# Patient Record
Sex: Male | Born: 1935 | Race: White | Hispanic: No | Marital: Married | Smoking: Never smoker
Health system: Southern US, Community
[De-identification: ages and names within clinical notes are randomized; demographics above are authoritative.]

## PROBLEM LIST (undated history)

## (undated) DIAGNOSIS — R42 Dizziness and giddiness: Secondary | ICD-10-CM

## (undated) DIAGNOSIS — I7090 Unspecified atherosclerosis: Secondary | ICD-10-CM

## (undated) DIAGNOSIS — M4800 Spinal stenosis, site unspecified: Secondary | ICD-10-CM

## (undated) DIAGNOSIS — R739 Hyperglycemia, unspecified: Secondary | ICD-10-CM

## (undated) DIAGNOSIS — I701 Atherosclerosis of renal artery: Secondary | ICD-10-CM

## (undated) DIAGNOSIS — I779 Disorder of arteries and arterioles, unspecified: Secondary | ICD-10-CM

## (undated) DIAGNOSIS — N281 Cyst of kidney, acquired: Secondary | ICD-10-CM

## (undated) DIAGNOSIS — I2510 Atherosclerotic heart disease of native coronary artery without angina pectoris: Secondary | ICD-10-CM

## (undated) DIAGNOSIS — K432 Incisional hernia without obstruction or gangrene: Secondary | ICD-10-CM

## (undated) DIAGNOSIS — C61 Malignant neoplasm of prostate: Secondary | ICD-10-CM

## (undated) DIAGNOSIS — K219 Gastro-esophageal reflux disease without esophagitis: Secondary | ICD-10-CM

## (undated) DIAGNOSIS — I10 Essential (primary) hypertension: Secondary | ICD-10-CM

## (undated) DIAGNOSIS — R911 Solitary pulmonary nodule: Secondary | ICD-10-CM

## (undated) DIAGNOSIS — Z860100 Personal history of colon polyps, unspecified: Secondary | ICD-10-CM

## (undated) DIAGNOSIS — R059 Cough, unspecified: Secondary | ICD-10-CM

## (undated) DIAGNOSIS — K222 Esophageal obstruction: Secondary | ICD-10-CM

## (undated) DIAGNOSIS — R05 Cough: Secondary | ICD-10-CM

## (undated) DIAGNOSIS — L03119 Cellulitis of unspecified part of limb: Secondary | ICD-10-CM

## (undated) DIAGNOSIS — I739 Peripheral vascular disease, unspecified: Secondary | ICD-10-CM

## (undated) DIAGNOSIS — E785 Hyperlipidemia, unspecified: Secondary | ICD-10-CM

## (undated) DIAGNOSIS — R21 Rash and other nonspecific skin eruption: Secondary | ICD-10-CM

## (undated) DIAGNOSIS — K5790 Diverticulosis of intestine, part unspecified, without perforation or abscess without bleeding: Secondary | ICD-10-CM

## (undated) DIAGNOSIS — K769 Liver disease, unspecified: Secondary | ICD-10-CM

## (undated) DIAGNOSIS — IMO0001 Reserved for inherently not codable concepts without codable children: Secondary | ICD-10-CM

## (undated) DIAGNOSIS — I350 Nonrheumatic aortic (valve) stenosis: Secondary | ICD-10-CM

## (undated) DIAGNOSIS — E0590 Thyrotoxicosis, unspecified without thyrotoxic crisis or storm: Secondary | ICD-10-CM

## (undated) DIAGNOSIS — S35239A Unspecified injury of inferior mesenteric artery, initial encounter: Secondary | ICD-10-CM

## (undated) HISTORY — DX: Cough: R05

## (undated) HISTORY — DX: Gastro-esophageal reflux disease without esophagitis: K21.9

## (undated) HISTORY — DX: Hyperglycemia, unspecified: R73.9

## (undated) HISTORY — DX: Essential (primary) hypertension: I10

## (undated) HISTORY — DX: Disorder of arteries and arterioles, unspecified: I77.9

## (undated) HISTORY — DX: Hyperlipidemia, unspecified: E78.5

## (undated) HISTORY — PX: ESOPHAGOGASTRODUODENOSCOPY: SHX1529

## (undated) HISTORY — DX: Solitary pulmonary nodule: R91.1

## (undated) HISTORY — DX: Rash and other nonspecific skin eruption: R21

## (undated) HISTORY — DX: Spinal stenosis, site unspecified: M48.00

## (undated) HISTORY — DX: Unspecified atherosclerosis: I70.90

## (undated) HISTORY — DX: Cellulitis of unspecified part of limb: L03.119

## (undated) HISTORY — DX: Reserved for inherently not codable concepts without codable children: IMO0001

## (undated) HISTORY — PX: INCISIONAL HERNIA REPAIR: SHX193

## (undated) HISTORY — PX: SIGMOID RESECTION / RECTOPEXY: SUR1294

## (undated) HISTORY — DX: Malignant neoplasm of prostate: C61

## (undated) HISTORY — DX: Personal history of colon polyps, unspecified: Z86.0100

## (undated) HISTORY — PX: CARDIAC CATHETERIZATION: SHX172

## (undated) HISTORY — DX: Thyrotoxicosis, unspecified without thyrotoxic crisis or storm: E05.90

## (undated) HISTORY — DX: Cough, unspecified: R05.9

## (undated) HISTORY — DX: Incisional hernia without obstruction or gangrene: K43.2

## (undated) HISTORY — DX: Nonrheumatic aortic (valve) stenosis: I35.0

## (undated) HISTORY — DX: Dizziness and giddiness: R42

## (undated) HISTORY — DX: Personal history of colonic polyps: Z86.010

## (undated) HISTORY — DX: Atherosclerosis of renal artery: I70.1

## (undated) HISTORY — DX: Peripheral vascular disease, unspecified: I73.9

## (undated) HISTORY — DX: Cyst of kidney, acquired: N28.1

## (undated) HISTORY — DX: Liver disease, unspecified: K76.9

## (undated) HISTORY — DX: Unspecified injury of inferior mesenteric artery, initial encounter: S35.239A

## (undated) HISTORY — DX: Esophageal obstruction: K22.2

## (undated) HISTORY — DX: Diverticulosis of intestine, part unspecified, without perforation or abscess without bleeding: K57.90

## (undated) HISTORY — DX: Atherosclerotic heart disease of native coronary artery without angina pectoris: I25.10

---

## 1999-05-06 ENCOUNTER — Ambulatory Visit (HOSPITAL_COMMUNITY): Admission: RE | Admit: 1999-05-06 | Discharge: 1999-05-07 | Payer: Self-pay | Admitting: Cardiology

## 1999-10-19 ENCOUNTER — Encounter: Payer: Self-pay | Admitting: Endocrinology

## 1999-10-19 ENCOUNTER — Ambulatory Visit (HOSPITAL_COMMUNITY): Admission: RE | Admit: 1999-10-19 | Discharge: 1999-10-19 | Payer: Self-pay | Admitting: Endocrinology

## 1999-11-23 ENCOUNTER — Encounter: Payer: Self-pay | Admitting: Endocrinology

## 1999-11-23 ENCOUNTER — Ambulatory Visit (HOSPITAL_COMMUNITY): Admission: RE | Admit: 1999-11-23 | Discharge: 1999-11-23 | Payer: Self-pay | Admitting: Endocrinology

## 2000-09-19 ENCOUNTER — Ambulatory Visit (HOSPITAL_COMMUNITY): Admission: RE | Admit: 2000-09-19 | Discharge: 2000-09-19 | Payer: Self-pay | Admitting: Ophthalmology

## 2001-07-29 ENCOUNTER — Inpatient Hospital Stay (HOSPITAL_COMMUNITY): Admission: RE | Admit: 2001-07-29 | Discharge: 2001-07-30 | Payer: Self-pay | Admitting: General Surgery

## 2002-04-11 ENCOUNTER — Encounter: Payer: Self-pay | Admitting: *Deleted

## 2002-04-11 ENCOUNTER — Inpatient Hospital Stay (HOSPITAL_COMMUNITY): Admission: EM | Admit: 2002-04-11 | Discharge: 2002-04-12 | Payer: Self-pay | Admitting: *Deleted

## 2003-02-15 ENCOUNTER — Encounter: Payer: Self-pay | Admitting: Dentistry

## 2003-02-15 ENCOUNTER — Ambulatory Visit (HOSPITAL_COMMUNITY): Admission: RE | Admit: 2003-02-15 | Discharge: 2003-02-15 | Payer: Self-pay | Admitting: Dentistry

## 2003-04-02 ENCOUNTER — Inpatient Hospital Stay (HOSPITAL_COMMUNITY): Admission: AD | Admit: 2003-04-02 | Discharge: 2003-04-03 | Payer: Self-pay | Admitting: Internal Medicine

## 2003-05-29 ENCOUNTER — Encounter: Admission: RE | Admit: 2003-05-29 | Discharge: 2003-05-29 | Payer: Self-pay | Admitting: Dentistry

## 2003-06-03 ENCOUNTER — Encounter: Admission: RE | Admit: 2003-06-03 | Discharge: 2003-06-03 | Payer: Self-pay | Admitting: Dentistry

## 2003-06-12 ENCOUNTER — Encounter: Admission: RE | Admit: 2003-06-12 | Discharge: 2003-06-12 | Payer: Self-pay | Admitting: Dentistry

## 2003-06-25 ENCOUNTER — Ambulatory Visit (HOSPITAL_COMMUNITY): Admission: RE | Admit: 2003-06-25 | Discharge: 2003-06-25 | Payer: Self-pay | Admitting: Gastroenterology

## 2003-06-27 ENCOUNTER — Ambulatory Visit (HOSPITAL_COMMUNITY): Admission: RE | Admit: 2003-06-27 | Discharge: 2003-06-27 | Payer: Self-pay | Admitting: Gastroenterology

## 2004-05-26 ENCOUNTER — Ambulatory Visit: Payer: Self-pay | Admitting: Endocrinology

## 2004-06-04 ENCOUNTER — Ambulatory Visit: Payer: Self-pay | Admitting: Gastroenterology

## 2004-06-09 ENCOUNTER — Ambulatory Visit: Payer: Self-pay | Admitting: Endocrinology

## 2004-06-30 ENCOUNTER — Ambulatory Visit: Payer: Self-pay | Admitting: Gastroenterology

## 2004-07-28 ENCOUNTER — Ambulatory Visit: Payer: Self-pay | Admitting: Internal Medicine

## 2004-09-23 ENCOUNTER — Ambulatory Visit: Payer: Self-pay | Admitting: Gastroenterology

## 2004-09-23 ENCOUNTER — Ambulatory Visit (HOSPITAL_COMMUNITY): Admission: RE | Admit: 2004-09-23 | Discharge: 2004-09-23 | Payer: Self-pay | Admitting: Gastroenterology

## 2004-12-29 ENCOUNTER — Ambulatory Visit: Payer: Self-pay | Admitting: Gastroenterology

## 2005-01-28 ENCOUNTER — Ambulatory Visit: Payer: Self-pay | Admitting: Endocrinology

## 2005-03-04 ENCOUNTER — Ambulatory Visit: Payer: Self-pay | Admitting: Endocrinology

## 2005-03-16 ENCOUNTER — Ambulatory Visit: Payer: Self-pay | Admitting: Endocrinology

## 2005-04-06 ENCOUNTER — Ambulatory Visit: Payer: Self-pay | Admitting: Endocrinology

## 2005-04-30 ENCOUNTER — Ambulatory Visit: Payer: Self-pay | Admitting: Endocrinology

## 2005-05-20 ENCOUNTER — Ambulatory Visit: Payer: Self-pay | Admitting: Cardiology

## 2005-06-25 ENCOUNTER — Ambulatory Visit: Payer: Self-pay | Admitting: Gastroenterology

## 2005-07-13 ENCOUNTER — Ambulatory Visit: Payer: Self-pay

## 2005-07-13 ENCOUNTER — Ambulatory Visit: Payer: Self-pay | Admitting: Cardiovascular Disease

## 2005-08-13 ENCOUNTER — Ambulatory Visit: Payer: Self-pay | Admitting: Internal Medicine

## 2006-05-02 ENCOUNTER — Ambulatory Visit: Payer: Self-pay | Admitting: Cardiology

## 2006-05-09 ENCOUNTER — Ambulatory Visit: Payer: Self-pay | Admitting: Endocrinology

## 2006-05-23 ENCOUNTER — Ambulatory Visit: Payer: Self-pay | Admitting: Endocrinology

## 2006-05-23 LAB — CONVERTED CEMR LAB
ALT: 36 units/L (ref 0–40)
AST: 33 units/L (ref 0–37)
Albumin: 4.1 g/dL (ref 3.5–5.2)
Alkaline Phosphatase: 63 units/L (ref 39–117)
BUN: 25 mg/dL — ABNORMAL HIGH (ref 6–23)
Basophils Absolute: 0 10*3/uL (ref 0.0–0.1)
Basophils Relative: 0.6 % (ref 0.0–1.0)
Bilirubin Urine: NEGATIVE
CO2: 29 meq/L (ref 19–32)
Calcium: 10 mg/dL (ref 8.4–10.5)
Chloride: 106 meq/L (ref 96–112)
Chol/HDL Ratio, serum: 3.6
Cholesterol: 124 mg/dL (ref 0–200)
Creatinine, Ser: 1.3 mg/dL (ref 0.4–1.5)
Eosinophil percent: 2.2 % (ref 0.0–5.0)
GFR calc non Af Amer: 56 mL/min
Glomerular Filtration Rate, Af Am: 68 mL/min/{1.73_m2}
Glucose, Bld: 101 mg/dL — ABNORMAL HIGH (ref 70–99)
HCT: 49.5 % (ref 39.0–52.0)
HDL: 34.4 mg/dL — ABNORMAL LOW (ref >39.0)
Hemoglobin, Urine: NEGATIVE
Hemoglobin: 16.7 g/dL (ref 13.0–17.0)
Hgb A1c MFr Bld: 5.2 % (ref 4.6–6.0)
LDL Cholesterol: 62 mg/dL (ref 0–99)
Leukocytes, UA: NEGATIVE
Lymphocytes Relative: 29.6 % (ref 12.0–46.0)
MCHC: 33.8 g/dL (ref 30.0–36.0)
MCV: 97.8 fL (ref 78.0–100.0)
Monocytes Absolute: 0.5 10*3/uL (ref 0.2–0.7)
Monocytes Relative: 6.5 % (ref 3.0–11.0)
Neutro Abs: 4.4 10*3/uL (ref 1.4–7.7)
Neutrophils Relative %: 61.1 % (ref 43.0–77.0)
Nitrite: NEGATIVE
PSA: 28.2 ng/mL — ABNORMAL HIGH (ref 0.10–4.00)
Platelets: 173 10*3/uL (ref 150–400)
Potassium: 4.3 meq/L (ref 3.5–5.1)
RBC: 5.06 M/uL (ref 4.22–5.81)
RDW: 11.8 % (ref 11.5–14.6)
Sodium: 141 meq/L (ref 135–145)
Specific Gravity, Urine: 1.025 (ref 1.000–1.03)
TSH: 2.03 microintl units/mL (ref 0.35–5.50)
Total Bilirubin: 1.3 mg/dL — ABNORMAL HIGH (ref 0.3–1.2)
Total Protein, Urine: NEGATIVE mg/dL
Total Protein: 7.2 g/dL (ref 6.0–8.3)
Triglyceride fasting, serum: 137 mg/dL (ref 0–149)
Urine Glucose: NEGATIVE mg/dL
Urobilinogen, UA: 0.2 (ref 0.0–1.0)
VLDL: 27 mg/dL (ref 0–40)
WBC: 7.2 10*3/uL (ref 4.5–10.5)
pH: 6 (ref 5.0–8.0)

## 2006-08-09 ENCOUNTER — Ambulatory Visit: Payer: Self-pay | Admitting: Gastroenterology

## 2006-08-09 LAB — CONVERTED CEMR LAB
BUN: 17 mg/dL (ref 6–23)
Creatinine, Ser: 1.2 mg/dL (ref 0.4–1.5)

## 2006-08-10 ENCOUNTER — Ambulatory Visit (HOSPITAL_COMMUNITY): Admission: RE | Admit: 2006-08-10 | Discharge: 2006-08-10 | Payer: Self-pay | Admitting: Gastroenterology

## 2006-08-15 ENCOUNTER — Ambulatory Visit: Payer: Self-pay | Admitting: Cardiology

## 2006-11-21 ENCOUNTER — Ambulatory Visit: Payer: Self-pay | Admitting: Endocrinology

## 2006-11-21 LAB — CONVERTED CEMR LAB: PSA: 0.21 ng/mL (ref 0.10–4.00)

## 2006-12-05 ENCOUNTER — Ambulatory Visit: Payer: Self-pay | Admitting: Cardiology

## 2006-12-05 ENCOUNTER — Inpatient Hospital Stay (HOSPITAL_COMMUNITY): Admission: RE | Admit: 2006-12-05 | Discharge: 2006-12-06 | Payer: Self-pay | Admitting: Cardiology

## 2006-12-15 ENCOUNTER — Ambulatory Visit: Payer: Self-pay | Admitting: Cardiology

## 2007-01-10 ENCOUNTER — Ambulatory Visit: Payer: Self-pay | Admitting: Gastroenterology

## 2007-01-10 ENCOUNTER — Ambulatory Visit: Payer: Self-pay | Admitting: Oncology

## 2007-01-17 ENCOUNTER — Ambulatory Visit: Payer: Self-pay | Admitting: Gastroenterology

## 2007-01-24 LAB — CBC WITH DIFFERENTIAL/PLATELET
BASO%: 0.1 % (ref 0.0–2.0)
HCT: 36 % — ABNORMAL LOW (ref 38.7–49.9)
LYMPH%: 24.4 % (ref 14.0–48.0)
MCHC: 36.2 g/dL — ABNORMAL HIGH (ref 32.0–35.9)
MCV: 96.7 fL (ref 81.6–98.0)
MONO#: 0.5 10*3/uL (ref 0.1–0.9)
MONO%: 7 % (ref 0.0–13.0)
NEUT%: 62.1 % (ref 40.0–75.0)
Platelets: 187 10*3/uL (ref 145–400)
WBC: 7.1 10*3/uL (ref 4.0–10.0)

## 2007-01-24 LAB — COMPREHENSIVE METABOLIC PANEL
ALT: 26 U/L (ref 0–53)
AST: 27 U/L (ref 0–37)
Alkaline Phosphatase: 68 U/L (ref 39–117)
Potassium: 4.6 mEq/L (ref 3.5–5.3)
Sodium: 142 mEq/L (ref 135–145)
Total Bilirubin: 0.9 mg/dL (ref 0.3–1.2)
Total Protein: 7.3 g/dL (ref 6.0–8.3)

## 2007-01-31 ENCOUNTER — Ambulatory Visit: Payer: Self-pay | Admitting: Endocrinology

## 2007-01-31 LAB — CONVERTED CEMR LAB
Calcium, Total (PTH): 10 mg/dL (ref 8.4–10.5)
Hgb A1c MFr Bld: 5.1 % (ref 4.6–6.0)
PTH: 43.9 pg/mL (ref 14.0–72.0)
TSH: 1.21 microintl units/mL (ref 0.35–5.50)

## 2007-02-06 ENCOUNTER — Ambulatory Visit: Admission: RE | Admit: 2007-02-06 | Discharge: 2007-05-07 | Payer: Self-pay | Admitting: Radiation Oncology

## 2007-03-14 ENCOUNTER — Ambulatory Visit: Payer: Self-pay | Admitting: Cardiology

## 2007-03-30 ENCOUNTER — Ambulatory Visit: Payer: Self-pay | Admitting: Oncology

## 2007-04-03 LAB — PSA: PSA: 0.56 ng/mL (ref 0.10–4.00)

## 2007-04-03 LAB — CBC WITH DIFFERENTIAL/PLATELET
BASO%: 0.5 % (ref 0.0–2.0)
EOS%: 6.1 % (ref 0.0–7.0)
HCT: 35.9 % — ABNORMAL LOW (ref 38.7–49.9)
LYMPH%: 25 % (ref 14.0–48.0)
MCH: 32.9 pg (ref 28.0–33.4)
MCHC: 36.4 g/dL — ABNORMAL HIGH (ref 32.0–35.9)
MCV: 90.3 fL (ref 81.6–98.0)
MONO#: 0.4 10*3/uL (ref 0.1–0.9)
MONO%: 7.1 % (ref 0.0–13.0)
NEUT%: 61.4 % (ref 40.0–75.0)
Platelets: 161 10*3/uL (ref 145–400)

## 2007-04-03 LAB — COMPREHENSIVE METABOLIC PANEL
ALT: 35 U/L (ref 0–53)
CO2: 25 mEq/L (ref 19–32)
Creatinine, Ser: 0.97 mg/dL (ref 0.40–1.50)
Total Bilirubin: 0.9 mg/dL (ref 0.3–1.2)

## 2007-04-06 ENCOUNTER — Encounter: Payer: Self-pay | Admitting: Endocrinology

## 2007-05-08 ENCOUNTER — Ambulatory Visit: Admission: RE | Admit: 2007-05-08 | Discharge: 2007-06-12 | Payer: Self-pay | Admitting: Radiation Oncology

## 2007-05-09 ENCOUNTER — Ambulatory Visit: Payer: Self-pay | Admitting: Internal Medicine

## 2007-05-15 ENCOUNTER — Encounter: Payer: Self-pay | Admitting: Endocrinology

## 2007-05-19 ENCOUNTER — Emergency Department (HOSPITAL_COMMUNITY): Admission: EM | Admit: 2007-05-19 | Discharge: 2007-05-19 | Payer: Self-pay | Admitting: *Deleted

## 2007-05-30 ENCOUNTER — Telehealth (INDEPENDENT_AMBULATORY_CARE_PROVIDER_SITE_OTHER): Payer: Self-pay | Admitting: *Deleted

## 2007-06-12 ENCOUNTER — Ambulatory Visit: Payer: Self-pay | Admitting: Endocrinology

## 2007-06-12 LAB — CONVERTED CEMR LAB
Bacteria, UA: NEGATIVE
Bilirubin Urine: NEGATIVE
Cholesterol: 129 mg/dL (ref 0–200)
Crystals: NEGATIVE
HDL: 39.6 mg/dL (ref >39.0)
Hemoglobin, Urine: NEGATIVE
Ketones, ur: NEGATIVE mg/dL
LDL Cholesterol: 57 mg/dL (ref 0–99)
Leukocytes, UA: NEGATIVE
Mucus, UA: NEGATIVE
Nitrite: NEGATIVE
Specific Gravity, Urine: 1.02 (ref 1.000–1.03)
TSH: 1.74 microintl units/mL (ref 0.35–5.50)
Total CHOL/HDL Ratio: 3.3
Total Protein, Urine: NEGATIVE mg/dL
Triglycerides: 162 mg/dL — ABNORMAL HIGH (ref 0–149)
Urine Glucose: NEGATIVE mg/dL
Urobilinogen, UA: 0.2 (ref 0.0–1.0)
VLDL: 32 mg/dL (ref 0–40)
pH: 6.5 (ref 5.0–8.0)

## 2007-06-13 DIAGNOSIS — E0590 Thyrotoxicosis, unspecified without thyrotoxic crisis or storm: Secondary | ICD-10-CM

## 2007-06-13 DIAGNOSIS — Z860100 Personal history of colon polyps, unspecified: Secondary | ICD-10-CM | POA: Insufficient documentation

## 2007-06-13 DIAGNOSIS — E785 Hyperlipidemia, unspecified: Secondary | ICD-10-CM

## 2007-06-13 DIAGNOSIS — R7309 Other abnormal glucose: Secondary | ICD-10-CM | POA: Insufficient documentation

## 2007-06-13 DIAGNOSIS — I10 Essential (primary) hypertension: Secondary | ICD-10-CM

## 2007-06-13 DIAGNOSIS — M4800 Spinal stenosis, site unspecified: Secondary | ICD-10-CM | POA: Insufficient documentation

## 2007-06-13 DIAGNOSIS — K222 Esophageal obstruction: Secondary | ICD-10-CM

## 2007-06-13 DIAGNOSIS — K219 Gastro-esophageal reflux disease without esophagitis: Secondary | ICD-10-CM | POA: Insufficient documentation

## 2007-06-15 ENCOUNTER — Ambulatory Visit: Payer: Self-pay | Admitting: Endocrinology

## 2007-06-15 DIAGNOSIS — C61 Malignant neoplasm of prostate: Secondary | ICD-10-CM

## 2007-06-27 ENCOUNTER — Ambulatory Visit: Payer: Self-pay | Admitting: Oncology

## 2007-06-29 LAB — CBC WITH DIFFERENTIAL/PLATELET
Basophils Absolute: 0 10*3/uL (ref 0.0–0.1)
HCT: 34.2 % — ABNORMAL LOW (ref 38.7–49.9)
HGB: 12.4 g/dL — ABNORMAL LOW (ref 13.0–17.1)
MCH: 34.5 pg — ABNORMAL HIGH (ref 28.0–33.4)
MONO#: 0.4 10*3/uL (ref 0.1–0.9)
NEUT%: 67.3 % (ref 40.0–75.0)
lymph#: 1.1 10*3/uL (ref 0.9–3.3)

## 2007-06-30 LAB — COMPREHENSIVE METABOLIC PANEL
ALT: 40 U/L (ref 0–53)
AST: 42 U/L — ABNORMAL HIGH (ref 0–37)
Albumin: 4.4 g/dL (ref 3.5–5.2)
Alkaline Phosphatase: 63 U/L (ref 39–117)
BUN: 25 mg/dL — ABNORMAL HIGH (ref 6–23)
Calcium: 9.7 mg/dL (ref 8.4–10.5)
Chloride: 107 mEq/L (ref 96–112)
Potassium: 4.4 mEq/L (ref 3.5–5.3)
Sodium: 140 mEq/L (ref 135–145)

## 2007-07-03 ENCOUNTER — Encounter: Payer: Self-pay | Admitting: Endocrinology

## 2007-07-03 LAB — VITAMIN D PNL(25-HYDRXY+1,25-DIHY)-BLD: Vit D, 25-Hydroxy: 31 ng/mL (ref 30–89)

## 2007-07-05 ENCOUNTER — Encounter: Payer: Self-pay | Admitting: Endocrinology

## 2007-07-12 ENCOUNTER — Telehealth (INDEPENDENT_AMBULATORY_CARE_PROVIDER_SITE_OTHER): Payer: Self-pay | Admitting: *Deleted

## 2007-08-18 ENCOUNTER — Ambulatory Visit: Payer: Self-pay | Admitting: Internal Medicine

## 2007-08-18 ENCOUNTER — Telehealth: Payer: Self-pay | Admitting: Internal Medicine

## 2007-08-18 DIAGNOSIS — R1032 Left lower quadrant pain: Secondary | ICD-10-CM

## 2007-08-18 LAB — CONVERTED CEMR LAB
Basophils Absolute: 0 10*3/uL (ref 0.0–0.1)
Basophils Relative: 0.1 % (ref 0.0–1.0)
Eosinophils Absolute: 0.2 10*3/uL (ref 0.0–0.6)
Eosinophils Relative: 3.3 % (ref 0.0–5.0)
HCT: 36.2 % — ABNORMAL LOW (ref 39.0–52.0)
Hemoglobin: 13.1 g/dL (ref 13.0–17.0)
Lymphocytes Relative: 13.7 % (ref 12.0–46.0)
MCHC: 36.1 g/dL — ABNORMAL HIGH (ref 30.0–36.0)
MCV: 96.6 fL (ref 78.0–100.0)
Monocytes Absolute: 0.5 10*3/uL (ref 0.2–0.7)
Monocytes Relative: 7.4 % (ref 3.0–11.0)
Neutro Abs: 5.2 10*3/uL (ref 1.4–7.7)
Neutrophils Relative %: 75.5 % (ref 43.0–77.0)
Platelets: 143 10*3/uL — ABNORMAL LOW (ref 150–400)
RBC: 3.75 M/uL — ABNORMAL LOW (ref 4.22–5.81)
RDW: 12.1 % (ref 11.5–14.6)
WBC: 6.8 10*3/uL (ref 4.5–10.5)

## 2007-08-22 ENCOUNTER — Encounter: Payer: Self-pay | Admitting: Internal Medicine

## 2007-09-01 ENCOUNTER — Encounter: Payer: Self-pay | Admitting: Endocrinology

## 2007-09-01 ENCOUNTER — Ambulatory Visit: Payer: Self-pay | Admitting: Cardiology

## 2007-09-07 ENCOUNTER — Encounter: Admission: RE | Admit: 2007-09-07 | Discharge: 2007-09-07 | Payer: Self-pay | Admitting: Rheumatology

## 2007-09-12 ENCOUNTER — Encounter: Payer: Self-pay | Admitting: Endocrinology

## 2007-09-14 ENCOUNTER — Ambulatory Visit: Payer: Self-pay | Admitting: Cardiology

## 2007-10-03 ENCOUNTER — Ambulatory Visit: Payer: Self-pay | Admitting: Oncology

## 2007-10-03 LAB — CBC WITH DIFFERENTIAL/PLATELET
BASO%: 0.2 % (ref 0.0–2.0)
EOS%: 4.6 % (ref 0.0–7.0)
MCH: 34.2 pg — ABNORMAL HIGH (ref 28.0–33.4)
MCHC: 36.4 g/dL — ABNORMAL HIGH (ref 32.0–35.9)
MONO#: 0.5 10*3/uL (ref 0.1–0.9)
RBC: 3.67 10*6/uL — ABNORMAL LOW (ref 4.20–5.71)
RDW: 13 % (ref 11.2–14.6)
WBC: 4.2 10*3/uL (ref 4.0–10.0)
lymph#: 0.8 10*3/uL — ABNORMAL LOW (ref 0.9–3.3)

## 2007-10-04 LAB — COMPREHENSIVE METABOLIC PANEL
ALT: 39 U/L (ref 0–53)
AST: 49 U/L — ABNORMAL HIGH (ref 0–37)
Albumin: 4.4 g/dL (ref 3.5–5.2)
CO2: 24 mEq/L (ref 19–32)
Calcium: 9.8 mg/dL (ref 8.4–10.5)
Chloride: 106 mEq/L (ref 96–112)
Creatinine, Ser: 1.05 mg/dL (ref 0.40–1.50)
Potassium: 4.5 mEq/L (ref 3.5–5.3)
Sodium: 141 mEq/L (ref 135–145)
Total Protein: 7 g/dL (ref 6.0–8.3)

## 2007-10-10 ENCOUNTER — Encounter: Payer: Self-pay | Admitting: Endocrinology

## 2007-10-12 ENCOUNTER — Ambulatory Visit (HOSPITAL_COMMUNITY): Admission: RE | Admit: 2007-10-12 | Discharge: 2007-10-12 | Payer: Self-pay | Admitting: Oncology

## 2007-11-14 ENCOUNTER — Encounter: Payer: Self-pay | Admitting: Endocrinology

## 2008-01-15 ENCOUNTER — Encounter: Payer: Self-pay | Admitting: Endocrinology

## 2008-01-15 ENCOUNTER — Encounter: Payer: Self-pay | Admitting: Gastroenterology

## 2008-01-30 ENCOUNTER — Ambulatory Visit: Payer: Self-pay | Admitting: Endocrinology

## 2008-01-31 LAB — CONVERTED CEMR LAB
BUN: 29 mg/dL — ABNORMAL HIGH (ref 6–23)
Basophils Absolute: 0 10*3/uL (ref 0.0–0.1)
Basophils Relative: 0.2 % (ref 0.0–1.0)
CO2: 27 meq/L (ref 19–32)
Calcium: 10.3 mg/dL (ref 8.4–10.5)
Chloride: 102 meq/L (ref 96–112)
Creatinine, Ser: 1.3 mg/dL (ref 0.4–1.5)
Eosinophils Absolute: 0.3 10*3/uL (ref 0.0–0.7)
Eosinophils Relative: 3.4 % (ref 0.0–5.0)
GFR calc Af Amer: 67 mL/min
GFR calc non Af Amer: 56 mL/min
Glucose, Bld: 161 mg/dL — ABNORMAL HIGH (ref 70–99)
HCT: 38.4 % — ABNORMAL LOW (ref 39.0–52.0)
Hemoglobin: 13.7 g/dL (ref 13.0–17.0)
Hgb A1c MFr Bld: 5.7 % (ref 4.6–6.0)
Lymphocytes Relative: 16.8 % (ref 12.0–46.0)
MCHC: 35.6 g/dL (ref 30.0–36.0)
MCV: 97.3 fL (ref 78.0–100.0)
Monocytes Absolute: 0.5 10*3/uL (ref 0.1–1.0)
Monocytes Relative: 6 % (ref 3.0–12.0)
Neutro Abs: 5.5 10*3/uL (ref 1.4–7.7)
Neutrophils Relative %: 73.6 % (ref 43.0–77.0)
Platelets: 228 10*3/uL (ref 150–400)
Potassium: 4.7 meq/L (ref 3.5–5.1)
Pro B Natriuretic peptide (BNP): 30 pg/mL (ref 0.0–100.0)
RBC: 3.95 M/uL — ABNORMAL LOW (ref 4.22–5.81)
RDW: 11.8 % (ref 11.5–14.6)
Sodium: 136 meq/L (ref 135–145)
TSH: 1.64 microintl units/mL (ref 0.35–5.50)
WBC: 7.6 10*3/uL (ref 4.5–10.5)

## 2008-02-05 ENCOUNTER — Encounter: Payer: Self-pay | Admitting: Endocrinology

## 2008-02-13 ENCOUNTER — Encounter: Admission: RE | Admit: 2008-02-13 | Discharge: 2008-02-13 | Payer: Self-pay | Admitting: Orthopedic Surgery

## 2008-03-06 ENCOUNTER — Ambulatory Visit: Payer: Self-pay | Admitting: Cardiology

## 2008-03-22 ENCOUNTER — Ambulatory Visit: Payer: Self-pay | Admitting: Oncology

## 2008-03-26 LAB — COMPREHENSIVE METABOLIC PANEL
Albumin: 4.4 g/dL (ref 3.5–5.2)
Alkaline Phosphatase: 62 U/L (ref 39–117)
BUN: 22 mg/dL (ref 6–23)
CO2: 23 mEq/L (ref 19–32)
Calcium: 10 mg/dL (ref 8.4–10.5)
Chloride: 105 mEq/L (ref 96–112)
Glucose, Bld: 100 mg/dL — ABNORMAL HIGH (ref 70–99)
Potassium: 4.3 mEq/L (ref 3.5–5.3)
Total Protein: 7.2 g/dL (ref 6.0–8.3)

## 2008-03-26 LAB — CBC WITH DIFFERENTIAL/PLATELET
Basophils Absolute: 0 10*3/uL (ref 0.0–0.1)
Eosinophils Absolute: 0.3 10*3/uL (ref 0.0–0.5)
HGB: 12.7 g/dL — ABNORMAL LOW (ref 13.0–17.1)
MCV: 96.6 fL (ref 81.6–98.0)
MONO#: 0.4 10*3/uL (ref 0.1–0.9)
MONO%: 7.5 % (ref 0.0–13.0)
NEUT#: 3.6 10*3/uL (ref 1.5–6.5)
RDW: 13.4 % (ref 11.2–14.6)
WBC: 5.6 10*3/uL (ref 4.0–10.0)

## 2008-03-26 LAB — PSA: PSA: 0.1 ng/mL (ref 0.10–4.00)

## 2008-03-26 LAB — TESTOSTERONE: Testosterone: 180.13 ng/dL — ABNORMAL LOW (ref 350–890)

## 2008-04-02 ENCOUNTER — Encounter: Payer: Self-pay | Admitting: Gastroenterology

## 2008-04-02 ENCOUNTER — Encounter: Payer: Self-pay | Admitting: Endocrinology

## 2008-04-04 ENCOUNTER — Ambulatory Visit: Payer: Self-pay | Admitting: Endocrinology

## 2008-04-04 DIAGNOSIS — K769 Liver disease, unspecified: Secondary | ICD-10-CM | POA: Insufficient documentation

## 2008-04-04 LAB — CONVERTED CEMR LAB
ALT: 38 units/L (ref 0–53)
AST: 48 units/L — ABNORMAL HIGH (ref 0–37)
Albumin: 4.3 g/dL (ref 3.5–5.2)
Alkaline Phosphatase: 67 units/L (ref 39–117)
Bilirubin, Direct: 0.1 mg/dL (ref 0.0–0.3)
HCV Ab: NEGATIVE
Hep B S Ab: NEGATIVE
Hepatitis B Surface Ag: NEGATIVE
Total Bilirubin: 1.1 mg/dL (ref 0.3–1.2)
Total Protein: 7.3 g/dL (ref 6.0–8.3)

## 2008-04-05 ENCOUNTER — Telehealth (INDEPENDENT_AMBULATORY_CARE_PROVIDER_SITE_OTHER): Payer: Self-pay | Admitting: *Deleted

## 2008-04-08 ENCOUNTER — Telehealth: Payer: Self-pay | Admitting: Endocrinology

## 2008-04-12 ENCOUNTER — Ambulatory Visit: Payer: Self-pay | Admitting: Endocrinology

## 2008-04-23 ENCOUNTER — Telehealth: Payer: Self-pay | Admitting: Endocrinology

## 2008-06-07 ENCOUNTER — Encounter: Payer: Self-pay | Admitting: Gastroenterology

## 2008-07-04 ENCOUNTER — Ambulatory Visit: Payer: Self-pay | Admitting: Endocrinology

## 2008-09-09 ENCOUNTER — Ambulatory Visit: Payer: Self-pay | Admitting: Endocrinology

## 2008-09-11 LAB — CONVERTED CEMR LAB
ALT: 40 units/L (ref 0–53)
AST: 46 units/L — ABNORMAL HIGH (ref 0–37)
Albumin: 4.2 g/dL (ref 3.5–5.2)
Alkaline Phosphatase: 74 units/L (ref 39–117)
Bilirubin, Direct: 0.3 mg/dL (ref 0.0–0.3)
Cholesterol: 179 mg/dL (ref 0–200)
Direct LDL: 102.9 mg/dL
HDL: 34 mg/dL — ABNORMAL LOW (ref >39.0)
Hgb A1c MFr Bld: 5.1 % (ref 4.6–6.0)
PSA: 0.17 ng/mL (ref 0.10–4.00)
TSH: 2.16 microintl units/mL (ref 0.35–5.50)
Total Bilirubin: 1.4 mg/dL — ABNORMAL HIGH (ref 0.3–1.2)
Total CHOL/HDL Ratio: 5.3
Total Protein: 7.4 g/dL (ref 6.0–8.3)
Triglycerides: 265 mg/dL (ref 0–149)
VLDL: 53 mg/dL — ABNORMAL HIGH (ref 0–40)

## 2008-09-19 ENCOUNTER — Ambulatory Visit: Payer: Self-pay | Admitting: Oncology

## 2008-09-19 ENCOUNTER — Telehealth (INDEPENDENT_AMBULATORY_CARE_PROVIDER_SITE_OTHER): Payer: Self-pay | Admitting: *Deleted

## 2008-09-23 LAB — CBC WITH DIFFERENTIAL/PLATELET
BASO%: 0.2 % (ref 0.0–2.0)
Basophils Absolute: 0 10*3/uL (ref 0.0–0.1)
HCT: 38.6 % (ref 38.4–49.9)
HGB: 13.6 g/dL (ref 13.0–17.1)
LYMPH%: 23.5 % (ref 14.0–49.0)
MCHC: 35.2 g/dL (ref 32.0–36.0)
MONO#: 0.4 10*3/uL (ref 0.1–0.9)
NEUT%: 66.1 % (ref 39.0–75.0)
Platelets: 170 10*3/uL (ref 140–400)
WBC: 5.2 10*3/uL (ref 4.0–10.3)
lymph#: 1.2 10*3/uL (ref 0.9–3.3)

## 2008-09-24 LAB — COMPREHENSIVE METABOLIC PANEL
BUN: 20 mg/dL (ref 6–23)
CO2: 23 mEq/L (ref 19–32)
Calcium: 9.6 mg/dL (ref 8.4–10.5)
Chloride: 106 mEq/L (ref 96–112)
Creatinine, Ser: 1.09 mg/dL (ref 0.40–1.50)
Glucose, Bld: 84 mg/dL (ref 70–99)
Total Bilirubin: 1 mg/dL (ref 0.3–1.2)

## 2008-09-26 ENCOUNTER — Encounter: Payer: Self-pay | Admitting: Endocrinology

## 2008-09-29 ENCOUNTER — Encounter: Payer: Self-pay | Admitting: Endocrinology

## 2008-10-03 ENCOUNTER — Encounter: Payer: Self-pay | Admitting: Endocrinology

## 2008-12-30 ENCOUNTER — Ambulatory Visit: Payer: Self-pay | Admitting: Endocrinology

## 2008-12-30 ENCOUNTER — Telehealth: Payer: Self-pay | Admitting: Endocrinology

## 2008-12-30 DIAGNOSIS — L02419 Cutaneous abscess of limb, unspecified: Secondary | ICD-10-CM

## 2008-12-30 DIAGNOSIS — L03119 Cellulitis of unspecified part of limb: Secondary | ICD-10-CM

## 2009-01-01 ENCOUNTER — Telehealth (INDEPENDENT_AMBULATORY_CARE_PROVIDER_SITE_OTHER): Payer: Self-pay | Admitting: *Deleted

## 2009-01-16 ENCOUNTER — Telehealth: Payer: Self-pay | Admitting: Gastroenterology

## 2009-03-10 ENCOUNTER — Encounter: Payer: Self-pay | Admitting: Endocrinology

## 2009-03-11 ENCOUNTER — Ambulatory Visit: Payer: Self-pay | Admitting: Endocrinology

## 2009-03-25 LAB — CONVERTED CEMR LAB
ALT: 43 units/L (ref 0–53)
AST: 51 units/L — ABNORMAL HIGH (ref 0–37)
Albumin: 4.1 g/dL (ref 3.5–5.2)
Alkaline Phosphatase: 87 units/L (ref 39–117)
Bilirubin, Direct: 0.2 mg/dL (ref 0.0–0.3)
Cholesterol: 173 mg/dL (ref 0–200)
Direct LDL: 103.2 mg/dL
HDL: 33.2 mg/dL — ABNORMAL LOW (ref >39.00)
TSH: 2.64 microintl units/mL (ref 0.35–5.50)
Total Bilirubin: 1.4 mg/dL — ABNORMAL HIGH (ref 0.3–1.2)
Total CHOL/HDL Ratio: 5
Total Protein: 7.3 g/dL (ref 6.0–8.3)
Triglycerides: 310 mg/dL — ABNORMAL HIGH (ref 0.0–149.0)
VLDL: 62 mg/dL — ABNORMAL HIGH (ref 0.0–40.0)

## 2009-04-22 ENCOUNTER — Ambulatory Visit: Payer: Self-pay | Admitting: Oncology

## 2009-04-24 LAB — CBC WITH DIFFERENTIAL/PLATELET
BASO%: 0.3 % (ref 0.0–2.0)
LYMPH%: 22.6 % (ref 14.0–49.0)
MCHC: 36 g/dL (ref 32.0–36.0)
MCV: 93.1 fL (ref 79.3–98.0)
MONO%: 7 % (ref 0.0–14.0)
NEUT%: 66 % (ref 39.0–75.0)
Platelets: 187 10*3/uL (ref 140–400)
RBC: 4.24 10*6/uL (ref 4.20–5.82)

## 2009-04-24 LAB — COMPREHENSIVE METABOLIC PANEL
ALT: 31 U/L (ref 0–53)
Alkaline Phosphatase: 89 U/L (ref 39–117)
Creatinine, Ser: 1.34 mg/dL (ref 0.40–1.50)
Glucose, Bld: 106 mg/dL — ABNORMAL HIGH (ref 70–99)
Sodium: 138 mEq/L (ref 135–145)
Total Bilirubin: 1.2 mg/dL (ref 0.3–1.2)
Total Protein: 7 g/dL (ref 6.0–8.3)

## 2009-05-01 ENCOUNTER — Encounter: Payer: Self-pay | Admitting: Endocrinology

## 2009-05-06 ENCOUNTER — Ambulatory Visit: Payer: Self-pay | Admitting: Endocrinology

## 2009-05-09 ENCOUNTER — Encounter: Payer: Self-pay | Admitting: Endocrinology

## 2009-07-07 ENCOUNTER — Encounter: Payer: Self-pay | Admitting: Endocrinology

## 2009-07-08 ENCOUNTER — Telehealth: Payer: Self-pay | Admitting: Gastroenterology

## 2009-07-23 ENCOUNTER — Ambulatory Visit: Payer: Self-pay | Admitting: Oncology

## 2009-07-28 LAB — CBC WITH DIFFERENTIAL/PLATELET
BASO%: 0.2 % (ref 0.0–2.0)
Basophils Absolute: 0 10*3/uL (ref 0.0–0.1)
EOS%: 3.3 % (ref 0.0–7.0)
Eosinophils Absolute: 0.2 10*3/uL (ref 0.0–0.5)
HCT: 42.9 % (ref 38.4–49.9)
HGB: 15.1 g/dL (ref 13.0–17.1)
LYMPH%: 22.2 % (ref 14.0–49.0)
MCH: 33.7 pg — ABNORMAL HIGH (ref 27.2–33.4)
MCHC: 35.3 g/dL (ref 32.0–36.0)
MCV: 95.6 fL (ref 79.3–98.0)
MONO#: 0.5 10*3/uL (ref 0.1–0.9)
MONO%: 7.2 % (ref 0.0–14.0)
NEUT#: 4.3 10*3/uL (ref 1.5–6.5)
NEUT%: 67.1 % (ref 39.0–75.0)
Platelets: 208 10*3/uL (ref 140–400)
RBC: 4.49 10*6/uL (ref 4.20–5.82)
RDW: 12.5 % (ref 11.0–14.6)
WBC: 6.4 10*3/uL (ref 4.0–10.3)
lymph#: 1.4 10*3/uL (ref 0.9–3.3)

## 2009-07-29 ENCOUNTER — Encounter: Payer: Self-pay | Admitting: Endocrinology

## 2009-07-29 LAB — COMPREHENSIVE METABOLIC PANEL
ALT: 35 U/L (ref 0–53)
AST: 41 U/L — ABNORMAL HIGH (ref 0–37)
Albumin: 4.5 g/dL (ref 3.5–5.2)
Alkaline Phosphatase: 88 U/L (ref 39–117)
BUN: 22 mg/dL (ref 6–23)
CO2: 26 mEq/L (ref 19–32)
Calcium: 9.8 mg/dL (ref 8.4–10.5)
Chloride: 103 mEq/L (ref 96–112)
Creatinine, Ser: 1.27 mg/dL (ref 0.40–1.50)
Glucose, Bld: 79 mg/dL (ref 70–99)
Potassium: 4.4 mEq/L (ref 3.5–5.3)
Sodium: 139 mEq/L (ref 135–145)
Total Bilirubin: 0.8 mg/dL (ref 0.3–1.2)
Total Protein: 6.9 g/dL (ref 6.0–8.3)

## 2009-07-29 LAB — PSA: PSA: 2.34 ng/mL (ref 0.10–4.00)

## 2009-08-04 ENCOUNTER — Encounter: Payer: Self-pay | Admitting: Endocrinology

## 2009-09-01 ENCOUNTER — Encounter: Payer: Self-pay | Admitting: Endocrinology

## 2009-10-02 ENCOUNTER — Ambulatory Visit: Payer: Self-pay | Admitting: Oncology

## 2009-10-06 LAB — CBC WITH DIFFERENTIAL/PLATELET
Basophils Absolute: 0 10*3/uL (ref 0.0–0.1)
EOS%: 3.1 % (ref 0.0–7.0)
Eosinophils Absolute: 0.2 10*3/uL (ref 0.0–0.5)
LYMPH%: 23 % (ref 14.0–49.0)
MCH: 34.1 pg — ABNORMAL HIGH (ref 27.2–33.4)
MCV: 95.5 fL (ref 79.3–98.0)
MONO%: 5.6 % (ref 0.0–14.0)
NEUT#: 4.6 10*3/uL (ref 1.5–6.5)
Platelets: 194 10*3/uL (ref 140–400)
RBC: 4.42 10*6/uL (ref 4.20–5.82)
RDW: 12.6 % (ref 11.0–14.6)

## 2009-10-06 LAB — COMPREHENSIVE METABOLIC PANEL
AST: 39 U/L — ABNORMAL HIGH (ref 0–37)
Alkaline Phosphatase: 92 U/L (ref 39–117)
BUN: 21 mg/dL (ref 6–23)
Glucose, Bld: 81 mg/dL (ref 70–99)
Total Bilirubin: 0.9 mg/dL (ref 0.3–1.2)

## 2009-11-25 ENCOUNTER — Telehealth: Payer: Self-pay | Admitting: Endocrinology

## 2009-11-27 ENCOUNTER — Encounter: Payer: Self-pay | Admitting: Cardiology

## 2009-11-27 DIAGNOSIS — R42 Dizziness and giddiness: Secondary | ICD-10-CM

## 2009-11-28 ENCOUNTER — Ambulatory Visit: Payer: Self-pay | Admitting: Cardiology

## 2009-12-23 ENCOUNTER — Ambulatory Visit: Payer: Self-pay | Admitting: Oncology

## 2009-12-24 LAB — COMPREHENSIVE METABOLIC PANEL
ALT: 30 U/L (ref 0–53)
AST: 35 U/L (ref 0–37)
Albumin: 4.3 g/dL (ref 3.5–5.2)
Alkaline Phosphatase: 84 U/L (ref 39–117)
BUN: 20 mg/dL (ref 6–23)
Calcium: 9.5 mg/dL (ref 8.4–10.5)
Chloride: 104 mEq/L (ref 96–112)
Potassium: 4.4 mEq/L (ref 3.5–5.3)
Sodium: 138 mEq/L (ref 135–145)

## 2009-12-24 LAB — CBC WITH DIFFERENTIAL/PLATELET
BASO%: 0.3 % (ref 0.0–2.0)
EOS%: 4 % (ref 0.0–7.0)
HGB: 15 g/dL (ref 13.0–17.1)
MCH: 33.3 pg (ref 27.2–33.4)
MCHC: 35.4 g/dL (ref 32.0–36.0)
MCV: 94.2 fL (ref 79.3–98.0)
MONO%: 7.3 % (ref 0.0–14.0)
RBC: 4.5 10*6/uL (ref 4.20–5.82)
RDW: 12.9 % (ref 11.0–14.6)
lymph#: 1.5 10*3/uL (ref 0.9–3.3)

## 2010-04-03 ENCOUNTER — Ambulatory Visit: Payer: Self-pay | Admitting: Oncology

## 2010-04-07 LAB — CBC WITH DIFFERENTIAL/PLATELET
BASO%: 0.3 % (ref 0.0–2.0)
HCT: 44.5 % (ref 38.4–49.9)
HGB: 15.6 g/dL (ref 13.0–17.1)
MCHC: 35.1 g/dL (ref 32.0–36.0)
MONO#: 0.5 10*3/uL (ref 0.1–0.9)
NEUT%: 65.9 % (ref 39.0–75.0)
RDW: 12.9 % (ref 11.0–14.6)
WBC: 6.9 10*3/uL (ref 4.0–10.3)
lymph#: 1.6 10*3/uL (ref 0.9–3.3)

## 2010-04-07 LAB — PSA: PSA: 8.12 ng/mL — ABNORMAL HIGH (ref 0.10–4.00)

## 2010-04-07 LAB — COMPREHENSIVE METABOLIC PANEL
ALT: 32 U/L (ref 0–53)
AST: 36 U/L (ref 0–37)
Albumin: 4.3 g/dL (ref 3.5–5.2)
CO2: 23 mEq/L (ref 19–32)
Calcium: 9.9 mg/dL (ref 8.4–10.5)
Chloride: 106 mEq/L (ref 96–112)
Creatinine, Ser: 1.23 mg/dL (ref 0.40–1.50)
Potassium: 4.3 mEq/L (ref 3.5–5.3)
Total Protein: 6.9 g/dL (ref 6.0–8.3)

## 2010-04-14 ENCOUNTER — Ambulatory Visit (HOSPITAL_COMMUNITY): Admission: RE | Admit: 2010-04-14 | Discharge: 2010-04-14 | Payer: Self-pay | Admitting: Oncology

## 2010-04-14 ENCOUNTER — Encounter: Payer: Self-pay | Admitting: Endocrinology

## 2010-04-16 ENCOUNTER — Telehealth: Payer: Self-pay | Admitting: Endocrinology

## 2010-04-16 ENCOUNTER — Ambulatory Visit: Payer: Self-pay | Admitting: Endocrinology

## 2010-04-20 ENCOUNTER — Ambulatory Visit: Payer: Self-pay | Admitting: Endocrinology

## 2010-04-20 LAB — CONVERTED CEMR LAB
ALT: 34 units/L (ref 0–53)
AST: 38 units/L — ABNORMAL HIGH (ref 0–37)
Albumin: 4.1 g/dL (ref 3.5–5.2)
Alkaline Phosphatase: 87 units/L (ref 39–117)
BUN: 20 mg/dL (ref 6–23)
Basophils Absolute: 0 10*3/uL (ref 0.0–0.1)
Basophils Relative: 0.2 % (ref 0.0–3.0)
Bilirubin Urine: NEGATIVE
Bilirubin, Direct: 0.2 mg/dL (ref 0.0–0.3)
CO2: 31 meq/L (ref 19–32)
Calcium: 9.8 mg/dL (ref 8.4–10.5)
Chloride: 103 meq/L (ref 96–112)
Cholesterol: 171 mg/dL (ref 0–200)
Creatinine, Ser: 1.3 mg/dL (ref 0.4–1.5)
Direct LDL: 96.2 mg/dL
Eosinophils Absolute: 0.3 10*3/uL (ref 0.0–0.7)
Eosinophils Relative: 3.9 % (ref 0.0–5.0)
GFR calc non Af Amer: 53.52 mL/min (ref >60)
Glucose, Bld: 94 mg/dL (ref 70–99)
HCT: 43.8 % (ref 39.0–52.0)
HDL: 35.8 mg/dL — ABNORMAL LOW (ref >39.00)
Hemoglobin: 15.5 g/dL (ref 13.0–17.0)
Hgb A1c MFr Bld: 5.4 % (ref 4.6–6.5)
Leukocytes, UA: NEGATIVE
Lymphocytes Relative: 20.8 % (ref 12.0–46.0)
Lymphs Abs: 1.5 10*3/uL (ref 0.7–4.0)
MCHC: 35.4 g/dL (ref 30.0–36.0)
MCV: 95.5 fL (ref 78.0–100.0)
Monocytes Absolute: 0.5 10*3/uL (ref 0.1–1.0)
Monocytes Relative: 6.8 % (ref 3.0–12.0)
Neutro Abs: 4.8 10*3/uL (ref 1.4–7.7)
Neutrophils Relative %: 68.3 % (ref 43.0–77.0)
Nitrite: NEGATIVE
Platelets: 196 10*3/uL (ref 150.0–400.0)
Potassium: 4.8 meq/L (ref 3.5–5.1)
RBC: 4.59 M/uL (ref 4.22–5.81)
RDW: 12.4 % (ref 11.5–14.6)
Sodium: 139 meq/L (ref 135–145)
Specific Gravity, Urine: >=1.03 (ref 1.000–1.030)
TSH: 2.85 microintl units/mL (ref 0.35–5.50)
Total Bilirubin: 0.9 mg/dL (ref 0.3–1.2)
Total CHOL/HDL Ratio: 5
Total Protein, Urine: NEGATIVE mg/dL
Total Protein: 6.8 g/dL (ref 6.0–8.3)
Triglycerides: 341 mg/dL — ABNORMAL HIGH (ref 0.0–149.0)
Urine Glucose: NEGATIVE mg/dL
Urobilinogen, UA: 0.2 (ref 0.0–1.0)
VLDL: 68.2 mg/dL — ABNORMAL HIGH (ref 0.0–40.0)
WBC: 7.1 10*3/uL (ref 4.5–10.5)
pH: 5.5 (ref 5.0–8.0)

## 2010-04-27 ENCOUNTER — Ambulatory Visit (HOSPITAL_COMMUNITY): Admission: RE | Admit: 2010-04-27 | Discharge: 2010-04-27 | Payer: Self-pay | Admitting: Oncology

## 2010-04-28 ENCOUNTER — Encounter: Payer: Self-pay | Admitting: Endocrinology

## 2010-06-29 ENCOUNTER — Ambulatory Visit: Payer: Self-pay | Admitting: Endocrinology

## 2010-06-29 DIAGNOSIS — J209 Acute bronchitis, unspecified: Secondary | ICD-10-CM

## 2010-07-03 ENCOUNTER — Ambulatory Visit (HOSPITAL_BASED_OUTPATIENT_CLINIC_OR_DEPARTMENT_OTHER): Payer: Medicare Other | Admitting: Oncology

## 2010-07-07 ENCOUNTER — Encounter: Payer: Self-pay | Admitting: Endocrinology

## 2010-07-07 LAB — CBC WITH DIFFERENTIAL/PLATELET
BASO%: 0.3 % (ref 0.0–2.0)
Basophils Absolute: 0 10*3/uL (ref 0.0–0.1)
EOS%: 5.4 % (ref 0.0–7.0)
HCT: 35.9 % — ABNORMAL LOW (ref 38.4–49.9)
MCH: 33.6 pg — ABNORMAL HIGH (ref 27.2–33.4)
MCHC: 35.4 g/dL (ref 32.0–36.0)
MCV: 94.8 fL (ref 79.3–98.0)
MONO%: 5.2 % (ref 0.0–14.0)
NEUT%: 66.5 % (ref 39.0–75.0)
RDW: 12.9 % (ref 11.0–14.6)
lymph#: 1.3 10*3/uL (ref 0.9–3.3)

## 2010-07-07 LAB — COMPREHENSIVE METABOLIC PANEL
ALT: 38 U/L (ref 0–53)
AST: 40 U/L — ABNORMAL HIGH (ref 0–37)
Alkaline Phosphatase: 82 U/L (ref 39–117)
BUN: 25 mg/dL — ABNORMAL HIGH (ref 6–23)
Calcium: 9.9 mg/dL (ref 8.4–10.5)
Chloride: 104 mEq/L (ref 96–112)
Creatinine, Ser: 1.28 mg/dL (ref 0.40–1.50)

## 2010-08-27 NOTE — Assessment & Plan Note (Signed)
Summary: cough/cd   Vital Signs:  Patient profile:   77 year old male Height:      70 inches (177.80 cm) Weight:      166 pounds (75.45 kg) BMI:     23.90 O2 Sat:      95 % on Room air Temp:     97.9 degrees F (36.61 degrees C) oral Pulse rate:   71 / minute BP sitting:   110 / 54  (left arm) Cuff size:   regular  Vitals Entered By: Brenton Grills CMA Duncan Dull) (June 29, 2010 1:15 PM)  O2 Flow:  Room air CC: Cough x 4 days/aj Is Patient Diabetic? No   Primary Provider:  ellison  CC:  Cough x 4 days/aj.  History of Present Illness: pt states 4 days of prod-quality cough.  he has started azithromycin, and advair, for this.  he had assoc sore throat, but this is resolved.    Current Medications (verified): 1)  Metoprolol Tartrate 50 Mg  Tabs (Metoprolol Tartrate) .... Take 1 Tablet By Mouth Two Times A Day 2)  Nitroglycerin 0.4 Mg  Subl (Nitroglycerin) .... As Needed 3)  Colestipol Hcl 1 Gm Tabs (Colestipol Hcl) .... 5 Tabs Qd 4)  Tapazole 5 Mg Tabs (Methimazole) .Marland Kitchen.. 1 Tab Three Times Weekly (M,w,f) 5)  Meclizine Hcl 25 Mg Tabs (Meclizine Hcl) .... One Tab Every 8 Hours As Necessary 6)  Pantoprazole Sodium 40 Mg Tbec (Pantoprazole Sodium) .Marland Kitchen.. 1 By Mouth Once Daily 7)  Benicar 5 Mg Tabs (Olmesartan Medoxomil) .... 2 Tabs Daily 8)  Aspirin 81 Mg Tbec (Aspirin) .... Take One Tablet By Mouth Daily 9)  Calcium Carbonate-Vitamin D 600-400 Mg-Unit  Tabs (Calcium Carbonate-Vitamin D) .... Two Times A Day  Allergies (verified): 1)  Drs Choice Diabetic Bandages (Wound Dressings) 2)  Lisinopril (Lisinopril) 3)  Ticlid  Past History:  Past Medical History: Last updated: 11/27/2009 CAD   PCI in the past  /  last catheter in May, 2008 medical therapy HYPERTENSION  HYPERLIPIDEMIA  CELLULITIS, LEG, LEFT  UNSPECIFIED DISORDER OF LIVER  PULMONARY NODULE, RIGHT UPPER LOBE ...2008  /  unchanged... CT scan.... July, 2009 and ABDOMINAL PAIN, LEFT LOWER QUADRANT  ADENOCARCINOMA,  PROSTATE ---  XRT '08 ESOPHAGEAL STRICTURE  SPINAL STENOSIS  HYPERGLYCEMIA HYPERTHYROIDISM   long-term PTU... Dr.Ellison GERD  COLONIC POLYPS, HX OF  renal artery stenosis.... mild.. Ticlid rash.... tolerates Plavix Hip discomfort..... not vascular Ruptured gallbladder.... cholecystectomy Cough.... ACE inhibitor.... he tolerates ARB Carotid artery disease.... Doppler.... 2006... minimal Atheroma.Marland Kitchen abdominal aorta... no penetrating ulcer... Inferior mesenteric artery.... high-grade stenosis.... CT scan... 2006 Hyperdense cyst.... lower pole left kidney Vertigo  S/P Ruptured diverticulum-1998 Incisional hernia EGD-01/17/2007 Cardiac Cath-12/05/2006 gb 2003  Review of Systems  The patient denies fever and dyspnea on exertion.    Physical Exam  General:  normal appearance.   Head:  head: no deformity eyes: no periorbital swelling, no proptosis external nose and ears are normal mouth: no lesion seen Ears:  TM's intact and clear with normal canals with grossly normal hearing.   Lungs:  Clear to auscultation bilaterally. Normal respiratory effort.    Impression & Recommendations:  Problem # 1:  ACUTE BRONCHITIS (ICD-466.0) Assessment New  Medications Added to Medication List This Visit: 1)  Promethazine-codeine 6.25-10 Mg/65ml Syrp (Promethazine-codeine) .Marland Kitchen.. 1 teaspoon every 4 hrs as needed for cough 2)  Azithromycin 500 Mg Tabs (Azithromycin) .Marland Kitchen.. 1 tab once daily 3)  Advair Diskus 100-50 Mcg/dose Aepb (Fluticasone-salmeterol) .Marland Kitchen.. 1 puff two times  a day  Other Orders: Est. Patient Level III ([address])  Patient Instructions: 1)  continue azithromycin 500 mg once daily, x 6 days total. 2)  promethazine-codeine syrup, as needed for cough Prescriptions: ADVAIR DISKUS 100-50 MCG/DOSE AEPB (FLUTICASONE-SALMETEROL) 1 puff two times a day  #1 device x 2   Entered and Authorized by:   Minus Breeding MD   Signed by:   Minus Breeding MD on 06/29/2010   Method used:    Electronically to        [facility]       [address]       [place], [place]  [address]       Ph: [phone] or [phone]       Fax: [phone]   RxID:   [id number] AZITHROMYCIN 500 MG TABS (AZITHROMYCIN) 1 tab once daily  #6 x 0   Entered and Authorized by:   Minus Breeding MD   Signed by:   Minus Breeding MD on 06/29/2010   Method used:   Electronically to        [facility] ([facility]       [address]       [place], [place]  [address]       Ph: [phone] or [phone]       Fax: [phone]   RxID:   [id number] PROMETHAZINE-CODEINE 6.25-10 MG/5ML SYRP (PROMETHAZINE-CODEINE) 1 teaspoon every 4 hrs as needed for cough  #8 oz x 1   Entered and Authorized by:   Minus Breeding MD   Signed by:   Minus Breeding MD on 06/29/2010   Method used:   Print then Give to Patient   RxID:   [id number]    Orders Added: 1)  Est. Patient Level III [517[address]]

## 2010-08-27 NOTE — Letter (Signed)
[facility]   Imported By: Roderic Ovens 08/22/2009 16:24:42  _____________________________________________________________________  External Attachment:    Type:   Image     Comment:   External Document

## 2010-08-27 NOTE — Assessment & Plan Note (Signed)
Summary: follow up for rx refill-lb   Vital Signs:  Patient profile:   77 year old male Height:      70 inches (177.80 cm) Weight:      166.75 pounds (75.80 kg) BMI:     24.01 O2 Sat:      97 % on Room air Temp:     96.9 degrees F (36.06 degrees C) oral Pulse rate:   66 / minute BP sitting:   128 / 72  (left arm) Cuff size:   regular  Vitals Entered By: Brenton Grills MA (April 20, 2010 3:03 PM)  O2 Flow:  Room air CC: Follow up to refill medications/aj   Primary Provider:  ellison  CC:  Follow up to refill medications/aj.  History of Present Illness: the status of at least 3 ongoing medical problems is addressed today: dyslipidemia:  he takes colestipol as rx'ed.  no weight gain hyperthyroidism:  he takes tapazole as rx'ed.  no fever. liver disorder:  no abd pain.  as w/u has been neg, nash is presumed cause   Current Medications (verified): 1)  Metoprolol Tartrate 50 Mg  Tabs (Metoprolol Tartrate) .... Take 1 Tablet By Mouth Two Times A Day 2)  Nitroglycerin 0.4 Mg  Subl (Nitroglycerin) .... As Needed 3)  Colestipol Hcl 1 Gm Tabs (Colestipol Hcl) .... 5 Tabs Qd 4)  Tapazole 5 Mg Tabs (Methimazole) .Marland Kitchen.. 1 Tab Three Times Weekly (M,w,f) 5)  Meclizine Hcl 25 Mg Tabs (Meclizine Hcl) .... One Tab Every 8 Hours As Necessary 6)  Pantoprazole Sodium 40 Mg Tbec (Pantoprazole Sodium) .Marland Kitchen.. 1 By Mouth Once Daily 7)  Benicar 5 Mg Tabs (Olmesartan Medoxomil) .... 2 Tabs Daily 8)  Aspirin 81 Mg Tbec (Aspirin) .... Take One Tablet By Mouth Daily 9)  Calcium Carbonate-Vitamin D 600-400 Mg-Unit  Tabs (Calcium Carbonate-Vitamin D) .... Two Times A Day  Allergies (verified): 1)  Drs Choice Diabetic Bandages (Wound Dressings) 2)  Lisinopril (Lisinopril) 3)  Ticlid  Past History:  Past Medical History: Last updated: 11/27/2009 CAD   PCI in the past  /  last catheter in May, 2008 medical therapy HYPERTENSION  HYPERLIPIDEMIA  CELLULITIS, LEG, LEFT  UNSPECIFIED DISORDER OF LIVER    PULMONARY NODULE, RIGHT UPPER LOBE ...2008  /  unchanged... CT scan.... July, 2009 and ABDOMINAL PAIN, LEFT LOWER QUADRANT  ADENOCARCINOMA, PROSTATE ---  XRT '08 ESOPHAGEAL STRICTURE  SPINAL STENOSIS  HYPERGLYCEMIA HYPERTHYROIDISM   long-term PTU... Dr.Ellison GERD  COLONIC POLYPS, HX OF  renal artery stenosis.... mild.. Ticlid rash.... tolerates Plavix Hip discomfort..... not vascular Ruptured gallbladder.... cholecystectomy Cough.... ACE inhibitor.... he tolerates ARB Carotid artery disease.... Doppler.... 2006... minimal Atheroma.Marland Kitchen abdominal aorta... no penetrating ulcer... Inferior mesenteric artery.... high-grade stenosis.... CT scan... 2006 Hyperdense cyst.... lower pole left kidney Vertigo  S/P Ruptured diverticulum-1998 Incisional hernia EGD-01/17/2007 Cardiac Cath-12/05/2006 gb 2003  Review of Systems  The patient denies weight loss, chest pain, syncope, dyspnea on exertion, prolonged cough, hematochezia, and hematuria.    Physical Exam  General:  normal appearance.   Head:  head: no deformity eyes: no periorbital swelling, no proptosis external nose and ears are normal mouth: no lesion seen Neck:  Supple without thyroid enlargement or tenderness.  Lungs:  Clear to auscultation bilaterally. Normal respiratory effort.  Heart:  Regular rate and rhythm without murmurs or gallops noted. Normal S1,S2.   Pulses:  dorsalis pedis intact bilat.   Extremities:  no deformity.  no ulcer on the feet.  feet are of normal color and  temp.  no edema there is dry skin on the feet mycotic toenails.   Neurologic:  sensation is intact to touch on the feet  Additional Exam:  Cholesterol LDL   96.2 mg/dL FastTSH                   2.85 uIU/mL              Impression & Recommendations:  Problem # 1:  HYPERLIPIDEMIA (ICD-272.4) well-controlled  Problem # 2:  UNSPECIFIED DISORDER OF LIVER (ICD-573.9) Assessment: Improved prob nash  Problem # 3:  HYPERTHYROIDISM  (ICD-242.90) well-controlled  Other Orders: TLB-Lipid Panel (80061-LIPID) TLB-BMP (Basic Metabolic Panel-BMET) (80048-METABOL) TLB-CBC Platelet - w/Differential (85025-CBCD) TLB-Hepatic/Liver Function Pnl (80076-HEPATIC) TLB-TSH (Thyroid Stimulating Hormone) (84443-TSH) TLB-Udip w/ Micro (81001-URINE) TLB-A1C / Hgb A1C (Glycohemoglobin) (83036-A1C) Est. Patient Level IV ([address])  Patient Instructions: 1)  tests are being ordered for you today.  a few days after the test(s), please call [phone] to hear your test results.  this is very important to do because the results out treatment plan. 2)  return 6 mos. 3)  please consider these measures for your health:  minimize alcohol.  do not use tobacco products.  have a colonoscopy at least every 10 years from age 32.  keep firearms safely stored.  always use seat belts.  have working smoke alarms in your home.  see an eye doctor and dentist regularly.  never drive under the influence of alcohol or drugs (including prescription drugs).  those with fair skin should take precautions against the sun. 4)  please let me know what your wishes would be, if artificial life support measures should become necessary.  it is critically important to prevent falling down (keep floor areas well-lit, dry, and free of loose objects). Prescriptions: COLESTIPOL HCL 1 GM TABS (COLESTIPOL HCL) 5 tabs qd  #450 Tablet x 11   Entered and Authorized by:   Minus Breeding MD   Signed by:   Minus Breeding MD on 04/20/2010   Method used:   Electronically to        [facility]       [address]       [place], [place]  [address]       Ph: [phone] or [phone]       Fax: [phone]   RxID:   [id number]

## 2010-08-27 NOTE — Letter (Signed)
Summary: [facility]  [facility]   Imported By: Sherian Rein 05/07/2010 15:16:24  _____________________________________________________________________  External Attachment:    Type:   Image     Comment:   External Document

## 2010-08-27 NOTE — Assessment & Plan Note (Signed)
Summary: f2y/jss  Medications Added ASPIRIN 81 MG TBEC (ASPIRIN) Take one tablet by mouth daily CALCIUM CARBONATE-VITAMIN D 600-400 MG-UNIT  TABS (CALCIUM CARBONATE-VITAMIN D) two times a day      Allergies Added:   Visit Type:  Follow-up Primary Provider:  ellison  CC:  CAD.  History of Present Illness: The patient is seen for followup of coronary artery disease.  He is doing extremely well.  He is not having any chest pains.  He is not having shortness of breath syncope or presyncope.  Current Medications (verified): 1)  Metoprolol Tartrate 50 Mg  Tabs (Metoprolol Tartrate) .... Take 1 Tablet By Mouth Two Times A Day 2)  Nitroglycerin 0.4 Mg  Subl (Nitroglycerin) .... As Needed 3)  Colestipol Hcl 1 Gm Tabs (Colestipol Hcl) .... 5 Tabs Qd 4)  Tapazole 5 Mg Tabs (Methimazole) .Marland Kitchen.. 1 Tab Three Times Weekly (M,w,f) 5)  Meclizine Hcl 25 Mg Tabs (Meclizine Hcl) .... One Tab Every 8 Hours As Necessary 6)  Pantoprazole Sodium 40 Mg Tbec (Pantoprazole Sodium) .Marland Kitchen.. 1 By Mouth Once Daily 7)  Benicar 5 Mg Tabs (Olmesartan Medoxomil) .... 2 Tabs Daily 8)  Aspirin 81 Mg Tbec (Aspirin) .... Take One Tablet By Mouth Daily 9)  Calcium Carbonate-Vitamin D 600-400 Mg-Unit  Tabs (Calcium Carbonate-Vitamin D) .... Two Times A Day  Allergies (verified): 1)  Drs Choice Diabetic Bandages (Wound Dressings) 2)  Lisinopril (Lisinopril) 3)  Ticlid  Past History:  Past Medical History: Last updated: 11/27/2009 CAD   PCI in the past  /  last catheter in May, 2008 medical therapy HYPERTENSION  HYPERLIPIDEMIA  CELLULITIS, LEG, LEFT  UNSPECIFIED DISORDER OF LIVER  PULMONARY NODULE, RIGHT UPPER LOBE ...2008  /  unchanged... CT scan.... July, 2009 and ABDOMINAL PAIN, LEFT LOWER QUADRANT  ADENOCARCINOMA, PROSTATE ---  XRT '08 ESOPHAGEAL STRICTURE  SPINAL STENOSIS  HYPERGLYCEMIA HYPERTHYROIDISM   long-term PTU... Dr.Ellison GERD  COLONIC POLYPS, HX OF  renal artery stenosis.... mild.. Ticlid  rash.... tolerates Plavix Hip discomfort..... not vascular Ruptured gallbladder.... cholecystectomy Cough.... ACE inhibitor.... he tolerates ARB Carotid artery disease.... Doppler.... 2006... minimal Atheroma.Marland Kitchen abdominal aorta... no penetrating ulcer... Inferior mesenteric artery.... high-grade stenosis.... CT scan... 2006 Hyperdense cyst.... lower pole left kidney Vertigo  S/P Ruptured diverticulum-1998 Incisional hernia EGD-01/17/2007 Cardiac Cath-12/05/2006 gb 2003  Review of Systems       Patient denies fever, chills, headache, sweats, rash, change in vision, change in hearing, chest pain, cough, nausea vomiting, urinary symptoms.  All other systems are reviewed and are negative.  Vital Signs:  Patient profile:   77 year old male Height:      70 inches Weight:      170 pounds BMI:     24.48 Pulse rate:   65 / minute BP sitting:   112 / 60  (left arm) Cuff size:   regular  Vitals Entered By: Hardin Negus, RMA (Nov 28, 2009 1:51 PM)  Physical Exam  General:  The patient is stable in general. Head:  head is atraumatic. Eyes:  no xanthelasma. Neck:  no jugular venous distention. Chest Wall:  no chest wall tenderness. Lungs:  lungs are clear.  Respiratory effort is nonlabored. Heart:  cardiac exam reveals an S1-S2.  No clicks or significant murmurs. Abdomen:  abdomen is soft. Extremities:  no peripheral edema. Psych:  patient is oriented to person time and place.  Affect is normal.   Impression & Recommendations:  Problem # 1:  RENAL ARTERY STENOSIS (ICD-440.1) There is a history  of some renal artery stenosis.  Blood pressure stable.  No further workup is needed.  Problem # 2:  CAROTID ARTERY DISEASE (ICD-433.10) The patient had very minimal carotid disease in the past.  No further workup is needed.  Problem # 3:  CORONARY ARTERY DISEASE (ICD-414.00)  His updated medication list for this problem includes:    Metoprolol Tartrate 50 Mg Tabs (Metoprolol tartrate)  .Marland Kitchen... Take 1 tablet by mouth two times a day    Nitroglycerin 0.4 Mg Subl (Nitroglycerin) .Marland Kitchen... As needed    Aspirin 81 Mg Tbec (Aspirin) .Marland Kitchen... Take one tablet by mouth daily Coronary disease is stable.  He is having no symptoms.  EKG is done today and reviewed by me.  There is no significant abnormality.  No further workup.  Problem # 4:  HYPERTENSION (ICD-401.9)  His updated medication list for this problem includes:    Metoprolol Tartrate 50 Mg Tabs (Metoprolol tartrate) .Marland Kitchen... Take 1 tablet by mouth two times a day    Benicar 5 Mg Tabs (Olmesartan medoxomil) .Marland Kitchen... 2 tabs daily    Aspirin 81 Mg Tbec (Aspirin) .Marland Kitchen... Take one tablet by mouth daily Blood pressure is under excellent control.  No change in therapy.  Other Orders: EKG w/ Interpretation (93000)  Appended Document: f2y/jss Follow-up in one year. continue current medications as prescribed.

## 2010-08-27 NOTE — Progress Notes (Signed)
Summary: Pt?  Phone Note Call from Patient Call back at [facility] Phone [phone]   Caller: Patient Summary of Call: pt called stating that allergy sys are bad and pt is requesting to increase Veramyst to two times a day instead of once daily OK to  increase? Initial call taken by: Margaret Pyle, CMA,  Nov 25, 2009 1:17 PM  Follow-up for Phone Call        package insert says maximum of 2 sprays each side per day.  i am happy to request appt with specialist if you wish. Follow-up by: Minus Breeding MD,  Nov 25, 2009 1:34 PM  Additional Follow-up for Phone Call Additional follow up Details #1::        pt informed via VM, told to call back with any questions or concerns Additional Follow-up by: Margaret Pyle, CMA,  Nov 25, 2009 1:43 PM

## 2010-08-27 NOTE — Miscellaneous (Signed)
  Clinical Lists Changes  Problems: Added new problem of VERTIGO (ICD-780.4) Added new problem of * ATHEROMA ABDOMINAL AORTA Added new problem of * INFERIOR MESENTERIC ARTERY STENOSIS Added new problem of CAROTID ARTERY DISEASE (ICD-433.10) Added new problem of RENAL ARTERY STENOSIS (ICD-440.1) Observations: Added new observation of PAST MED HX: CAD   PCI in the past  /  last catheter in May, 2008 medical therapy HYPERTENSION  HYPERLIPIDEMIA  CELLULITIS, LEG, LEFT  UNSPECIFIED DISORDER OF LIVER  PULMONARY NODULE, RIGHT UPPER LOBE ...2008  /  unchanged... CT scan.... July, 2009 and ABDOMINAL PAIN, LEFT LOWER QUADRANT  ADENOCARCINOMA, PROSTATE ---  XRT '08 ESOPHAGEAL STRICTURE  SPINAL STENOSIS  HYPERGLYCEMIA HYPERTHYROIDISM   long-term PTU... Dr.Ellison GERD  COLONIC POLYPS, HX OF  renal artery stenosis.... mild.. Ticlid rash.... tolerates Plavix Hip discomfort..... not vascular Ruptured gallbladder.... cholecystectomy Cough.... ACE inhibitor.... he tolerates ARB Carotid artery disease.... Doppler.... 2006... minimal Atheroma.Marland Kitchen abdominal aorta... no penetrating ulcer... Inferior mesenteric artery.... high-grade stenosis.... CT scan... 2006 Hyperdense cyst.... lower pole left kidney Vertigo  S/P Ruptured diverticulum-1998 Incisional hernia EGD-01/17/2007 Cardiac Cath-12/05/2006 gb 2003 (11/27/2009 11:39) Added new observation of PRIMARY MD: ellison (11/27/2009 11:39)       Past History:  Past Medical History: CAD   PCI in the past  /  last catheter in May, 2008 medical therapy HYPERTENSION  HYPERLIPIDEMIA  CELLULITIS, LEG, LEFT  UNSPECIFIED DISORDER OF LIVER  PULMONARY NODULE, RIGHT UPPER LOBE ...2008  /  unchanged... CT scan.... July, 2009 and ABDOMINAL PAIN, LEFT LOWER QUADRANT  ADENOCARCINOMA, PROSTATE ---  XRT '08 ESOPHAGEAL STRICTURE  SPINAL STENOSIS  HYPERGLYCEMIA HYPERTHYROIDISM   long-term PTU... Dr.Ellison GERD  COLONIC POLYPS, HX OF  renal artery  stenosis.... mild.. Ticlid rash.... tolerates Plavix Hip discomfort..... not vascular Ruptured gallbladder.... cholecystectomy Cough.... ACE inhibitor.... he tolerates ARB Carotid artery disease.... Doppler.... 2006... minimal Atheroma.Marland Kitchen abdominal aorta... no penetrating ulcer... Inferior mesenteric artery.... high-grade stenosis.... CT scan... 2006 Hyperdense cyst.... lower pole left kidney Vertigo  S/P Ruptured diverticulum-1998 Incisional hernia EGD-01/17/2007 Cardiac Cath-12/05/2006 gb 2003

## 2010-08-27 NOTE — Progress Notes (Signed)
Summary: REFILL  Phone Note Call from Patient Call back at Home Phone [phone]   Caller: Patient Summary of Call: REQUEST REFILL ON COLESTIPOL HCL, [facility] [address] Initial call taken by: Migdalia Dk,  April 16, 2010 10:54 AM  Follow-up for Phone Call        Pt advised via VM that his request cannot be filled at this time, 13 months since last OV and MD needs to eval pt at least once yearly to continue filling medicaitons. Advised pt to call back and schedule appt. Follow-up by: Margaret Pyle, CMA,  April 16, 2010 11:04 AM

## 2010-08-27 NOTE — Assessment & Plan Note (Signed)
Summary: FLU SHOT/NWS   Nurse Visit   Allergies: 1)  Drs Choice Diabetic Bandages (Wound Dressings) 2)  Lisinopril (Lisinopril) 3)  Ticlid  Orders Added: 1)  Flu Vaccine 61yrs + MEDICARE PATIENTS [Q2039] 2)  Administration Flu vaccine - MCR [G0008] Flu Vaccine Consent Questions     Do you have a history of severe allergic reactions to this vaccine? no    Any prior history of allergic reactions to egg and/or gelatin? no    Do you have a sensitivity to the preservative Thimersol? no    Do you have a past history of Guillan-Barre Syndrome? no    Do you currently have an acute febrile illness? no    Have you ever had a severe reaction to latex? no    Vaccine information given and explained to patient? yes    Are you currently pregnant? no    Lot Number:[id number]   Exp Date:01/23/2011   Site Given  Left Deltoid [email or web address]

## 2010-08-27 NOTE — Consult Note (Signed)
Summary: [facility]  [facility]   Imported By: Lester [place] 08/01/2009 07:52:34  _____________________________________________________________________  External Attachment:    Type:   Image     Comment:   External Document

## 2010-08-27 NOTE — Letter (Signed)
Summary: [facility]  [facility]   Imported By: Sherian Rein 05/15/2010 11:04:55  _____________________________________________________________________  External Attachment:    Type:   Image     Comment:   External Document

## 2010-08-27 NOTE — Consult Note (Signed)
Summary: [facility]  [facility]   Imported By: Lester [place] 09/04/2009 08:09:49  _____________________________________________________________________  External Attachment:    Type:   Image     Comment:   External Document

## 2010-08-27 NOTE — Letter (Signed)
[facility]  [facility]   Imported By: Lennie Odor 07/24/2010 12:10:38  _____________________________________________________________________  External Attachment:    Type:   Image     Comment:   External Document

## 2010-09-02 ENCOUNTER — Other Ambulatory Visit: Payer: Self-pay | Admitting: Dermatology

## 2010-09-22 ENCOUNTER — Telehealth: Payer: Self-pay | Admitting: Endocrinology

## 2010-10-01 NOTE — Progress Notes (Signed)
Summary: Rx req  Phone Note Call from Patient Call back at Home Phone [phone]   Caller: Patient Summary of Call: Pt called stating he and spouse are going on a 2 week cruise and are requesting some prescriptions to take along incase of emergency. Pt is requesting Rx for Zpak (URI), Cipro (traveler's diarrhea) and Celebrex, please advise Initial call taken by: Margaret Pyle, CMA,  September 22, 2010 11:28 AM  Follow-up for Phone Call        sent Follow-up by: Minus Breeding MD,  September 22, 2010 12:30 PM  Additional Follow-up for Phone Call Additional follow up Details #1::        pt advised via VM Additional Follow-up by: Margaret Pyle, CMA,  September 22, 2010 1:50 PM    New/Updated Medications: CELEBREX 100 MG CAPS (CELECOXIB) 1 tab once daily as needed for pain CIPROFLOXACIN HCL 500 MG TABS (CIPROFLOXACIN HCL) 1 tab two times a day Prescriptions: CIPROFLOXACIN HCL 500 MG TABS (CIPROFLOXACIN HCL) 1 tab two times a day  #14 x 0   Entered and Authorized by:   Minus Breeding MD   Signed by:   Minus Breeding MD on 09/22/2010   Method used:   [facility]ron[facility] to        [facility]       [address]       [place], [place]  [address]       Ph: [phone] or [phone]       Fax: [phone]   RxID:   [id number] CELEBREX 100 MG CAPS (CELECOXIB) 1 tab once daily as needed for pain  #14 x 0   Entered and Authorized by:   Minus Breeding MD   Signed by:   Minus Breeding MD on 09/22/2010   Method used:   Electronically to        [facility]       [address]       [place], [place]  [address]       Ph: [phone] or [phone]       Fax: [phone]   RxID:   [id number] AZITHROMYCIN 500 MG TABS (AZITHROMYCIN) 1 tab once daily  #6 x 0   Entered and Authorized by:   Minus Breeding MD   Signed by:   Minus Breeding MD on 09/22/2010   Method used:   Electronically to        [facility]       [address]       [place], [place]  [address]       Ph: [phone] or [phone]       Fax: [phone]   RxID:   [id number]

## 2010-10-19 ENCOUNTER — Encounter: Payer: Medicare Other | Admitting: Oncology

## 2010-10-19 DIAGNOSIS — C61 Malignant neoplasm of prostate: Secondary | ICD-10-CM

## 2010-10-19 LAB — CBC WITH DIFFERENTIAL/PLATELET
Basophils Absolute: 0 10*3/uL (ref 0.0–0.1)
EOS%: 3.6 % (ref 0.0–7.0)
Eosinophils Absolute: 0.2 10*3/uL (ref 0.0–0.5)
HCT: 37.3 % — ABNORMAL LOW (ref 38.4–49.9)
HGB: 13.2 g/dL (ref 13.0–17.1)
LYMPH%: 24 % (ref 14.0–49.0)
MCH: 33.8 pg — ABNORMAL HIGH (ref 27.2–33.4)
MCV: 95 fL (ref 79.3–98.0)
MONO%: 6.1 % (ref 0.0–14.0)
NEUT#: 3.8 10*3/uL (ref 1.5–6.5)
NEUT%: 65.9 % (ref 39.0–75.0)
Platelets: 214 10*3/uL (ref 140–400)

## 2010-10-19 LAB — COMPREHENSIVE METABOLIC PANEL
AST: 75 U/L — ABNORMAL HIGH (ref 0–37)
Albumin: 4 g/dL (ref 3.5–5.2)
Alkaline Phosphatase: 81 U/L (ref 39–117)
BUN: 22 mg/dL (ref 6–23)
Creatinine, Ser: 1.24 mg/dL (ref 0.40–1.50)
Glucose, Bld: 94 mg/dL (ref 70–99)
Potassium: 4.2 mEq/L (ref 3.5–5.3)

## 2010-10-19 LAB — TESTOSTERONE: Testosterone: 20.85 ng/dL — ABNORMAL LOW (ref 250–890)

## 2010-10-26 ENCOUNTER — Encounter (HOSPITAL_BASED_OUTPATIENT_CLINIC_OR_DEPARTMENT_OTHER): Payer: Medicare Other | Admitting: Oncology

## 2010-10-26 DIAGNOSIS — C61 Malignant neoplasm of prostate: Secondary | ICD-10-CM

## 2010-11-25 ENCOUNTER — Encounter: Payer: Self-pay | Admitting: Cardiology

## 2010-11-30 ENCOUNTER — Encounter: Payer: Medicare Other | Admitting: Oncology

## 2010-11-30 ENCOUNTER — Other Ambulatory Visit: Payer: Self-pay | Admitting: Oncology

## 2010-11-30 LAB — PSA: PSA: 0.02 ng/mL (ref <=4.00)

## 2010-11-30 LAB — COMPREHENSIVE METABOLIC PANEL
ALT: 53 U/L (ref 0–53)
CO2: 26 mEq/L (ref 19–32)
Calcium: 10.3 mg/dL (ref 8.4–10.5)
Chloride: 103 mEq/L (ref 96–112)
Creatinine, Ser: 1.07 mg/dL (ref 0.40–1.50)
Glucose, Bld: 101 mg/dL — ABNORMAL HIGH (ref 70–99)
Total Bilirubin: 0.8 mg/dL (ref 0.3–1.2)
Total Protein: 6.9 g/dL (ref 6.0–8.3)

## 2010-11-30 LAB — CBC WITH DIFFERENTIAL/PLATELET
BASO%: 0.3 % (ref 0.0–2.0)
EOS%: 4.1 % (ref 0.0–7.0)
LYMPH%: 23.1 % (ref 14.0–49.0)
MCH: 33.9 pg — ABNORMAL HIGH (ref 27.2–33.4)
MCHC: 35.7 g/dL (ref 32.0–36.0)
MONO#: 0.4 10*3/uL (ref 0.1–0.9)
RBC: 3.75 10*6/uL — ABNORMAL LOW (ref 4.20–5.82)
WBC: 5.9 10*3/uL (ref 4.0–10.3)
lymph#: 1.4 10*3/uL (ref 0.9–3.3)

## 2010-11-30 LAB — TESTOSTERONE: Testosterone: 11.71 ng/dL — ABNORMAL LOW (ref 250–890)

## 2010-12-01 ENCOUNTER — Encounter: Payer: Self-pay | Admitting: Cardiology

## 2010-12-01 DIAGNOSIS — I2510 Atherosclerotic heart disease of native coronary artery without angina pectoris: Secondary | ICD-10-CM | POA: Insufficient documentation

## 2010-12-01 DIAGNOSIS — N281 Cyst of kidney, acquired: Secondary | ICD-10-CM | POA: Insufficient documentation

## 2010-12-01 DIAGNOSIS — R21 Rash and other nonspecific skin eruption: Secondary | ICD-10-CM | POA: Insufficient documentation

## 2010-12-01 DIAGNOSIS — I701 Atherosclerosis of renal artery: Secondary | ICD-10-CM | POA: Insufficient documentation

## 2010-12-01 DIAGNOSIS — R911 Solitary pulmonary nodule: Secondary | ICD-10-CM | POA: Insufficient documentation

## 2010-12-01 DIAGNOSIS — S35239A Unspecified injury of inferior mesenteric artery, initial encounter: Secondary | ICD-10-CM | POA: Insufficient documentation

## 2010-12-01 DIAGNOSIS — R05 Cough: Secondary | ICD-10-CM | POA: Insufficient documentation

## 2010-12-02 ENCOUNTER — Encounter: Payer: Self-pay | Admitting: Cardiology

## 2010-12-02 ENCOUNTER — Ambulatory Visit (INDEPENDENT_AMBULATORY_CARE_PROVIDER_SITE_OTHER): Payer: Medicare Other | Admitting: Cardiology

## 2010-12-02 VITALS — BP 122/70 | Ht 70.0 in | Wt 167.0 lb

## 2010-12-02 DIAGNOSIS — I779 Disorder of arteries and arterioles, unspecified: Secondary | ICD-10-CM

## 2010-12-02 DIAGNOSIS — R42 Dizziness and giddiness: Secondary | ICD-10-CM

## 2010-12-02 DIAGNOSIS — I2510 Atherosclerotic heart disease of native coronary artery without angina pectoris: Secondary | ICD-10-CM

## 2010-12-02 MED ORDER — NITROGLYCERIN 0.4 MG SL SUBL: 0.4000 mg | SUBLINGUAL_TABLET | SUBLINGUAL | Status: DC | PRN

## 2010-12-02 NOTE — Patient Instructions (Signed)
Your physician has requested that you have a carotid duplex. This test is an ultrasound of the carotid arteries in your neck. It looks at blood flow through these arteries that supply the brain with blood. Allow one hour for this exam. There are no restrictions or special instructions.  Your physician wants you to follow-up in: 1 year.  You will receive a reminder letter in the mail two months in advance. If you don't receive a letter, please call our office to schedule the follow-up appointment.  

## 2010-12-02 NOTE — Assessment & Plan Note (Signed)
There is a soft carotid bruit.  Doppler done in 2006 revealed only minimal disease.  There has not been a followup Doppler since then.  His will be scheduled.

## 2010-12-02 NOTE — Assessment & Plan Note (Signed)
Coronary disease is stable. No further workup needed. 

## 2010-12-02 NOTE — Progress Notes (Signed)
HPI The patient is seen today for followup coronary artery disease.  Fortunately he is doing very well.  I saw him last May, 2011. His last coronary study was a catheterization in May, 2008. Medical therapy was recommended at that time.  He is not having any chest pain or shortness of breath.  He does about full activities. Allergies  Allergen Reactions  . Lisinopril     REACTION: COUGH  . Ticlopidine Hcl     REACTION: rash    Current Outpatient Prescriptions  Medication Sig Dispense Refill  . aspirin 81 MG tablet Take 81 mg by mouth daily.        . Calcium Carbonate-Vitamin D (TH CALCIUM CARBONATE-VITAMIN D) 600-400 MG-UNIT per tablet Take 1 tablet by mouth 2 (two) times daily.        . celecoxib (CELEBREX) 100 MG capsule Take 100 mg by mouth daily as needed.        . colestipol (COLESTID) 1 G tablet Take 1 g by mouth 5 (five) times daily.        . Fluticasone-Salmeterol (ADVAIR DISKUS) 100-50 MCG/DOSE AEPB Inhale 1 puff into the lungs every 12 (twelve) hours. As needed       . meclizine (ANTIVERT) 25 MG tablet Take 25 mg by mouth every 8 (eight) hours as needed.        . methimazole (TAPAZOLE) 5 MG tablet Take 5 mg by mouth every Monday, Wednesday, and Friday.        . metoprolol (LOPRESSOR) 50 MG tablet Take 50 mg by mouth 2 (two) times daily.        . nitroGLYCERIN (NITROSTAT) 0.4 MG SL tablet Place 0.4 mg under the tongue every 5 (five) minutes as needed.        Marland Kitchen olmesartan (BENICAR) 5 MG tablet Take 5 mg by mouth daily.        . pantoprazole (PROTONIX) 40 MG tablet Take 40 mg by mouth daily.        . promethazine-codeine (PHENERGAN WITH CODEINE) 6.25-10 MG/5ML syrup Take 5 mLs by mouth every 4 (four) hours as needed.        Marland Kitchen DISCONTD: azithromycin (ZITHROMAX) 500 MG tablet Take 500 mg by mouth daily.        Marland Kitchen DISCONTD: ciprofloxacin (CIPRO) 500 MG tablet Take 500 mg by mouth 2 (two) times daily.        Marland Kitchen DISCONTD: Fluticasone-Salmeterol (ADVAIR DISKUS) 100-50 MCG/DOSE AEPB Inhale 1  puff into the lungs 2 (two) times daily.        Marland Kitchen DISCONTD: olmesartan (BENICAR) 5 MG tablet Take 5 mg by mouth 2 (two) times daily.          History   Social History  . Marital Status: Married    Spouse Name: N/A    Number of Children: 4  . Years of Education: N/A   Occupational History  . 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 Smoking status: Not on file  . Smokeless tobacco: Not on file  . Alcohol Use: No  . Drug Use: No  . Sexually Active: Not on file   Other Topics Concern  . Not on file   Social History Narrative  . No narrative on file    Family History  Problem Relation Age of Onset  . Breast cancer Sister     2 sisters  . Pemphigus vulgaris Mother     died at 85    Past Medical History  Diagnosis Date  .  CAD (coronary artery disease)     PCI in the past / last catheterization May, 2008, medical therapy  . Hypertension   . Hyperlipemia   . Unspecified disorder of liver   . Cellulitis, leg     left  . Pulmonary nodule, right     2008 / unchanged CT scan, July, 2009  . Abdominal pain, left lower quadrant   . Adenocarcinoma of prostate   . Esophageal stricture   . Spinal stenosis   . Hyperglycemia   . Hyperthyroidism   . GERD (gastroesophageal reflux disease)   . Hx of colonic polyps   . Renal artery stenosis     Mild  . Atheroma     no penetrating ulcer, Abdominal aorta  . Inferior mesenteric artery injury     High-grade stenosis, CT scan, 2006  . Vertigo   . Diverticulum   . Incisional hernia   . Rash     Ticlid  . Cough     ACE Inhibitor, tolerates ARB  . Carotid artery disease     Doppler, 2006, minimal disease  . Renal cyst     Hyperdense cyst lower pole left kidney    Past Surgical History  Procedure Date  . Esophagogastroduodenoscopy   . Cardiac catheterization   . Incisional hernia repair     ROS  Patient denies fever, chills, headache, sweats, rash, change, change in hearing, chest pain, cough, nausea  vomiting, urinary symptoms.  All other systems are reviewed and are negative.  PHYSICAL EXAM Patient is oriented to person time and place.  Affect is normal.  He is here with his wife today.  Head is atraumatic.  There is no xanthelasma.  There is no jugular venous distention.  There is question of a soft right carotid bruit.  Lungs are clear.  Respiratory effort is nonlabored.  Cardiac exam reveals S1 and S2.  There is a soft systolic murmur.  The abdomen is soft.  There is no peripheral edema.  There no skin rashes.  There are no musculoskeletal deformities. Filed Vitals:   12/02/10 1152  BP: 122/70  Height: 5\' 10"  (1.778 m)  Weight: 167 lb (75.751 kg)    EKG EKG is done today and reviewed by me.  There is mild sinus bradycardia with mild first degree AV block.  There is no significant change.  ASSESSMENT & PLAN

## 2010-12-02 NOTE — Assessment & Plan Note (Signed)
He continues to have mild intermittent vertigo.  This is very infrequent.  No further workup needed.

## 2010-12-08 NOTE — H&P (Signed)
NAME:  Kurt Elliott, Kurt Elliott NO.:  [id number]   MEDICAL RECORD NO.:  [id number]          PATIENT TYPE:  OIB   LOCATION:  2855                         FACILITY:  [facility]   PHYSICIAN:  Luis Abed, MD, FACCDATE OF BIRTH:  1921/04/04   DATE OF ADMISSION:  12/05/2006  DATE OF DISCHARGE:                              HISTORY & PHYSICAL   CHIEF COMPLAINT:  Chest pain.   HISTORY OF PRESENT ILLNESS:  The patient is an 77 year old male with a  history of coronary artery disease.  He gets chest pain almost daily.  He states it occurs at various times and with various activities.  Sometimes it wakes him, sometimes he gets it after meals and sometimes  he gets it with exertion but not consistently.  He has tried Tums in the  past but did not feel this was helpful.  He is supposed to be taking  Prevacid daily but states that he frequently forgets to take it but when  he does take it he feels like this may decrease his symptoms somewhat,  but does not relieve them.   The patient had onset of 10/10 substernal chest pain described as a  pressure brick on his chest.  It started at approximately 7:30 p.m. on  Dec 04, 2006.  It started after dinner.  He did not have nitroglycerin  with him and went home and got it.  He took a sublingual nitroglycerin  at about 8:00 p.m. and the pain decreased to 2/10.  A second  nitroglycerin tablet relieved his pain completely by about 8:30.  He  denied associated shortness of breath, nausea, vomiting or diaphoresis.  He states this is similar to previous episodes of chest pain.  He is not  sure if this is like the pain he had before any of his stents.  He is  currently symptom free.   PAST MEDICAL HISTORY:  1. Status post cardiac catheterization in September 2004 with a      calcified LAD, 20% in-stent restenosis and 30% mid lesion, second      diagonal 50% stenosis, OM1 40% stenosis, circumflex without      significant disease, RCA less than 20%  proximal in-stent restenosis      with a 40% lesion in the mid vessel, posterolateral with less than      20% in-stent restenosis, EF 70% and left renal artery 40% stenosed.  2. Hyperlipidemia.  3. Hypertension.  4. Family history of coronary artery disease.  5. Graves disease/hyperthyroidism.  6. High-grade left renal artery stenosis and high-grade inferior      mesenteric stenosis noted on CT angiogram in 2006.  7. History of aortic atheroma.  8. History of carotid bruits without significant stenosis.  9. History of elevated PSA treated successfully with hormone therapy.  10.History of diverticulosis.  11.Status post PTCA and stent to the LAD and RCA in 1997.  12.Status post PTCA and stent to the posterolateral vessel in 2000.  13.History of spinal stenosis.   PAST SURGICAL HISTORY:  Status post multiple cardiac catheterizations,  as well as open cholecystectomy in 1998, stage  II sigmoid colon  resection for ruptured diverticula and ventral hernia repair.   ALLERGIES:  He is intolerant to ACE with a cough, he gets a rash and  TICLID.  He is also allergic to TAPE.   MEDICATIONS:  1. Niaspan 500 mg 3 tablets daily.  2. Aspirin 81 mg daily.  3. Metoprolol 50 mg b.i.d.  4. Benicar 20 mg a day.  5. Simvastatin 5 mg a day.  6. PTU 50 mg Monday, Wednesday and Friday.  7. Prevacid 30 mg daily.  8. Multivitamin daily.   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  He  exercises by swimming regularly and does not abuse alcohol, tobacco or  drugs.   FAMILY HISTORY:  His mother died at age 2 of pemphigus.  His father  died at age 52 of a cerebral hemorrhage, but he has coronary artery  disease in his brothers.   REVIEW OF SYSTEMS:  He has acute on chronic dyspnea on exertion but  denies edema or palpitations or presyncope.  He has noticed weakness and  fatigue, which is possibly secondary to the  hormone injections.  He has  chronic arthralgias and hip pain.  He has reflux symptoms occasionally  and about a week ago he had problems with abdominal cramping which has  since resolved.  He denies hematemesis, hemoptysis or melena.  Review of  systems is otherwise negative.   PHYSICAL EXAM:  VITAL SIGNS: Temperature is 97.0, blood pressure 156/73,  pulse 75, his respiratory rate 18, O2 saturation 99% on room air.  GENERAL:  He is a well-developed, well-nourished white male in no acute  distress.  HEENT:  Normal.  NECK:  Supple and without lymphadenopathy, thyromegaly or JVD.  There is  a soft bruit which is possibly radiation of a murmur.  CV:  His heart is regular rate and rhythm with an S1 and S2 and a soft  murmur is noted.  His distal pulses are slightly decreased DP and PT but  intact and capillary refill is within normal limits.  Radial pulses are  2+.  No femoral bruits are appreciated.  LUNGS:  Essentially clear to auscultation bilaterally.  SKIN:  No rashes or lesions are noted.  ABDOMEN:  Soft and with active bowel sounds.  He has some diffuse  tenderness and all surgical scars are well-healed.  EXTREMITIES:  There  is no cyanosis, clubbing or edema noted.  MUSCULOSKELETAL:  There is no joint deformity or effusion and no spine  or CVA tenderness.  NEURO:  He is alert and oriented.  Cranial nerves II-XII grossly intact.   Chest x-ray, EKG and labs are all pending.   IMPRESSION:  Chest pain:  He had a significant episode of chest pain  last night.  He is here today for cardiac catheterization to further  define his anatomy.  Hopefully his renal arteries can also be assessed  today.  His fatigue and shortness of breath may be secondary to the  hormone therapy which has been successful in reducing his PSA  from greater than 20 to 0.2.  He has no signs of heart failure at this  time and is at low risk for pulmonary embolism because of his activity level.  He will be  continued on his home medication with further  evaluation and treatment depending on the results of the cardiac  catheterization.      Bjorn Loser  Barrett, PA-C      Luis Abed, MD, [facility]  Electronically Signed    RB/MEDQ  D:  12/05/2006  T:  12/05/2006  Job:  [id number]   cc:   Luis Abed, MD, [facility]  [name], MD  Dr. [name]

## 2010-12-08 NOTE — Cardiovascular Report (Signed)
NAMEHERMES, WAFER NO.:  [id number]   MEDICAL RECORD NO.:  [id number]          PATIENT TYPE:  INP   LOCATION:  2041                         FACILITY:  [facility]   PHYSICIAN:  Everardo Beals. Juanda Chance, MD, FACCDATE OF BIRTH:  [birth year]   DATE OF PROCEDURE:  12/05/2006  DATE OF DISCHARGE:                            CARDIAC CATHETERIZATION   CLINICAL HISTORY:  Mr. Ragone is 77 years old and has known coronary  heart disease.  He has had previous stents placed in the LAD and  proximal right coronary artery.  He had been having symptoms of chest  pain over the past 2 weeks and it has not been clear whether his  symptoms are GI or cardiac.  Last night, he had an episode of more  prolonged pain and for which took nitroglycerin and got relief.  He  called Dr. Myrtis Ser today, who arranged for him to come in today for a  catheterization.  His initial troponin was positive at 0.12.   His ECG showed no acute changes.   PROCEDURE:  The procedure was performed via the right femoral artery  using arterial sheath and 6-French preformed coronary catheters.  A  front wall arterial puncture was performed and Omnipaque contrast was  used.  A distal aortogram was performed to evaluate previous renal  artery stenosis.  The right femoral was closed with an Angio-Seal at the  end of the procedure.  The patient tolerated the procedure well and left  the laboratory in satisfactory condition.   RESULTS:  Left main coronary artery:  The left main coronary artery was  free of major obstruction, although there is a small plaque in the  distal portion.   Left anterior descending artery:  The left anterior descending artery  gave rise to 2 diagonal branch and 2 septal perforators.  There was 30%  ostial narrowing.  There was less than 10% narrowing at the stent site  in the proximal LAD.  There was segmental 40% narrowing in the  midvessel.   Circumflex artery:  The circumflex artery gave rise to a  small ramus  branch, a large marginal branch and a small AV branch.  There is 40%  narrowing in the proximal portion of the marginal branch.   Right coronary artery:  The right coronary artery was a moderately large  vessel that gave rise to a conus branch, a right ventricular branch, a  posterior descending branch and 2 posterolateral branches.  There was  less than 10% stenosis at stent site in the proximal right coronary  artery.  There was 50% narrowing in the mid right coronary artery and  irregularities distally.   LEFT VENTRICULOGRAM:  The left ventriculogram performed in the RAO  projection showed vigorous wall motion with no areas of hypokinesis.  The estimated ejection fraction was 65%.   LEFT VENTRICULOGRAM:  The left ventriculogram performed in the LAO  projection showed good wall motion with no areas of hypokinesis.   DISTAL AORTOGRAM:  A distal aortogram was performed which showed 40%  narrowing in the left renal artery.  There were irregularities in the  aorta, but no major aortoiliac obstruction.   HEMODYNAMIC DATA:  The left ventricle pressure was 138/8 and aortic  pressure was 138/76 with a mean of 109.   CONCLUSION:  Nonobstructive coronary artery disease, status post prior  percutaneous coronary interventions as described above with less than  10% narrowing at the stent site in the proximal left anterior  descending, 40% narrowing in the mid left anterior descending, 40%  narrowing in the marginal branch of the circumflex artery, less than 10%  narrowing within the stent in the proximal right coronary artery, 50%  narrowing in the mid right coronary and normal left ventricular  function.   RECOMMENDATIONS:  The patient has nonobstructive disease and there is no  clear culprit to explain his symptoms and abnormal troponin.  The  etiology of his symptoms and abnormal troponin is not clear.  On review  of these findings, it seems probable that is not cardiac in  etiology.  We will plan to get serial troponins to see if this might be a false-  positive.  We will also get a D-dimer to evaluate for the small  possibility of pulmonary embolism.  He does have a history of prostate  cancer.  We will decide after we have results of the test if further  evaluation, specifically a CT angiogram to rule out pulmonary embolism,  is indicated.      Bruce Elvera Lennox Juanda Chance, MD, [facility]  Electronically Signed     BRB/MEDQ  D:  12/05/2006  T:  12/06/2006  Job:  [id number]   cc:   Luis Abed, MD, [facility]  [name]. [name], MD,FACG  [facility]

## 2010-12-08 NOTE — Assessment & Plan Note (Signed)
[facility]                            CARDIOLOGY OFFICE NOTE   [name], [name]                     MRN:          [id number]  DATE:12/15/2006                            DOB:          18-Aug-1920    [name] is seen today for cardiology followup. I had seen him last  in the office in October of 2007 and then received a phone call from his  son-in-law that he had had chest discomfort after eating. We were quite  concerned about this episode, and there appeared to be a response to  nitroglycerin. We arranged for cardiac catheterization the following  day. The patient actually had a troponin that was elevated. The first  was 0.12, and the second was 0.19. CPKs were normal. Cardiac  catheterization showed that there were several 40 to 50% LAD lesions.  There were also lesions in the circumflex and right. However, there were  no highly obstructive lesions. Wall motion was good, and ejection  fraction was 65%. Images were also obtained of the renal arteries, and  he has left renal artery stenosis of 40%. This was done because there  was a prior history of some renal artery stenosis. It was felt that his  pain was not cardiac. Of course, we cannot completely explain the  troponin elevation. This will be kept in mind over time. The patient  does have history of GI symptoms. It is possible that some of his  symptoms with the response to nitroglycerin could be GI in origin. I  have not left him on any nitrates or Norvasc at this time. He will use  p.r.n. nitroglycerin. Consideration could be given to using either  nitrates or Norvasc or Cardizem if patient were to have recurrent  symptoms.   Also while in the hospital, decision was made to proceed with a CT scan  of the chest. This study showed that there were no pulmonary emboli.  There was a small 4-mm right upper lobe nodule. This was reviewed both  by radiology and by Dr. Eden Emms. It was felt that  it could possibly  represent a small scar. A very small neoplasm could not be absolutely  ruled out. Followup CT scan in 6 months is recommended. I have discussed  this finding with the patient and his wife today. After he was  discharged from the hospital, he went to [facility] the following day  and received his next set of injections for his prostate from Dr.  Colon Branch. After that, he traveled to one of the many graduations of his  grandchildren. He was tired but functioned adequately, and he is now  here. He is feeling well, but he is fatigued. He does not have recurring  chest pain.   PAST MEDICAL HISTORY:  ALLERGIES:  ACE INHIBITOR CAUSES A COUGH. HE HAS  HAD A RASH FROM TICLID, AND HE HAS A TAPE ALLERGY.   MEDICATIONS:  1. Multivitamin.  2. Metoprolol 50 b.i.d.  3. Zocor 5.  4. Benicar 20.  5. PTU 50 on Monday, Wednesday, Friday.  6. Aspirin 81.  7. Niaspan 1500  mg.   OTHER MEDICAL PROBLEMS:  See the list below.   REVIEW OF SYSTEMS:  Other than some fatigue, he is feeling well. His  review of systems otherwise is negative.   PHYSICAL EXAMINATION:  Weight is 169 pounds. Blood pressure is 118/76  with a pulse of 68.  The patient is oriented to person, time and place. Affect is normal. He  is here with his wife today.  HEENT:  Reveals no xanthelasma. He has normal extraocular motion.  Conjunctivae are normal. There are no carotid bruits. There is no  jugular venous distention.  LUNGS:  Are clear. Respiratory effort is not labored.  CARDIAC EXAM:  Reveals a S1 with a S2. There are no clicks or  significant murmurs.  His abdomen is soft. He has normal bowel sounds.  He has no significant peripheral edema.   EKG shows no changes, and he is in sinus rhythm.   PROBLEMS:  1. History of coronary disease with interventions in the past. He      underwent followup catheterization in 2004 and Cardiolite in 2005.      See the history of present illness for the catheterization  data      concerning his very recent catheterization in May of 2008. There      has been no major change, and he has no major obstructive lesions      at this time, and he will be treated medically. I cannot completely      explain the slight troponin elevation recently, and this will be      kept in mind. We may want to consider a baseline troponin at some      point over time.  2. History of tachycardia related to hyperthyroidism. This is treated      with PTU and followed carefully by Dr. Everardo All.  3. Hyperlipidemia. Treated by Dr. Everardo All.  4. Mild renal artery stenosis. See the data above showing that his      renal arteries show a 40% lesion at this time.  5. History of a rash from Ticlid in 1997, but he tolerates Plavix.  6. Some hip discomfort thought not to be vascular.  7. History of gallbladder surgery with ruptured gallbladder in 1998.  8. History of a ruptured diverticulum also in 1998.  9. History of an incisional hernia that was repaired.  10.Hypertension, treated.  11.Hyperthyroidism, treated by Dr. Everardo All.  12.Elevated PSA with the ongoing evaluation and treatment by Dr.      Hoy Morn in [place]. It is of note that the patient also      has twin sons who are urologists in the [place] area.  13.History of a cough from ACE inhibitor. He does tolerate ARBs.  14.Soft parotid bruit that was heard in the past. Dopplers in 2006      showed only minimal disease. I will consider followup over time.  15.Question of an irregular plaque and possibly a penetrating ulcer on      ultrasound in the past. CT revealed some aortic atheroma but no      ulcer.  16.History of high-grade stenosis of the origin of the inferior      mesenteric artery on CT scan 2006.  17.Probable hyperdense cyst arising from the lower pole of the left      kidney by history.  18.*A 4-mm nodule in the right upper lobe of the lung. This was seen     by CT during this recent hospitalization. Hopefully,  it is scar. We      cannot absolutely rule out the possibility of neoplasm. Followup in      6 months is recommended.   I will see the patient back myself in 3 months. I also want him to see  Dr. Everardo All over the next month or two so that Dr. Everardo All can be  completely update about patient's overall care and see how he is doing.     Luis Abed, MD, [facility]  Electronically Signed    JDK/MedQ  DD: 12/15/2006  DT: 12/15/2006  Job #: [id number]   cc:   [name]. [name], MD,FACG  [name], MD  Hoy Morn, M.D.

## 2010-12-08 NOTE — Discharge Summary (Signed)
Kurt Elliott NO.:  [id number]   MEDICAL RECORD NO.:  [id number]          PATIENT TYPE:  INP   LOCATION:  2041                         FACILITY:  [facility]   PHYSICIAN:  Noralyn Pick. Eden Emms, MD, FACCDATE OF BIRTH:  1921-06-09   DATE OF ADMISSION:  12/05/2006  DATE OF DISCHARGE:  12/06/2006                               DISCHARGE SUMMARY   PROCEDURES:  1. Cardiac catheterization.  2. Coronary arteriogram.  3. Left ventriculogram.  4. CT of the chest with contrast.   PRIMARY DIAGNOSIS:  1. Chest pain, troponin I mildly elevated but no critical lesions at      cath.   SECONDARY DIAGNOSIS:  1. Status post PTCA and stent to the LAD and proximal RCA in 1997.  2. Status post PTCA and stent to the PDA in 2000.  3. Hypertension.  4. Hyperlipidemia.  5. Family history of coronary artery disease.  6. History of hyperthyroidism/Graves' disease.  7. History of renal artery stenosis, left renal artery 40% by cath      this admission.  8. 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  9. History of aortic atheroma  10.History of carotid bruits without critical carotid disease.  11.History of prostate cancer with a recent PSA 0.21 on the hormone      therapy.  12.History of diverticulosis.  13.History of spinal stenosis.  14.Allergy or intolerance to ACE inhibitors, Ticlid and tape.  15.Status post open cholecystectomy.  16.Status post stage II resection of the sigmoid colon because of      ruptured diverticula.  17.Status post ventral hernia repair.   TIME AT DISCHARGE:  Forty one minutes.   HOSPITAL COURSE:  [name] is an 77 year old male with a history of  coronary artery disease.  He reports increasing frequency of episodic  chest pain which is not necessarily associated with exertion.  TUMS have  not been really helpful.  The day before admission, he had 10/10 chest  pain after eating at approximately  7:30 p.m.  He did not take  nitroglycerin until approximately 8:00 p.m., but at that time, his pain  went from 10/10 to 2/10.  A second nitroglycerin relieved his chest pain  completely by about an hour after it started.  He came to the hospital  and was admitted for further evaluation.   His troponins ranged between 0.12 and 0.19, but there was no clear  crescendo-decrescendo pattern.  His CK MBs were normal.  A cardiac  catheterization was done to further define his anatomy.  The cardiac  catheterization showed multiple 40-50% lesions in the LAD, circumflex  and RCA.  There was less than 10% in-stent restenosis in the LAD, RCA  and PDA.  His EF was 65% with no wall motion abnormalities.  His left  renal artery was 40% stenosed.  Dr. Juanda Chance evaluated the films and felt  that there was no critical lesion to explain his increased troponin.  A  D-dimer was checked and a CT of the chest was ordered.   On Dec 06, 2006, [name] was evaluated by Dr. Eden Emms and Dr.  Juanda Chance.  He had no critical arrhythmias.  He was slightly anemic with a  post cath hemoglobin of 11.5 and hematocrit of 32.4, WBC 6.5, platelets  188.  Other postprocedure labs were within normal limits with a BUN of  16 and creatinine of 1.15, glucose 125 post cath.  TSH was within normal  limits at 1.165, and a 2D chest x-ray showed prominent heart size but no  acute abnormalities.  His vital signs were stable.  The results of the  CT scan are pending completion, but if there is no significant lesion on  the CT scan, he will be discharged home later today with outpatient  follow-up arranged.  He was taking Prevacid 30 mg as needed and this  will be increased to q. day.   DISCHARGE INSTRUCTIONS:  1. His activity level is to be increased gradually.  He is not to do      any lifting for a week and no driving for 2 days.  2. He is to call our office for any problems with the cath site.  3. He is to follow up with Dr. Myrtis Ser on  May 22 at 9:15 with Dr. Everardo All      and Dr. [name] as needed.   DISCHARGE MEDICATIONS:  1. Niaspan 5 mg 3 tablets daily.  2. Coated aspirin 91 mg daily.  3. Metoprolol 50 mg b.i.d.  4 . Benicar 20 mg a day.  1. Simvastatin 5 mg daily.  2. __________ 50 mg Monday, Wednesday and Friday.  3. Prevacid 30 mg daily.  4. Vitamins as prior to admission.      [name], PA-C      Noralyn Pick. Eden Emms, MD, [facility]  Electronically Signed    RB/MEDQ  D:  12/06/2006  T:  12/06/2006  Job:  [id number]   cc:   [name]. [name], MD,FACG  [name], MD

## 2010-12-08 NOTE — Assessment & Plan Note (Signed)
[facility]                            CARDIOLOGY OFFICE NOTE   NARADA, UZZLE                     MRN:          [id number]  DATE:03/06/2008                            DOB:          [birth year]    Mr. Mckibben is doing very well.  He has some intermittent vertigo.  Also, he recently tripped on a rug at home.  He did injure his right  arm.  Ultimately, this was treated and he has been stable.  This was not  true syncope.  His blood pressure had been on the lower side and Dr.  Everardo All took him off Benicar.  Today, his pressure is back up a little  bit and we will put him on a small dose of the Benicar.  He has not been  having any significant chest pain.   PAST MEDICAL HISTORY:   ALLERGIES:  He had rash from TICLID and he had cough from an ACE  INHIBITOR.   MEDICATIONS:  1. Multivitamin.  2. Metoprolol 50 b.i.d.  3. Zocor 5.  4. PTU 3 days a week.  5. Aspirin 81.  6. Niaspan.  7. Calcium.   OTHER MEDICAL PROBLEMS:  See the complete list in the note of September 01, 2007.   REVIEW OF SYSTEMS:  He is doing well.  His review of systems is  negative.   PHYSICAL EXAMINATION:  VITAL SIGNS:  Blood pressure is 142/68 with a  pulse of 65.  GENERAL:  The patient is oriented to person, time and place.  Affect is  normal.  HEENT:  No xanthelasma.  He has normal extraocular motion.  NECK:  There are no carotid bruits.  There is no jugular venous  distention.  LUNGS:  Clear.  Respiratory effort is not labored.  CARDIAC:  S1 and S2.  There are no clicks or significant murmurs.  ABDOMEN:  Soft.  EXTREMITIES:  He has no peripheral edema.  He is here with his wife  today.   Problems are listed on the note of September 01, 2007.  #1.  Coronary disease.  Most recent cath was in May 2008 and he was  stable.  He is doing very well.  #10.  Hypertension.  His pressure had been on the low-side on 20 of  Benicar.  We will now restart 10 of Benicar.  #17.   A 4-mm nodule in right upper lobe of the lung.  He has had  followup CT scan showing that this is benign.   He is stable.  We will put him back on Benicar 10 mg daily.     Luis Abed, MD, [facility]  Electronically Signed    JDK/MedQ  DD: 03/06/2008  DT: 03/07/2008  Job #: [id number]   cc:   [name], MD

## 2010-12-08 NOTE — Discharge Summary (Signed)
NAMEGERARD, CANTARA NO.:  [id number]   MEDICAL RECORD NO.:  [id number]          PATIENT TYPE:  INP   LOCATION:  2041                         FACILITY:  [facility]   PHYSICIAN:  Noralyn Pick. Eden Emms, MD, FACCDATE OF BIRTH:  26-Dec-1920   DATE OF ADMISSION:  12/05/2006  DATE OF DISCHARGE:  12/06/2006                               DISCHARGE SUMMARY   ADDENDUM:  Kurt Elliott had a CT angiogram of the chest to rule out PE.  The images  were reviewed by Dr. Eden Emms.  There  were no pulmonary emboli seen.  There was a 4 mm right upper lobe nodule with mildly irregular borders  and a small amount of eccentric cavitation.  The radiologist reviewed it  and felt that this could represent a small scar or small neoplasm.  These results were reviewed with Dr. Eden Emms who felt that a follow-up CT  in 6 months would be indicated.  He did not feel that any further  inpatient evaluation was indicated at this time.  Kurt Elliott nurse  was called and informed of these results and he was discharged on  12/06/2006.  All discharge instructions are otherwise unchanged.      [name], PA-C      Noralyn Pick. Eden Emms, MD, [facility]  Electronically Signed    RB/MEDQ  D:  12/06/2006  T:  12/06/2006  Job:  [id number]   cc:   [name], MD  [name]. [name], MD,FACG

## 2010-12-08 NOTE — Assessment & Plan Note (Signed)
[facility]                            CARDIOLOGY OFFICE NOTE   HEYDEN, JABER                     MRN:          [id number]  DATE:09/01/2007                            DOB:          [birth year]    Mr. Sessums is doing well.  He had an episode of vertigo earlier this  week.  This improved with meclizine and he is quite stable.  He has not  had any chest pain.  He has no shortness of breath. He has no syncope or  presyncope.  He does have documented coronary disease.  I had seen him  last in August 2008 and he has been stable since then.   PAST MEDICAL HISTORY:   ALLERGIES:  He has had a cough with an ACE INHIBITOR and he had a rash  from TICLID and he has TAPE allergy.   MEDICATIONS:  1. Multivitamin.  2. Metoprolol 50 b.i.d.  3. Zocor 5.  4. Benicar 20.  5. PTU 50 on Monday, Wednesday, Friday.  6. Aspirin 81.  7. Niaspan 1500 mg per day.   OTHER MEDICAL PROBLEMS:  See the list below.   REVIEW OF SYSTEMS:  As mentioned, he had recent vertigo but otherwise  his review of systems is negative.   PHYSICAL EXAM:  Weight is 171 pounds and stable.  Blood pressure is  125/66.  Pulse is 67.  The patient is oriented to person, time and place.  Affect is normal.  He is here today with his wife as always.  Also his son from [place] (Dr.  Jiles Crocker) is here visiting and came in to say hello also.  HEENT:  Reveals no xanthelasma.  He has normal extraocular motion.  There are no carotid bruits.  There is no jugular venous distention.  LUNGS:  Clear.  RESPIRATORY:  Not labored.  CARDIAC:  Revealed an S1 with an S2. There are no clicks or significant  murmurs.  ABDOMEN:  Soft.  He has no peripheral edema.   EKG shows sinus rhythm.   PROBLEM LIST:  1. History of coronary disease.  Most recent cath was in May 2008.      That was stable.  He is treated medically.  2. History of tachycardia related to his hyperthyroidism and he      remains on  long-term PTU followed by Dr. Rennis Harding.  3. Hyperlipidemia treated.  4. Mild renal artery stenosis.  This is discussed at the beginning of      my note of Dec 15, 2006 and we believe it is in the 40% range.  5. History of a rash from Ticlid in 1997.  He does tolerate Plavix.  6. Some hip discomfort felt not to be vascular.  7. History of gallbladder surgery and ruptured gallbladder in 1998.  8. History of a ruptured diverticulum also in 1998.  9. Incisional hernia that was later repaired.  10.Hypertension treated.  11.Elevated PSA with a diagnosis of prostate cancer that has been      treated in [facility] and through advice from his sons and  ultimately here in [place].  He did have radiation and had some      radiation proctitis but this has resolved.  12.History of cough from ACE inhibitor.  He does tolerate ARBs.  13.History of a carotid bruit heard in the past.  Dopplers in 2006      showed only minimal disease.  In the future, we will consider      repeat.  14.Question of an irregular plaque and possibly a penetrating ulcer on      ultrasound in the abdominal aorta. CT revealed aortic atheroma but      no ulcer and this is followed.  15.History of high-grade stenosis at the origin of the inferior      mesenteric artery by CT in 2006.  16.Probable hyperdense cyst arising from the lower pole of the left      kidney by history.  17.4-mm nodule in the right upper lobe of the lung that was seen      during hospitalization in the spring of 2008.  Hopefully this is a      scar.  Follow up with this is per Dr. Everardo All.   Mr. Stefanski is stable.  No change in his therapy.  I will see him back  in 6 months.     Luis Abed, MD, [facility]  Electronically Signed    JDK/MedQ  DD: 09/01/2007  DT: 09/02/2007  Job #: [id number]   cc:   [name], MD

## 2010-12-08 NOTE — Assessment & Plan Note (Signed)
[facility]                            CARDIOLOGY OFFICE NOTE   Kurt, Elliott                     MRN:          [id number]  DATE:03/14/2007                            DOB:          14-Aug-1920    Kurt Elliott is doing very well.  See my complete note of Dec 15, 2006.  He is following carefully with Dr. Everardo Elliott.  His cardiac status has been  stable.  There is a 4-mm right upper lobe nodule that was noted, and he  needs a 9-month followup that will be due later in the year, and he will  mention this to Dr. Everardo Elliott again when he sees him in followup soon.  His cardiac status is stable.  He fell recently and bruised his chest  and his knee.  He did not have syncope.  He is not having chest pain,  presyncope, syncope, or shortness of breath.  His prostate cancer is  being aggressively managed here in [place] and he will be starting  radiation soon.   PAST MEDICAL HISTORY:   ALLERGIES:  ACE INHIBITOR cough, rash from TICLID, TAPE allergy.   MEDICATIONS:  1. Multivitamin.  2. Metoprolol.  3. Zocor.  4. Benicar.  5. PTU.  6. Aspirin.  7. Niaspan.   OTHER MEDICAL PROBLEMS:  See the extensive list on my note of Dec 15, 2006.   REVIEW OF SYSTEMS:  He actually is feeling well in general.  There are  no complaints.   PHYSICAL EXAMINATION:  Blood pressure is 117/85 with a pulse of 78.  The patient is oriented to person, time, and place and his affect is  normal.  HEENT:  No xanthelasma.  He has normal extraocular motion.  There are no  carotid bruits.  There is no jugular venous distension.  There is no obvious wound to his chest.  CARDIAC EXAM:  Reveals an S1 with an S2.  There is a soft systolic  murmur.  The abdomen is soft.  He has no peripheral edema.   No labs are done today.   Problems are listed #1 through #18 on my note of Dec 15, 2006.  His  cardiac status is stable.  He will follow with Dr. Everardo Elliott.  I will see  him back  in 6 months for cardiology followup.     Kurt Abed, MD, [facility]  Electronically Signed    JDK/MedQ  DD: 03/14/2007  DT: 03/14/2007  Job #: [id number]   cc:   Kurt Signs A. Everardo All, MD

## 2010-12-11 NOTE — Discharge Summary (Signed)
Kurt Elliott, Kurt Elliott NO.:  [id number]   MEDICAL RECORD NO.:  [id number]                   PATIENT TYPE:  INP   LOCATION:  3737                                 FACILITY:  [facility]   PHYSICIAN:  Luis Abed, M.D. [facility]           DATE OF BIRTH:  1920/12/19   DATE OF ADMISSION:  04/11/2002  DATE OF DISCHARGE:  04/12/2002                           DISCHARGE SUMMARY - REFERRING   PROCEDURES:  Coronary angiogram 04/11/2002   REASON FOR ADMISSION:  The patient is a 77 year old male with a history of  coronary artery disease status post pecutaneous coronary intervention  followed by Dr. Lovena Neighbours, who presented to [facility] ER for evaluation of  sharp substernal chest discomfort.  Please refer to dictated admission note  for full details.   LABORATORY DATA:  Cardiac enzymes: CPK-MB normal x 2; troponin I 0.40, 0.32.  Normal metabolic profile.  Normal CBC.  TSH 2.1.   Admission chest x-ray:  No active disease.   HOSPITAL COURSE:  The patient ruled out for MI with negative serial cardiac  enzymes.  No new medication adjustments were made.  The patient's  presentation was worrisome for CAD progression, and recommendation by Dr. Lovena Neighbours was to proceed with relook coronary angiography.  Of note, the troponin  levels were mildly elevated (peak 0.40), but the CPK-MBs were above normal.   Coronary angiogram performed for [name] (see report for full  details) revealed nonobstructive three-vessel coronary artery disease with  patent stents and normal left ventricular function.  Specifically, there was  normal __________ ; 25% ostial LAD with patent proximal stent and 25%  diffuse disease in the mid LAD; 50% ostial diagonal #1 (small); 30% ostial  MOM with diffuse circumflex disease; 25% in-stent proximal, 30% mid RCA;  luminal irregularities of proximal PDA stent site with 40% ostial disease.  Left ventriculogram was normal.   Dr. Antoine Poche recommended  continued medical management.  As noted earlier, no  new medication adjustments made this admission, and recommendation was to  discharge home on previous home medication regimen.   DISCHARGE MEDICATIONS:  1. Metoprolol 50 mg b.i.d.  2. Zocor 5 mg q.d.  3. Benicar 40 mg q.d.  4. Aspirin 325 mg q.d.  5. Protonix 40 mg q.d.  6. PTU 50 mg 3 times weekly.  7. Nitrostat 0.4 mg p.r.n.   DISCHARGE INSTRUCTIONS:  No heavy lifting, strenuous activity, or driving x  2 days; low-fat, low-cholesterol diet; call our office if there is any  swelling/bleeding of the groin.   The patient is scheduled to follow up with Dr. Lovena Neighbours on Thursday,  05/10/2002, at 3 p.m.   DISCHARGE DIAGNOSES:  1. Nonischemic chest pain.     a. Nonobstructive three-vessel coronary artery disease with patent        stents; normal left ventricular function.  Coronary angiogram        04/12/2002.  b. Status post pecutaneous coronary intervention left anterior descending        artery/right coronary artery in 1997; percutaneous intervention right        coronary artery (new site) 2000.  2. Peripheral vascular disease with history of 40% left renal artery     stenosis in 1997.  3. Dyslipidemia.  4. Treated hypothyroidism.  5. History of TICLID intolerance.  6. Hypertension.  7. History of gastroesophageal reflux disease.     Gene Serpe, P.A. [facility]                      Luis Abed, M.D. [facility]    GS/MEDQ  D:  04/12/2002  T:  04/14/2002  Job:  [id number]

## 2010-12-11 NOTE — Assessment & Plan Note (Signed)
[facility]                              CARDIOLOGY OFFICE NOTE   Kurt Elliott, Kurt Elliott                     MRN:          [id number]  DATE:05/02/2006                            DOB:          13-Jun-1921    Kurt Elliott is doing very well.  I saw him last in October of 2006.  He  swims 20 laps per day.  He is not having chest pain.  He is a very reliable  historian, and has always contacted [place] when he is having problems.  He is  not having any significant shortness of breath.  He is going about full  activities.   The patient has known coronary disease.  His last catheterization was in  2004.  Cardiolite in 2005 revealed no ischemia.   ALLERGIES:  Question of a rash from TICLID.  He does tolerate Plavix.   MEDICATIONS:  1. Multivitamin.  2. Metoprolol 50 b.i.d.  3. Zocor 5.  4. Benicar 20.  5. PTU 50 mg Monday, Wednesday and Friday.  6. Aspirin 81.  7. Niaspan 500 t.i.d.   MEDICAL PROBLEMS:  See the list below.   REVIEW OF SYSTEMS:  His review of systems at this time is negative.  He has  no significant complaints.   PHYSICAL EXAMINATION:  VITAL SIGNS:  Blood pressure is 120/70 with a pulse  of 58, and the weight is 166.  GENERAL:  The patient is well-nourished and quite stable.  He is oriented to  person, time and place, and his affect is normal.  HEENT:  No xanthelasma.  He has normal extraocular motion.  Carotids reveal  no bruits.  There is no jugular venous distention.  CARDIAC:  An S1 with an S2.  He does have a soft systolic murmur.  The  abdomen is soft.  There are no masses or bruits.  There is no significant  peripheral edema.  There are no musculoskeletal deformities.   ELECTROCARDIOGRAM:  EKG shows no significant change.   I had a complete discussion with Kurt Elliott and his wife about the  findings on his CT scan from last year.  There had been question of a  penetrating ulcer on the ultrasound, and he had a CT scan.  I  will review  all of those results in the problem list below.   PROBLEMS:  1. History of coronary disease with interventions in the past.  The last      cath in 2004 and a Cardiolite in 2005 with no ischemia.  There is no      reason for testing at this time.  2. History of tachycardia related to his hyperthyroidism.  3. Hyperlipidemia that is treated by Dr. Everardo All.  4. Mild renal artery stenosis in 1979.  His CT of the abdomen does show      some renal artery stenosis.  5. History of a rash from Ticlid in 1997, but he tolerates Plavix.  6. History of hip discomfort thought not to be vascular.  7. History of gallbladder surgery with a ruptured gallbladder in 1998.  8. History of  a ruptured diverticulum in 1998.  9. History of an incisional hernia that was repaired.  10.Hypertension, treated.  11.Hyperthyroidism, actively treated.  12.Elevated PSA.  This is known to be elevated.  The patient has twin sons      who are urologists, and the patient is seeing Dr. Hoy Morn in      [place].  13.History of a cough from ACE inhibitor.  He does tolerate ARBs.  14.Soft carotid bruit.  He had carotid ultrasound in December of 2006      which showed only minimal disease bilaterally, and this does not need      to be repeated.  15.Question of irregular plaque and the possibility of a penetrating ulcer      on ultrasound.  CT scan revealed some aortic atheroma but no ulcer, and      this can be followed.  I explained this result to the patient and his      wife at length.  16.High-grade stenosis of the origin of the inferior mesenteric artery      seen on CT scan of the abdomen in 2006.  He has no symptoms, and this      will be followed clinically.  17.Probable hyperdense cyst arising from the lower pole of the left      kidney.   Kurt Elliott is stable.  I have not changed his medicines.  He needs no  further work-up at this time.  I will see him back for followup in 1 year.             ______________________________  Luis Abed, MD, [facility]     JDK/MedQ  DD:  05/02/2006  DT:  05/03/2006  Job #:  [id number]   cc:   [name], MD

## 2010-12-11 NOTE — H&P (Signed)
NAME:  Kurt Elliott, MATO NO.:  [id number]   MEDICAL RECORD NO.:  [id number]                   PATIENT TYPE:  INP   LOCATION:  3737                                 FACILITY:  [facility]   PHYSICIAN:  Luis Abed, M.D. [facility]           DATE OF BIRTH:  1921/07/01   DATE OF ADMISSION:  04/11/2002  DATE OF DISCHARGE:                                HISTORY & PHYSICAL   HISTORY OF PRESENT ILLNESS:  The patient is well known to me and our team.  He is followed by Dr. Everardo All of our group. He is the father-in-law of Dr.  Kathrine Haddock. Of our group. He is 77 years old and has known coronary artery  disease but has been stable and quite active. The past two nights he has had  chest pain while at rest in bed. He took a nitroglycerin this morning. It  was an old one, but it appeared to help, and he came to the hospital. He is  now stable. His first troponin is 0.4.   ALLERGIES:  None known.   MEDICATIONS:  1. Aspirin 325.  2. Zocor 5 mg.  3. Protonix 40.  4. Benicar 40.  5. Lopressor 50 b.i.d.  6. PTU three times per week.   OTHER MEDICAL PROBLEMS:  See the problem list below.   SOCIAL HISTORY:  The patient is married and lives in [place]. He is  retired as a Barrister's clerk in the [employer], and he also has other degrees.   FAMILY HISTORY:  There is a history of brothers with coronary disease.   REVIEW OF SYSTEMS:  He has had no fevers or chills. There is no HEENT  problems. He has had a skin cancer removed. He had some shortness of breath  recently. He had chest discomfort this morning as mentioned. He has no GU or  GI problems. He has some mild arthralgias. There is some GERD, but it is  controlled with medications. He sees Dr. Everardo All regularly for his followup  of his blood pressure and his hypothyroidism.   PHYSICAL EXAMINATION:  VITAL SIGNS:  Temperature 97.2, pulse 85,  respirations 18, blood pressure 160/72 which is higher than his usual  pressure. He  is in no distress at this time.  HEENT:  Reveals no marked abnormalities.  NECK:  Reveals no bruits.  CARDIAC EXAM:  Reveals a S1 with a S2.  ABDOMEN:  Benign. Soft.  LUNGS:  Clear.  SKIN:  He has a healing surgical wound on his upper back from a skin  procedure.  GENITOURINARY/RECTAL:  Deferred.  EXTREMITIES:  He has no edema.  NEUROLOGIC:  Grossly intact.   LABORATORY DATA:  Chest x-ray is pending at this time. EKG reveals no acute  changes. His labs at this point reveal a good hemoglobin. Renal function is  good. His first troponin is 0.4.   PROBLEM LIST:  1. Hyperthyroidism for which he  is on PTU, and it is controlled.  2. Coronary artery disease. The patient has had interventions in the past.     It is now time to proceed with repeat catheterization. We will obtain my     office records.                                               Luis Abed, M.D. [facility]    JDK/MEDQ  D:  04/11/2002  T:  04/12/2002  Job:  [id number]

## 2010-12-11 NOTE — Discharge Summary (Signed)
NAMEARLING, CERONE NO.:  [id number]   MEDICAL RECORD NO.:  [id number]                   PATIENT TYPE:  INP   LOCATION:  3737                                 FACILITY:  [facility]   PHYSICIAN:  [name], M.D.                  DATE OF BIRTH:  April 30, 1921   DATE OF ADMISSION:  04/02/2003  DATE OF DISCHARGE:  04/03/2003                           DISCHARGE SUMMARY - REFERRING   DISCHARGE DIAGNOSIS:  Admitted with chest pain relieved with nitroglycerin.  Status post left heart catheterization. No ischemic source identified.  Stents placed in the proximal right coronary artery, the posterolateral  branch, and the mid LAD are all patent with either 20% or less than 20% in  stent stenosis.  Currently the patient is chest pain-free. TSH 1.285, within  normal limits.   SECONDARY DIAGNOSIS:  1. Admitted chest pain relieved with nitroglycerin.  2. History of coronary artery disease with stenting in the mid LAD, proximal     RCA, and posterolateral branch. Last catheterization September 2003.  3. Status post left heart catheterization September 7 with patent stents. No     source of ischemia. Ejection fraction 70%, 40% left subclavian stenosis     noted.  4. Hypertension.  5. Gastroesophageal reflux disease.  6. Grave's disease/hypothyroidism. TSH within normal limits.   PROCEDURE:  April 02, 2003 left heart catheterization, possible PCI by  way of the right femoral artery, Dr. [name]. The study showed that the  left anterior descending was calcified with less than 20% in stent stenosis.  Had a stent in the mid LAD. There was a 30% stenosis after the stent in the  LAD. The second diagonal has a 50% stenosis proximally. The left circumflex  has a 40% stenosis in the first obtuse marginal. The right coronary artery  has a less than 20% in stent stenosis of the proximal stent, a 40% mid point  stenosis. There is a less than 20% in stent stenosis in the  posterolateral  branch. Ejection fraction 70%. Left subclavian artery 40% stenosis. Left  ventricular function normal, no source of ischemia. The patient to follow up  with Dr. Myrtis Ser. D. dimer study is 0.28, which is within normal limits. Once  again, thyroid stimulating hormone also within normal limits.   BRIEF HISTORY:  Mr. Mia is an 77 year old male who is quite active. He  swims actively. He has a one week history of anterior chest aching, which  also involves the posterior neck, it lasted for about 30 minutes to one  hours. It does not increase with exertional or positional change. When  swimming it affects him every day. He rates it as a 5/10. At first he  attributed it to thyroid. On the morning of September 7 after arising he  developed chest pain with shortness of breath. The pain was an 8/10. He saw  his primary care physician, Dr.  Everardo All and received one sublingual  nitroglycerin with relief. Mr. Heidelberg claims the pain is different from  that prior to his initial stenting. He had decreased exercise tolerance  prior to that.   PLAN:  Admission and left heart catheterization.   HOSPITAL COURSE:  The patient was admitted September 7 with chest pain  relieved with nitroglycerin. He said it had been bothering him for a week  and was somewhat associated with exertion. He underwent left heart  catheterization September 7. The study showed that there was no source of  ischemia as dictated above. The stents were all patent with less than 20%  stenosis. The patient is chest pain-free at the time of discharge. His post  catheterization CBC is hemoglobin 14.9, hematocrit 42.3, platelets 172,  white cells 7.2. D. dimer 0.28, TSH 1.285. He had liver function studies  taken on admission. Alkaline phosphatase 54, SGOT 36, SGPT 34. He also had  cardiac enzymes, they are as follows:  September 7 at 1300 hours CK 63, CK-  MB 2.1; September 7 at 2000 CK 60, CK-MB 2.1; September 8 at 0500  CK 55, CK-  MB 1.7. These are nondiagnostic. EKG shows sinus rhythm with a rate of 75  beats per minute. There are no ST elevations. No T-wave abnormalities. Some  Q-waves in lead 3, but these are not repeated in leads 2 or AVF.      Maple Mirza, P.A.                    [name], M.D.    GM/MEDQ  D:  04/03/2003  T:  04/03/2003  Job:  [id number]   cc:   [name], M.D.   [name], M.D. [facility]

## 2010-12-11 NOTE — H&P (Signed)
NAMEPROMISE, BUSHONG NO.:  [id number]   MEDICAL RECORD NO.:  [id number]                   PATIENT TYPE:  INP   LOCATION:  3737                                 FACILITY:  [facility]   PHYSICIAN:  [name], M.D. [facility]           DATE OF BIRTH:  1920-10-23   DATE OF ADMISSION:  04/02/2003  DATE OF DISCHARGE:                                HISTORY & PHYSICAL   ATTENDING PHYSICIAN:  [name], M.D. [facility]   REASON FOR ADMISSION:  Shortness of breath.   HISTORY OF PRESENT ILLNESS:  The patient is an 77 year old man with four  days of moderate palpitations in the chest, worse today, constant.  There is  associated shortness of breath.  Unable to cite specific precipitating  context.   PAST MEDICAL HISTORY:  1. Coronary artery disease.  2. Dyslipidemia.  3. Hypertension.  4. GERD.  5. Elevated PSA.  6. Hyperthyroidism.  7. Spinal stenosis.  8. History of cough due to ACE inhibitor.   MEDICATIONS:  1. Coated aspirin 325 mg daily.  2. Multivitamin one daily.  3. Metoprolol 50 mg b.i.d.  4. Zocor 5 mg daily.  5. Protonix 40 mg daily.  6. Benicar 20 mg daily.  7. Niaspan 1000 mg daily.  8. PTU 50 mg three times a week (Monday, Wednesday, and Friday).   SOCIAL HISTORY:  The patient is married, retired, never been a smoker.   FAMILY HISTORY:  Negative for the above symptoms.   REVIEW OF SYSTEMS:  Denies the following:  Fever, change in weight,  myalgias, syncope, chest pain, excessive diaphoresis, nausea, vomiting,  cough, rectal bleeding, hematuria, and tremor.   PHYSICAL EXAMINATION:  VITAL SIGNS:  Blood pressure 180/100, respiratory  rate 20, heart rate 72, temperature 97.7.  GENERAL:  No distress.  SKIN:  Not diaphoretic.  HEENT:  Head is atraumatic.  Sclerae nonicteric.  Pharynx clear.  NECK:  Supple, no goiter.  CHEST:  Clear to auscultation, no respiratory distress.  CARDIOVASCULAR:  No JVD, no edema.  Regular rate and rhythm, no  murmur.  I  do not appreciate any significant variability in his heart rate during my  auscultation today.  Pedal pulses are intact.  Carotid arteries show no  bruit.  ABDOMEN:  Soft, nontender.  No hepatosplenomegaly, no mass.  EXTREMITIES:  Osteoarthritic changes, but no other obvious abnormality.  NEUROLOGICAL:  Alert, well oriented, does not appear anxious nor depressed,  and there is no tremor.   LABORATORY DATA:  Electrocardiogram in the office shows possible right  ventricular hypertrophy, normal sinus rhythm with a heart rate of 74.   IMPRESSION:  1. Palpitations which could be due to his hyperthyroidism versus     dysrhythmia.  2. Shortness of breath could also be due to hyperthyroidism but anginal     equivalent should be considered.  3. Other chronic medical problems as noted above.   PLAN:  1. Admit to [facility].  2. Check TSH and CPKs.  3. Routine admission laboratory studies.  4. Check chest x-ray.  5. Consult Cardiology.  6. Empiric oxygen.  7. I discussed code status with the patient.  He states he wants to be full     code, but would not want to be started nor maintained on artificial life     support systems if there was not a reasonable chance of a functional     recovery.                                                [name], M.D. [facility]    SAE/MEDQ  D:  04/02/2003  T:  04/02/2003  Job:  [id number]

## 2010-12-11 NOTE — Cardiovascular Report (Signed)
NAME:  Kurt Elliott, Kurt Elliott NO.:  [id number]   MEDICAL RECORD NO.:  [id number]                   PATIENT TYPE:  INP   LOCATION:  3737                                 FACILITY:  [facility]   PHYSICIAN:  [name], M.D.                  DATE OF BIRTH:  [birth year]   DATE OF PROCEDURE:  04/02/2003  DATE OF DISCHARGE:                              CARDIAC CATHETERIZATION   CLINICAL HISTORY:  Kurt Elliott is a very nice 77 year old gentleman who is  the father-in-law of Kurt Elliott. Kurt Elliott, M.D. [facility].  He has had multiple  previous interventions including stenting of the LAD, proximal right  coronary artery, and posterolateral branch of the right coronary artery.  His last intervention was in 2000.  His last catheterization was performed  in September 2003 by Kurt Elliott, M.D. at which time he had no evidence  of recurrence.  Over the past week he has had symptoms of feeling of  pounding in his chest and questionable associated chest discomfort.  He was  seen today by Kurt Elliott, M.D. [facility] because he thought the pounding  might be related to his thyroid problem and Kurt Elliott, M.D. [facility] gave  him nitroglycerin with relief and arranged for him to come to the hospital  for further evaluation with angiography.   PROCEDURE:  The procedure was performed via the right femoral artery using  arterial sheath and 6-French preformed coronary catheters.  A front wall  arterial puncture was performed and Omnipaque contrast was used.  A distal  aortogram was performed to evaluate his known renal artery stenosis.  The  patient tolerated the procedure well and left the laboratory in satisfactory  condition.   RESULTS:  The aortic pressure was 162/81 with a mean of 115.   Left ventricular pressure was 162/14.   Left main coronary artery was free of significant disease.   Left anterior descending artery gave rise to three diagonal branches and  three septal perforators.   There was moderately heavy calcification in the  proximal LAD.  There was 50% narrowing in the second diagonal branch.  There  was less than 20% narrowing at the stent site in the proximal to mid LAD.  There was 30% narrowing in the mid LAD.   Circumflex artery gave rise to a small intermediate branch, a large marginal  branch, and a small AV branch.  There was 40% narrowing in the proximal  portion of the marginal branch.   Right coronary artery was a dominant vessel.  Gave rise to two right  ventricular branches, a posterior descending branch, and a posterolateral  branch.  There was less than 20% narrowing at the stent site in the proximal  right coronary artery.  There was 40% narrowing in the mid right coronary  artery.  There was less than 20% narrowing at the stent site in the  posterolateral  branch of the right coronary artery.  There were  irregularities in the distal right coronary artery.   LEFT VENTRICULOGRAM:  The left ventriculogram performed in the RAO  projection showed good wall motion with no areas of hypokinesis.  The  estimated ejection fraction was 70.   DISTAL AORTOGRAM:  Distal aortogram was performed which showed patent renal  arteries with 40% narrowing in the left renal artery.  There were  irregularities in the distal aorta, but no significant aortoiliac  obstruction.   CONCLUSIONS:  Coronary artery disease status post multiple prior  percutaneous interventions with less than 20% narrowing at the stent site in  the proximal mid left anterior descending, 30% narrowing in the mid left  anterior descending, 50% narrowing in a small diagonal branch, 40% narrowing  in the circumflex marginal vessel, less than 20% narrowing in the stent in  the proximal right coronary artery, and less than 20% narrowing in the stent  in the posterolateral branch of the right coronary artery with 40% narrowing  in the mid right coronary artery and normal left ventricular  function.   RECOMMENDATIONS:  There is no restenosis at any of those three stent sites  and there does not appear to be any significant obstructive coronary artery  disease, although there is quite a bit of nonobstructive disease.  There is  no obvious source of ischemia.  I suspect in view of these findings that the  patient's recent symptoms are not ischemic.  I discussed the findings with  Kurt Elliott, M.D. and Kurt Elliott. Kurt Elliott, M.D. [facility].  Will check a D-dimer to  screen for pulmonary embolism, although I think this is unlikely and his O2  saturations in the laboratory were 96 and 97%.  Some of his symptoms may  relate to his hyperthyroidism which is currently under treatment by Kurt Signs A.  Everardo Elliott, M.D. [facility] and we will ask him to address this.                                               [name], M.D.    BB/MEDQ  D:  04/02/2003  T:  04/03/2003  Job:  [id number]   cc:   Kurt Elliott, M.D. [facility]   Kurt Elliott, M.D.   Kurt Elliott. Kurt Elliott, M.D. [facility]   CP Lab

## 2010-12-11 NOTE — Discharge Summary (Signed)
[facility]  Patient:    Kurt Elliott, Kurt Elliott Visit Number: [id number] MRN: [id number]          Service Type: SUR Location: 4W 0450 01 Attending Physician:  Brandy Hale Dictated by:   Angelia Mould. Derrell Lolling, M.D. Admit Date:  07/28/2001 Discharge Date: 07/30/2001   CC:         [name]. [name], M.D. [facility]  Luis Abed, M.D. [facility] Darrelyn Hillock, M.D. [facility]   Discharge Summary  DISCHARGE DIAGNOSES: 1. Symptomatic ventral hernia. 2. Coronary artery disease, stable, status post percutaneous transluminal    coronary angioplasty with stent x 3. 3. Status post two stage resection of sigmoid colon for acute diverticulitis. 4. Status post open cholecystectomy for acute cholecystitis. 5. Gastroesophageal reflux disease, mild.  PROCEDURE:  Repair of ventral incisional hernia with mesh on July 28, 2001.  HISTORY OF PRESENT ILLNESS:  This is a 77 year old, white male who has undergone numerous abdominal operations as described above.  He has developed a progressive bulge and some discomfort in the left side of his abdomen at the site of his old colostomy.  On examination, he has a reducible hernia there. This has been followed for some time and he has been contemplating have this repaired.  He finally decided that it has been enlarging and he wanted to have something done before he developed a complication.  PHYSICAL EXAMINATION:  GENERAL:  This is a pleasant, older, white man in minimal distress.  LUNGS:  Clear to auscultation.  HEART:  Regular rate and rhythm with no murmur.  ABDOMEN:  Soft and nontender.  Well-healed midline scar from the xiphoid down to the pubis.  No signs of hernia there.  There is a reducible hernia in the left mid abdomen at the old colostomy site.  The hernia sac itself is at least 5 cm in diameter, but the defect of the fascia feels a little bit less.  This is completely reducible while supine.  HOSPITAL COURSE:   On the day of admission, the patient came to the operating room and under general anesthesia underwent repair of his ventral hernia. This was done as an anterior approach with closure of the defect and onlay graft appropriately with mesh to the rectus fascia.  This surgery was uneventful.  Postoperatively, the patient did well.  He progressed in his diet and activities slowly, but steadily and was able to be discharged on July 30, 2001.  At that time, he was having minimal pain, tolerating his diet up and around and had no incisional problems.  FOLLOWUP:  He was asked to return to see me in the office in three days.  DISCHARGE MEDICATIONS: 1. He was to resume all of his usual medications. 2. Tylox for pain. Dictated by:   Angelia Mould. Derrell Lolling, M.D. Attending Physician:  Brandy Hale DD:  08/06/01 TD:  08/07/01 Job: [id number]

## 2010-12-11 NOTE — Discharge Summary (Signed)
NAMEFREDDRICK, GLADSON NO.:  [id number]   MEDICAL RECORD NO.:  [id number]                   PATIENT TYPE:  INP   LOCATION:  3737                                 FACILITY:  [facility]   PHYSICIAN:  Rene Paci, M.D. [facility]          DATE OF BIRTH:  Dec 01, 1920   DATE OF ADMISSION:  04/02/2003  DATE OF DISCHARGE:  04/03/2003                                 DISCHARGE SUMMARY   DISCHARGE DIAGNOSES:  1. Chest pain.  2. Dyspnea.   BRIEF ADMISSION HISTORY:  Mr. Vincelette is an 77 year old white male who was  seen in the office by Dr. Everardo All today.  He had described a one-week  history of anterior chest-aching that radiated to his posterior neck,  lasting 30 minutes to an hour at a time.  The pain occurred at rest and with  exertion.  He rated this pain at a 5/10.  On the day of admission, the  patient also described shortness of breath.  The patient did receive one  sublingual nitroglycerin with some relief.  The patient was admitted for  further evaluation.   PAST MEDICAL HISTORY:  1. Hypertension.  2. Gastroesophageal reflux disease.  3. Hyperthyroidism.  4. Graves' disease.  5. Coronary artery disease.  His last cath was on September 2003.  At that     time, patient had patent stents.  6. History of ruptured diverticula requiring colostomy that was reversed in     1998.  7. History of a ventral hernia repair.  8. Spinal stenosis.  9. Status post cholecystectomy.   HOSPITAL COURSE:  #1.  CARDIOVASCULAR:  The patient was admitted with chest  pain to rule out myocardial infarction.  The patient was seen in  consultation by cardiology, who recommended the cardiac catheterization.  Catheterization was performed by Dr. Huston Foley on April 02, 2003.  This  revealed calcification, less than 20% , of the left anterior descending;  circumflex was 40% to OM, right coronary artery less than 20% to the  proximal stent, 40% mid, and less than 20% stent to the  posterolateral.  There was no source of ischemia identified.   #2:  ENDOCRINE:  The patient has Graves' disease and is on PTU.  The  patient's thyroid-stimulating hormone was 1.285 and normal.   #3.  PULMONARY:  Dyspnea without any hypoxemia.  No cardiac source was  identified.  There was no evidence of a pulmonary source either.  The  patient's D-dimer was normal, and no evidence of pulmonary embolism.   #4.  HYPERTENSION:  This is stable.   #5.  GASTROESOPHAGEAL REFLUX DISEASE:  Also stable.   Symptomatically, the patient is improved, and there is no further workup  indicated at this time.   LABORATORIES AT DISCHARGE:  CBC was normal.  CMET was normal except for a  total bilirubin of 1.3.  Coags are normal.  TSH was 1.285.  D-dimer was less  than 0.28.   MEDICATIONS AT DISCHARGE:  1. Enteric-coated aspirin 81 mg q.d.  2. Lopressor 50 mg b.i.d.  3. Benicar 40 mg q.d.  4. Protonix 40 mg q.d.  5. Zocor 5 mg q.h.s.  6. Niaspan 1000 mg q.d.  7. PTU 50 mg on Monday, Wednesday and Friday.   FOLLOW UP:  1. The patient has been instructed to follow up with Dr. Myrtis Ser at noon on     Wednesday, April 17, 2003.  2. Dr. Everardo All in 1-2 months, to followup his TSH.      Cornell Barman, P.A. [facility]                  Rene Paci, M.D. [facility]    LC/MEDQ  D:  04/03/2003  T:  04/03/2003  Job:  [id number]   cc:   Myrtis Ser, M.D.   [name], M.D. [facility]

## 2010-12-11 NOTE — Cardiovascular Report (Signed)
   NAMECLIFFTON, SPRADLEY NO.:  [id number]   MEDICAL RECORD NO.:  [id number]                   PATIENT TYPE:  INP   LOCATION:  3737                                 FACILITY:  [facility]   PHYSICIAN:  Rollene Rotunda, MD [facility]              DATE OF BIRTH:  1921-01-05   DATE OF PROCEDURE:  04/12/2002  DATE OF DISCHARGE:                              CARDIAC CATHETERIZATION   DATE OF BIRTH:  1920-08-07   PRIMARY CARE PHYSICIAN:  [name], M.D.   PROCEDURE:  Left heart catheterization/coronary arteriography.   INDICATIONS:  Evaluate patient with coronary disease, status post stenting  of his LAD, proximal RCA and PDA.   DESCRIPTION OF PROCEDURE:  Left heart catheterization was performed via the  right femoral artery. The artery was cannulated using an  anterior wall  puncture. A #6 French arterial sheath was inserted via the modified  Seldinger technique. Preformed Judkins and a pigtail catheter were utilized.  The patient tolerated the procedure well and left the lab in stable  condition.   HEMODYNAMICS:  LV 147/17, AO 141/60.   CORONARY ARTERIES:  The left main normal.   The LAD had ostial 25% stenosis.  There was a proximal stent that was patent  with minimal luminal irregularities. There was mid diffuse 25% stenosis.  There was a mid diagonal that was small with ostial 50% stenosis.   The circumflex had diffuse luminal irregularities. The mid obtuse marginal  was large with ostial 30% stenosis.   The right coronary artery included a proximal stent with 25% in-stent re-  stenosis.  There was a mid 30% stenosis. The PDA had an ostial 40% lesion.  The proximal stent in the PDA was patent with luminal irregularities.   LEFT VENTRICULOGRAM:  The left ventriculogram was obtained in the RAO  projection. The EF was 60% with well preserved wall motion.   CONCLUSION:  Nonobstructive three-vessel coronary artery disease with patent  stents.  Well  preserved ejection fraction.   PLAN:  The patient will continue to have medical management.                                                          Rollene Rotunda, MD [facility]    JH/MEDQ  D:  04/12/2002  T:  04/13/2002  Job:  [id number]   cc:   [name], M.D. [facility]

## 2010-12-11 NOTE — Op Note (Signed)
[facility]  Patient:    Kurt Elliott, Kurt Elliott Visit Number: [id number] MRN: [id number]          Service Type: SUR Location: 4W 0450 01 Attending Physician:  Brandy Hale Dictated by:   Angelia Mould. Derrell Lolling, M.D. Proc. Date: 07/28/01 Admit Date:  07/28/2001   CC:         [name]. [name], M.D. [facility]  Luis Abed, M.D. [facility] Darrelyn Hillock, M.D. [facility]   Operative Report  PREOPERATIVE DIAGNOSIS:  Ventral incisional hernia.  POSTOPERATIVE DIAGNOSIS:  Ventral incisional hernia.  OPERATION:  Open repair of ventral incisional hernia with onlay polypropylene mesh.  SURGEON:  Angelia Mould. Derrell Lolling, M.D.  OPERATIVE INDICATIONS:  This is a 77 year old white man who underwent emergent Hartmann resection for ruptured diverticulitis in December of 1998. Three months later, he underwent elective resection and closure of his colostomy. He had moderately severe adhesions at that time. He developed acute cholecystitis several weeks later and underwent open cholecystectomy through an upper midline incision. He has done well since that time. He has been troubled by a hernia in the left abdomen at the colostomy closure site for over one year. This has been getting larger and although minimally painful, it has been getting larger and he is concerned about incarceration or strangulation. Because of his multiple procedures and the very localized nature of this hernia, it was elected to repair this an open repair anteriorly. He is brought to the operating room electively.  OPERATIVE TECHNIQUE:  Following the induction of general endotracheal anesthesia, the patients abdomen was prepped and draped in sterile fashion. A transverse incision was made in the left mid abdomen through the old transverse colostomy closure site. The incision was extended medially and laterally a bit so that the incision itself was probably 10 to 12 cm in length. Dissection was carried  down through the subcutaneous tissue. We identified the hernia sac and entered this cautiously. Some omental adhesions were present and these were carefully taken down under direct vision and the omentum returned to the abdominal cavity. We found that the defect in the fascia was about 4 cm in diameter and medial to this, there was another 1.5-cm defect as well. We undermined the subcutaneous away from the anterior rectus sheath for a distance of about 3 to 4 cm in all directions. The defect in the fascia was closed with interrupted sutures of #1 Novafil and this repaired the fascia quite nicely.  We repaired and reinforced the repair with an onlay graft of polypropylene mesh. We brought a 6-inch x 6-inch piece of polypropylene mesh to the operative field and cut this down to about a 5-inch x 4-1/2-inch elliptical shaped mesh. This was sutured in place with about 10 or 12 interrupted mattress sutures of 0 Prolene. These were all placed individually being careful to take an adequate bite of the anterior rectus sheath in each case. Mesh spread out quite nicely, covered the defect quite extensively. There was no bleeding. The wound was irrigated with saline. There was no bleeding whatsoever. A 19-French Blake drain was placed in the wound and brought out through a small stab incision in the midline below the umbilicus. The drain was sutured to the skin and connected to a suction bulb. Subcutaneous tissue was closed with interrupted sutures of 2-0 Vicryl and the skin closed with skin staples. Clean bandages were placed and the patient was taken to the recovery room in stable condition. Estimated blood loss was about 30-40  cc. Complications none. Sponge, needle, and instrument counts were correct. Dictated by:   Angelia Mould. Derrell Lolling, M.D. Attending Physician:  Brandy Hale DD:  07/28/01 TD:  07/28/01 Job: [id number]

## 2010-12-11 NOTE — Consult Note (Signed)
NAME:  Kurt Elliott, Kurt Elliott NO.:  [id number]   MEDICAL RECORD NO.:  [id number]                   PATIENT TYPE:  INP   LOCATION:  3737                                 FACILITY:  [facility]   PHYSICIAN:  [name], M.D.                  DATE OF BIRTH:  March 26, 1921   DATE OF CONSULTATION:  04/02/2003  DATE OF DISCHARGE:                                   CONSULTATION   This 77 year old gentleman is extremely well-known to me.  He is admitted  today by Dr. Everardo All with some chest pain.  The patient has known coronary  disease.  Recently he had some discomfort while swimming.  Today he had an  onset that persisted, and Dr. Everardo All was quite concerned.  He was put in an  ambulance and on the way over here, he received nitroglycerin with relief.  He does have known coronary disease with previous stents.   PAST MEDICAL HISTORY:  Other medical problems:  See the complete list below.   ALLERGIES:  ADHESIVE TAPE.   MEDICATIONS:  1. PTU Monday, Wednesday, Friday for hyperthyroidism.  2. Lopressor 50 mg b.i.d.  3. Aspirin 81 mg.  4. Multivitamin.  5. Benicar 40 mg daily.  6. Nitroglycerin p.r.n.  7. Protonix 40 mg daily.  8. Zocor 5 mg.  9. Niaspan 1000 mg.   SOCIAL HISTORY:  The patient lives in [place] with his wife of 56 years.  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  He has multiple children,  including his daughter, Venetia Maxon, wife of [name]. [name], M.D., of our  GI team.   FAMILY HISTORY:  Noncontributory.   REVIEW OF SYSTEMS:  He has had no fevers or chills.  He has been having some  dental work recently.  He has no skin rashes.  He had chest pain as  mentioned today.  He is not having any GU symptoms.  He is not having any  major GI symptoms at this time.  He has some hip pain.  The remainder of his  review of systems is negative.   PHYSICAL EXAMINATION:  VITAL SIGNS:  Temperature 97.1, pulse is 70,  respirations 18, and blood  pressure 130/68.  GENERAL:  He appears stable at this time with no pain.  HEENT:  No marked abnormalities.  NECK:  He has no neck bruits.  CARDIAC:  An S1 with an S2 but no clicks or significant murmurs.  ABDOMEN:  Benign.  SKIN:  There are no skin rashes.  GENITOURINARY, RECTAL:  Deferred.  EXTREMITIES:  He has no peripheral edema.  NEUROLOGIC:  Grossly intact.  MUSCULOSKELETAL:  There are no significant musculoskeletal deformities.   Chest x-ray, a portable, is pending.  EKG shows no diagnostic changes.  Hemoglobin is 15.  BUN of 12 and creatinine of 1.2.  His first CPK is 63.  PT and PTT are pending.  PROBLEMS:  1. History of hypertension.  2. Gastroesophageal reflux disease.  3. Hyperthyroidism, treated long-term with PTU and followed by Dr. Everardo All     for this.  4. Colon surgery and ventral hernia repair.  5. Status post cholecystectomy.  6. * Coronary disease.  The patient has had interventions in the past, and     his last catheterization was in September 2003.  He has normal left     ventricular function.  Today he has had some chest discomfort, and we     will proceed with catheterization today.                                               [name], M.D.    Cleotis Lema  D:  04/02/2003  T:  04/02/2003  Job:  [id number]   cc:   [name], M.D. [facility]

## 2010-12-16 ENCOUNTER — Encounter (INDEPENDENT_AMBULATORY_CARE_PROVIDER_SITE_OTHER): Payer: Medicare Other | Admitting: Cardiology

## 2010-12-16 DIAGNOSIS — I6529 Occlusion and stenosis of unspecified carotid artery: Secondary | ICD-10-CM

## 2010-12-16 DIAGNOSIS — R0989 Other specified symptoms and signs involving the circulatory and respiratory systems: Secondary | ICD-10-CM

## 2010-12-18 ENCOUNTER — Encounter: Payer: Self-pay | Admitting: Cardiology

## 2010-12-29 ENCOUNTER — Telehealth: Payer: Self-pay | Admitting: Cardiology

## 2010-12-29 NOTE — Telephone Encounter (Signed)
Pt would like carotid results

## 2010-12-29 NOTE — Telephone Encounter (Signed)
Pt given results of recent carotid doppler.  

## 2011-01-08 ENCOUNTER — Other Ambulatory Visit: Payer: Self-pay | Admitting: Oncology

## 2011-01-08 ENCOUNTER — Encounter (HOSPITAL_BASED_OUTPATIENT_CLINIC_OR_DEPARTMENT_OTHER): Payer: Medicare Other | Admitting: Oncology

## 2011-01-08 DIAGNOSIS — Z5111 Encounter for antineoplastic chemotherapy: Secondary | ICD-10-CM

## 2011-01-08 DIAGNOSIS — C61 Malignant neoplasm of prostate: Secondary | ICD-10-CM

## 2011-01-08 LAB — COMPREHENSIVE METABOLIC PANEL
AST: 55 U/L — ABNORMAL HIGH (ref 0–37)
Albumin: 4.3 g/dL (ref 3.5–5.2)
Alkaline Phosphatase: 87 U/L (ref 39–117)
BUN: 20 mg/dL (ref 6–23)
Creatinine, Ser: 1.21 mg/dL (ref 0.50–1.35)
Potassium: 4.2 mEq/L (ref 3.5–5.3)

## 2011-01-08 LAB — CBC WITH DIFFERENTIAL/PLATELET
BASO%: 0.3 % (ref 0.0–2.0)
Basophils Absolute: 0 10*3/uL (ref 0.0–0.1)
EOS%: 3.3 % (ref 0.0–7.0)
HGB: 12.5 g/dL — ABNORMAL LOW (ref 13.0–17.1)
MCH: 33.9 pg — ABNORMAL HIGH (ref 27.2–33.4)
MCHC: 35.5 g/dL (ref 32.0–36.0)
MCV: 95.5 fL (ref 79.3–98.0)
MONO%: 5.1 % (ref 0.0–14.0)
RDW: 12.7 % (ref 11.0–14.6)

## 2011-01-08 LAB — TESTOSTERONE: Testosterone: 168.82 ng/dL — ABNORMAL LOW (ref 250–890)

## 2011-01-08 LAB — PSA: PSA: 0.23 ng/mL (ref <=4.00)

## 2011-01-09 ENCOUNTER — Other Ambulatory Visit: Payer: Self-pay | Admitting: Cardiology

## 2011-01-18 ENCOUNTER — Other Ambulatory Visit: Payer: Self-pay | Admitting: Endocrinology

## 2011-02-22 ENCOUNTER — Encounter (HOSPITAL_BASED_OUTPATIENT_CLINIC_OR_DEPARTMENT_OTHER): Payer: Medicare Other | Admitting: Oncology

## 2011-02-22 ENCOUNTER — Other Ambulatory Visit: Payer: Self-pay | Admitting: Oncology

## 2011-02-22 DIAGNOSIS — C61 Malignant neoplasm of prostate: Secondary | ICD-10-CM

## 2011-02-22 LAB — CBC WITH DIFFERENTIAL/PLATELET
BASO%: 0.4 % (ref 0.0–2.0)
Eosinophils Absolute: 0.2 10*3/uL (ref 0.0–0.5)
LYMPH%: 23.2 % (ref 14.0–49.0)
MCHC: 35.1 g/dL (ref 32.0–36.0)
MONO#: 0.4 10*3/uL (ref 0.1–0.9)
NEUT#: 3.6 10*3/uL (ref 1.5–6.5)
RBC: 3.98 10*6/uL — ABNORMAL LOW (ref 4.20–5.82)
RDW: 12.8 % (ref 11.0–14.6)
WBC: 5.6 10*3/uL (ref 4.0–10.3)
lymph#: 1.3 10*3/uL (ref 0.9–3.3)

## 2011-02-22 LAB — COMPREHENSIVE METABOLIC PANEL
ALT: 44 U/L (ref 0–53)
Albumin: 4.1 g/dL (ref 3.5–5.2)
CO2: 28 mEq/L (ref 19–32)
Chloride: 103 mEq/L (ref 96–112)
Potassium: 4.3 mEq/L (ref 3.5–5.3)
Sodium: 138 mEq/L (ref 135–145)
Total Bilirubin: 0.8 mg/dL (ref 0.3–1.2)
Total Protein: 7.3 g/dL (ref 6.0–8.3)

## 2011-03-02 ENCOUNTER — Encounter (HOSPITAL_BASED_OUTPATIENT_CLINIC_OR_DEPARTMENT_OTHER): Payer: Medicare Other | Admitting: Oncology

## 2011-03-02 DIAGNOSIS — Z5111 Encounter for antineoplastic chemotherapy: Secondary | ICD-10-CM

## 2011-03-02 DIAGNOSIS — C61 Malignant neoplasm of prostate: Secondary | ICD-10-CM

## 2011-04-06 ENCOUNTER — Other Ambulatory Visit: Payer: Self-pay | Admitting: Cardiology

## 2011-04-14 ENCOUNTER — Other Ambulatory Visit: Payer: Self-pay | Admitting: Cardiology

## 2011-04-14 ENCOUNTER — Other Ambulatory Visit: Payer: Self-pay | Admitting: Endocrinology

## 2011-04-19 ENCOUNTER — Other Ambulatory Visit: Payer: Self-pay | Admitting: Gastroenterology

## 2011-04-19 ENCOUNTER — Telehealth: Payer: Self-pay | Admitting: Cardiology

## 2011-04-19 NOTE — Telephone Encounter (Signed)
Pt wants benicar 5 mg he takes 2 per day and he just picked up rx at [facility] [address]  1 per day and only 90 pills instead of 180 please call and verify dosage

## 2011-04-20 MED ORDER — OLMESARTAN MEDOXOMIL 5 MG PO TABS: 10.0000 mg | ORAL_TABLET | Freq: Every day | ORAL | Status: DC

## 2011-04-28 ENCOUNTER — Ambulatory Visit (INDEPENDENT_AMBULATORY_CARE_PROVIDER_SITE_OTHER): Payer: Medicare Other | Admitting: *Deleted

## 2011-04-28 DIAGNOSIS — Z23 Encounter for immunization: Secondary | ICD-10-CM

## 2011-05-03 ENCOUNTER — Other Ambulatory Visit: Payer: Self-pay | Admitting: Gastroenterology

## 2011-05-03 ENCOUNTER — Other Ambulatory Visit: Payer: Self-pay | Admitting: Endocrinology

## 2011-05-07 ENCOUNTER — Other Ambulatory Visit: Payer: Self-pay | Admitting: Endocrinology

## 2011-05-17 ENCOUNTER — Other Ambulatory Visit: Payer: Self-pay | Admitting: Gastroenterology

## 2011-05-17 MED ORDER — PANTOPRAZOLE SODIUM 40 MG PO TBEC: 40.0000 mg | DELAYED_RELEASE_TABLET | Freq: Every day | ORAL | Status: DC

## 2011-05-25 ENCOUNTER — Encounter (HOSPITAL_BASED_OUTPATIENT_CLINIC_OR_DEPARTMENT_OTHER): Payer: Medicare Other | Admitting: Oncology

## 2011-05-25 ENCOUNTER — Other Ambulatory Visit: Payer: Self-pay | Admitting: Oncology

## 2011-05-25 DIAGNOSIS — C61 Malignant neoplasm of prostate: Secondary | ICD-10-CM

## 2011-05-25 LAB — COMPREHENSIVE METABOLIC PANEL
AST: 63 U/L — ABNORMAL HIGH (ref 0–37)
Alkaline Phosphatase: 93 U/L (ref 39–117)
BUN: 20 mg/dL (ref 6–23)
Creatinine, Ser: 1.22 mg/dL (ref 0.50–1.35)
Total Bilirubin: 0.8 mg/dL (ref 0.3–1.2)

## 2011-05-25 LAB — CBC WITH DIFFERENTIAL/PLATELET
BASO%: 0.2 % (ref 0.0–2.0)
Basophils Absolute: 0 10*3/uL (ref 0.0–0.1)
EOS%: 4.6 % (ref 0.0–7.0)
HCT: 36.8 % — ABNORMAL LOW (ref 38.4–49.9)
LYMPH%: 23.6 % (ref 14.0–49.0)
MCH: 32.6 pg (ref 27.2–33.4)
MCHC: 35.6 g/dL (ref 32.0–36.0)
MCV: 91.5 fL (ref 79.3–98.0)
MONO%: 5.3 % (ref 0.0–14.0)
NEUT%: 66.3 % (ref 39.0–75.0)
Platelets: 174 10*3/uL (ref 140–400)

## 2011-06-01 ENCOUNTER — Telehealth: Payer: Self-pay | Admitting: Endocrinology

## 2011-06-01 ENCOUNTER — Other Ambulatory Visit (INDEPENDENT_AMBULATORY_CARE_PROVIDER_SITE_OTHER): Payer: Medicare Other

## 2011-06-01 ENCOUNTER — Other Ambulatory Visit: Payer: Self-pay | Admitting: Endocrinology

## 2011-06-01 DIAGNOSIS — E785 Hyperlipidemia, unspecified: Secondary | ICD-10-CM

## 2011-06-01 DIAGNOSIS — R7309 Other abnormal glucose: Secondary | ICD-10-CM

## 2011-06-01 LAB — LIPID PANEL
Cholesterol: 178 mg/dL (ref 0–200)
Total CHOL/HDL Ratio: 4
Triglycerides: 377 mg/dL — ABNORMAL HIGH (ref 0.0–149.0)
VLDL: 75.4 mg/dL — ABNORMAL HIGH (ref 0.0–40.0)

## 2011-06-01 LAB — HEMOGLOBIN A1C: Hgb A1c MFr Bld: 5.4 % (ref 4.6–6.5)

## 2011-06-02 ENCOUNTER — Other Ambulatory Visit: Payer: Self-pay | Admitting: Oncology

## 2011-06-02 ENCOUNTER — Telehealth: Payer: Self-pay | Admitting: Oncology

## 2011-06-02 DIAGNOSIS — C61 Malignant neoplasm of prostate: Secondary | ICD-10-CM

## 2011-06-02 NOTE — Telephone Encounter (Signed)
called pt and informed him of lab appt on 12/11

## 2011-06-02 NOTE — Progress Notes (Signed)
Addended by: Jeanette Caprice C on: 06/02/2011 09:40 AM   Modules accepted: Orders, SmartSet

## 2011-07-06 ENCOUNTER — Other Ambulatory Visit (HOSPITAL_BASED_OUTPATIENT_CLINIC_OR_DEPARTMENT_OTHER): Payer: Medicare Other

## 2011-07-06 DIAGNOSIS — C61 Malignant neoplasm of prostate: Secondary | ICD-10-CM

## 2011-07-06 LAB — COMPREHENSIVE METABOLIC PANEL
BUN: 20 mg/dL (ref 6–23)
CO2: 25 mEq/L (ref 19–32)
Glucose, Bld: 96 mg/dL (ref 70–99)
Sodium: 143 mEq/L (ref 135–145)
Total Bilirubin: 0.7 mg/dL (ref 0.3–1.2)
Total Protein: 6.7 g/dL (ref 6.0–8.3)

## 2011-07-06 LAB — CBC WITH DIFFERENTIAL/PLATELET
Basophils Absolute: 0 10*3/uL (ref 0.0–0.1)
EOS%: 4.1 % (ref 0.0–7.0)
Eosinophils Absolute: 0.2 10*3/uL (ref 0.0–0.5)
HGB: 12.9 g/dL — ABNORMAL LOW (ref 13.0–17.1)
LYMPH%: 24.4 % (ref 14.0–49.0)
MCH: 34.2 pg — ABNORMAL HIGH (ref 27.2–33.4)
MCV: 96 fL (ref 79.3–98.0)
MONO%: 6.4 % (ref 0.0–14.0)
NEUT#: 3.8 10*3/uL (ref 1.5–6.5)
Platelets: 179 10*3/uL (ref 140–400)
RBC: 3.78 10*6/uL — ABNORMAL LOW (ref 4.20–5.82)
RDW: 12.6 % (ref 11.0–14.6)

## 2011-07-07 ENCOUNTER — Telehealth: Payer: Self-pay | Admitting: *Deleted

## 2011-07-07 NOTE — Telephone Encounter (Signed)
left voice message informing the patient of the new time of the appointment but the same day

## 2011-07-08 ENCOUNTER — Other Ambulatory Visit: Payer: Self-pay

## 2011-07-12 ENCOUNTER — Other Ambulatory Visit: Payer: Self-pay | Admitting: Lab

## 2011-07-12 DIAGNOSIS — C61 Malignant neoplasm of prostate: Secondary | ICD-10-CM

## 2011-07-13 LAB — TESTOSTERONE: Testosterone: <10 ng/dL — ABNORMAL LOW (ref 250–890)

## 2011-07-13 LAB — PSA: PSA: 0.04 ng/mL (ref <=4.00)

## 2011-08-09 NOTE — Telephone Encounter (Signed)
x

## 2011-08-10 ENCOUNTER — Other Ambulatory Visit: Payer: Self-pay | Admitting: Oncology

## 2011-08-10 ENCOUNTER — Other Ambulatory Visit (HOSPITAL_BASED_OUTPATIENT_CLINIC_OR_DEPARTMENT_OTHER): Payer: Medicare Other | Admitting: Lab

## 2011-08-10 DIAGNOSIS — C61 Malignant neoplasm of prostate: Secondary | ICD-10-CM | POA: Diagnosis not present

## 2011-08-10 LAB — CBC WITH DIFFERENTIAL/PLATELET
BASO%: 0.3 % (ref 0.0–2.0)
Basophils Absolute: 0 10*3/uL (ref 0.0–0.1)
EOS%: 4.2 % (ref 0.0–7.0)
HGB: 13.3 g/dL (ref 13.0–17.1)
MCH: 34.3 pg — ABNORMAL HIGH (ref 27.2–33.4)
MCHC: 35.8 g/dL (ref 32.0–36.0)
MONO#: 0.3 10*3/uL (ref 0.1–0.9)
RDW: 12.4 % (ref 11.0–14.6)
WBC: 5.9 10*3/uL (ref 4.0–10.3)
lymph#: 1.3 10*3/uL (ref 0.9–3.3)

## 2011-08-10 LAB — COMPREHENSIVE METABOLIC PANEL
ALT: 42 U/L (ref 0–53)
AST: 59 U/L — ABNORMAL HIGH (ref 0–37)
Albumin: 4.4 g/dL (ref 3.5–5.2)
CO2: 23 mEq/L (ref 19–32)
Calcium: 9.8 mg/dL (ref 8.4–10.5)
Chloride: 103 mEq/L (ref 96–112)
Potassium: 4.5 mEq/L (ref 3.5–5.3)

## 2011-08-17 ENCOUNTER — Encounter: Payer: Self-pay | Admitting: Oncology

## 2011-08-17 ENCOUNTER — Ambulatory Visit (HOSPITAL_BASED_OUTPATIENT_CLINIC_OR_DEPARTMENT_OTHER): Payer: Medicare Other | Admitting: Oncology

## 2011-08-17 VITALS — BP 129/67 | HR 64 | Temp 97.1°F | Ht 69.0 in | Wt 169.6 lb

## 2011-08-17 DIAGNOSIS — I2510 Atherosclerotic heart disease of native coronary artery without angina pectoris: Secondary | ICD-10-CM | POA: Diagnosis not present

## 2011-08-17 DIAGNOSIS — C61 Malignant neoplasm of prostate: Secondary | ICD-10-CM

## 2011-08-17 MED ORDER — NITROGLYCERIN 0.4 MG SL SUBL: 0.4000 mg | SUBLINGUAL_TABLET | SUBLINGUAL | Status: DC | PRN

## 2011-08-17 MED ORDER — MECLIZINE HCL 25 MG PO TABS: 25.0000 mg | ORAL_TABLET | Freq: Three times a day (TID) | ORAL | Status: DC | PRN

## 2011-08-17 NOTE — Progress Notes (Signed)
Hematology and Oncology Follow Up Visit  Kurt Elliott [id number] Nov 28, 1920 78 y.o. 08/17/2011    HPI: Kurt Elliott PSA and prostate had been followed for a very long time.  His PSA was approximately 15 in 2007, then about 6 months later it went to 28, which meant that it had just about doubled in 6 months.  Of course, that raised red flags and the patient had his prostate biopsied at [facility] under Dr. Colon Branch 08/02/2006.  This showed a Gleason 9 (4+5) adenocarcinoma in 5 of the 6 cores, the other core showing a Gleason 8 (3+5).  In short, an aggressive tumor.  The patient had a bone scan 08/10/2006, which was negative, and CTs of the abdomen and pelvis 08/15/2006, which showed an enlarged prostate at 5.7 x 5.1 cm, but no evidence of adenopathy or extracapsular spread; no obvious invasion of the rectum or the bladder; and negative bone windows.  There was incidental nodularity around the left perirenal fat of unclear significance.   The patient was started on Casodex for about a month before receiving his first Lupron shot 09/05/2006.  He had a second Lupron shot 09/09/2006.  His PSA  decreased wonderfully.  Kurt Elliott received radiation therapy given to 78 Gy with curative intent, completed October of 2008.  There was eventual development of right hip pain and a rapid PSA doubling time. He resumed Lupron and Casodex in September 2011, the Casodex then stopped after one month. He received Lupron in September of 2011 and December of 2011 with a wonderful response.  In August 2012, there was further increase in the PSA, and he received a dose of Lupron at that time. Now the normalized PSA of 0.04, followed with observation alone.  Interim History:   Kurt Elliott returns today accompanied by his wife, Kurt Elliott, for followup of his prostate cancer. He is feeling very well and has very few complaints. He does mention that he needs refills on his meclizine and nitroglycerin. He rarely utilizes the meclizine,  and in fact tells me he has not used nitroglycerin at all and it has "expired".  Kurt Elliott is occasionally fatigued. He does wear glasses and dentures. He has a little bit of a runny nose associated with the weather. He has known arthritis with associated difficulties walking, but none of this has changed or worsened since his last appointment here. He has no problems with urinating, no change in urine flow, no dysuria or hematuria. No increased pain.  A detailed review of systems is otherwise noncontributory as noted below.  Review of Systems: Constitutional:  Fatigue, no weight loss, fever, night sweats and feels well Eyes: uses glasses WUJ:[id number] discharge Cardiovascular: no chest pain or dyspnea on exertion Respiratory: no cough, shortness of breath, or wheezing Neurological: no TIA or stroke symptoms Dermatological: negative Gastrointestinal: no abdominal pain, change in bowel habits, or black or bloody stools Genito-Urinary: no dysuria, trouble voiding, or hematuria Hematological and Lymphatic: negative Musculoskeletal: positive for - joint pain Remaining ROS negative.  PAST MEDICAL HISTORY:  Significant for coronary artery disease, the patient having had a cardiac catheterization in mid 2008.  He has had stenting of the LAD and RCA remotely and there has been some restenosis of both, in addition to other stenoses in the 20 to 50 percent range involving not only those 2 arteries but the obtuse marginals, circumflex, and second diagonal.  His ejection fraction is well preserved at 70%.  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  He has a significant stenosis of the left main renal artery (40%) and also stenosis of the internal mammary artery.  Neither of these are symptomatic at present.  The patient has hypercholesterolemia, which is well controlled.  He has a 4-mm right upper lobe nodule of uncertain significance noted by CT  angiogram 12/05/2006 (that showed no PE).  He has a history of spinal stenosis.  He is status post herniorrhaphy and does have an incisional hernia; history of hypertension; history of a swallowed dental implant remotely; history of hyperthyroidism;  history of cholecystectomy following gallbladder rupture;  history of ruptured diverticulum requiring partial colon resection; history of bilateral renal cysts.  FAMILY HISTORY:  The patient's father died at the age of 18 from an embolus.  The patient's mother died at the age of 37 in the setting of dementia.  The patient had 1 sister die with breast cancer (neglected), 1 sister died with diabetes, and 1 brother with Parkinson's disease.  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  He has been married nearly 60 years to his wife, Kurt Elliott.  He used to be a Scientific laboratory technician.  They have 4 children; 2 of them are Urologists in [place] (married to twin sisters).  In particular, they would like me to contact Kurt Elliott [phone] is the fax #) with any plans.  One of the sons, the oldest one, is a Rabbi in [place] and then the daughter, Kurt Elliott, is Dr. Marzetta Board wife.    The patient has a total of 12 grandchildren and is waiting on great-grandchildren.   Medications:   I have reviewed the patient's current medications.  Current Outpatient Prescriptions  Medication Sig Dispense Refill  . aspirin 81 MG tablet Take 81 mg by mouth daily.        . Fluticasone-Salmeterol (ADVAIR DISKUS) 100-50 MCG/DOSE AEPB Inhale 1 puff into the lungs every 12 (twelve) hours. As needed       . meclizine (ANTIVERT) 25 MG tablet Take 1 tablet (25 mg total) by mouth every 8 (eight) hours as needed for dizziness.  30 tablet  0  . methimazole (TAPAZOLE) 5 MG tablet TAKE 1 TABLET 3 TIMES A WEEK (ON MONDAY, WEDNESDAY, AND FRIDAY)  40 tablet  1  . metoprolol (LOPRESSOR) 50 MG tablet TAKE 1 TABLET BY MOUTH TWO TIMES A DAY  180 tablet  3  . nitroGLYCERIN (NITROSTAT) 0.4 MG  SL tablet Place 1 tablet (0.4 mg total) under the tongue every 5 (five) minutes as needed.  50 tablet  0  . olmesartan (BENICAR) 5 MG tablet Take 2 tablets (10 mg total) by mouth daily.  180 tablet  4  . pantoprazole (PROTONIX) 40 MG tablet TAKE 1 TABLET BY MOUTH EVERY DAY  90 tablet  3  . promethazine-codeine (PHENERGAN WITH CODEINE) 6.25-10 MG/5ML syrup Take 5 mLs by mouth every 4 (four) hours as needed.        Marland Kitchen ipratropium (ATROVENT) 0.06 % nasal spray       . MICRONIZED COLESTIPOL HCL 1 G tablet         Allergies:  Allergies  Allergen Reactions  . Lisinopril     REACTION: COUGH  . Ticlopidine Hcl     REACTION: rash    Physical Exam: Filed Vitals:   08/17/11 1103  BP: 129/67  Pulse: 64  Temp: 97.1 F (36.2 C)   HEENT:  Sclerae anicteric.  Oropharynx clear.  No mucositis or  candidiasis.   Nodes:  No lymphadenopathy palpated.  Lungs:  Clear to auscultation bilaterally.  No crackles, rhonchi, or wheezes.   Heart:  Regular rate and rhythm.   Abdomen:  Soft, nontender.  Positive bowel sounds.  No organomegaly or masses palpated.   Musculoskeletal:  No focal spinal tenderness to palpation.  Extremities:  Benign.  No peripheral edema or cyanosis.   Skin:  Benign.   Neuro:  Nonfocal.   Lab Results: Lab Results  Component Value Date   WBC 5.9 08/10/2011   HGB 13.3 08/10/2011   HCT 37.0* 08/10/2011   MCV 95.8 08/10/2011   PLT 188 08/10/2011   NEUTROABS 4.0 08/10/2011     Chemistry      Component Value Date/Time   NA 139 08/10/2011 1110   NA 139 08/10/2011 1110   NA 139 08/10/2011 1110   K 4.5 08/10/2011 1110   K 4.5 08/10/2011 1110   K 4.5 08/10/2011 1110   CL 103 08/10/2011 1110   CL 103 08/10/2011 1110   CL 103 08/10/2011 1110   CO2 23 08/10/2011 1110   CO2 23 08/10/2011 1110   CO2 23 08/10/2011 1110   BUN 26* 08/10/2011 1110   BUN 26* 08/10/2011 1110   BUN 26* 08/10/2011 1110   CREATININE 1.14 08/10/2011 1110   CREATININE 1.14 08/10/2011 1110   CREATININE 1.14 08/10/2011 1110        Component Value Date/Time   CALCIUM 9.8 08/10/2011 1110   CALCIUM 9.8 08/10/2011 1110   CALCIUM 9.8 08/10/2011 1110   CALCIUM 10.0 01/31/2007 2246   ALKPHOS 85 08/10/2011 1110   ALKPHOS 85 08/10/2011 1110   ALKPHOS 85 08/10/2011 1110   AST 59* 08/10/2011 1110   AST 59* 08/10/2011 1110   AST 59* 08/10/2011 1110   ALT 42 08/10/2011 1110   ALT 42 08/10/2011 1110   ALT 42 08/10/2011 1110   BILITOT 0.9 08/10/2011 1110   BILITOT 0.9 08/10/2011 1110   BILITOT 0.9 08/10/2011 1110     PSA is down to 0.04. This is compared to 0.05 in October 2012, and 0.23 in June of 2012.  Testosterone was low at 19.32, compared to <10 on 07/06/2011, and 10.96 on 05/25/2011.   Radiological Studies:  No results found.   Assessment:  An 78 year old [place] man  (1)  a history of prostate cancer initially biopsied January 2008, Gleason 4+5, with a rapid doubling time,   (2)  treated initially with Casodex and Lupron with an excellent PSA response, status post radiation to 78 Gy given with curative intent completed October 2008.    (3)  Off Lupron as of December 2008 with the eventual development of right hip pain and a rapid PSA doubling time.  He resumed Lupron and Casodex September 2011 with the Casodex stopped after a month.  He received 2 Lupron doses (the statement above is incorrect that he only received 1 in September), namely September 20 and July 25, 2010.     (4)  slight increase in PSA again in August 2012, and received 1 dose of Lupron at that time.  Plan:  This case was reviewed Dr. Darnelle Catalan was also spoken with the patient today. He is very pleased with the patient's low PSA, and Mr. Dismore will not need a Lupron injection today.  Per Dr. Darnelle Catalan plan, he'll return in 6 weeks for labs only, then in 3 months for labs and followup visit. We will also refilled his nitroglycerin and meclizine today as requested.  This plan was reviewed with the patient, who voices understanding and  agreement.  She knows to call with any changes or problems.    Zollie Scale, PA-C 08/17/2011

## 2011-08-30 ENCOUNTER — Telehealth: Payer: Self-pay

## 2011-08-30 MED ORDER — CIPROFLOXACIN HCL 500 MG PO TABS: 500.0000 mg | ORAL_TABLET | Freq: Two times a day (BID) | ORAL | Status: AC

## 2011-08-30 MED ORDER — ZOLPIDEM TARTRATE 5 MG PO TABS: 5.0000 mg | ORAL_TABLET | Freq: Every evening | ORAL | Status: DC | PRN

## 2011-08-30 MED ORDER — ONDANSETRON HCL 4 MG PO TABS: 4.0000 mg | ORAL_TABLET | Freq: Three times a day (TID) | ORAL | Status: AC | PRN

## 2011-08-30 NOTE — Telephone Encounter (Signed)
i printed 

## 2011-08-30 NOTE — Telephone Encounter (Signed)
Pt's spouse called requesting Rx for a 2 week trip her and her spouse are preparing to take; Rx for traveling diarrhea, cold and xanax for sleep.

## 2011-09-23 ENCOUNTER — Other Ambulatory Visit (HOSPITAL_BASED_OUTPATIENT_CLINIC_OR_DEPARTMENT_OTHER): Payer: Medicare Other | Admitting: Lab

## 2011-09-23 DIAGNOSIS — C61 Malignant neoplasm of prostate: Secondary | ICD-10-CM

## 2011-09-23 DIAGNOSIS — I2510 Atherosclerotic heart disease of native coronary artery without angina pectoris: Secondary | ICD-10-CM

## 2011-09-23 LAB — COMPREHENSIVE METABOLIC PANEL
ALT: 32 U/L (ref 0–53)
AST: 42 U/L — ABNORMAL HIGH (ref 0–37)
Alkaline Phosphatase: 91 U/L (ref 39–117)
Calcium: 9.4 mg/dL (ref 8.4–10.5)
Chloride: 106 mEq/L (ref 96–112)
Creatinine, Ser: 1.14 mg/dL (ref 0.50–1.35)
Potassium: 4.4 mEq/L (ref 3.5–5.3)

## 2011-09-23 LAB — CBC WITH DIFFERENTIAL/PLATELET
BASO%: 0.1 % (ref 0.0–2.0)
EOS%: 4.3 % (ref 0.0–7.0)
MCH: 33.7 pg — ABNORMAL HIGH (ref 27.2–33.4)
MCHC: 35.3 g/dL (ref 32.0–36.0)
MONO%: 6.9 % (ref 0.0–14.0)
NEUT%: 71.2 % (ref 39.0–75.0)
RDW: 12.5 % (ref 11.0–14.6)
lymph#: 1.1 10*3/uL (ref 0.9–3.3)

## 2011-09-27 ENCOUNTER — Other Ambulatory Visit: Payer: Self-pay | Admitting: Physician Assistant

## 2011-09-28 ENCOUNTER — Telehealth: Payer: Self-pay | Admitting: Oncology

## 2011-09-28 ENCOUNTER — Telehealth: Payer: Self-pay | Admitting: *Deleted

## 2011-09-28 NOTE — Telephone Encounter (Signed)
PT. REQUESTED RESULTS TO BE LEFT ON HIS VOICE MAIL. VERBAL ORDER AND READ BACK TO AMY BERRY,PA- MAY LEAVE RESULTS OF PT.'S PSA AND TESTOSTERONE ON HIS VOICE MAIL. ALSO INFORM PT. THAT DR.MAGRINAT WILL RETURN TO THE OFFICE ON Monday AND SHOULD CALL IF HE HAS ANY QUESTIONS. LEFT THE ABOVE INFORMATION ON PT.'S VOICE MAIL AS REQUESTED.

## 2011-09-28 NOTE — Telephone Encounter (Signed)
S/w the pt and he is aware of his April 30th appt time change to 4:30pm with dr Darnelle Catalan

## 2011-10-10 ENCOUNTER — Other Ambulatory Visit: Payer: Self-pay | Admitting: Endocrinology

## 2011-10-13 ENCOUNTER — Other Ambulatory Visit: Payer: Self-pay | Admitting: Oncology

## 2011-11-01 DIAGNOSIS — H1044 Vernal conjunctivitis: Secondary | ICD-10-CM | POA: Diagnosis not present

## 2011-11-15 DIAGNOSIS — L570 Actinic keratosis: Secondary | ICD-10-CM | POA: Diagnosis not present

## 2011-11-16 ENCOUNTER — Other Ambulatory Visit (HOSPITAL_BASED_OUTPATIENT_CLINIC_OR_DEPARTMENT_OTHER): Payer: Medicare Other | Admitting: Lab

## 2011-11-16 DIAGNOSIS — I2510 Atherosclerotic heart disease of native coronary artery without angina pectoris: Secondary | ICD-10-CM

## 2011-11-16 DIAGNOSIS — C61 Malignant neoplasm of prostate: Secondary | ICD-10-CM

## 2011-11-16 LAB — CBC WITH DIFFERENTIAL/PLATELET
Basophils Absolute: 0 10*3/uL (ref 0.0–0.1)
Eosinophils Absolute: 0.2 10*3/uL (ref 0.0–0.5)
HCT: 37.1 % — ABNORMAL LOW (ref 38.4–49.9)
LYMPH%: 23.6 % (ref 14.0–49.0)
MCV: 95.5 fL (ref 79.3–98.0)
MONO%: 6.2 % (ref 0.0–14.0)
NEUT#: 4 10*3/uL (ref 1.5–6.5)
NEUT%: 66.2 % (ref 39.0–75.0)
Platelets: 160 10*3/uL (ref 140–400)
RBC: 3.89 10*6/uL — ABNORMAL LOW (ref 4.20–5.82)
nRBC: 0 % (ref 0–0)

## 2011-11-16 LAB — COMPREHENSIVE METABOLIC PANEL
BUN: 18 mg/dL (ref 6–23)
CO2: 25 mEq/L (ref 19–32)
Calcium: 9.6 mg/dL (ref 8.4–10.5)
Chloride: 105 mEq/L (ref 96–112)
Creatinine, Ser: 0.99 mg/dL (ref 0.50–1.35)
Glucose, Bld: 101 mg/dL — ABNORMAL HIGH (ref 70–99)
Total Bilirubin: 0.9 mg/dL (ref 0.3–1.2)

## 2011-11-16 LAB — PSA: PSA: 0.97 ng/mL (ref <=4.00)

## 2011-11-23 ENCOUNTER — Ambulatory Visit (HOSPITAL_BASED_OUTPATIENT_CLINIC_OR_DEPARTMENT_OTHER): Payer: Medicare Other | Admitting: Oncology

## 2011-11-23 ENCOUNTER — Other Ambulatory Visit: Payer: Self-pay | Admitting: *Deleted

## 2011-11-23 VITALS — BP 192/76 | HR 71 | Temp 97.7°F | Ht 69.0 in | Wt 170.3 lb

## 2011-11-23 DIAGNOSIS — C61 Malignant neoplasm of prostate: Secondary | ICD-10-CM

## 2011-11-23 DIAGNOSIS — M25559 Pain in unspecified hip: Secondary | ICD-10-CM | POA: Diagnosis not present

## 2011-11-23 MED ORDER — TAMSULOSIN HCL 0.4 MG PO CAPS: 0.4000 mg | ORAL_CAPSULE | Freq: Every day | ORAL | Status: DC

## 2011-11-23 MED ORDER — LEUPROLIDE ACETATE (3 MONTH) 22.5 MG IM KIT
22.5000 mg | PACK | Freq: Once | INTRAMUSCULAR | Status: AC
  Administered 2011-11-23: 22.5 mg via INTRAMUSCULAR
  Filled 2011-11-23: qty 22.5

## 2011-11-23 NOTE — Progress Notes (Signed)
ID: Hilbert Bible   DOB: Dec 17, 1920  MR#: [id number]  VHQ#:[id number]  HISTORY OF PRESENT ILLNESS: Mr. Todt tells me his PSA and prostate had been followed for a very long time.  His PSA was approximately 15 about a year ago [2007], then about 6 months ago it went to 67, which means that it had just about doubled in 6 months.  Of course, that raised red flags and the patient had his prostate biopsied at [facility] under Dr. Colon Branch 08/02/2006.  This showed a Gleason 9 (4+5) adenocarcinoma in 5 of the 6 cores, the other core showing a Gleason 8 (3+5).  In short, an aggressive tumor.  The patient had a bone scan 08/10/2006, which was negative, and CTs of the abdomen and pelvis 08/15/2006, which showed an enlarged prostate at 5.7 x 5.1 cm, but no evidence of adenopathy or extracapsular spread; no obvious invasion of the rectum or the bladder; and negative bone windows.  There was incidental nodularity around the left perirenal fat of unclear significance.   The patient was started on Casodex for about a month before receiving his first Lupron shot 09/05/2006.  He had a second Lupron shot 09/09/2006.  His PSA responded well, and his subsequent history is as detailed below.  INTERVAL HISTORY: Area returns today with his wife Windell Moulding for followup of his prostate cancer. The interval history is generally unremarkable. 2 of his grandchildren are going to be marrying in the next few months and they're looking forward to that.  REVIEW OF SYSTEMS: He swims 20 laps twice a week. He does the treadmill 10-12 minutes at a time, but this tires out his hips, he says. He had a squamous cell cancer removed by Dr. 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 He describes himself as fatigued, but not more than 6 months ago. He has no unusual headaches, visual changes, cough, shortness of breath, phlegm production, or change in bowel or bladder habits. Swallowing is normal and food does not "go  the wrong way". He is minimally constipated, which he handles with stool softeners. He has nocturia x2 most nights, sometimes improved with Tylenol PM. He thinks his balance may not be as good as it was, but there have been no falls. A detailed review of systems was otherwise stable  PAST MEDICAL HISTORY: Past Medical History  Diagnosis Date  . CAD (coronary artery disease)     PCI in the past / last catheterization May, 2008, medical therapy  . Hypertension   . Hyperlipemia   . Unspecified disorder of liver   . Cellulitis, leg     left  . Pulmonary nodule, right     2008 / unchanged CT scan, July, 2009  . Abdominal pain, left lower quadrant   . Adenocarcinoma of prostate   . Esophageal stricture   . Spinal stenosis   . Hyperglycemia   . Hyperthyroidism   . GERD (gastroesophageal reflux disease)   . Hx of colonic polyps   . Renal artery stenosis     Mild  . Atheroma     no penetrating ulcer, Abdominal aorta  . Inferior mesenteric artery injury     High-grade stenosis, CT scan, 2006  . Vertigo   . Diverticulum   . Incisional hernia   . Rash     Ticlid  . Cough     ACE Inhibitor, tolerates ARB  . Carotid artery disease     Doppler, 2006,  minimal disease  . Renal cyst     Hyperdense cyst lower pole left kidney  Significant for coronary artery disease, the patient having had a cardiac catheterization most recently within the last 2 months.  He has had stenting of the LAD and RCA remotely and there has been some restenosis of both, in addition to other stenoses in the 20 to 50 percent range involving not only those 2 arteries but the obtuse marginals, circumflex, and second diagonal.  His ejection fraction is well preserved at 70%.  The patient also has a history of mural thrombus/atheroma in the abdominal aorta noted December of 2006.  He has a significant stenosis of the left main renal artery (40%) and also stenosis of the internal mammary artery.  Neither of these are  symptomatic at present.  The patient has hypercholesterolemia, which is well controlled.  He has a 4-mm right upper lobe nodule of uncertain significance noted by CT angiogram 12/05/2006 (that showed no PE).  He has a history of spinal stenosis.  He is status post herniorrhaphy and does have an incisional hernia; history of hypertension; history of a swallowed dental implant remotely; history of hyperthyroidism;  history of cholecystectomy following gallbladder rupture;  history of ruptured diverticulum requiring partial colon resection; history of bilateral renal cysts.  PAST SURGICAL HISTORY: Past Surgical History  Procedure Date  . Esophagogastroduodenoscopy   . Cardiac catheterization   . Incisional hernia repair     FAMILY HISTORY Family History  Problem Relation Age of Onset  . Breast cancer Sister     2 sisters  . Pemphigus vulgaris Mother     died at 19  The patient's father died at the age of 34 from an embolus.  The patient's mother died at the age of 43 in the setting of dementia.  The patient had 1 sister die with breast cancer (neglected), 1 sister died with diabetes, and 1 brother with Parkinson's disease.  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  He has been married 60+ years to his wife, Windell Moulding.  She used to be a Scientific laboratory technician.  They have 4 children; 2 of them are Urologists in [place] (married to twin sisters).  In particular, they would like me to contact Arlys John [phone] is the fax #) with any plans.  One of the sons, the oldest one, is a Rabbi in [place] and then the daughter, Huntley Dec, is our Dr. Macon Large wife. The patient has a total of 12 grandchildren and is waiting on great-grandchildren.   ADVANCED DIRECTIVES: in place  HEALTH MAINTENANCE: History  Substance Use Topics  . Smoking status: Never Smoker   . Smokeless tobacco: Not on file  . Alcohol Use: No     Colonoscopy: 2005  Bone density:  Lipid panel:  Allergies  Allergen  Reactions  . Lisinopril     REACTION: COUGH  . Ticlopidine Hcl     REACTION: rash    Current Outpatient Prescriptions  Medication Sig Dispense Refill  . aspirin 81 MG tablet Take 81 mg by mouth daily.        . Fluticasone-Salmeterol (ADVAIR DISKUS) 100-50 MCG/DOSE AEPB Inhale 1 puff into the lungs every 12 (twelve) hours. As needed       . ipratropium (ATROVENT) 0.06 % nasal spray       . meclizine (ANTIVERT) 25 MG tablet Take 1 tablet (25 mg total) by mouth every 8 (eight) hours as needed for dizziness.  30 tablet  0  .  methimazole (TAPAZOLE) 5 MG tablet TAKE 1 TABLET 3 TIMES A WEEK (ON MONDAY, WEDNESDAY, AND FRIDAY)  40 tablet  0  . metoprolol (LOPRESSOR) 50 MG tablet TAKE 1 TABLET BY MOUTH TWO TIMES A DAY  180 tablet  3  . MICRONIZED COLESTIPOL HCL 1 G tablet       . nitroGLYCERIN (NITROSTAT) 0.4 MG SL tablet Place 1 tablet (0.4 mg total) under the tongue every 5 (five) minutes as needed.  50 tablet  0  . olmesartan (BENICAR) 5 MG tablet Take 2 tablets (10 mg total) by mouth daily.  180 tablet  4  . pantoprazole (PROTONIX) 40 MG tablet TAKE 1 TABLET BY MOUTH EVERY DAY  90 tablet  3  . promethazine-codeine (PHENERGAN WITH CODEINE) 6.25-10 MG/5ML syrup Take 5 mLs by mouth every 4 (four) hours as needed.        . zolpidem (AMBIEN) 5 MG tablet Take 1 tablet (5 mg total) by mouth at bedtime as needed for sleep.  10 tablet  0  . Tamsulosin HCl (FLOMAX) 0.4 MG CAPS Take 1 capsule (0.4 mg total) by mouth daily after supper.  30 capsule  12   No current facility-administered medications for this visit.   Facility-Administered Medications Ordered in Other Visits  Medication Dose Route Frequency Provider Last Rate Last Dose  . leuprolide (LUPRON) injection 22.5 mg  22.5 mg Intramuscular Once Lowella Dell, MD   22.5 mg at 11/23/11 1705    OBJECTIVE: Elderly white male who appears comfortable Filed Vitals:   11/23/11 1629  BP: 192/76  Pulse: 71  Temp: 97.7 F (36.5 C)     Body mass  index is 25.15 kg/(m^2).    ECOG FS: 2  Sclerae unicteric Oropharynx clear No peripheral adenopathy Lungs no rales or rhonchi Heart regular rate and rhythm Abd benign MSK no focal spinal tenderness, no peripheral edema Neuro: nonfocal  LAB RESULTS: Results for TYSEAN, VANDERVLIET (MRN [id number]) as of 11/23/2011 16:45  Ref. Range 01/08/2011 09:54 01/08/2011 09:54 05/25/2011 10:01 07/06/2011 17:03 08/10/2011 11:10  09/23/2011 13:20 11/16/2011 13:04  Testosterone Latest Range: 300-890 ng/dL 409.81 (L) 191.47 (L) 82.95 (L) <10.00 (L) 19.32 (L)  138.81 (L) 242.40 (L)   Results for WELBORN, KEENA (MRN [id number]) as of 11/23/2011 16:45  Ref. Range 01/08/2011 09:54 05/25/2011 10:01 05/25/2011 10:01 07/06/2011 17:03 08/10/2011 11:10 09/23/2011 13:20 11/16/2011 13:04  PSA Latest Range: <=4.00 ng/mL 0.23 0.05 0.05 0.04 0.04 0.26 0.97    Lab Results  Component Value Date   WBC 6.1 11/16/2011   NEUTROABS 4.0 11/16/2011   HGB 13.2 11/16/2011   HCT 37.1* 11/16/2011   MCV 95.5 11/16/2011   PLT 160 11/16/2011      Chemistry      Component Value Date/Time   NA 139 11/16/2011 1304   K 4.2 11/16/2011 1304   CL 105 11/16/2011 1304   CO2 25 11/16/2011 1304   BUN 18 11/16/2011 1304   CREATININE 0.99 11/16/2011 1304      Component Value Date/Time   CALCIUM 9.6 11/16/2011 1304   CALCIUM 10.0 01/31/2007 2246   ALKPHOS 89 11/16/2011 1304   AST 44* 11/16/2011 1304   ALT 30 11/16/2011 1304   BILITOT 0.9 11/16/2011 1304       No results found for this basename: LABCA2    No components found with this basename: QIONG295    No results found for this basename: INR:1;PROTIME:1 in the last 168 hours  Urinalysis    Component Value Date/Time  COLORURINE YELLOW 04/20/2010 1540   APPEARANCEUR CLEAR 04/20/2010 1540   LABSPEC >=1.030 04/20/2010 1540   PHURINE 5.5 04/20/2010 1540   BILIRUBINUR NEGATIVE 04/20/2010 1540   KETONESUR TRACE 04/20/2010 1540   UROBILINOGEN 0.2 04/20/2010 1540   NITRITE NEGATIVE 04/20/2010 1540    LEUKOCYTESUR NEGATIVE 04/20/2010 1540    STUDIES: No new results found.  ASSESSMENT: 78 year old [place] man with a history of prostate cancer initially biopsied January 2008, Gleason 4+5, with a rapid doubling time, treated initially with Casodex and Lupron with an excellent PSA response, status post radiation to 78 Gy given with curative intent completed October 2008.  Off Lupron as of December 2008. With the eventual development of right hip pain and a rapid PSA doubling time, he resumed Lupron and Casodex September 2011 with the Casodex stopped after a month.  He received  Lupron September 20 and July 25, 2010 with a very good response, then March 02, 2011, again with a significant drop in his PSA.    PLAN: Given the rapid rise in the PSA and testosterone level noted above we went ahead an repeated his treatment today. We will check his labs in 6 and 11 weeks with return visit in 3 months. We are going to try flomax at bedtime to see if this improves his nocturia--his blood pressure is if anything on the high side currently. I encouraged him to start vit D daily and to get on the treadmill daily. Overall, though, he is doing terrific for his age and I am pleased with his cancer's continuing hormonal response. He knows to call for any problems that may develop before the next visit  Kari Montero C    11/23/2011

## 2011-11-24 ENCOUNTER — Telehealth: Payer: Self-pay | Admitting: Oncology

## 2011-11-24 NOTE — Telephone Encounter (Signed)
S/w the pt and he is aware of the lab appt in June. Pt requested [place] to mail him the 2013 appts to him.mailed the June and July 2013 appts to the pt

## 2011-12-02 ENCOUNTER — Ambulatory Visit: Payer: Medicare Other | Admitting: Cardiology

## 2011-12-14 ENCOUNTER — Encounter: Payer: Self-pay | Admitting: Cardiology

## 2011-12-14 DIAGNOSIS — I739 Peripheral vascular disease, unspecified: Secondary | ICD-10-CM

## 2011-12-15 ENCOUNTER — Encounter: Payer: Self-pay | Admitting: Cardiology

## 2011-12-15 ENCOUNTER — Ambulatory Visit (INDEPENDENT_AMBULATORY_CARE_PROVIDER_SITE_OTHER): Payer: Medicare Other | Admitting: Cardiology

## 2011-12-15 VITALS — BP 112/58 | HR 72 | Ht 70.0 in | Wt 167.0 lb

## 2011-12-15 DIAGNOSIS — I779 Disorder of arteries and arterioles, unspecified: Secondary | ICD-10-CM

## 2011-12-15 DIAGNOSIS — I10 Essential (primary) hypertension: Secondary | ICD-10-CM | POA: Diagnosis not present

## 2011-12-15 DIAGNOSIS — I2510 Atherosclerotic heart disease of native coronary artery without angina pectoris: Secondary | ICD-10-CM

## 2011-12-15 DIAGNOSIS — E785 Hyperlipidemia, unspecified: Secondary | ICD-10-CM

## 2011-12-15 NOTE — Progress Notes (Signed)
HPI Patient is seen today for cardiology followup. He has known coronary disease. He's actually done quite well. He's had several interventions over the years. More recently he's had no significant symptoms. He's not having any chest pain or shortness of breath.  Allergies  Allergen Reactions  . Lisinopril     REACTION: COUGH  . Ticlopidine Hcl     REACTION: rash    Current Outpatient Prescriptions  Medication Sig Dispense Refill  . aspirin 81 MG tablet Take 81 mg by mouth daily.        . Fluticasone-Salmeterol (ADVAIR DISKUS) 100-50 MCG/DOSE AEPB Inhale 1 puff into the lungs every 12 (twelve) hours. As needed       . ipratropium (ATROVENT) 0.06 % nasal spray       . leuprolide (LUPRON) 3.75 MG injection Given by Dr Leodis Liverpool -- not sure of dosage      . meclizine (ANTIVERT) 25 MG tablet Take 1 tablet (25 mg total) by mouth every 8 (eight) hours as needed for dizziness.  30 tablet  0  . methimazole (TAPAZOLE) 5 MG tablet TAKE 1 TABLET 3 TIMES A WEEK (ON MONDAY, WEDNESDAY, AND FRIDAY)  40 tablet  0  . metoprolol (LOPRESSOR) 50 MG tablet TAKE 1 TABLET BY MOUTH TWO TIMES A DAY  180 tablet  3  . MICRONIZED COLESTIPOL HCL 1 G tablet Take 5 g by mouth daily.       . nitroGLYCERIN (NITROSTAT) 0.4 MG SL tablet Place 1 tablet (0.4 mg total) under the tongue every 5 (five) minutes as needed.  50 tablet  0  . olmesartan (BENICAR) 5 MG tablet Take 2 tablets (10 mg total) by mouth daily.  180 tablet  4  . pantoprazole (PROTONIX) 40 MG tablet TAKE 1 TABLET BY MOUTH EVERY DAY  90 tablet  3    History   Social History  . Marital Status: Married    Spouse Name: N/A    Number of Children: 4  . Years of Education: N/A   Occupational History  . 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 Smoking status: Never Smoker   . Smokeless tobacco: Not on file  . Alcohol Use: No  . Drug Use: No  . Sexually Active: Not on file   Other Topics Concern  . Not on file   Social History  Narrative  . No narrative on file    Family History  Problem Relation Age of Onset  . Breast cancer Sister     2 sisters  . Pemphigus vulgaris Mother     died at 69    Past Medical History  Diagnosis Date  . CAD (coronary artery disease)     PCI in the past / last catheterization May, 2008, medical therapy  . Hypertension   . Hyperlipemia   . Unspecified disorder of liver   . Cellulitis, leg     left  . Pulmonary nodule, right     2008 / unchanged CT scan, July, 2009  . Abdominal pain, chronic, left lower quadrant   . Adenocarcinoma of prostate   . Esophageal stricture   . Spinal stenosis   . Hyperglycemia   . Hyperthyroidism   . GERD (gastroesophageal reflux disease)   . Hx of colonic polyps   . Renal artery stenosis     Mild  . Atheroma     no penetrating ulcer, Abdominal aorta  . Inferior mesenteric artery injury     High-grade stenosis, CT scan,  2006  . Vertigo   . Diverticulum   . Incisional hernia   . Rash     Ticlid  . Cough     ACE Inhibitor, tolerates ARB  . Carotid artery disease     Doppler, 2006, minimal disease  //  Doppler, May, 2012, mild increase velocities, 40-59% bilateral stenoses  . Renal cyst     Hyperdense cyst lower pole left kidney    Past Surgical History  Procedure Date  . Esophagogastroduodenoscopy   . Cardiac catheterization   . Incisional hernia repair     ROS Patient denies fever, chills, headache, sweats, rash, change in vision, change in hearing, chest pain, cough, nausea vomiting, urinary symptoms. All other systems are reviewed and are negative.  PHYSICAL EXAM Patient is here with his wife. He is oriented to person time and place. Affect is normal. Lungs are clear. Respiratory effort is nonlabored. Cardiac exam was S1 and S2. There no clicks or significant murmurs. The abdomen is soft. There is no peripheral edema.  Filed Vitals:   12/15/11 1522  BP: 112/58  Pulse: 72  Height: 5\' 10"  (1.778 m)  Weight: 167 lb (75.751  kg)   EKG is done today and reviewed by me. EKG reveals no significant abnormalities. There is no significant change.  ASSESSMENT & PLAN

## 2011-12-15 NOTE — Assessment & Plan Note (Signed)
Coronary disease is stable. No change in therapy. I feel we do not need to proceed with any type of stress testing.

## 2011-12-15 NOTE — Patient Instructions (Signed)
Your physician recommends that you schedule a follow-up appointment in: 12 months, the office will mail you a reminder letter 2 months prior the appointment date. Your physician recommends that you continue on your current medications as directed. Please refer to the Current Medication list given to you today.

## 2011-12-15 NOTE — Assessment & Plan Note (Signed)
Blood pressure is controlled. No change in therapy. 

## 2011-12-15 NOTE — Assessment & Plan Note (Signed)
The patient's lipids are managed very carefully by Dr. Everardo All.

## 2011-12-15 NOTE — Assessment & Plan Note (Signed)
Patient's carotids are being watched. He is due for followup yearly Doppler soon. See him back in one year.

## 2011-12-16 ENCOUNTER — Other Ambulatory Visit: Payer: Self-pay | Admitting: Dermatology

## 2011-12-16 DIAGNOSIS — D485 Neoplasm of uncertain behavior of skin: Secondary | ICD-10-CM | POA: Diagnosis not present

## 2011-12-16 DIAGNOSIS — L570 Actinic keratosis: Secondary | ICD-10-CM | POA: Diagnosis not present

## 2011-12-16 DIAGNOSIS — L259 Unspecified contact dermatitis, unspecified cause: Secondary | ICD-10-CM | POA: Diagnosis not present

## 2011-12-29 ENCOUNTER — Other Ambulatory Visit: Payer: Self-pay | Admitting: *Deleted

## 2011-12-29 DIAGNOSIS — I6529 Occlusion and stenosis of unspecified carotid artery: Secondary | ICD-10-CM

## 2011-12-30 ENCOUNTER — Encounter (INDEPENDENT_AMBULATORY_CARE_PROVIDER_SITE_OTHER): Payer: Medicare Other

## 2011-12-30 DIAGNOSIS — I6529 Occlusion and stenosis of unspecified carotid artery: Secondary | ICD-10-CM | POA: Diagnosis not present

## 2011-12-31 ENCOUNTER — Other Ambulatory Visit: Payer: Self-pay | Admitting: Cardiology

## 2012-01-04 ENCOUNTER — Other Ambulatory Visit (HOSPITAL_BASED_OUTPATIENT_CLINIC_OR_DEPARTMENT_OTHER): Payer: Medicare Other | Admitting: Lab

## 2012-01-04 DIAGNOSIS — C61 Malignant neoplasm of prostate: Secondary | ICD-10-CM

## 2012-01-04 LAB — PSA: PSA: 0.14 ng/mL (ref <=4.00)

## 2012-01-04 LAB — COMPREHENSIVE METABOLIC PANEL
ALT: 40 U/L (ref 0–53)
AST: 46 U/L — ABNORMAL HIGH (ref 0–37)
Albumin: 4.2 g/dL (ref 3.5–5.2)
Alkaline Phosphatase: 97 U/L (ref 39–117)
BUN: 27 mg/dL — ABNORMAL HIGH (ref 6–23)
CO2: 23 mEq/L (ref 19–32)
Calcium: 9.6 mg/dL (ref 8.4–10.5)
Chloride: 104 mEq/L (ref 96–112)
Creatinine, Ser: 1.27 mg/dL (ref 0.50–1.35)
Glucose, Bld: 137 mg/dL — ABNORMAL HIGH (ref 70–99)
Potassium: 4.7 mEq/L (ref 3.5–5.3)
Sodium: 137 mEq/L (ref 135–145)
Total Bilirubin: 0.6 mg/dL (ref 0.3–1.2)
Total Protein: 6.9 g/dL (ref 6.0–8.3)

## 2012-01-04 LAB — CBC WITH DIFFERENTIAL/PLATELET
Eosinophils Absolute: 0.2 10*3/uL (ref 0.0–0.5)
HCT: 39 % (ref 38.4–49.9)
LYMPH%: 21.3 % (ref 14.0–49.0)
MCV: 94.7 fL (ref 79.3–98.0)
MONO#: 0.2 10*3/uL (ref 0.1–0.9)
MONO%: 4.4 % (ref 0.0–14.0)
NEUT#: 3.9 10*3/uL (ref 1.5–6.5)
NEUT%: 70 % (ref 39.0–75.0)
Platelets: 191 10*3/uL (ref 140–400)
RBC: 4.11 10*6/uL — ABNORMAL LOW (ref 4.20–5.82)

## 2012-01-04 LAB — TESTOSTERONE: Testosterone: 28.67 ng/dL — ABNORMAL LOW (ref 300–890)

## 2012-01-05 ENCOUNTER — Encounter: Payer: Self-pay | Admitting: *Deleted

## 2012-01-05 NOTE — Progress Notes (Signed)
PER MD, HAVE NOTIFIED PT OF RECENT LAB RESULTS

## 2012-01-07 ENCOUNTER — Encounter: Payer: Self-pay | Admitting: Cardiology

## 2012-01-28 ENCOUNTER — Other Ambulatory Visit: Payer: Self-pay | Admitting: Endocrinology

## 2012-01-31 ENCOUNTER — Encounter: Payer: Self-pay | Admitting: Endocrinology

## 2012-01-31 ENCOUNTER — Ambulatory Visit (INDEPENDENT_AMBULATORY_CARE_PROVIDER_SITE_OTHER): Payer: Medicare Other | Admitting: Endocrinology

## 2012-01-31 VITALS — BP 128/68 | HR 74 | Temp 97.0°F | Wt 165.0 lb

## 2012-01-31 DIAGNOSIS — R05 Cough: Secondary | ICD-10-CM | POA: Diagnosis not present

## 2012-01-31 MED ORDER — FLUTICASONE-SALMETEROL 100-50 MCG/DOSE IN AEPB: 1.0000 | INHALATION_SPRAY | Freq: Two times a day (BID) | RESPIRATORY_TRACT | Status: DC

## 2012-01-31 NOTE — Patient Instructions (Addendum)
I hope you feel better soon.  If you don't feel better by next week, please call back.  Please call sooner if you get worse. Please come in soon for a "medicare wellness" visit.

## 2012-01-31 NOTE — Progress Notes (Signed)
Subjective:    Patient ID: Kurt Elliott, male    DOB: [birth year], 78 y.o.   MRN: [id number]  HPI Pt states 10 days of moderate prod-quality cough in the chest, but no assoc sob.  He feels as though he is getting better Past Medical History  Diagnosis Date  . CAD (coronary artery disease)     PCI in the past / last catheterization May, 2008, medical therapy  . Hypertension   . Hyperlipemia   . Unspecified disorder of liver   . Cellulitis, leg     left  . Pulmonary nodule, right     2008 / unchanged CT scan, July, 2009  . Abdominal pain, left lower quadrant   . Adenocarcinoma of prostate   . Esophageal stricture   . Spinal stenosis   . Hyperglycemia   . Hyperthyroidism   . GERD (gastroesophageal reflux disease)   . Hx of colonic polyps   . Renal artery stenosis     Mild  . Atheroma     no penetrating ulcer, Abdominal aorta  . Inferior mesenteric artery injury     High-grade stenosis, CT scan, 2006  . Vertigo   . Diverticulum   . Incisional hernia   . Rash     Ticlid  . Cough     ACE Inhibitor, tolerates ARB  . Carotid artery disease     Doppler, 2006, minimal disease  //  Doppler, May, 2012, mild increase velocities, 40-59% bilateral stenoses  . Renal cyst     Hyperdense cyst lower pole left kidney    Past Surgical History  Procedure Date  . Esophagogastroduodenoscopy   . Cardiac catheterization   . Incisional hernia repair     History   Social History  . Marital Status: Married    Spouse Name: N/A    Number of Children: 4  . Years of Education: N/A   Occupational History  . 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 Smoking status: Never Smoker   . Smokeless tobacco: Not on file  . Alcohol Use: No  . Drug Use: No  . Sexually Active: Not on file   Other Topics Concern  . Not on file   Social History Narrative  . No narrative on file    Current Outpatient Prescriptions on File Prior to Visit  Medication Sig Dispense Refill    . aspirin 81 MG tablet Take 81 mg by mouth daily.        . Fluticasone-Salmeterol (ADVAIR DISKUS) 100-50 MCG/DOSE AEPB Inhale 1 puff into the lungs every 12 (twelve) hours. As needed  60 each  11  . ipratropium (ATROVENT) 0.06 % nasal spray       . leuprolide (LUPRON) 3.75 MG injection Given by Dr Leodis Liverpool -- not sure of dosage      . methimazole (TAPAZOLE) 5 MG tablet TAKE 1 TABLET 3 TIMES A WEEK (ON MONDAY, WEDNESDAY, AND FRIDAY)  40 tablet  0  . metoprolol (LOPRESSOR) 50 MG tablet TAKE 1 TABLET BY MOUTH TWO TIMES A DAY  180 tablet  3  . MICRONIZED COLESTIPOL HCL 1 G tablet Take 5 g by mouth daily.       . nitroGLYCERIN (NITROSTAT) 0.4 MG SL tablet Place 1 tablet (0.4 mg total) under the tongue every 5 (five) minutes as needed.  50 tablet  0  . olmesartan (BENICAR) 5 MG tablet Take 2 tablets (10 mg total) by mouth daily.  180 tablet  4  . pantoprazole (  PROTONIX) 40 MG tablet TAKE 1 TABLET BY MOUTH EVERY DAY  90 tablet  3    Allergies  Allergen Reactions  . Lisinopril     REACTION: COUGH  . Ticlopidine Hcl     REACTION: rash    Family History  Problem Relation Age of Onset  . Breast cancer Sister     2 sisters  . Pemphigus vulgaris Mother     died at 80   BP 128/68  Pulse 74  Temp 97 F (36.1 C) (Oral)  Wt 165 lb (74.844 kg)  SpO2 94%  Review of Systems Wheezing is resolved.  Denies fever    Objective:   Physical Exam VITAL SIGNS:  See vs page GENERAL: no distress LUNGS:  Clear to auscultation     Assessment & Plan:  URI.  improved

## 2012-02-01 ENCOUNTER — Other Ambulatory Visit: Payer: Self-pay | Admitting: Gastroenterology

## 2012-02-01 DIAGNOSIS — R05 Cough: Secondary | ICD-10-CM

## 2012-02-02 ENCOUNTER — Other Ambulatory Visit: Payer: Self-pay | Admitting: Physician Assistant

## 2012-02-02 DIAGNOSIS — C61 Malignant neoplasm of prostate: Secondary | ICD-10-CM

## 2012-02-07 ENCOUNTER — Other Ambulatory Visit (HOSPITAL_BASED_OUTPATIENT_CLINIC_OR_DEPARTMENT_OTHER): Payer: Medicare Other | Admitting: Lab

## 2012-02-07 DIAGNOSIS — C61 Malignant neoplasm of prostate: Secondary | ICD-10-CM

## 2012-02-07 LAB — CBC WITH DIFFERENTIAL/PLATELET
BASO%: 0.3 % (ref 0.0–2.0)
LYMPH%: 23.5 % (ref 14.0–49.0)
MCHC: 35.5 g/dL (ref 32.0–36.0)
MCV: 94.4 fL (ref 79.3–98.0)
MONO%: 6.5 % (ref 0.0–14.0)
NEUT#: 4.3 10*3/uL (ref 1.5–6.5)
Platelets: 202 10*3/uL (ref 140–400)
RBC: 3.91 10*6/uL — ABNORMAL LOW (ref 4.20–5.82)
RDW: 12.8 % (ref 11.0–14.6)
WBC: 6.5 10*3/uL (ref 4.0–10.3)

## 2012-02-08 LAB — COMPREHENSIVE METABOLIC PANEL
ALT: 44 U/L (ref 0–53)
AST: 50 U/L — ABNORMAL HIGH (ref 0–37)
Albumin: 4.3 g/dL (ref 3.5–5.2)
Alkaline Phosphatase: 89 U/L (ref 39–117)
Potassium: 4.4 mEq/L (ref 3.5–5.3)
Sodium: 139 mEq/L (ref 135–145)
Total Bilirubin: 0.9 mg/dL (ref 0.3–1.2)
Total Protein: 6.9 g/dL (ref 6.0–8.3)

## 2012-02-15 ENCOUNTER — Other Ambulatory Visit: Payer: Medicare Other | Admitting: Lab

## 2012-02-22 ENCOUNTER — Ambulatory Visit (HOSPITAL_BASED_OUTPATIENT_CLINIC_OR_DEPARTMENT_OTHER): Payer: Medicare Other | Admitting: Oncology

## 2012-02-22 VITALS — BP 126/62 | HR 64 | Temp 97.8°F | Ht 70.0 in | Wt 166.2 lb

## 2012-02-22 DIAGNOSIS — M25559 Pain in unspecified hip: Secondary | ICD-10-CM | POA: Diagnosis not present

## 2012-02-22 DIAGNOSIS — C61 Malignant neoplasm of prostate: Secondary | ICD-10-CM

## 2012-02-22 NOTE — Progress Notes (Signed)
ID: Kurt Elliott   DOB: 1920/10/31  MR#: [id number]  WUJ#:[id number]  HISTORY OF PRESENT ILLNESS: Kurt Elliott tells me his PSA and prostate had been followed for a very long time.  His PSA was approximately 15 about a year ago [2007], then about 6 months ago it went to 38, which means that it had just about doubled in 6 months.  Of course, that raised red flags and the patient had his prostate biopsied at [facility] under Dr. Colon Branch 08/02/2006.  This showed a Gleason 9 (4+5) adenocarcinoma in 5 of the 6 cores, the other core showing a Gleason 8 (3+5).  In short, an aggressive tumor.  The patient had a bone scan 08/10/2006, which was negative, and CTs of the abdomen and pelvis 08/15/2006, which showed an enlarged prostate at 5.7 x 5.1 cm, but no evidence of adenopathy or extracapsular spread; no obvious invasion of the rectum or the bladder; and negative bone windows.  There was incidental nodularity around the left perirenal fat of unclear significance.   The patient was started on Casodex for about a month before receiving his first Lupron shot 09/05/2006.  He had a second Lupron shot 09/09/2006.  His PSA responded well, and his subsequent history is as detailed below.  INTERVAL HISTORY: Kurt Elliott returns today with his wife Kurt Elliott for followup of his prostate cancer. The interval history is generally unremarkable. They just returned from a barge trip from [place] to [place], and then a trip to the [place].   REVIEW OF SYSTEMS: He is doing "fine". They did quite a bit of walking during their [place] trip. He still swimming 14-18 elapsed 3 times a week or so. He describes himself as tired, but no more than prior. He is looking forward to his 89th birthday next year. A detailed review of systems was stable and in particular he denies pain, hot flashes, or other symptoms relating to his prostate cancer and its treatment. He still has nocturia x2 and does not feel the Flomax was helpful  PAST MEDICAL HISTORY: Past  Medical History  Diagnosis Date  . CAD (coronary artery disease)     PCI in the past / last catheterization May, 2008, medical therapy  . Hypertension   . Hyperlipemia   . Unspecified disorder of liver   . Cellulitis, leg     left  . Pulmonary nodule, right     2008 / unchanged CT scan, July, 2009  . Abdominal pain, left lower quadrant   . Adenocarcinoma of prostate   . Esophageal stricture   . Spinal stenosis   . Hyperglycemia   . Hyperthyroidism   . GERD (gastroesophageal reflux disease)   . Hx of colonic polyps   . Renal artery stenosis     Mild  . Atheroma     no penetrating ulcer, Abdominal aorta  . Inferior mesenteric artery injury     High-grade stenosis, CT scan, 2006  . Vertigo   . Diverticulum   . Incisional hernia   . Rash     Ticlid  . Cough     ACE Inhibitor, tolerates ARB  . Carotid artery disease     Doppler, 2006, minimal disease  //  Doppler, May, 2012, mild increase velocities, 40-59% bilateral stenoses  . Renal cyst     Hyperdense cyst lower pole left kidney   PAST SURGICAL HISTORY: Past Surgical History  Procedure Date  . Esophagogastroduodenoscopy   . Cardiac catheterization   . Incisional hernia repair  FAMILY HISTORY Family History  Problem Relation Age of Onset  . Breast cancer Sister     2 sisters  . Pemphigus vulgaris Mother     died at 59  The patient's father died at the age of 76 from an embolus.  The patient's mother died at the age of 29 in the setting of dementia.  The patient had 1 sister die with breast cancer (neglected), 1 sister died with diabetes, and 1 brother with Parkinson's disease.  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  He has been married 60+ years to Kurt Elliott, who used to be a Scientific laboratory technician.  They have 4 children; 2 of them are Urologists in [place] (married to twin sisters).  In particular, they would like me to contact Kurt Elliott [phone] is the fax #) with any plans.  One of the sons, the  oldest one, is a Rabbi in [place] and then the daughter, Kurt Elliott, is our Kurt Elliott wife. The patient has a total of 12 grandchildren and is waiting on great-grandchildren.   ADVANCED DIRECTIVES: in place  HEALTH MAINTENANCE: History  Substance Use Topics  . Smoking status: Never Smoker   . Smokeless tobacco: Not on file  . Alcohol Use: No     Colonoscopy: 2005  Bone density:  Lipid panel:  Allergies  Allergen Reactions  . Lisinopril     REACTION: COUGH  . Ticlopidine Hcl     REACTION: rash    Current Outpatient Prescriptions  Medication Sig Dispense Refill  . aspirin 81 MG tablet Take 81 mg by mouth daily.        . Fluticasone-Salmeterol (ADVAIR DISKUS) 100-50 MCG/DOSE AEPB Inhale 1 puff into the lungs every 12 (twelve) hours. As needed  60 each  11  . ipratropium (ATROVENT) 0.06 % nasal spray       . leuprolide (LUPRON) 3.75 MG injection Given by Dr Leodis Liverpool -- not sure of dosage      . meclizine (ANTIVERT) 25 MG tablet TAKE 1 TABLET (25 MG TOTAL) BY MOUTH EVERY 8 (EIGHT) HOURS AS NEEDED FOR DIZZINESS.  30 tablet  1  . methimazole (TAPAZOLE) 5 MG tablet TAKE 1 TABLET 3 TIMES A WEEK (ON MONDAY, WEDNESDAY, AND FRIDAY)  40 tablet  0  . metoprolol (LOPRESSOR) 50 MG tablet TAKE 1 TABLET BY MOUTH TWO TIMES A DAY  180 tablet  3  . MICRONIZED COLESTIPOL HCL 1 G tablet Take 5 g by mouth daily.       Marland Kitchen olmesartan (BENICAR) 5 MG tablet Take 2 tablets (10 mg total) by mouth daily.  180 tablet  4  . pantoprazole (PROTONIX) 40 MG tablet TAKE 1 TABLET BY MOUTH EVERY DAY  90 tablet  3  . nitroGLYCERIN (NITROSTAT) 0.4 MG SL tablet Place 1 tablet (0.4 mg total) under the tongue every 5 (five) minutes as needed.  50 tablet  0    OBJECTIVE: Elderly white male in no acute distress Filed Vitals:   02/22/12 1012  BP: 126/62  Pulse: 64  Temp: 97.8 F (36.6 C)     Body mass index is 23.85 kg/(m^2).    ECOG FS: 2  Sclerae unicteric Oropharynx clear No peripheral adenopathy Lungs no  rales or rhonchi Heart regular rate and rhythm, no murmur appreciated Abd soft, nontender, positive bowel sounds, no organomegaly MSK no focal spinal tenderness, no peripheral edema Neuro: nonfocal  LAB RESULTS: Results for TELLER, WAKEFIELD (MRN [id number]) as of 02/22/2012 10:10  Ref. Range 08/10/2011  11:10 09/23/2011 13:20 11/16/2011 13:04 01/04/2012 11:51 02/07/2012 13:09  PSA Latest Range: <=4.00 ng/mL 0.04 0.26 0.97 0.14 0.06   Results for HESTON, WIDENER (MRN [id number]) as of 02/22/2012 10:10  Ref. Range 08/10/2011 11:10 09/23/2011 13:20 11/16/2011 13:04 01/04/2012 11:51 02/07/2012 13:09  Testosterone Latest Range: 300-890 ng/dL 40.98 (L) 119.14 (L) 782.95 (L) 28.67 (L) 12.49 (L)    Lab Results  Component Value Date   WBC 6.5 02/07/2012   NEUTROABS 4.3 02/07/2012   HGB 13.1 02/07/2012   HCT 36.9* 02/07/2012   MCV 94.4 02/07/2012   PLT 202 02/07/2012      Chemistry      Component Value Date/Time   NA 139 02/07/2012 1309   K 4.4 02/07/2012 1309   CL 105 02/07/2012 1309   CO2 26 02/07/2012 1309   BUN 23 02/07/2012 1309   CREATININE 1.08 02/07/2012 1309      Component Value Date/Time   CALCIUM 10.1 02/07/2012 1309   CALCIUM 10.0 01/31/2007 2246   ALKPHOS 89 02/07/2012 1309   AST 50* 02/07/2012 1309   ALT 44 02/07/2012 1309   BILITOT 0.9 02/07/2012 1309       No results found for this basename: LABCA2    No components found with this basename: LABCA125    No results found for this basename: INR:1;PROTIME:1 in the last 168 hours  Urinalysis    Component Value Date/Time   COLORURINE YELLOW 04/20/2010 1540   APPEARANCEUR CLEAR 04/20/2010 1540   LABSPEC >=1.030 04/20/2010 1540   PHURINE 5.5 04/20/2010 1540   BILIRUBINUR NEGATIVE 04/20/2010 1540   KETONESUR TRACE 04/20/2010 1540   UROBILINOGEN 0.2 04/20/2010 1540   NITRITE NEGATIVE 04/20/2010 1540   LEUKOCYTESUR NEGATIVE 04/20/2010 1540    STUDIES: No new results found.  ASSESSMENT: 78 year old [place] man with a history of prostate  cancer initially biopsied January 2008, Gleason 4+5, with a rapid doubling time, treated initially with Casodex and Lupron with an excellent PSA response, status post radiation to 78 Gy given with curative intent completed October 2008.  Off Lupron as of December 2008. With the eventual development of right hip pain and a rapid PSA doubling time, he resumed Lupron and Casodex September 2011 with the Casodex stopped after a month.  He received  Lupron September 20 and July 25, 2010 with a very good response, then March 02, 2011, and April 2013, again with a significant drop in his PSA.    PLAN: Cuauhtemoc is doing very well, and there is no need for intervention right now. We are going to follow his labs and a every 6 week basis, but change visit to yearly, which will be in March. As soon as we detect arise his testosterone/PSA, we will resume his Lupron, but he seems to maintain a response for several months after each dose, and he is tolerating treatment so far with no significant side effects. He knows to call for any problems that may develop before the next visit.  MAGRINAT,GUSTAV C    02/22/2012

## 2012-02-23 ENCOUNTER — Telehealth: Payer: Self-pay | Admitting: *Deleted

## 2012-02-23 ENCOUNTER — Other Ambulatory Visit: Payer: Self-pay | Admitting: *Deleted

## 2012-02-23 MED ORDER — ZOLPIDEM TARTRATE 5 MG PO TABS: 5.0000 mg | ORAL_TABLET | Freq: Every evening | ORAL | Status: DC | PRN

## 2012-02-23 NOTE — Telephone Encounter (Signed)
i printed rx 

## 2012-02-23 NOTE — Telephone Encounter (Signed)
Rx faxed to [facility].  

## 2012-02-23 NOTE — Telephone Encounter (Signed)
Mailed out calendar to inform the patient of the new date and time of the lab and md appointments

## 2012-02-23 NOTE — Telephone Encounter (Signed)
R'cd fax from [facility] for refill of Zolpidem refill-last written 08/30/2011 #10 with 0 refills-please advise.

## 2012-03-08 ENCOUNTER — Emergency Department (HOSPITAL_COMMUNITY)
Admission: EM | Admit: 2012-03-08 | Discharge: 2012-03-08 | Disposition: A | Discharge status: Home / Self Care | Payer: Medicare Other | Attending: Emergency Medicine | Admitting: Emergency Medicine

## 2012-03-08 ENCOUNTER — Encounter (HOSPITAL_COMMUNITY): Payer: Self-pay | Admitting: *Deleted

## 2012-03-08 ENCOUNTER — Emergency Department (HOSPITAL_COMMUNITY): Payer: Medicare Other

## 2012-03-08 DIAGNOSIS — S0003XA Contusion of scalp, initial encounter: Secondary | ICD-10-CM | POA: Insufficient documentation

## 2012-03-08 DIAGNOSIS — S0083XA Contusion of other part of head, initial encounter: Secondary | ICD-10-CM | POA: Diagnosis not present

## 2012-03-08 DIAGNOSIS — R221 Localized swelling, mass and lump, neck: Secondary | ICD-10-CM | POA: Insufficient documentation

## 2012-03-08 DIAGNOSIS — T07XXXA Unspecified multiple injuries, initial encounter: Secondary | ICD-10-CM

## 2012-03-08 DIAGNOSIS — I2510 Atherosclerotic heart disease of native coronary artery without angina pectoris: Secondary | ICD-10-CM | POA: Diagnosis not present

## 2012-03-08 DIAGNOSIS — IMO0002 Reserved for concepts with insufficient information to code with codable children: Secondary | ICD-10-CM | POA: Insufficient documentation

## 2012-03-08 DIAGNOSIS — I10 Essential (primary) hypertension: Secondary | ICD-10-CM | POA: Diagnosis not present

## 2012-03-08 DIAGNOSIS — S1093XA Contusion of unspecified part of neck, initial encounter: Secondary | ICD-10-CM | POA: Diagnosis not present

## 2012-03-08 DIAGNOSIS — J3489 Other specified disorders of nose and nasal sinuses: Secondary | ICD-10-CM | POA: Insufficient documentation

## 2012-03-08 DIAGNOSIS — R6889 Other general symptoms and signs: Secondary | ICD-10-CM | POA: Diagnosis not present

## 2012-03-08 DIAGNOSIS — M5030 Other cervical disc degeneration, unspecified cervical region: Secondary | ICD-10-CM | POA: Insufficient documentation

## 2012-03-08 DIAGNOSIS — Z043 Encounter for examination and observation following other accident: Secondary | ICD-10-CM | POA: Diagnosis not present

## 2012-03-08 DIAGNOSIS — W19XXXA Unspecified fall, initial encounter: Secondary | ICD-10-CM

## 2012-03-08 DIAGNOSIS — T148XXA Other injury of unspecified body region, initial encounter: Secondary | ICD-10-CM | POA: Insufficient documentation

## 2012-03-08 DIAGNOSIS — Z7982 Long term (current) use of aspirin: Secondary | ICD-10-CM | POA: Diagnosis not present

## 2012-03-08 DIAGNOSIS — R220 Localized swelling, mass and lump, head: Secondary | ICD-10-CM | POA: Insufficient documentation

## 2012-03-08 DIAGNOSIS — W101XXA Fall (on)(from) sidewalk curb, initial encounter: Secondary | ICD-10-CM | POA: Insufficient documentation

## 2012-03-08 DIAGNOSIS — S0510XA Contusion of eyeball and orbital tissues, unspecified eye, initial encounter: Secondary | ICD-10-CM | POA: Diagnosis not present

## 2012-03-08 DIAGNOSIS — S41009A Unspecified open wound of unspecified shoulder, initial encounter: Secondary | ICD-10-CM | POA: Diagnosis not present

## 2012-03-08 DIAGNOSIS — R040 Epistaxis: Secondary | ICD-10-CM | POA: Insufficient documentation

## 2012-03-08 MED ORDER — BACITRACIN ZINC 500 UNIT/GM EX OINT
TOPICAL_OINTMENT | CUTANEOUS | Status: AC
  Administered 2012-03-08: 2
  Filled 2012-03-08: qty 1.8

## 2012-03-08 NOTE — ED Notes (Signed)
Bed:WHALA<BR> Expected date:<BR> Expected time:<BR> Means of arrival:<BR> Comments:<BR> Ems/elderly/fall

## 2012-03-08 NOTE — ED Notes (Signed)
Pt removed from LSB by Dr. Gearlean Alf, RN & Consuella Lose, RN, C-collar remains in place.

## 2012-03-08 NOTE — ED Notes (Signed)
Informed Dr. Lorenso Courier pt's rad results are back. Dr. Lorenso Courier gave consent to remove pt's C-collar

## 2012-03-08 NOTE — ED Provider Notes (Signed)
History     CSN: [id number]  Arrival date & time 03/08/12  1901   First MD Initiated Contact with Patient 03/08/12 1920      Chief Complaint  Patient presents with  . Fall  . Abrasion  . Head Injury    (Consider location/radiation/quality/duration/timing/severity/associated sxs/prior treatment) HPI Comments: Kurt Elliott states he tripped and fell over a curb.  He tried to break his fall but struck his head against the ground.  He denies any LOC but states that he has scrapes on his hands and legs and swelling around his left eye.  He denies any blurred vision and reports most of his discomfort is localized to the left brow and forehead.  Patient is a 78 y.o. male presenting with fall and head injury. The history is provided by the patient. No language interpreter was used.  Fall The accident occurred less than 1 hour ago. The fall occurred while walking. Distance fallen: from standing. He landed on concrete. The volume of blood lost was minimal. The point of impact was the head, left knee and right knee. The pain is present in the head. The pain is at a severity of 3/10. The pain is mild. He was not ambulatory at the scene. There was no entrapment after the fall. There was no drug use involved in the accident. There was no alcohol use involved in the accident. Pertinent negatives include no visual change, no fever, no numbness, no abdominal pain, no bowel incontinence, no nausea, no vomiting, no hematuria, no headaches, no hearing loss and no loss of consciousness. Treatment on scene includes a c-collar and a backboard. He has tried ice for the symptoms. The treatment provided mild relief.  Head Injury  Pertinent negatives include no numbness and no vomiting.    Past Medical History  Diagnosis Date  . CAD (coronary artery disease)     PCI in the past / last catheterization May, 2008, medical therapy  . Hypertension   . Hyperlipemia   . Unspecified disorder of liver   . Cellulitis,  leg     left  . Pulmonary nodule, right     2008 / unchanged CT scan, July, 2009  . Abdominal pain, left lower quadrant   . Adenocarcinoma of prostate   . Esophageal stricture   . Spinal stenosis   . Hyperglycemia   . Hyperthyroidism   . GERD (gastroesophageal reflux disease)   . Hx of colonic polyps   . Renal artery stenosis     Mild  . Atheroma     no penetrating ulcer, Abdominal aorta  . Inferior mesenteric artery injury     High-grade stenosis, CT scan, 2006  . Vertigo   . Diverticulum   . Incisional hernia   . Rash     Ticlid  . Cough     ACE Inhibitor, tolerates ARB  . Carotid artery disease     Doppler, 2006, minimal disease  //  Doppler, May, 2012, mild increase velocities, 40-59% bilateral stenoses  . Renal cyst     Hyperdense cyst lower pole left kidney    Past Surgical History  Procedure Date  . Esophagogastroduodenoscopy   . Cardiac catheterization   . Incisional hernia repair     Family History  Problem Relation Age of Onset  . Breast cancer Sister     2 sisters  . Pemphigus vulgaris Mother     died at 51    History  Substance Use Topics  . Smoking status: Never  Smoker   . Smokeless tobacco: Not on file  . Alcohol Use: No      Review of Systems  Constitutional: Negative for fever, chills, diaphoresis, activity change and fatigue.  HENT: Positive for nosebleeds and facial swelling. Negative for hearing loss, congestion, rhinorrhea, trouble swallowing, neck pain, neck stiffness, voice change and sinus pressure.   Eyes: Negative for photophobia, pain, discharge, redness, itching and visual disturbance.  Respiratory: Negative for apnea, cough, chest tightness and shortness of breath.   Cardiovascular: Negative for chest pain, palpitations and leg swelling.  Gastrointestinal: Negative for nausea, vomiting, abdominal pain and bowel incontinence.  Genitourinary: Negative for dysuria, hematuria, penile pain and testicular pain.  Musculoskeletal:  Positive for arthralgias. Negative for myalgias, back pain, joint swelling and gait problem.  Skin: Positive for wound. Negative for color change, pallor and rash.  Neurological: Negative for tremors, loss of consciousness, syncope, numbness and headaches.       States he should use a cane or walker for support but is not currently using one.  Hematological: Negative.        States he takes a daily aspirin.  Denies any other anticoagulants  Psychiatric/Behavioral: Negative.     Allergies  Lisinopril and Ticlopidine hcl  Home Medications   Current Outpatient Rx  Name Route Sig Dispense Refill  . ASPIRIN 81 MG PO TABS Oral Take 81 mg by mouth daily.      Marland Kitchen FLUTICASONE-SALMETEROL 100-50 MCG/DOSE IN AEPB Inhalation Inhale 1 puff into the lungs every 12 (twelve) hours. As needed 60 each 11  . IPRATROPIUM BROMIDE 0.06 % NA SOLN      . LEUPROLIDE ACETATE 3.75 MG IM KIT  Given by Dr Leodis Liverpool -- not sure of dosage    . MECLIZINE HCL 25 MG PO TABS  TAKE 1 TABLET (25 MG TOTAL) BY MOUTH EVERY 8 (EIGHT) HOURS AS NEEDED FOR DIZZINESS. 30 tablet 1  . METHIMAZOLE 5 MG PO TABS  TAKE 1 TABLET 3 TIMES A WEEK (ON MONDAY, WEDNESDAY, AND FRIDAY) 40 tablet 0    Pt is due for F/U OV with MD for additional refill ...  . METOPROLOL TARTRATE 50 MG PO TABS  TAKE 1 TABLET BY MOUTH TWO TIMES A DAY 180 tablet 3  . MICRONIZED COLESTIPOL HCL 1 G PO TABS Oral Take 5 g by mouth daily.     Marland Kitchen NITROGLYCERIN 0.4 MG SL SUBL Sublingual Place 1 tablet (0.4 mg total) under the tongue every 5 (five) minutes as needed. 50 tablet 0  . OLMESARTAN MEDOXOMIL 5 MG PO TABS Oral Take 2 tablets (10 mg total) by mouth daily. 180 tablet 4  . PANTOPRAZOLE SODIUM 40 MG PO TBEC  TAKE 1 TABLET BY MOUTH EVERY DAY 90 tablet 3  . ZOLPIDEM TARTRATE 5 MG PO TABS Oral Take 1 tablet (5 mg total) by mouth at bedtime as needed for sleep. 30 tablet 0    There were no vitals taken for this visit.  Physical Exam  Nursing note and vitals  reviewed. Constitutional: He is oriented to person, place, and time. He appears well-developed and well-nourished. He is cooperative.  Non-toxic appearance. He has a sickly appearance. He does not appear ill. No distress. Cervical collar and backboard in place.  HENT:  Head: Normocephalic. Head is with abrasion and with contusion. Head is without raccoon's eyes and without laceration. No trismus in the jaw.    Right Ear: External ear normal. No drainage. No middle ear effusion. No hemotympanum.  Left Ear:  External ear normal. No drainage.  No middle ear effusion. No hemotympanum.  Nose: Sinus tenderness present. No mucosal edema or rhinorrhea. Right sinus exhibits no maxillary sinus tenderness and no frontal sinus tenderness. Left sinus exhibits no maxillary sinus tenderness and no frontal sinus tenderness.  Mouth/Throat: Uvula is midline and oropharynx is clear and moist. He does not have dentures. No oral lesions. Normal dentition. No dental abscesses, uvula swelling, lacerations or dental caries. No oropharyngeal exudate.       Dried blood in left naris  Eyes: Conjunctivae and EOM are normal. Pupils are equal, round, and reactive to light. Right eye exhibits no discharge. No scleral icterus.       signif left upper and lower eyelid edema, lids retracted revealing clear sclera.  EOMi bilat.  No pain through ROM.  Neck: Trachea normal and phonation normal. Normal carotid pulses and no hepatojugular reflux present. No tracheal tenderness, no spinous process tenderness and no muscular tenderness present. Carotid bruit is not present. No rigidity. No tracheal deviation, no edema, no erythema and normal range of motion present. No mass and no thyromegaly present.  Cardiovascular: Normal rate, regular rhythm, normal heart sounds and intact distal pulses.  Exam reveals no gallop and no friction rub.   No murmur heard. Pulmonary/Chest: Effort normal and breath sounds normal. No stridor. No respiratory  distress. He has no wheezes. He has no rales. He exhibits no tenderness.  Abdominal: Soft. Bowel sounds are normal. He exhibits no distension and no mass. There is no tenderness. There is no rebound and no guarding.  Musculoskeletal: Normal range of motion. He exhibits no edema and no tenderness.       Full and intact active and passive ROM x 4 extremities, pelvis stable.  Neurological: He is alert and oriented to person, place, and time. No cranial nerve deficit. Coordination normal.  Skin: Skin is warm and dry. No rash noted. He is not diaphoretic. No erythema. No pallor.       Superficial abrasions on bilat hands and overlying bilat knees.  Psychiatric: He has a normal mood and affect. His behavior is normal.    ED Course  Procedures (including critical care time)  Labs Reviewed - No data to display No results found.   No diagnosis found.    MDM  Pt presents s/p a described mechanical fall.  He is alert and oriented with a large left forehead/brow hematoma with associated nasal swelling.  He takes aspirin but no other anticoagulants.  Plan CT head, c-spine, and face + serial exams.  If no significant intercranial or bony injuries encountered, will mobilize slowly and discharge home.  There are no bony injuries noted on the imaging.  He does have sinus disease but he states he has chronic sinus drainage and has previous surgery also.  He has a local ENT specialist.  There are no wounds that require suturing.  He is awake, alert, and oriented.  The swelling has now progressed and I am unable to retract the lids but he is having no eye pain and had clear sclera and normal EOMs on arrival.  Plan localized wound care for abrasions and close outpt f/u.  Encouraged use of a cane or walker also.        Tobin Chad, MD 03/08/12 2215

## 2012-03-08 NOTE — ED Notes (Addendum)
Pt states "I tripped over the curb and fell, couldn't pick myself up"; pt presents from [facility] with head blocks & on LSB, has large hematoma above left eye with abrasion, abrasion to posterior right hand, abrasions to knees bilat, ice pack provided for contusion/hematoma to left eye; pt denies LOC.

## 2012-03-10 DIAGNOSIS — S0010XA Contusion of unspecified eyelid and periocular area, initial encounter: Secondary | ICD-10-CM | POA: Diagnosis not present

## 2012-03-13 ENCOUNTER — Ambulatory Visit (INDEPENDENT_AMBULATORY_CARE_PROVIDER_SITE_OTHER): Payer: Medicare Other | Admitting: Endocrinology

## 2012-03-13 ENCOUNTER — Encounter: Payer: Self-pay | Admitting: Endocrinology

## 2012-03-13 VITALS — BP 124/62 | HR 57 | Temp 97.6°F | Ht 70.0 in | Wt 165.0 lb

## 2012-03-13 DIAGNOSIS — S1093XA Contusion of unspecified part of neck, initial encounter: Secondary | ICD-10-CM

## 2012-03-13 DIAGNOSIS — S0083XA Contusion of other part of head, initial encounter: Secondary | ICD-10-CM

## 2012-03-13 DIAGNOSIS — S0003XA Contusion of scalp, initial encounter: Secondary | ICD-10-CM | POA: Diagnosis not present

## 2012-03-13 MED ORDER — QUAD CANE MISC: 1.0000 | Freq: Once | Status: DC

## 2012-03-13 NOTE — Patient Instructions (Addendum)
Here is a prescription for a "quad-cane," to prevent future falls. Keep the knee wounds covered with antibiotic ointment and a bandaid, clean, and otherwise dry. it is critically important to prevent falling down (keep floor areas well-lit, dry, and free of loose objects.  If you have a cane, walker, or wheelchair, you should use it, even for short trips around the house.  Also, try not to rush.   Resume aspirin.

## 2012-03-13 NOTE — Progress Notes (Signed)
Subjective:    Patient ID: Kurt Elliott, male    DOB: 03/16/1921, 78 y.o.   MRN: [id number]  HPI Pt states 1 week of moderate pain at the face, and assoc pain at the knees.  This started in the context of a fall, at a restaurant.   Past Medical History  Diagnosis Date  . CAD (coronary artery disease)     PCI in the past / last catheterization May, 2008, medical therapy  . Hypertension   . Hyperlipemia   . Unspecified disorder of liver   . Cellulitis, leg     left  . Pulmonary nodule, right     2008 / unchanged CT scan, July, 2009  . Abdominal pain, left lower quadrant   . Adenocarcinoma of prostate   . Esophageal stricture   . Spinal stenosis   . Hyperglycemia   . Hyperthyroidism   . GERD (gastroesophageal reflux disease)   . Hx of colonic polyps   . Renal artery stenosis     Mild  . Atheroma     no penetrating ulcer, Abdominal aorta  . Inferior mesenteric artery injury     High-grade stenosis, CT scan, 2006  . Vertigo   . Diverticulum   . Incisional hernia   . Rash     Ticlid  . Cough     ACE Inhibitor, tolerates ARB  . Carotid artery disease     Doppler, 2006, minimal disease  //  Doppler, May, 2012, mild increase velocities, 40-59% bilateral stenoses  . Renal cyst     Hyperdense cyst lower pole left kidney    Past Surgical History  Procedure Date  . Esophagogastroduodenoscopy   . Cardiac catheterization   . Incisional hernia repair     History   Social History  . Marital Status: Married    Spouse Name: N/A    Number of Children: 4  . Years of Education: N/A   Occupational History  . 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 Smoking status: Never Smoker   . Smokeless tobacco: Not on file  . Alcohol Use: No  . Drug Use: No  . Sexually Active: Not on file   Other Topics Concern  . Not on file   Social History Narrative  . No narrative on file    Current Outpatient Prescriptions on File Prior to Visit  Medication Sig  Dispense Refill  . Fluticasone-Salmeterol (ADVAIR DISKUS) 100-50 MCG/DOSE AEPB Inhale 1 puff into the lungs every 12 (twelve) hours. As needed  60 each  11  . ipratropium (ATROVENT) 0.06 % nasal spray Place 2 sprays into the nose as needed.       Marland Kitchen leuprolide (LUPRON) 3.75 MG injection Given by Dr Leodis Liverpool -- not sure of dosage      . meclizine (ANTIVERT) 25 MG tablet TAKE 1 TABLET (25 MG TOTAL) BY MOUTH EVERY 8 (EIGHT) HOURS AS NEEDED FOR DIZZINESS.  30 tablet  1  . methimazole (TAPAZOLE) 5 MG tablet TAKE 1 TABLET 3 TIMES A WEEK (ON MONDAY, WEDNESDAY, AND FRIDAY)  40 tablet  0  . metoprolol (LOPRESSOR) 50 MG tablet TAKE 1 TABLET BY MOUTH TWO TIMES A DAY  180 tablet  3  . nitroGLYCERIN (NITROSTAT) 0.4 MG SL tablet Place 1 tablet (0.4 mg total) under the tongue every 5 (five) minutes as needed.  50 tablet  0  . olmesartan (BENICAR) 5 MG tablet Take 2 tablets (10 mg total) by mouth daily.  180 tablet  4  . pantoprazole (PROTONIX) 40 MG tablet TAKE 1 TABLET BY MOUTH EVERY DAY  90 tablet  3  . aspirin 81 MG tablet Take 81 mg by mouth daily.        Marland Kitchen MICRONIZED COLESTIPOL HCL 1 G tablet Take 5 g by mouth daily.       Marland Kitchen zolpidem (AMBIEN) 5 MG tablet Take 1 tablet (5 mg total) by mouth at bedtime as needed for sleep.  30 tablet  0    Allergies  Allergen Reactions  . Lisinopril     REACTION: COUGH  . Ticlopidine Hcl     REACTION: rash    Family History  Problem Relation Age of Onset  . Breast cancer Sister     2 sisters  . Pemphigus vulgaris Mother     died at 80    BP 124/62  Pulse 57  Temp 97.6 F (36.4 C) (Oral)  Ht 5\' 10"  (1.778 m)  Wt 165 lb (74.844 kg)  BMI 23.68 kg/m2  SpO2 98%    Review of Systems Denies LOC    Objective:   Physical Exam Face: ecchymoses are resolving Knees:  Abrasions bilaterally Gait: slow but steady   (i reviewed x-ray reports)    Assessment & Plan:  Contusions and abrasions, due to a fall, improving

## 2012-03-17 DIAGNOSIS — S0010XA Contusion of unspecified eyelid and periocular area, initial encounter: Secondary | ICD-10-CM | POA: Diagnosis not present

## 2012-03-20 ENCOUNTER — Telehealth: Payer: Self-pay | Admitting: Oncology

## 2012-03-20 NOTE — Telephone Encounter (Signed)
S/w the pt's wife and she is aware of the sept lab appt and to pick up the rest od the schedules at that time

## 2012-03-29 ENCOUNTER — Other Ambulatory Visit: Payer: Self-pay | Admitting: Gastroenterology

## 2012-04-04 ENCOUNTER — Other Ambulatory Visit: Payer: Medicare Other | Admitting: Lab

## 2012-04-06 ENCOUNTER — Other Ambulatory Visit: Payer: Self-pay | Admitting: Gastroenterology

## 2012-04-10 ENCOUNTER — Other Ambulatory Visit (HOSPITAL_BASED_OUTPATIENT_CLINIC_OR_DEPARTMENT_OTHER): Payer: Medicare Other | Admitting: Lab

## 2012-04-10 DIAGNOSIS — C61 Malignant neoplasm of prostate: Secondary | ICD-10-CM

## 2012-04-10 LAB — COMPREHENSIVE METABOLIC PANEL (CC13)
ALT: 50 U/L (ref 0–55)
AST: 55 U/L — ABNORMAL HIGH (ref 5–34)
Albumin: 3.8 g/dL (ref 3.5–5.0)
Alkaline Phosphatase: 98 U/L (ref 40–150)
Calcium: 9.8 mg/dL (ref 8.4–10.4)
Chloride: 104 mEq/L (ref 98–107)
Potassium: 4.2 mEq/L (ref 3.5–5.1)

## 2012-04-10 LAB — CBC WITH DIFFERENTIAL/PLATELET
Eosinophils Absolute: 0.2 10*3/uL (ref 0.0–0.5)
MONO#: 0.3 10*3/uL (ref 0.1–0.9)
NEUT#: 4.5 10*3/uL (ref 1.5–6.5)
RBC: 3.81 10*6/uL — ABNORMAL LOW (ref 4.20–5.82)
RDW: 12.3 % (ref 11.0–14.6)
WBC: 6.4 10*3/uL (ref 4.0–10.3)
lymph#: 1.4 10*3/uL (ref 0.9–3.3)

## 2012-04-10 LAB — TESTOSTERONE: Testosterone: 10.86 ng/dL — ABNORMAL LOW (ref 300–890)

## 2012-04-10 LAB — PSA: PSA: 0.06 ng/mL (ref <=4.00)

## 2012-04-12 DIAGNOSIS — H9110 Presbycusis, unspecified ear: Secondary | ICD-10-CM | POA: Diagnosis not present

## 2012-04-12 DIAGNOSIS — H81319 Aural vertigo, unspecified ear: Secondary | ICD-10-CM | POA: Diagnosis not present

## 2012-04-12 DIAGNOSIS — B369 Superficial mycosis, unspecified: Secondary | ICD-10-CM | POA: Diagnosis not present

## 2012-04-12 DIAGNOSIS — H6120 Impacted cerumen, unspecified ear: Secondary | ICD-10-CM | POA: Diagnosis not present

## 2012-04-13 ENCOUNTER — Other Ambulatory Visit: Payer: Self-pay | Admitting: Oncology

## 2012-04-24 DIAGNOSIS — H6240 Otitis externa in other diseases classified elsewhere, unspecified ear: Secondary | ICD-10-CM | POA: Diagnosis not present

## 2012-04-24 DIAGNOSIS — H6980 Other specified disorders of Eustachian tube, unspecified ear: Secondary | ICD-10-CM | POA: Diagnosis not present

## 2012-04-24 DIAGNOSIS — B369 Superficial mycosis, unspecified: Secondary | ICD-10-CM | POA: Diagnosis not present

## 2012-04-25 ENCOUNTER — Other Ambulatory Visit: Payer: Self-pay | Admitting: Endocrinology

## 2012-04-25 ENCOUNTER — Ambulatory Visit (INDEPENDENT_AMBULATORY_CARE_PROVIDER_SITE_OTHER): Payer: Medicare Other | Admitting: Endocrinology

## 2012-04-25 DIAGNOSIS — Z23 Encounter for immunization: Secondary | ICD-10-CM | POA: Diagnosis not present

## 2012-04-25 MED ORDER — ZOLPIDEM TARTRATE 5 MG PO TABS: 5.0000 mg | ORAL_TABLET | Freq: Every evening | ORAL | Status: DC | PRN

## 2012-04-25 NOTE — Addendum Note (Signed)
Addended by: Jackson Latino on: 04/25/2012 02:09 PM   Modules accepted: Orders

## 2012-04-25 NOTE — Telephone Encounter (Signed)
Pt requesting AMBIEN refill last filled on 02/23/12 and Pt last seen on 03/13/12. Please Advise.

## 2012-04-26 ENCOUNTER — Other Ambulatory Visit: Payer: Self-pay | Admitting: Endocrinology

## 2012-05-03 DIAGNOSIS — B369 Superficial mycosis, unspecified: Secondary | ICD-10-CM | POA: Diagnosis not present

## 2012-05-03 DIAGNOSIS — H6120 Impacted cerumen, unspecified ear: Secondary | ICD-10-CM | POA: Diagnosis not present

## 2012-05-03 DIAGNOSIS — H9210 Otorrhea, unspecified ear: Secondary | ICD-10-CM | POA: Diagnosis not present

## 2012-05-03 DIAGNOSIS — H6240 Otitis externa in other diseases classified elsewhere, unspecified ear: Secondary | ICD-10-CM | POA: Diagnosis not present

## 2012-05-16 ENCOUNTER — Other Ambulatory Visit: Payer: Medicare Other | Admitting: Lab

## 2012-05-18 DIAGNOSIS — H6240 Otitis externa in other diseases classified elsewhere, unspecified ear: Secondary | ICD-10-CM | POA: Diagnosis not present

## 2012-05-18 DIAGNOSIS — B369 Superficial mycosis, unspecified: Secondary | ICD-10-CM | POA: Diagnosis not present

## 2012-05-19 ENCOUNTER — Other Ambulatory Visit: Payer: Self-pay | Admitting: Endocrinology

## 2012-05-22 ENCOUNTER — Other Ambulatory Visit (HOSPITAL_BASED_OUTPATIENT_CLINIC_OR_DEPARTMENT_OTHER): Payer: Medicare Other

## 2012-05-22 DIAGNOSIS — C61 Malignant neoplasm of prostate: Secondary | ICD-10-CM | POA: Diagnosis not present

## 2012-05-22 LAB — CBC WITH DIFFERENTIAL/PLATELET
Basophils Absolute: 0 10*3/uL (ref 0.0–0.1)
Eosinophils Absolute: 0.3 10*3/uL (ref 0.0–0.5)
HCT: 37.4 % — ABNORMAL LOW (ref 38.4–49.9)
LYMPH%: 24 % (ref 14.0–49.0)
MCV: 96.4 fL (ref 79.3–98.0)
MONO#: 0.3 10*3/uL (ref 0.1–0.9)
MONO%: 4.9 % (ref 0.0–14.0)
NEUT#: 4 10*3/uL (ref 1.5–6.5)
NEUT%: 66.4 % (ref 39.0–75.0)
Platelets: 191 10*3/uL (ref 140–400)
RBC: 3.88 10*6/uL — ABNORMAL LOW (ref 4.20–5.82)

## 2012-05-22 LAB — COMPREHENSIVE METABOLIC PANEL (CC13)
Albumin: 4.1 g/dL (ref 3.5–5.0)
BUN: 27 mg/dL — ABNORMAL HIGH (ref 7.0–26.0)
CO2: 22 mEq/L (ref 22–29)
Calcium: 10.7 mg/dL — ABNORMAL HIGH (ref 8.4–10.4)
Chloride: 106 mEq/L (ref 98–107)
Creatinine: 1.3 mg/dL (ref 0.7–1.3)
Glucose: 143 mg/dl — ABNORMAL HIGH (ref 70–99)
Potassium: 4.1 mEq/L (ref 3.5–5.1)

## 2012-05-22 LAB — PSA: PSA: 0.11 ng/mL (ref <=4.00)

## 2012-05-23 ENCOUNTER — Other Ambulatory Visit: Payer: Self-pay | Admitting: Oncology

## 2012-05-31 ENCOUNTER — Ambulatory Visit (HOSPITAL_BASED_OUTPATIENT_CLINIC_OR_DEPARTMENT_OTHER): Payer: Medicare Other

## 2012-05-31 VITALS — BP 148/66 | HR 67 | Temp 97.6°F

## 2012-05-31 DIAGNOSIS — C61 Malignant neoplasm of prostate: Secondary | ICD-10-CM | POA: Diagnosis not present

## 2012-05-31 DIAGNOSIS — Z5111 Encounter for antineoplastic chemotherapy: Secondary | ICD-10-CM | POA: Diagnosis not present

## 2012-05-31 MED ORDER — LEUPROLIDE ACETATE (3 MONTH) 22.5 MG IM KIT
22.5000 mg | PACK | Freq: Once | INTRAMUSCULAR | Status: AC
  Administered 2012-05-31: 22.5 mg via INTRAMUSCULAR
  Filled 2012-05-31: qty 22.5

## 2012-06-07 DIAGNOSIS — B369 Superficial mycosis, unspecified: Secondary | ICD-10-CM | POA: Diagnosis not present

## 2012-06-07 DIAGNOSIS — R040 Epistaxis: Secondary | ICD-10-CM | POA: Diagnosis not present

## 2012-06-17 ENCOUNTER — Other Ambulatory Visit: Payer: Self-pay | Admitting: Endocrinology

## 2012-06-27 ENCOUNTER — Other Ambulatory Visit: Payer: Medicare Other | Admitting: Lab

## 2012-07-03 ENCOUNTER — Other Ambulatory Visit: Payer: Medicare Other | Admitting: Lab

## 2012-07-04 ENCOUNTER — Telehealth: Payer: Self-pay | Admitting: Oncology

## 2012-07-04 ENCOUNTER — Other Ambulatory Visit (HOSPITAL_BASED_OUTPATIENT_CLINIC_OR_DEPARTMENT_OTHER): Payer: Medicare Other | Admitting: Lab

## 2012-07-04 DIAGNOSIS — C61 Malignant neoplasm of prostate: Secondary | ICD-10-CM | POA: Diagnosis not present

## 2012-07-04 LAB — CBC WITH DIFFERENTIAL/PLATELET
Basophils Absolute: 0 10*3/uL (ref 0.0–0.1)
EOS%: 3.7 % (ref 0.0–7.0)
HCT: 36.9 % — ABNORMAL LOW (ref 38.4–49.9)
HGB: 13 g/dL (ref 13.0–17.1)
MCH: 34.2 pg — ABNORMAL HIGH (ref 27.2–33.4)
MONO#: 0.3 10*3/uL (ref 0.1–0.9)
NEUT#: 3.6 10*3/uL (ref 1.5–6.5)
NEUT%: 67 % (ref 39.0–75.0)
RDW: 12.6 % (ref 11.0–14.6)
WBC: 5.3 10*3/uL (ref 4.0–10.3)
lymph#: 1.3 10*3/uL (ref 0.9–3.3)

## 2012-07-04 LAB — COMPREHENSIVE METABOLIC PANEL (CC13)
ALT: 50 U/L (ref 0–55)
AST: 52 U/L — ABNORMAL HIGH (ref 5–34)
CO2: 25 mEq/L (ref 22–29)
Calcium: 9.4 mg/dL (ref 8.4–10.4)
Chloride: 106 mEq/L (ref 98–107)
Sodium: 140 mEq/L (ref 136–145)
Total Bilirubin: 1.08 mg/dL (ref 0.20–1.20)
Total Protein: 6.9 g/dL (ref 6.4–8.3)

## 2012-07-04 NOTE — Telephone Encounter (Signed)
gve the pt his jan-June 2014 appt calendar

## 2012-07-05 ENCOUNTER — Other Ambulatory Visit: Payer: Self-pay | Admitting: Dermatology

## 2012-07-05 DIAGNOSIS — L570 Actinic keratosis: Secondary | ICD-10-CM | POA: Diagnosis not present

## 2012-07-05 DIAGNOSIS — L538 Other specified erythematous conditions: Secondary | ICD-10-CM | POA: Diagnosis not present

## 2012-07-05 DIAGNOSIS — D485 Neoplasm of uncertain behavior of skin: Secondary | ICD-10-CM | POA: Diagnosis not present

## 2012-07-05 DIAGNOSIS — C44319 Basal cell carcinoma of skin of other parts of face: Secondary | ICD-10-CM | POA: Diagnosis not present

## 2012-07-06 LAB — TESTOSTERONE: Testosterone: 36.07 ng/dL — ABNORMAL LOW (ref 300–890)

## 2012-07-06 LAB — PSA: PSA: 0.07 ng/mL (ref <=4.00)

## 2012-07-13 ENCOUNTER — Other Ambulatory Visit: Payer: Self-pay

## 2012-07-13 MED ORDER — OLMESARTAN MEDOXOMIL 5 MG PO TABS: 10.0000 mg | ORAL_TABLET | Freq: Every day | ORAL | Status: DC

## 2012-08-08 ENCOUNTER — Other Ambulatory Visit: Payer: Medicare Other | Admitting: Lab

## 2012-08-15 ENCOUNTER — Other Ambulatory Visit (HOSPITAL_BASED_OUTPATIENT_CLINIC_OR_DEPARTMENT_OTHER): Payer: Medicare Other | Admitting: Lab

## 2012-08-15 DIAGNOSIS — C61 Malignant neoplasm of prostate: Secondary | ICD-10-CM | POA: Diagnosis not present

## 2012-08-15 LAB — COMPREHENSIVE METABOLIC PANEL (CC13)
AST: 51 U/L — ABNORMAL HIGH (ref 5–34)
Alkaline Phosphatase: 101 U/L (ref 40–150)
BUN: 22 mg/dL (ref 7.0–26.0)
Creatinine: 1.3 mg/dL (ref 0.7–1.3)
Glucose: 141 mg/dl — ABNORMAL HIGH (ref 70–99)
Potassium: 4.4 mEq/L (ref 3.5–5.1)
Total Bilirubin: 0.66 mg/dL (ref 0.20–1.20)

## 2012-08-15 LAB — CBC WITH DIFFERENTIAL/PLATELET
BASO%: 0.3 % (ref 0.0–2.0)
EOS%: 4.7 % (ref 0.0–7.0)
HCT: 36.1 % — ABNORMAL LOW (ref 38.4–49.9)
LYMPH%: 24.4 % (ref 14.0–49.0)
MCH: 34.2 pg — ABNORMAL HIGH (ref 27.2–33.4)
MCHC: 36.2 g/dL — ABNORMAL HIGH (ref 32.0–36.0)
MONO#: 0.3 10*3/uL (ref 0.1–0.9)
NEUT%: 65.4 % (ref 39.0–75.0)
Platelets: 184 10*3/uL (ref 140–400)
RBC: 3.82 10*6/uL — ABNORMAL LOW (ref 4.20–5.82)
WBC: 6.8 10*3/uL (ref 4.0–10.3)

## 2012-08-15 LAB — PSA: PSA: 0.04 ng/mL (ref <=4.00)

## 2012-09-13 DIAGNOSIS — J310 Chronic rhinitis: Secondary | ICD-10-CM | POA: Diagnosis not present

## 2012-09-13 DIAGNOSIS — H6240 Otitis externa in other diseases classified elsewhere, unspecified ear: Secondary | ICD-10-CM | POA: Diagnosis not present

## 2012-09-13 DIAGNOSIS — R040 Epistaxis: Secondary | ICD-10-CM | POA: Diagnosis not present

## 2012-09-13 DIAGNOSIS — B369 Superficial mycosis, unspecified: Secondary | ICD-10-CM | POA: Diagnosis not present

## 2012-09-13 DIAGNOSIS — H6120 Impacted cerumen, unspecified ear: Secondary | ICD-10-CM | POA: Diagnosis not present

## 2012-09-19 ENCOUNTER — Other Ambulatory Visit: Payer: Medicare Other | Admitting: Lab

## 2012-09-26 ENCOUNTER — Telehealth: Payer: Self-pay | Admitting: Oncology

## 2012-09-26 ENCOUNTER — Other Ambulatory Visit (HOSPITAL_BASED_OUTPATIENT_CLINIC_OR_DEPARTMENT_OTHER): Payer: Medicare Other | Admitting: Lab

## 2012-09-26 DIAGNOSIS — C61 Malignant neoplasm of prostate: Secondary | ICD-10-CM | POA: Diagnosis not present

## 2012-09-26 LAB — CBC WITH DIFFERENTIAL/PLATELET
BASO%: 0.2 % (ref 0.0–2.0)
Basophils Absolute: 0 10e3/uL (ref 0.0–0.1)
EOS%: 4.6 % (ref 0.0–7.0)
Eosinophils Absolute: 0.3 10e3/uL (ref 0.0–0.5)
HCT: 35.3 % — ABNORMAL LOW (ref 38.4–49.9)
HGB: 12.7 g/dL — ABNORMAL LOW (ref 13.0–17.1)
LYMPH%: 23.6 % (ref 14.0–49.0)
MCH: 33.2 pg (ref 27.2–33.4)
MCHC: 36 g/dL (ref 32.0–36.0)
MCV: 92.4 fL (ref 79.3–98.0)
MONO#: 0.3 10e3/uL (ref 0.1–0.9)
MONO%: 4.9 % (ref 0.0–14.0)
NEUT#: 4 10e3/uL (ref 1.5–6.5)
NEUT%: 66.7 % (ref 39.0–75.0)
Platelets: 184 10e3/uL (ref 140–400)
RBC: 3.82 10e6/uL — ABNORMAL LOW (ref 4.20–5.82)
RDW: 12.6 % (ref 11.0–14.6)
WBC: 5.9 10e3/uL (ref 4.0–10.3)
lymph#: 1.4 10e3/uL (ref 0.9–3.3)

## 2012-09-26 LAB — COMPREHENSIVE METABOLIC PANEL WITH GFR
ALT: 38 U/L (ref 0–53)
AST: 50 U/L — ABNORMAL HIGH (ref 0–37)
Albumin: 3.7 g/dL (ref 3.5–5.2)
Alkaline Phosphatase: 109 U/L (ref 39–117)
BUN: 20 mg/dL (ref 6–23)
CO2: 23 meq/L (ref 19–32)
Calcium: 9.9 mg/dL (ref 8.4–10.5)
Chloride: 105 meq/L (ref 96–112)
Creatinine, Ser: 0.98 mg/dL (ref 0.50–1.35)
Glucose, Bld: 153 mg/dL — ABNORMAL HIGH (ref 70–99)
Potassium: 4.1 meq/L (ref 3.5–5.3)
Sodium: 140 meq/L (ref 135–145)
Total Bilirubin: 0.6 mg/dL (ref 0.3–1.2)
Total Protein: 7.4 g/dL (ref 6.0–8.3)

## 2012-09-26 LAB — TESTOSTERONE: Testosterone: 11 ng/dL — ABNORMAL LOW (ref 300–890)

## 2012-10-17 ENCOUNTER — Telehealth: Payer: Self-pay

## 2012-10-17 NOTE — Telephone Encounter (Signed)
Spoke with pts wife regarding results of PSA. Informed her that PSA was ok per KM/GM. She voiced understanding and had no questions. TMB

## 2012-11-07 ENCOUNTER — Other Ambulatory Visit: Payer: Medicare Other | Admitting: Lab

## 2012-11-15 DIAGNOSIS — H1044 Vernal conjunctivitis: Secondary | ICD-10-CM | POA: Diagnosis not present

## 2012-11-21 ENCOUNTER — Other Ambulatory Visit (HOSPITAL_BASED_OUTPATIENT_CLINIC_OR_DEPARTMENT_OTHER): Payer: Medicare Other | Admitting: Lab

## 2012-11-21 DIAGNOSIS — C61 Malignant neoplasm of prostate: Secondary | ICD-10-CM | POA: Diagnosis not present

## 2012-11-21 LAB — COMPREHENSIVE METABOLIC PANEL (CC13)
ALT: 37 U/L (ref 0–55)
CO2: 25 mEq/L (ref 22–29)
Calcium: 9.6 mg/dL (ref 8.4–10.4)
Chloride: 108 mEq/L — ABNORMAL HIGH (ref 98–107)
Glucose: 117 mg/dl — ABNORMAL HIGH (ref 70–99)
Sodium: 141 mEq/L (ref 136–145)
Total Protein: 7.2 g/dL (ref 6.4–8.3)

## 2012-11-21 LAB — CBC WITH DIFFERENTIAL/PLATELET
BASO%: 0.3 % (ref 0.0–2.0)
MCHC: 35 g/dL (ref 32.0–36.0)
MONO#: 0.3 10*3/uL (ref 0.1–0.9)
RBC: 3.66 10*6/uL — ABNORMAL LOW (ref 4.20–5.82)
WBC: 5.3 10*3/uL (ref 4.0–10.3)
lymph#: 1.1 10*3/uL (ref 0.9–3.3)

## 2012-11-21 LAB — PSA: PSA: 0.07 ng/mL (ref <=4.00)

## 2012-11-28 ENCOUNTER — Other Ambulatory Visit: Payer: Medicare Other | Admitting: Lab

## 2012-12-05 ENCOUNTER — Ambulatory Visit: Payer: Medicare Other | Admitting: Oncology

## 2012-12-12 ENCOUNTER — Ambulatory Visit: Payer: Medicare Other | Admitting: Oncology

## 2012-12-13 ENCOUNTER — Telehealth: Payer: Self-pay | Admitting: Cardiology

## 2012-12-13 NOTE — Telephone Encounter (Signed)
New problem    Pt has questions regarding last cartoid

## 2012-12-13 NOTE — Telephone Encounter (Signed)
Pt received a letter stating that it is time to schedule is carotid doppler.  He is waiting for a call from scheduling.

## 2012-12-14 ENCOUNTER — Other Ambulatory Visit: Payer: Self-pay

## 2012-12-14 MED ORDER — METOPROLOL TARTRATE 50 MG PO TABS: 50.0000 mg | ORAL_TABLET | Freq: Two times a day (BID) | ORAL | Status: DC

## 2012-12-14 NOTE — Telephone Encounter (Signed)
metoprolol (LOPRESSOR) 50 MG tablet  TAKE 1 TABLET BY MOUTH TWO TIMES A DAY   180 tablet   3    Patient Instructions  Your physician recommends that you schedule a follow-up appointment in: 12 months, the office will mail you a reminder letter 2 months prior the appointment date. Your physician recommends that you continue on your current medications as directed. Please refer to the Current Medication list given to you today. Previous Visit  Provider Department Encounter #  12/02/2011  2:00 PM [name], MD [facility] [id number]

## 2012-12-19 ENCOUNTER — Other Ambulatory Visit: Payer: Medicare Other | Admitting: Lab

## 2012-12-26 ENCOUNTER — Ambulatory Visit: Payer: Medicare Other | Admitting: Oncology

## 2013-01-02 ENCOUNTER — Other Ambulatory Visit (HOSPITAL_BASED_OUTPATIENT_CLINIC_OR_DEPARTMENT_OTHER): Payer: Medicare Other

## 2013-01-02 DIAGNOSIS — C61 Malignant neoplasm of prostate: Secondary | ICD-10-CM

## 2013-01-02 LAB — CBC WITH DIFFERENTIAL/PLATELET
BASO%: 0.5 % (ref 0.0–2.0)
Eosinophils Absolute: 0.3 10*3/uL (ref 0.0–0.5)
MCHC: 36.2 g/dL — ABNORMAL HIGH (ref 32.0–36.0)
MONO#: 0.4 10*3/uL (ref 0.1–0.9)
NEUT#: 4.3 10*3/uL (ref 1.5–6.5)
RBC: 3.79 10*6/uL — ABNORMAL LOW (ref 4.20–5.82)
RDW: 12.7 % (ref 11.0–14.6)
WBC: 6.4 10*3/uL (ref 4.0–10.3)
lymph#: 1.4 10*3/uL (ref 0.9–3.3)

## 2013-01-02 LAB — COMPREHENSIVE METABOLIC PANEL (CC13)
ALT: 36 U/L (ref 0–55)
AST: 50 U/L — ABNORMAL HIGH (ref 5–34)
Albumin: 3.7 g/dL (ref 3.5–5.0)
Alkaline Phosphatase: 98 U/L (ref 40–150)
BUN: 20.4 mg/dL (ref 7.0–26.0)
Potassium: 4.1 mEq/L (ref 3.5–5.1)
Sodium: 141 mEq/L (ref 136–145)
Total Protein: 7.3 g/dL (ref 6.4–8.3)

## 2013-01-02 LAB — PSA: PSA: 0.25 ng/mL (ref <=4.00)

## 2013-01-02 LAB — TESTOSTERONE: Testosterone: 149 ng/dL — ABNORMAL LOW (ref 300–890)

## 2013-01-09 ENCOUNTER — Ambulatory Visit (HOSPITAL_BASED_OUTPATIENT_CLINIC_OR_DEPARTMENT_OTHER): Payer: Medicare Other | Admitting: Oncology

## 2013-01-09 ENCOUNTER — Telehealth: Payer: Self-pay | Admitting: Oncology

## 2013-01-09 VITALS — BP 139/57 | HR 66 | Temp 97.9°F | Resp 20 | Ht 70.0 in | Wt 164.9 lb

## 2013-01-09 DIAGNOSIS — C61 Malignant neoplasm of prostate: Secondary | ICD-10-CM

## 2013-01-09 DIAGNOSIS — Z5111 Encounter for antineoplastic chemotherapy: Secondary | ICD-10-CM

## 2013-01-09 MED ORDER — LEUPROLIDE ACETATE (3 MONTH) 22.5 MG IM KIT
22.5000 mg | PACK | Freq: Once | INTRAMUSCULAR | Status: AC
  Administered 2013-01-09: 22.5 mg via INTRAMUSCULAR
  Filled 2013-01-09: qty 22.5

## 2013-01-09 NOTE — Progress Notes (Signed)
ID: Kurt Elliott   DOB: 1921/05/24  MR#: [id number]  BJY#:[id number]  PCP: Kurt Belling, MD GYN: SU:  OTHER MD: Kurt Elliott (FAX [phone]); Kurt Elliott; Kurt Elliott; Kurt Elliott; Kurt Elliott; Kurt Elliott   HISTORY OF PRESENT ILLNESS: [name] tells me his PSA and prostate had been followed for a very long time.  His PSA was approximately 15 about a year ago [2007], then about 6 months ago it went to 50, which means that it had just about doubled in 6 months.  Of course, that raised red flags and the patient had his prostate biopsied at [facility] under Kurt Elliott 08/02/2006.  This showed a Gleason 9 (4+5) adenocarcinoma in 5 of the 6 cores, the other core showing a Gleason 8 (3+5).  In short, an aggressive tumor.  The patient had a bone scan 08/10/2006, which was negative, and CTs of the abdomen and pelvis 08/15/2006, which showed an enlarged prostate at 5.7 x 5.1 cm, but no evidence of adenopathy or extracapsular spread; no obvious invasion of the rectum or the bladder; and negative bone windows.  There was incidental nodularity around the left perirenal fat of unclear significance.   The patient was started on Casodex for about a month before receiving his first Lupron shot 09/05/2006.  He had a second Lupron shot 09/09/2006.  His PSA responded well, and his subsequent history is as detailed below.  INTERVAL HISTORY: Kurt Elliott returns today with his wife Kurt Elliott for followup of his prostate cancer. The interval history is generally unremarkable. The recently traveled to [place] 4 grandsons wedding, and they're planning a trip to [place]. He tells me his son Kurt Elliott, who is a route I., has been diagnosed with lymphoma and is receiving chemotherapy (he lives in [place]).   REVIEW OF SYSTEMS: The only thing that is really bothering him is the nocturia, 2-3 times a night. He eats late, but then he goes to bed late as well, around midnight. We talked about his not having anything to drink after 9  PM. He tells me he has a good stream. Otherwise a detailed review of systems today was entirely negative, and in particular he denies pain, worsening or progressive fatigue, fever, rash, or bleeding. A detailed review of systems was otherwise noncontributory. He continues to swim on a regular basis.  PAST MEDICAL HISTORY: Past Medical History  Diagnosis Date  . CAD (coronary artery disease)     PCI in the past / last catheterization May, 2008, medical therapy  . Hypertension   . Hyperlipemia   . Unspecified disorder of liver   . Cellulitis, leg     left  . Pulmonary nodule, right     2008 / unchanged CT scan, July, 2009  . Abdominal pain, left lower quadrant   . Adenocarcinoma of prostate   . Esophageal stricture   . Spinal stenosis   . Hyperglycemia   . Hyperthyroidism   . GERD (gastroesophageal reflux disease)   . Hx of colonic polyps   . Renal artery stenosis     Mild  . Atheroma     no penetrating ulcer, Abdominal aorta  . Inferior mesenteric artery injury     High-grade stenosis, CT scan, 2006  . Vertigo   . Diverticulum   . Incisional hernia   . Rash     Ticlid  . Cough     ACE Inhibitor, tolerates ARB  . Carotid artery disease     Doppler, 2006, minimal disease  //  Doppler,  May, 2012, mild increase velocities, 40-59% bilateral stenoses  . Renal cyst     Hyperdense cyst lower pole left kidney   PAST SURGICAL HISTORY: Past Surgical History  Procedure Laterality Date  . Esophagogastroduodenoscopy    . Cardiac catheterization    . Incisional hernia repair      FAMILY HISTORY Family History  Problem Relation Age of Onset  . Breast cancer Sister     2 sisters  . Pemphigus vulgaris Mother     died at 85  The patient's father died at the age of 76 from an embolus.  The patient's mother died at the age of 62 in the setting of dementia.  The patient had 1 sister die with breast cancer (neglected), 1 sister died with diabetes, and 1 brother with Parkinson's  disease.  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  He has been married 60+ years to Kurt Elliott, who used to be a Scientific laboratory technician.  They have 4 children; 2 of them are Urologists in [place] (married to twin sisters).  In particular, they would like me to contact Kurt Elliott [phone] is the fax #) with any plans.  One of the sons, the oldest one, is a Rabbi in [place] and then the daughter, Kurt Elliott, is our Kurt. [place] Elliott wife. The patient has a total of 12 grandchildren and is waiting on great-grandchildren.   ADVANCED DIRECTIVES: in place  HEALTH MAINTENANCE: History  Substance Use Topics  . Smoking status: Never Smoker   . Smokeless tobacco: Not on file  . Alcohol Use: No     Colonoscopy: 2005  Bone density:  Lipid panel:  Allergies  Allergen Reactions  . Lisinopril     REACTION: COUGH  . Ticlopidine Hcl     REACTION: rash    Current Outpatient Prescriptions  Medication Sig Dispense Refill  . aspirin 81 MG tablet Take 81 mg by mouth daily.        . Fluticasone-Salmeterol (ADVAIR DISKUS) 100-50 MCG/DOSE AEPB Inhale 1 puff into the lungs every 12 (twelve) hours. As needed  60 each  11  . ipratropium (ATROVENT) 0.06 % nasal spray Place 2 sprays into the nose as needed.       Marland Kitchen leuprolide (LUPRON) 3.75 MG injection Given by Kurt Elliott -- not sure of dosage      . meclizine (ANTIVERT) 25 MG tablet TAKE 1 TABLET (25 MG TOTAL) BY MOUTH EVERY 8 (EIGHT) HOURS AS NEEDED FOR DIZZINESS.  30 tablet  1  . methimazole (TAPAZOLE) 5 MG tablet TAKE 1 TABLET THREE TIMES A WEEK ON MONDAY, WEDNESDAY AND FRIDAY  40 tablet  1  . metoprolol (LOPRESSOR) 50 MG tablet Take 1 tablet (50 mg total) by mouth 2 (two) times daily.  60 tablet  0  . MICRONIZED COLESTIPOL HCL 1 G tablet Take 5 g by mouth daily.       Marland Kitchen MICRONIZED COLESTIPOL HCL 1 G tablet TAKE 5 TABLETS BY MOUTH EVERY DAY  450 tablet  3  . Misc. Devices (QUAD CANE) MISC 1 Device by Does not apply route once.  1 each  0  .  nitroGLYCERIN (NITROSTAT) 0.4 MG SL tablet Place 1 tablet (0.4 mg total) under the tongue every 5 (five) minutes as needed.  50 tablet  0  . olmesartan (BENICAR) 5 MG tablet Take 2 tablets (10 mg total) by mouth daily.  180 tablet  2  . pantoprazole (PROTONIX) 40 MG tablet TAKE 1 TABLET BY MOUTH EVERY DAY  90 tablet  3  . pantoprazole (PROTONIX) 40 MG tablet TAKE 1 TABLET (40 MG TOTAL) BY MOUTH DAILY.  90 tablet  3  . zolpidem (AMBIEN) 5 MG tablet Take 1 tablet (5 mg total) by mouth at bedtime as needed for sleep.  30 tablet  0   Current Facility-Administered Medications  Medication Dose Route Frequency Provider Last Rate Last Dose  . leuprolide (LUPRON) injection 22.5 mg  22.5 mg Intramuscular Once Lowella Dell, MD        OBJECTIVE: Elderly white male in no acute distress Filed Vitals:   01/09/13 1030  BP: 139/57  Pulse: 66  Temp: 97.9 F (36.6 C)  Resp: 20     Body mass index is 23.66 kg/(m^2).    ECOG FS: 1  Sclerae unicteric Oropharynx clear No peripheral adenopathy Lungs no rales or rhonchi Heart regular rate and rhythm, no murmur appreciated Abd soft, nontender, positive bowel sounds, no organomegaly MSK no focal spinal tenderness, no peripheral edema Neuro: nonfocal, well oriented, pleasant affect  LAB RESULTS: Results for LOGIN, MUCKLEROY (MRN [id number]) as of 01/09/2013 11:06  Ref. Range 07/04/2012 10:45 08/15/2012 10:01 09/26/2012 10:52 11/21/2012 11:02 01/02/2013 10:59  PSA Latest Range: <=4.00 ng/mL 0.07 0.04 0.04 0.07 0.25   Results for REY, DANSBY (MRN [id number]) as of 01/09/2013 11:06  Ref. Range 07/04/2012 10:45 08/15/2012 10:01 09/26/2012 10:52 11/21/2012 11:02 01/02/2013 10:59  Testosterone Latest Range: 300-890 ng/dL 13.08 (L) 65.78 (L) 11 (L) 81 (L) 149 (L)    Lab Results  Component Value Date   WBC 6.4 01/02/2013   NEUTROABS 4.3 01/02/2013   HGB 12.8* 01/02/2013   HCT 35.3* 01/02/2013   MCV 93.2 01/02/2013   PLT 168 01/02/2013      Chemistry       Component Value Date/Time   NA 141 01/02/2013 1059   NA 140 09/26/2012 1141   K 4.1 01/02/2013 1059   K 4.1 09/26/2012 1141   CL 107 01/02/2013 1059   CL 105 09/26/2012 1141   CO2 24 01/02/2013 1059   CO2 23 09/26/2012 1141   BUN 20.4 01/02/2013 1059   BUN 20 09/26/2012 1141   CREATININE 1.1 01/02/2013 1059   CREATININE 0.98 09/26/2012 1141      Component Value Date/Time   CALCIUM 9.9 01/02/2013 1059   CALCIUM 9.9 09/26/2012 1141   CALCIUM 10.0 01/31/2007 2246   ALKPHOS 98 01/02/2013 1059   ALKPHOS 109 09/26/2012 1141   AST 50* 01/02/2013 1059   AST 50* 09/26/2012 1141   ALT 36 01/02/2013 1059   ALT 38 09/26/2012 1141   BILITOT 1.09 01/02/2013 1059   BILITOT 0.6 09/26/2012 1141       No results found for this basename: LABCA2    No components found with this basename: LABCA125    No results found for this basename: INR,  in the last 168 hours  Urinalysis    Component Value Date/Time   COLORURINE YELLOW 04/20/2010 1540   APPEARANCEUR CLEAR 04/20/2010 1540   LABSPEC >=1.030 04/20/2010 1540   PHURINE 5.5 04/20/2010 1540   BILIRUBINUR NEGATIVE 04/20/2010 1540   KETONESUR TRACE 04/20/2010 1540   UROBILINOGEN 0.2 04/20/2010 1540   NITRITE NEGATIVE 04/20/2010 1540   LEUKOCYTESUR NEGATIVE 04/20/2010 1540    STUDIES: No results found.   ASSESSMENT: 79 y.o.  [place] man with a history of prostate cancer initially biopsied January 2008, Gleason 4+5, with a rapid doubling time, treated initially with Casodex and Lupron with an excellent PSA  response, status post radiation to 78 Gy given with curative intent completed October 2008.  Off Lupron as of December 2008. With the eventual development of right hip pain and a rapid PSA doubling time, he resumed Lupron and Casodex September 2011 with the Casodex stopped after a month.  He received  Lupron September 20 and July 25, 2010 with a very good response, then March 02, 2011, and April 2013, again with a significant drop in his PSA.    PLAN: Kurt Elliott is doing  well as far as his prostate cancer is concerned. He is tolerating the Lupron shots without any unusual side effects. We're going to go ahead and treat him today and we will continue to check his PSA and testosterone level every 6 weeks. He will see me in about 18 weeks and I expect around that time he may be needing another dose, depending on labs. Otherwise he knows to call for any problems that may develop before the next visit here.   Kurt Elliott C    01/09/2013

## 2013-01-10 ENCOUNTER — Encounter (INDEPENDENT_AMBULATORY_CARE_PROVIDER_SITE_OTHER): Payer: Medicare Other

## 2013-01-10 DIAGNOSIS — I6529 Occlusion and stenosis of unspecified carotid artery: Secondary | ICD-10-CM | POA: Diagnosis not present

## 2013-01-16 ENCOUNTER — Encounter: Payer: Self-pay | Admitting: Cardiology

## 2013-01-31 ENCOUNTER — Telehealth: Payer: Self-pay | Admitting: *Deleted

## 2013-01-31 NOTE — Telephone Encounter (Signed)
Pt came by to cancel his labs for 02/20/13 @ 10am and to r/s for 02/28/13@ 10:30am, because he will be out of town will return back on 02/26/13...td

## 2013-02-04 ENCOUNTER — Encounter: Payer: Self-pay | Admitting: Cardiology

## 2013-02-04 DIAGNOSIS — R9430 Abnormal result of cardiovascular function study, unspecified: Secondary | ICD-10-CM | POA: Insufficient documentation

## 2013-02-06 ENCOUNTER — Encounter: Payer: Self-pay | Admitting: Cardiology

## 2013-02-06 ENCOUNTER — Ambulatory Visit (INDEPENDENT_AMBULATORY_CARE_PROVIDER_SITE_OTHER): Payer: Medicare Other | Admitting: Cardiology

## 2013-02-06 ENCOUNTER — Other Ambulatory Visit: Payer: Self-pay

## 2013-02-06 VITALS — BP 124/62 | HR 58 | Ht 70.0 in | Wt 165.8 lb

## 2013-02-06 DIAGNOSIS — I779 Disorder of arteries and arterioles, unspecified: Secondary | ICD-10-CM | POA: Diagnosis not present

## 2013-02-06 DIAGNOSIS — I10 Essential (primary) hypertension: Secondary | ICD-10-CM

## 2013-02-06 DIAGNOSIS — I2510 Atherosclerotic heart disease of native coronary artery without angina pectoris: Secondary | ICD-10-CM

## 2013-02-06 MED ORDER — METHIMAZOLE 5 MG PO TABS: 5.0000 mg | ORAL_TABLET | ORAL | Status: DC

## 2013-02-06 MED ORDER — METOPROLOL TARTRATE 50 MG PO TABS: 50.0000 mg | ORAL_TABLET | Freq: Two times a day (BID) | ORAL | Status: DC

## 2013-02-06 MED ORDER — OLMESARTAN MEDOXOMIL 5 MG PO TABS: 10.0000 mg | ORAL_TABLET | Freq: Every day | ORAL | Status: DC

## 2013-02-06 NOTE — Assessment & Plan Note (Signed)
Blood pressure is controlled. No change in therapy. 

## 2013-02-06 NOTE — Patient Instructions (Addendum)
**Note De-identified  Obfuscation** Your physician recommends that you continue on your current medications as directed. Please refer to the Current Medication list given to you today.  Your physician wants you to follow-up in: 1 year. You will receive a reminder letter in the mail two months in advance. If you don't receive a letter, please call our office to schedule the follow-up appointment.  

## 2013-02-06 NOTE — Assessment & Plan Note (Signed)
Patient has had PCI's in the past. His last cath was May, 2008. Medical therapy was recommended then. He is not having any significant symptoms. I feel we do not need to proceed with any testing.

## 2013-02-06 NOTE — Progress Notes (Signed)
HPI  Patient is seen today to followup coronary disease. He continues to do well. He swims regularly. He's not having chest pain or shortness of breath.  Allergies  Allergen Reactions  . Lisinopril     REACTION: COUGH  . Ticlopidine Hcl     REACTION: rash    Current Outpatient Prescriptions  Medication Sig Dispense Refill  . aspirin 81 MG tablet Take 81 mg by mouth daily.        . Fluticasone-Salmeterol (ADVAIR DISKUS) 100-50 MCG/DOSE AEPB Inhale 1 puff into the lungs every 12 (twelve) hours. As needed  60 each  11  . ipratropium (ATROVENT) 0.06 % nasal spray Place 2 sprays into the nose as needed.       Marland Kitchen leuprolide (LUPRON) 3.75 MG injection Given by Dr Leodis Liverpool -- not sure of dosage      . meclizine (ANTIVERT) 25 MG tablet TAKE 1 TABLET (25 MG TOTAL) BY MOUTH EVERY 8 (EIGHT) HOURS AS NEEDED FOR DIZZINESS.  30 tablet  1  . metoprolol (LOPRESSOR) 50 MG tablet Take 1 tablet (50 mg total) by mouth 2 (two) times daily.  60 tablet  0  . MICRONIZED COLESTIPOL HCL 1 G tablet Take 5 g by mouth daily.       Marland Kitchen MICRONIZED COLESTIPOL HCL 1 G tablet TAKE 5 TABLETS BY MOUTH EVERY DAY  450 tablet  3  . Misc. Devices (QUAD CANE) MISC 1 Device by Does not apply route once.  1 each  0  . nitroGLYCERIN (NITROSTAT) 0.4 MG SL tablet Place 1 tablet (0.4 mg total) under the tongue every 5 (five) minutes as needed.  50 tablet  0  . olmesartan (BENICAR) 5 MG tablet Take 2 tablets (10 mg total) by mouth daily.  180 tablet  2  . pantoprazole (PROTONIX) 40 MG tablet TAKE 1 TABLET BY MOUTH EVERY DAY  90 tablet  3  . pantoprazole (PROTONIX) 40 MG tablet TAKE 1 TABLET (40 MG TOTAL) BY MOUTH DAILY.  90 tablet  3  . zolpidem (AMBIEN) 5 MG tablet Take 1 tablet (5 mg total) by mouth at bedtime as needed for sleep.  30 tablet  0  . methimazole (TAPAZOLE) 5 MG tablet Take 1 tablet (5 mg total) by mouth 3 (three) times a week.  40 tablet  1   No current facility-administered medications for this visit.    History     Social History  . Marital Status: Married    Spouse Name: N/A    Number of Children: 4  . Years of Education: N/A   Occupational History  . 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 Smoking status: Never Smoker   . Smokeless tobacco: Not on file  . Alcohol Use: No  . Drug Use: No  . Sexually Active: Not on file   Other Topics Concern  . Not on file   Social History Narrative  . No narrative on file    Family History  Problem Relation Age of Onset  . Breast cancer Sister     2 sisters  . Pemphigus vulgaris Mother     died at 66    Past Medical History  Diagnosis Date  . CAD (coronary artery disease)     PCI in the past / last catheterization May, 2008, medical therapy  . Hypertension   . Hyperlipemia   . Unspecified disorder of liver   . Cellulitis, leg     left  . Pulmonary nodule, right  2008 / unchanged CT scan, July, 2009  . Abdominal pain, left lower quadrant   . Adenocarcinoma of prostate   . Esophageal stricture   . Spinal stenosis   . Hyperglycemia   . Hyperthyroidism   . GERD (gastroesophageal reflux disease)   . Hx of colonic polyps   . Renal artery stenosis     Mild  . Atheroma     no penetrating ulcer, Abdominal aorta  . Inferior mesenteric artery injury     High-grade stenosis, CT scan, 2006  . Vertigo   . Diverticulum   . Incisional hernia   . Rash     Ticlid  . Cough     ACE Inhibitor, tolerates ARB  . Carotid artery disease     Doppler, 2006, minimal disease  //  Doppler, May, 2012, mild increase velocities, 40-59% bilateral stenoses  . Renal cyst     Hyperdense cyst lower pole left kidney  . Ejection fraction     EF 65%, catheterization, 2008    Past Surgical History  Procedure Laterality Date  . Esophagogastroduodenoscopy    . Cardiac catheterization    . Incisional hernia repair      Patient Active Problem List   Diagnosis Date Noted  . Ejection fraction   . Carotid artery disease   . CAD  (coronary artery disease)   . Pulmonary nodule, right   . Renal artery stenosis   . Rash   . Cough   . Renal cyst   . Inferior mesenteric artery injury   . ACUTE BRONCHITIS 06/29/2010  . VERTIGO 11/27/2009  . CELLULITIS, LEG, LEFT 12/30/2008  . UNSPECIFIED DISORDER OF LIVER 04/04/2008  . ABDOMINAL PAIN, LEFT LOWER QUADRANT 08/18/2007  . ADENOCARCINOMA, PROSTATE 06/15/2007  . HYPERTHYROIDISM 06/13/2007  . HYPERLIPIDEMIA 06/13/2007  . HYPERTENSION 06/13/2007  . ESOPHAGEAL STRICTURE 06/13/2007  . GERD 06/13/2007  . SPINAL STENOSIS 06/13/2007  . HYPERGLYCEMIA 06/13/2007  . COLONIC POLYPS, HX OF 06/13/2007    ROS   Patient denies fever, chills, headache, sweats, rash, change in vision, change in hearing, chest pain, cough, nausea vomiting, urinary symptoms. All other systems are reviewed and are negative.  PHYSICAL EXAM  Patient is oriented to person time and place. Affect is normal. There is no jugulovenous distention. Lungs are clear. Respiratory effort is nonlabored. Cardiac exam reveals S1-S2. There no clicks or significant murmurs. The abdomen is soft. There's no peripheral edema.  Filed Vitals:   02/06/13 1401  BP: 124/62  Pulse: 58  Height: 5\' 10"  (1.778 m)  Weight: 165 lb 12.8 oz (75.206 kg)   EKG is done today and reviewed by me. There is normal sinus rhythm. No significant change.  ASSESSMENT & PLAN

## 2013-02-06 NOTE — Assessment & Plan Note (Signed)
His carotids are followed very carefully by Doppler. This was stable in June, 2014.

## 2013-02-20 ENCOUNTER — Other Ambulatory Visit: Payer: Medicare Other | Admitting: Lab

## 2013-02-27 ENCOUNTER — Other Ambulatory Visit: Payer: Self-pay | Admitting: *Deleted

## 2013-02-27 DIAGNOSIS — C61 Malignant neoplasm of prostate: Secondary | ICD-10-CM

## 2013-02-28 ENCOUNTER — Other Ambulatory Visit (HOSPITAL_BASED_OUTPATIENT_CLINIC_OR_DEPARTMENT_OTHER): Payer: Medicare Other | Admitting: Lab

## 2013-02-28 DIAGNOSIS — C61 Malignant neoplasm of prostate: Secondary | ICD-10-CM

## 2013-02-28 LAB — CBC WITH DIFFERENTIAL/PLATELET
BASO%: 0.3 % (ref 0.0–2.0)
Basophils Absolute: 0 10*3/uL (ref 0.0–0.1)
HCT: 34.5 % — ABNORMAL LOW (ref 38.4–49.9)
HGB: 12.3 g/dL — ABNORMAL LOW (ref 13.0–17.1)
LYMPH%: 23.9 % (ref 14.0–49.0)
MCH: 33.6 pg — ABNORMAL HIGH (ref 27.2–33.4)
MCHC: 35.5 g/dL (ref 32.0–36.0)
MONO#: 0.3 10*3/uL (ref 0.1–0.9)
NEUT%: 66.3 % (ref 39.0–75.0)
Platelets: 167 10*3/uL (ref 140–400)
WBC: 5.4 10*3/uL (ref 4.0–10.3)
lymph#: 1.3 10*3/uL (ref 0.9–3.3)

## 2013-02-28 LAB — COMPREHENSIVE METABOLIC PANEL (CC13)
ALT: 37 U/L (ref 0–55)
BUN: 22 mg/dL (ref 7.0–26.0)
CO2: 22 mEq/L (ref 22–29)
Calcium: 10 mg/dL (ref 8.4–10.4)
Chloride: 109 mEq/L (ref 98–109)
Creatinine: 1.3 mg/dL (ref 0.7–1.3)
Glucose: 147 mg/dl — ABNORMAL HIGH (ref 70–140)

## 2013-02-28 LAB — PSA: PSA: 0.07 ng/mL (ref <=4.00)

## 2013-03-01 ENCOUNTER — Other Ambulatory Visit: Payer: Self-pay | Admitting: Oncology

## 2013-03-05 ENCOUNTER — Telehealth: Payer: Self-pay

## 2013-03-05 MED ORDER — ONDANSETRON HCL 4 MG PO TABS: 4.0000 mg | ORAL_TABLET | Freq: Three times a day (TID) | ORAL | Status: DC | PRN

## 2013-03-05 MED ORDER — CIPROFLOXACIN HCL 500 MG PO TABS: 500.0000 mg | ORAL_TABLET | Freq: Two times a day (BID) | ORAL | Status: DC

## 2013-03-05 NOTE — Telephone Encounter (Signed)
Pt's wife left voicemail stating, that pt needs rx for cipro and cough med going on a 2 week trip please advise [phone].

## 2013-03-05 NOTE — Telephone Encounter (Signed)
sent 

## 2013-03-05 NOTE — Telephone Encounter (Signed)
Left voicemail rx ready

## 2013-03-15 ENCOUNTER — Other Ambulatory Visit: Payer: Self-pay | Admitting: Gastroenterology

## 2013-04-03 ENCOUNTER — Other Ambulatory Visit (HOSPITAL_BASED_OUTPATIENT_CLINIC_OR_DEPARTMENT_OTHER): Payer: Medicare Other

## 2013-04-03 ENCOUNTER — Other Ambulatory Visit: Payer: Self-pay | Admitting: *Deleted

## 2013-04-03 DIAGNOSIS — C61 Malignant neoplasm of prostate: Secondary | ICD-10-CM

## 2013-04-03 DIAGNOSIS — K769 Liver disease, unspecified: Secondary | ICD-10-CM

## 2013-04-03 DIAGNOSIS — I2510 Atherosclerotic heart disease of native coronary artery without angina pectoris: Secondary | ICD-10-CM

## 2013-04-03 DIAGNOSIS — R7309 Other abnormal glucose: Secondary | ICD-10-CM

## 2013-04-03 LAB — CBC WITH DIFFERENTIAL/PLATELET
EOS%: 4.4 % (ref 0.0–7.0)
Eosinophils Absolute: 0.3 10*3/uL (ref 0.0–0.5)
MCV: 94.3 fL (ref 79.3–98.0)
MONO%: 5.1 % (ref 0.0–14.0)
NEUT#: 4.5 10*3/uL (ref 1.5–6.5)
RBC: 3.77 10*6/uL — ABNORMAL LOW (ref 4.20–5.82)
RDW: 12.6 % (ref 11.0–14.6)

## 2013-04-03 LAB — COMPREHENSIVE METABOLIC PANEL (CC13)
AST: 52 U/L — ABNORMAL HIGH (ref 5–34)
Albumin: 3.7 g/dL (ref 3.5–5.0)
BUN: 21.2 mg/dL (ref 7.0–26.0)
CO2: 24 mEq/L (ref 22–29)
Calcium: 10.1 mg/dL (ref 8.4–10.4)
Chloride: 106 mEq/L (ref 98–109)
Creatinine: 1.1 mg/dL (ref 0.7–1.3)
Glucose: 153 mg/dl — ABNORMAL HIGH (ref 70–140)
Potassium: 4.3 mEq/L (ref 3.5–5.1)

## 2013-04-04 LAB — TESTOSTERONE: Testosterone: 46 ng/dL — ABNORMAL LOW (ref 300–890)

## 2013-04-04 LAB — PSA: PSA: 0.06 ng/mL (ref <=4.00)

## 2013-04-05 ENCOUNTER — Other Ambulatory Visit: Payer: Self-pay | Admitting: Endocrinology

## 2013-04-05 MED ORDER — ZOLPIDEM TARTRATE 5 MG PO TABS: 5.0000 mg | ORAL_TABLET | Freq: Every evening | ORAL | Status: DC | PRN

## 2013-05-14 ENCOUNTER — Other Ambulatory Visit: Payer: Self-pay | Admitting: Physician Assistant

## 2013-05-14 DIAGNOSIS — C61 Malignant neoplasm of prostate: Secondary | ICD-10-CM

## 2013-05-15 ENCOUNTER — Other Ambulatory Visit (HOSPITAL_BASED_OUTPATIENT_CLINIC_OR_DEPARTMENT_OTHER): Payer: Medicare Other | Admitting: Lab

## 2013-05-15 DIAGNOSIS — C61 Malignant neoplasm of prostate: Secondary | ICD-10-CM | POA: Diagnosis not present

## 2013-05-15 LAB — COMPREHENSIVE METABOLIC PANEL (CC13)
Albumin: 3.6 g/dL (ref 3.5–5.0)
Alkaline Phosphatase: 97 U/L (ref 40–150)
Anion Gap: 10 mEq/L (ref 3–11)
BUN: 19.5 mg/dL (ref 7.0–26.0)
CO2: 20 mEq/L — ABNORMAL LOW (ref 22–29)
Calcium: 9.9 mg/dL (ref 8.4–10.4)
Glucose: 137 mg/dl (ref 70–140)
Potassium: 4.4 mEq/L (ref 3.5–5.1)
Sodium: 139 mEq/L (ref 136–145)
Total Protein: 7.3 g/dL (ref 6.4–8.3)

## 2013-05-15 LAB — TESTOSTERONE: Testosterone: 31 ng/dL — ABNORMAL LOW (ref 300–890)

## 2013-05-15 LAB — CBC WITH DIFFERENTIAL/PLATELET
BASO%: 0.3 % (ref 0.0–2.0)
Basophils Absolute: 0 10*3/uL (ref 0.0–0.1)
Eosinophils Absolute: 0.3 10*3/uL (ref 0.0–0.5)
HGB: 12.4 g/dL — ABNORMAL LOW (ref 13.0–17.1)
MCH: 33.5 pg — ABNORMAL HIGH (ref 27.2–33.4)
MCHC: 35.6 g/dL (ref 32.0–36.0)
MCV: 94.2 fL (ref 79.3–98.0)
MONO#: 0.4 10*3/uL (ref 0.1–0.9)
MONO%: 6.3 % (ref 0.0–14.0)
NEUT#: 4.2 10*3/uL (ref 1.5–6.5)
Platelets: 177 10*3/uL (ref 140–400)
RBC: 3.69 10*6/uL — ABNORMAL LOW (ref 4.20–5.82)
RDW: 12.4 % (ref 11.0–14.6)
WBC: 6.2 10*3/uL (ref 4.0–10.3)

## 2013-05-15 LAB — PSA: PSA: 0.06 ng/mL (ref <=4.00)

## 2013-05-28 ENCOUNTER — Telehealth: Payer: Self-pay | Admitting: Cardiology

## 2013-05-28 NOTE — Telephone Encounter (Signed)
Patient's wife called and states she has talked with her son-in-law, Dr. Melvia Heaps and that they would like to make patient an appointment for tomorrow to see Dr. Myrtis Ser.  After consultation with Lorne Skeens, [facility] I advised patient's wife of appointment for 12:30. Patient's wife thanked me for my help.

## 2013-05-28 NOTE — Telephone Encounter (Signed)
Spoke with patient's daughter who states patient is grayish in color and feels weak and lethargic over past couple of days.  Daughter states patient never complains; states patient does not have a good appetite but that he is eating and drinking some, especially water and tea.  Patient's daughter states her brother recently died and patient may be responding to that - states it is very unusual for him to complain so she knows that something is wrong because the last time he complained he had a heart attack.  Daughter would like patient to be seen by Dr. Myrtis Ser today.  I advised that Dr. Myrtis Ser is not in the office today.  I advised that patient should go to the hospital; Daughter states the patient refuses to go to the hospital and wants to be seen here.  I advised that I will review with Dr. Shirlee Latch, DOD.  Daughter states she would like [place] to talk to her husband, Dr. Elwin Sleight GI.

## 2013-05-28 NOTE — Telephone Encounter (Signed)
New problem    Pt's daughter Dione Housekeeper called to say her dad pt is feeling very weak pt looks grayish brown and Dr [name] asked that he be seen today. Call pt or daughter.

## 2013-05-28 NOTE — Telephone Encounter (Signed)
Left message for call back.

## 2013-05-28 NOTE — Telephone Encounter (Signed)
I reviewed with Dr. Shirlee Latch, DOD who advised with patient's symptoms that he would be best served at the hospital.  I called Dr. [name] at [phone] per patient's daughter's request and discussed the patient with him.  Dr. [name] states he will call the patient; states he saw him last night and feels that unless something has changed since last night that patient can be scheduled for an appointment tomorrow with Dr. Myrtis Ser.  I advised Dr. [name] to please have patient's daughter call me back to schedule appointment.  Dr. [name] verbalized agreement

## 2013-05-28 NOTE — Telephone Encounter (Signed)
Follow Up ° °Pt returned call//  °

## 2013-05-29 ENCOUNTER — Ambulatory Visit (INDEPENDENT_AMBULATORY_CARE_PROVIDER_SITE_OTHER): Payer: Medicare Other | Admitting: Cardiology

## 2013-05-29 ENCOUNTER — Encounter: Payer: Self-pay | Admitting: Cardiology

## 2013-05-29 VITALS — BP 144/66 | HR 58 | Ht 70.0 in | Wt 163.0 lb

## 2013-05-29 DIAGNOSIS — R011 Cardiac murmur, unspecified: Secondary | ICD-10-CM

## 2013-05-29 DIAGNOSIS — I498 Other specified cardiac arrhythmias: Secondary | ICD-10-CM

## 2013-05-29 DIAGNOSIS — R55 Syncope and collapse: Secondary | ICD-10-CM

## 2013-05-29 DIAGNOSIS — I10 Essential (primary) hypertension: Secondary | ICD-10-CM

## 2013-05-29 DIAGNOSIS — R269 Unspecified abnormalities of gait and mobility: Secondary | ICD-10-CM

## 2013-05-29 DIAGNOSIS — I2510 Atherosclerotic heart disease of native coronary artery without angina pectoris: Secondary | ICD-10-CM

## 2013-05-29 DIAGNOSIS — R42 Dizziness and giddiness: Secondary | ICD-10-CM

## 2013-05-29 DIAGNOSIS — R001 Bradycardia, unspecified: Secondary | ICD-10-CM

## 2013-05-29 DIAGNOSIS — R2681 Unsteadiness on feet: Secondary | ICD-10-CM | POA: Insufficient documentation

## 2013-05-29 MED ORDER — METOPROLOL TARTRATE 25 MG PO TABS: 25.0000 mg | ORAL_TABLET | Freq: Two times a day (BID) | ORAL | Status: DC

## 2013-05-29 NOTE — Assessment & Plan Note (Signed)
He feels that his gait is becoming unsteady. He has not fallen. He moves slowly to be careful. At some point neurologic workup may be appropriate.

## 2013-05-29 NOTE — Assessment & Plan Note (Signed)
Dizziness and possible presyncope is the major complaint today. He does not appear to be orthostatic. He thinks it is not his vertigo. EKG shows mild sinus bradycardia. His beta blocker dose will be decreased. There is a soft systolic murmur. Echo will be done to be sure that he does not have more significant valvular disease that I can assess by physical exam.  As part of today's evaluation I spent greater than 25 minutes for this total care. More than half of this time was spent with direct contact with the patient discussing all these issues. We will obtain an echo and lower his beta blocker dose. I will then see him back for followup.

## 2013-05-29 NOTE — Patient Instructions (Signed)
Your physician has requested that you have an echocardiogram. Echocardiography is a painless test that uses sound waves to create images of your heart. It provides your doctor with information about the size and shape of your heart and how well your heart's chambers and valves are working. This procedure takes approximately one hour. There are no restrictions for this procedure.  Your physician has recommended you make the following change in your medication: decrease Metoprolol to 25 mg twice daily  Your physician recommends that you schedule a follow-up appointment in:November 11 at 11:30

## 2013-05-29 NOTE — Telephone Encounter (Signed)
Pt called to move his arrival time on 06/14/13 form 10:30am to 12 noon...td

## 2013-05-29 NOTE — Telephone Encounter (Signed)
Follow Up:  Pt's daughter states she would like the doctor or nurse to call her and explain her father's plan of care. Please advise

## 2013-05-29 NOTE — Assessment & Plan Note (Signed)
There is mild sinus bradycardia. It is possible that more marked bradycardia could play a role with his symptoms of some dizziness and presyncope. I have cut his Lopressor dose down from 50 twice a day to 25 twice a day.

## 2013-05-29 NOTE — Progress Notes (Signed)
HPI   Patient is seen today as an add-on. He called stating that he was having some dizzy spells. There is a history of vertigo but he feels this is not the same. He has had 2 episodes of feeling that he might again to pass out. He has not fallen. Both of these episodes have occurred while standing. One occurred when he turned rapidly. He's not having chest pain or shortness of breath. He continues to do his swimming although his distance has decreased over time.  Allergies  Allergen Reactions  . Lisinopril     REACTION: COUGH  . Ticlopidine Hcl     REACTION: rash    Current Outpatient Prescriptions  Medication Sig Dispense Refill  . aspirin 81 MG tablet Take 81 mg by mouth daily.        . Fluticasone-Salmeterol (ADVAIR DISKUS) 100-50 MCG/DOSE AEPB Inhale 1 puff into the lungs every 12 (twelve) hours. As needed  60 each  11  . ipratropium (ATROVENT) 0.06 % nasal spray Place 2 sprays into the nose as needed.       Marland Kitchen leuprolide (LUPRON) 3.75 MG injection Given by Dr Leodis Liverpool -- not sure of dosage      . meclizine (ANTIVERT) 25 MG tablet TAKE 1 TABLET (25 MG TOTAL) BY MOUTH EVERY 8 (EIGHT) HOURS AS NEEDED FOR DIZZINESS.  30 tablet  1  . methimazole (TAPAZOLE) 5 MG tablet Take 1 tablet (5 mg total) by mouth 3 (three) times a week.  40 tablet  1  . metoprolol (LOPRESSOR) 25 MG tablet Take 1 tablet (25 mg total) by mouth 2 (two) times daily.  180 tablet  1  . MICRONIZED COLESTIPOL HCL 1 G tablet TAKE 5 TABLETS BY MOUTH EVERY DAY  450 tablet  3  . Misc. Devices (QUAD CANE) MISC 1 Device by Does not apply route once.  1 each  0  . nitroGLYCERIN (NITROSTAT) 0.4 MG SL tablet Place 1 tablet (0.4 mg total) under the tongue every 5 (five) minutes as needed.  50 tablet  0  . olmesartan (BENICAR) 5 MG tablet Take 2 tablets (10 mg total) by mouth daily.  180 tablet  3  . ondansetron (ZOFRAN) 4 MG tablet Take 1 tablet (4 mg total) by mouth every 8 (eight) hours as needed for nausea.  20 tablet  0  .  pantoprazole (PROTONIX) 40 MG tablet TAKE 1 TABLET (40 MG TOTAL) BY MOUTH DAILY.  90 tablet  3  . zolpidem (AMBIEN) 5 MG tablet Take 1 tablet (5 mg total) by mouth at bedtime as needed for sleep.  30 tablet  3   No current facility-administered medications for this visit.    History   Social History  . Marital Status: Married    Spouse Name: N/A    Number of Children: 4  . Years of Education: N/A   Occupational History  . 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 Smoking status: Never Smoker   . Smokeless tobacco: Not on file  . Alcohol Use: No  . Drug Use: No  . Sexual Activity: Not on file   Other Topics Concern  . Not on file   Social History Narrative  . No narrative on file    Family History  Problem Relation Age of Onset  . Breast cancer Sister     2 sisters  . Pemphigus vulgaris Mother     died at 79    Past Medical History  Diagnosis  Date  . CAD (coronary artery disease)     PCI in the past / last catheterization May, 2008, medical therapy  . Hypertension   . Hyperlipemia   . Unspecified disorder of liver   . Cellulitis, leg     left  . Pulmonary nodule, right     2008 / unchanged CT scan, July, 2009  . Abdominal pain, left lower quadrant   . Adenocarcinoma of prostate   . Esophageal stricture   . Spinal stenosis   . Hyperglycemia   . Hyperthyroidism   . GERD (gastroesophageal reflux disease)   . Hx of colonic polyps   . Renal artery stenosis     Mild  . Atheroma     no penetrating ulcer, Abdominal aorta  . Inferior mesenteric artery injury     High-grade stenosis, CT scan, 2006  . Vertigo   . Diverticulum   . Incisional hernia   . Rash     Ticlid  . Cough     ACE Inhibitor, tolerates ARB  . Carotid artery disease     Doppler, 2006, minimal disease  //  Doppler, May, 2012, mild increase velocities, 40-59% bilateral stenoses  . Renal cyst     Hyperdense cyst lower pole left kidney  . Ejection fraction     EF 65%,  catheterization, 2008  . Dizziness   . Unsteady gait     Past Surgical History  Procedure Laterality Date  . Esophagogastroduodenoscopy    . Cardiac catheterization    . Incisional hernia repair      Patient Active Problem List   Diagnosis Date Noted  . Dizziness   . Unsteady gait   . Ejection fraction   . Carotid artery disease   . CAD (coronary artery disease)   . Pulmonary nodule, right   . Renal artery stenosis   . Rash   . Cough   . Renal cyst   . Inferior mesenteric artery injury   . ACUTE BRONCHITIS 06/29/2010  . VERTIGO 11/27/2009  . CELLULITIS, LEG, LEFT 12/30/2008  . UNSPECIFIED DISORDER OF LIVER 04/04/2008  . ABDOMINAL PAIN, LEFT LOWER QUADRANT 08/18/2007  . ADENOCARCINOMA, PROSTATE 06/15/2007  . HYPERTHYROIDISM 06/13/2007  . HYPERLIPIDEMIA 06/13/2007  . HYPERTENSION 06/13/2007  . ESOPHAGEAL STRICTURE 06/13/2007  . GERD 06/13/2007  . SPINAL STENOSIS 06/13/2007  . HYPERGLYCEMIA 06/13/2007  . COLONIC POLYPS, HX OF 06/13/2007    ROS   Patient denies fever, chills, headache, sweats, rash, change in vision, change in hearing, chest pain, cough, nausea vomiting, urinary symptoms. All other systems are reviewed and are negative.  PHYSICAL EXAM  This very pleasant, intelligent, 79 year old gentleman is stable. He is oriented to person time and place. Recently one of his sons died of a hematologic disorder. He is grieving appropriately, but I do not think this is causing his symptoms. There is no jugular venous distention. Lungs are clear. Respiratory effort is nonlabored. Cardiac exam reveals S1 and S2. There is a 2/6 systolic murmur. The abdomen is soft. There is no peripheral edema. There is no skin rash. He does have kyphosis of the thoracic spine. Complete orthostatic blood pressure check was done. There was no drop in blood pressure when sitting or standing. His heart rate remained 55-59.  Filed Vitals:   05/29/13 1153 05/29/13 1155 05/29/13 1157 05/29/13 1159    BP: 136/68 142/65 143/67 144/66  Pulse: 58 61 59 58  Height:      Weight:      SpO2:  EKG is done today and reviewed by me. He has sinus bradycardia with a rate of 55. There is no change in the QRS when compared to prior tracing.  ASSESSMENT & PLAN

## 2013-05-29 NOTE — Telephone Encounter (Signed)
Cannot reach the pt or the pts daughter, Dione Housekeeper, at any of the numbers provided. LMTCB.

## 2013-05-29 NOTE — Assessment & Plan Note (Signed)
The patient has vertigo by history. He thinks that his current symptoms are not vertigo.

## 2013-05-29 NOTE — Assessment & Plan Note (Signed)
Blood pressure is adequately controlled. No change in therapy. 

## 2013-05-29 NOTE — Addendum Note (Signed)
Addended by: Demetrios Loll on: 05/29/2013 02:07 PM   Modules accepted: Orders

## 2013-05-30 ENCOUNTER — Ambulatory Visit (HOSPITAL_COMMUNITY): Payer: Medicare Other | Attending: Endocrinology | Admitting: Radiology

## 2013-05-30 ENCOUNTER — Telehealth: Payer: Self-pay | Admitting: Cardiology

## 2013-05-30 DIAGNOSIS — I2510 Atherosclerotic heart disease of native coronary artery without angina pectoris: Secondary | ICD-10-CM | POA: Insufficient documentation

## 2013-05-30 DIAGNOSIS — I10 Essential (primary) hypertension: Secondary | ICD-10-CM | POA: Insufficient documentation

## 2013-05-30 DIAGNOSIS — I079 Rheumatic tricuspid valve disease, unspecified: Secondary | ICD-10-CM | POA: Diagnosis not present

## 2013-05-30 DIAGNOSIS — E785 Hyperlipidemia, unspecified: Secondary | ICD-10-CM | POA: Insufficient documentation

## 2013-05-30 DIAGNOSIS — R011 Cardiac murmur, unspecified: Secondary | ICD-10-CM | POA: Diagnosis not present

## 2013-05-30 DIAGNOSIS — R55 Syncope and collapse: Secondary | ICD-10-CM | POA: Insufficient documentation

## 2013-05-30 NOTE — Progress Notes (Signed)
Echocardiogram performed.  

## 2013-05-30 NOTE — Telephone Encounter (Signed)
Pt states that he will be here at 11:30 for his appt with Dr. Myrtis Ser on 11/14.

## 2013-05-30 NOTE — Telephone Encounter (Signed)
Follow Up  ° °Pt returned your call  °

## 2013-05-30 NOTE — Telephone Encounter (Signed)
**Note De-Identified  Obfuscation** Pt states that he will be here on 11/14 at 11:30 for his appt with Dr Myrtis Ser.

## 2013-05-31 ENCOUNTER — Other Ambulatory Visit: Payer: Self-pay

## 2013-05-31 ENCOUNTER — Encounter: Payer: Self-pay | Admitting: Cardiology

## 2013-06-01 ENCOUNTER — Other Ambulatory Visit: Payer: Self-pay | Admitting: *Deleted

## 2013-06-01 MED ORDER — COLESTIPOL HCL 1 G PO TABS: 1.0000 g | ORAL_TABLET | Freq: Every day | ORAL | Status: DC

## 2013-06-08 ENCOUNTER — Encounter: Payer: Self-pay | Admitting: Cardiology

## 2013-06-08 ENCOUNTER — Ambulatory Visit (INDEPENDENT_AMBULATORY_CARE_PROVIDER_SITE_OTHER): Payer: Medicare Other | Admitting: Cardiology

## 2013-06-08 VITALS — BP 122/58 | HR 62 | Ht 70.0 in | Wt 163.0 lb

## 2013-06-08 DIAGNOSIS — I2510 Atherosclerotic heart disease of native coronary artery without angina pectoris: Secondary | ICD-10-CM | POA: Diagnosis not present

## 2013-06-08 DIAGNOSIS — R42 Dizziness and giddiness: Secondary | ICD-10-CM

## 2013-06-08 DIAGNOSIS — I498 Other specified cardiac arrhythmias: Secondary | ICD-10-CM

## 2013-06-08 DIAGNOSIS — R001 Bradycardia, unspecified: Secondary | ICD-10-CM

## 2013-06-08 NOTE — Patient Instructions (Signed)
Your physician recommends that you continue on your current medications as directed. Please refer to the Current Medication list given to you today.  Your physician recommends that you schedule a follow-up appointment in: 3 months  

## 2013-06-08 NOTE — Progress Notes (Signed)
HPI  Patient is seen today to followup recent dizzy spells. I saw him in the office May 29, 2013. We arrange for followup 2-D echo. His ejection fraction remains normal at 65-70%. There were no significant valvular abnormalities. He had mild bradycardia while in the office. I decided to decrease his metoprolol dose. He has felt very well since that time. He is stable and has not had any recurring episodes since I saw him.  Allergies  Allergen Reactions  . Lisinopril     REACTION: COUGH  . Ticlopidine Hcl     REACTION: rash    Current Outpatient Prescriptions  Medication Sig Dispense Refill  . aspirin 81 MG tablet Take 81 mg by mouth daily.        . colestipol (MICRONIZED COLESTIPOL HCL) 1 G tablet Take 1 tablet (1 g total) by mouth daily.  450 tablet  3  . Fluticasone-Salmeterol (ADVAIR DISKUS) 100-50 MCG/DOSE AEPB Inhale 1 puff into the lungs every 12 (twelve) hours. As needed  60 each  11  . ipratropium (ATROVENT) 0.06 % nasal spray Place 2 sprays into the nose as needed.       Marland Kitchen leuprolide (LUPRON) 3.75 MG injection Given by Dr Leodis Liverpool -- not sure of dosage      . meclizine (ANTIVERT) 25 MG tablet TAKE 1 TABLET (25 MG TOTAL) BY MOUTH EVERY 8 (EIGHT) HOURS AS NEEDED FOR DIZZINESS.  30 tablet  1  . methimazole (TAPAZOLE) 5 MG tablet Take 1 tablet (5 mg total) by mouth 3 (three) times a week.  40 tablet  1  . metoprolol (LOPRESSOR) 25 MG tablet Take 1 tablet (25 mg total) by mouth 2 (two) times daily.  180 tablet  1  . Misc. Devices (QUAD CANE) MISC 1 Device by Does not apply route once.  1 each  0  . nitroGLYCERIN (NITROSTAT) 0.4 MG SL tablet Place 1 tablet (0.4 mg total) under the tongue every 5 (five) minutes as needed.  50 tablet  0  . olmesartan (BENICAR) 5 MG tablet Take 2 tablets (10 mg total) by mouth daily.  180 tablet  3  . ondansetron (ZOFRAN) 4 MG tablet Take 1 tablet (4 mg total) by mouth every 8 (eight) hours as needed for nausea.  20 tablet  0  . pantoprazole  (PROTONIX) 40 MG tablet TAKE 1 TABLET (40 MG TOTAL) BY MOUTH DAILY.  90 tablet  3  . zolpidem (AMBIEN) 5 MG tablet Take 1 tablet (5 mg total) by mouth at bedtime as needed for sleep.  30 tablet  3   No current facility-administered medications for this visit.    History   Social History  . Marital Status: Married    Spouse Name: N/A    Number of Children: 4  . Years of Education: N/A   Occupational History  . 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 Smoking status: Never Smoker   . Smokeless tobacco: Not on file  . Alcohol Use: No  . Drug Use: No  . Sexual Activity: Not on file   Other Topics Concern  . Not on file   Social History Narrative  . No narrative on file    Family History  Problem Relation Age of Onset  . Breast cancer Sister     2 sisters  . Pemphigus vulgaris Mother     died at 52    Past Medical History  Diagnosis Date  . CAD (coronary artery disease)  PCI in the past / last catheterization May, 2008, medical therapy  . Hypertension   . Hyperlipemia   . Unspecified disorder of liver   . Cellulitis, leg     left  . Pulmonary nodule, right     2008 / unchanged CT scan, July, 2009  . Abdominal pain, left lower quadrant   . Adenocarcinoma of prostate   . Esophageal stricture   . Spinal stenosis   . Hyperglycemia   . Hyperthyroidism   . GERD (gastroesophageal reflux disease)   . Hx of colonic polyps   . Renal artery stenosis     Mild  . Atheroma     no penetrating ulcer, Abdominal aorta  . Inferior mesenteric artery injury     High-grade stenosis, CT scan, 2006  . Vertigo   . Diverticulum   . Incisional hernia   . Rash     Ticlid  . Cough     ACE Inhibitor, tolerates ARB  . Carotid artery disease     Doppler, 2006, minimal disease  //  Doppler, May, 2012, mild increase velocities, 40-59% bilateral stenoses  . Renal cyst     Hyperdense cyst lower pole left kidney  . Ejection fraction     EF 65%, catheterization,  2008  . Dizziness   . Unsteady gait   . Sinus bradycardia     Past Surgical History  Procedure Laterality Date  . Esophagogastroduodenoscopy    . Cardiac catheterization    . Incisional hernia repair      Patient Active Problem List   Diagnosis Date Noted  . Dizziness   . Unsteady gait   . Sinus bradycardia   . Ejection fraction   . Carotid artery disease   . CAD (coronary artery disease)   . Pulmonary nodule, right   . Renal artery stenosis   . Rash   . Cough   . Renal cyst   . Inferior mesenteric artery injury   . ACUTE BRONCHITIS 06/29/2010  . VERTIGO 11/27/2009  . CELLULITIS, LEG, LEFT 12/30/2008  . UNSPECIFIED DISORDER OF LIVER 04/04/2008  . ABDOMINAL PAIN, LEFT LOWER QUADRANT 08/18/2007  . ADENOCARCINOMA, PROSTATE 06/15/2007  . HYPERTHYROIDISM 06/13/2007  . HYPERLIPIDEMIA 06/13/2007  . HYPERTENSION 06/13/2007  . ESOPHAGEAL STRICTURE 06/13/2007  . GERD 06/13/2007  . SPINAL STENOSIS 06/13/2007  . HYPERGLYCEMIA 06/13/2007  . COLONIC POLYPS, HX OF 06/13/2007    ROS   Patient denies fever, chills, headache, sweats, rash, change in vision, change in hearing, chest pain, cough, nausea vomiting, urinary symptoms. All other systems are reviewed and are negative.  PHYSICAL EXAM   Patient is stable. He's here with his wife. He is oriented to person time and place. Affect is normal. Lungs are clear. Respiratory effort is nonlabored. Cardiac exam reveals S1 and S2. There is a soft systolic murmur. The abdomen is soft. There is no peripheral edema. Filed Vitals:   06/08/13 1109  BP: 122/58  Pulse: 62  Height: 5\' 10"  (1.778 m)  Weight: 163 lb (73.936 kg)  SpO2: 98%     ASSESSMENT & PLAN

## 2013-06-08 NOTE — Assessment & Plan Note (Signed)
At this point his recent dizziness and unsteady gait appeared to be improved. I have lowered his beta blocker dose. With the lower dose his heart rate remains 62. He definitely feels better. No further changes at this time.

## 2013-06-08 NOTE — Assessment & Plan Note (Signed)
His rate is stable on the newly adjusted dose of metoprolol. No change in therapy.

## 2013-06-08 NOTE — Assessment & Plan Note (Signed)
Coronary disease is stable. Followup 2-D echo revealed continued normal left ventricular function. No further workup.

## 2013-06-12 ENCOUNTER — Ambulatory Visit: Payer: Medicare Other | Admitting: Cardiology

## 2013-06-12 ENCOUNTER — Other Ambulatory Visit (HOSPITAL_BASED_OUTPATIENT_CLINIC_OR_DEPARTMENT_OTHER): Payer: Medicare Other | Admitting: Lab

## 2013-06-12 DIAGNOSIS — C61 Malignant neoplasm of prostate: Secondary | ICD-10-CM

## 2013-06-12 LAB — COMPREHENSIVE METABOLIC PANEL (CC13)
ALT: 34 U/L (ref 0–55)
AST: 47 U/L — ABNORMAL HIGH (ref 5–34)
Albumin: 3.7 g/dL (ref 3.5–5.0)
Alkaline Phosphatase: 104 U/L (ref 40–150)
BUN: 23.1 mg/dL (ref 7.0–26.0)
Calcium: 9.9 mg/dL (ref 8.4–10.4)
Chloride: 107 mEq/L (ref 98–109)
Creatinine: 1.2 mg/dL (ref 0.7–1.3)
Glucose: 146 mg/dl — ABNORMAL HIGH (ref 70–140)
Potassium: 4.5 mEq/L (ref 3.5–5.1)
Sodium: 141 mEq/L (ref 136–145)
Total Bilirubin: 0.64 mg/dL (ref 0.20–1.20)
Total Protein: 7.2 g/dL (ref 6.4–8.3)

## 2013-06-12 LAB — CBC WITH DIFFERENTIAL/PLATELET
BASO%: 0.2 % (ref 0.0–2.0)
Basophils Absolute: 0 10*3/uL (ref 0.0–0.1)
Eosinophils Absolute: 0.3 10*3/uL (ref 0.0–0.5)
HGB: 12.1 g/dL — ABNORMAL LOW (ref 13.0–17.1)
LYMPH%: 20.3 % (ref 14.0–49.0)
MONO%: 5.1 % (ref 0.0–14.0)
NEUT#: 4.1 10*3/uL (ref 1.5–6.5)
RBC: 3.61 10*6/uL — ABNORMAL LOW (ref 4.20–5.82)
RDW: 12.7 % (ref 11.0–14.6)
WBC: 5.9 10*3/uL (ref 4.0–10.3)
lymph#: 1.2 10*3/uL (ref 0.9–3.3)

## 2013-06-12 LAB — PSA: PSA: 0.08 ng/mL (ref <=4.00)

## 2013-06-12 LAB — TESTOSTERONE: Testosterone: <10 ng/dL — ABNORMAL LOW (ref 300–890)

## 2013-06-14 ENCOUNTER — Telehealth: Payer: Self-pay | Admitting: *Deleted

## 2013-06-14 ENCOUNTER — Ambulatory Visit (HOSPITAL_BASED_OUTPATIENT_CLINIC_OR_DEPARTMENT_OTHER): Payer: Medicare Other | Admitting: Oncology

## 2013-06-14 ENCOUNTER — Ambulatory Visit: Payer: Medicare Other | Admitting: Oncology

## 2013-06-14 VITALS — BP 146/68 | HR 65 | Temp 96.8°F | Resp 18 | Ht 70.0 in | Wt 166.7 lb

## 2013-06-14 DIAGNOSIS — C61 Malignant neoplasm of prostate: Secondary | ICD-10-CM

## 2013-06-14 NOTE — Telephone Encounter (Signed)
appts made and printed...td 

## 2013-06-14 NOTE — Progress Notes (Signed)
ID: Kurt Elliott   DOB: 08/17/1920  MR#: [id number]  WUJ#:[id number]  PCP: Romero Belling, MD GYN: SU:  OTHER MD: Beverly Sessions (FAX [phone]); Marcine Matar; Janalyn Harder; Jerral Bonito; Melvia Heaps; Chipper Herb   HISTORY OF PRESENT ILLNESS: Kurt Elliott tells me his PSA and prostate had been followed for a very long time.  His PSA was approximately 15 about a year ago [2007], then about 6 months ago it went to 36, which means that it had just about doubled in 6 months.  Of course, that raised red flags and the patient had his prostate biopsied at [facility] under Dr. Colon Branch 08/02/2006.  This showed a Gleason 9 (4+5) adenocarcinoma in 5 of the 6 cores, the other core showing a Gleason 8 (3+5).  In short, an aggressive tumor.  The patient had a bone scan 08/10/2006, which was negative, and CTs of the abdomen and pelvis 08/15/2006, which showed an enlarged prostate at 5.7 x 5.1 cm, but no evidence of adenopathy or extracapsular spread; no obvious invasion of the rectum or the bladder; and negative bone windows.  There was incidental nodularity around the left perirenal fat of unclear significance.   The patient was started on Casodex for about a month before receiving his first Lupron shot 09/05/2006.  He had a second Lupron shot 09/09/2006.  His PSA responded well, and his subsequent history is as detailed below.  INTERVAL HISTORY: Kurt Elliott returns today with his wife Windell Moulding for followup of his prostate cancer. The interval history is for, in that he lost his older son, a well known rabbi in [place], to non-Hodgkin's lymphoma. Kurt Elliott and Windell Moulding were a bit too frail to make the trip up there but he tells me the funeral service was enormous   REVIEW OF SYSTEMS: He describes himself is moderately fatigued. He has a little bit of a runny nose, but no cough, shortness of breath, or wheezing. His dentures are but not perfect". His back hurts sometimes when he walks. He denies hot flashes. He has significant  chronic arthritis problems. These are not more intense or persistent than before .A detailed review of systems was otherwise stable  PAST MEDICAL HISTORY: Past Medical History  Diagnosis Date  . CAD (coronary artery disease)     PCI in the past / last catheterization May, 2008, medical therapy  . Hypertension   . Hyperlipemia   . Unspecified disorder of liver   . Cellulitis, leg     left  . Pulmonary nodule, right     2008 / unchanged CT scan, July, 2009  . Abdominal pain, left lower quadrant   . Adenocarcinoma of prostate   . Esophageal stricture   . Spinal stenosis   . Hyperglycemia   . Hyperthyroidism   . GERD (gastroesophageal reflux disease)   . Hx of colonic polyps   . Renal artery stenosis     Mild  . Atheroma     no penetrating ulcer, Abdominal aorta  . Inferior mesenteric artery injury     High-grade stenosis, CT scan, 2006  . Vertigo   . Diverticulum   . Incisional hernia   . Rash     Ticlid  . Cough     ACE Inhibitor, tolerates ARB  . Carotid artery disease     Doppler, 2006, minimal disease  //  Doppler, May, 2012, mild increase velocities, 40-59% bilateral stenoses  . Renal cyst     Hyperdense cyst lower pole left kidney  . Ejection fraction  EF 65%, catheterization, 12-06-06  . Dizziness   . Unsteady gait   . Sinus bradycardia    PAST SURGICAL HISTORY: Past Surgical History  Procedure Laterality Date  . Esophagogastroduodenoscopy    . Cardiac catheterization    . Incisional hernia repair      FAMILY HISTORY Family History  Problem Relation Age of Onset  . Breast cancer Sister     2 sisters  . Pemphigus vulgaris Mother     died at 39  The patient's father died at the age of 79 from an embolus.  The patient's mother died at the age of 12 in the setting of dementia.  The patient had 1 sister die with breast cancer (neglected), 1 sister died with diabetes, and 1 brother with Parkinson's disease.  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  He has been married 60+ years to Windell Moulding, who used to be a Scientific laboratory technician.  They have 4 children; 2 of them are Urologists in [place] (married to twin sisters).  In particular, they would like me to contact Arlys John [phone] is the fax #) with any change in plans.  One of the sons, the oldest one, was a [place] in [place] (died in 12/05/12 and from non-Hodgkin's lymphoma) and then the daughter, Huntley Dec, is our Dr. Macon Large wife. The patient has a total of 12 grandchildren and is waiting on great-grandchildren.   ADVANCED DIRECTIVES: in place  HEALTH MAINTENANCE: History  Substance Use Topics  . Smoking status: Never Smoker   . Smokeless tobacco: Not on file  . Alcohol Use: No     Colonoscopy: 06-Dec-2003  Bone density:  Lipid panel:  Allergies  Allergen Reactions  . Lisinopril     REACTION: COUGH  . Ticlopidine Hcl     REACTION: rash    Current Outpatient Prescriptions  Medication Sig Dispense Refill  . aspirin 81 MG tablet Take 81 mg by mouth daily.        . colestipol (MICRONIZED COLESTIPOL HCL) 1 G tablet Take 1 tablet (1 g total) by mouth daily.  450 tablet  3  . Fluticasone-Salmeterol (ADVAIR DISKUS) 100-50 MCG/DOSE AEPB Inhale 1 puff into the lungs every 12 (twelve) hours. As needed  60 each  11  . ipratropium (ATROVENT) 0.06 % nasal spray Place 2 sprays into the nose as needed.       Marland Kitchen leuprolide (LUPRON) 3.75 MG injection Given by Dr Leodis Liverpool -- not sure of dosage      . meclizine (ANTIVERT) 25 MG tablet TAKE 1 TABLET (25 MG TOTAL) BY MOUTH EVERY 8 (EIGHT) HOURS AS NEEDED FOR DIZZINESS.  30 tablet  1  . methimazole (TAPAZOLE) 5 MG tablet Take 1 tablet (5 mg total) by mouth 3 (three) times a week.  40 tablet  1  . metoprolol (LOPRESSOR) 25 MG tablet Take 1 tablet (25 mg total) by mouth 2 (two) times daily.  180 tablet  1  . Misc. Devices (QUAD CANE) MISC 1 Device by Does not apply route once.  1 each  0  . nitroGLYCERIN (NITROSTAT) 0.4 MG SL tablet Place 1 tablet  (0.4 mg total) under the tongue every 5 (five) minutes as needed.  50 tablet  0  . olmesartan (BENICAR) 5 MG tablet Take 2 tablets (10 mg total) by mouth daily.  180 tablet  3  . ondansetron (ZOFRAN) 4 MG tablet Take 1 tablet (4 mg total) by mouth every 8 (eight) hours as needed for nausea.  20  tablet  0  . pantoprazole (PROTONIX) 40 MG tablet TAKE 1 TABLET (40 MG TOTAL) BY MOUTH DAILY.  90 tablet  3  . zolpidem (AMBIEN) 5 MG tablet Take 1 tablet (5 mg total) by mouth at bedtime as needed for sleep.  30 tablet  3   No current facility-administered medications for this visit.    OBJECTIVE: Elderly white male who appears frail Filed Vitals:   06/14/13 1145  BP: 146/68  Pulse: 65  Temp: 96.8 F (36 Elliott)  Resp: 18     Body mass index is 23.92 kg/(m^2).    ECOG FS: 1  Sclerae unicteric, pupils round and reactive Oropharynx clear and moist No peripheral adenopathy Lungs no rales or rhonchi Heart regular rate and rhythm, no murmur appreciated Abd soft, nontender, positive bowel sounds, no organomegaly MSK no focal spinal tenderness including palpation of the lower spine, Neuro: nonfocal, well oriented, appropriate affect  LAB RESULTS: Results for BENJIMEN, KELLEY (MRN [id number]) as of 06/14/2013 12:00  Ref. Range 01/02/2013 10:59 02/28/2013 10:31 04/03/2013 10:16 05/15/2013 09:54 06/12/2013 10:07  PSA Latest Range: <=4.00 ng/mL 0.25 0.07 0.06 0.06 0.08  Results for GRIFFYN, KUCINSKI (MRN [id number]) as of 06/14/2013 12:00  Ref. Range 01/02/2013 10:59 02/28/2013 10:31 04/03/2013 10:16 05/15/2013 09:54 06/12/2013 10:07  Testosterone Latest Range: 300-890 ng/dL 784 (L) 26 (L) 46 (L) 31 (L) <10 (L)    Lab Results  Component Value Date   WBC 5.9 06/12/2013   NEUTROABS 4.1 06/12/2013   HGB 12.1* 06/12/2013   HCT 34.7* 06/12/2013   MCV 95.9 06/12/2013   PLT 179 06/12/2013      Chemistry      Component Value Date/Time   NA 141 06/12/2013 1007   NA 140 09/26/2012 1141   K 4.5 06/12/2013 1007    K 4.1 09/26/2012 1141   CL 107 01/02/2013 1059   CL 105 09/26/2012 1141   CO2 24 06/12/2013 1007   CO2 23 09/26/2012 1141   BUN 23.1 06/12/2013 1007   BUN 20 09/26/2012 1141   CREATININE 1.2 06/12/2013 1007   CREATININE 0.98 09/26/2012 1141      Component Value Date/Time   CALCIUM 9.9 06/12/2013 1007   CALCIUM 9.9 09/26/2012 1141   CALCIUM 10.0 01/31/2007 2246   ALKPHOS 104 06/12/2013 1007   ALKPHOS 109 09/26/2012 1141   AST 47* 06/12/2013 1007   AST 50* 09/26/2012 1141   ALT 34 06/12/2013 1007   ALT 38 09/26/2012 1141   BILITOT 0.64 06/12/2013 1007   BILITOT 0.6 09/26/2012 1141       No results found for this basename: LABCA2    No components found with this basename: LABCA125    No results found for this basename: INR,  in the last 168 hours  Urinalysis    Component Value Date/Time   COLORURINE YELLOW 04/20/2010 1540   APPEARANCEUR CLEAR 04/20/2010 1540   LABSPEC >=1.030 04/20/2010 1540   PHURINE 5.5 04/20/2010 1540   BILIRUBINUR NEGATIVE 04/20/2010 1540   KETONESUR TRACE 04/20/2010 1540   UROBILINOGEN 0.2 04/20/2010 1540   NITRITE NEGATIVE 04/20/2010 1540   LEUKOCYTESUR NEGATIVE 04/20/2010 1540    STUDIES: No results found.   ASSESSMENT: 79 y.o.  Kurt Elliott with a history of prostate cancer initially biopsied January 2008, Gleason 4+5, with a rapid doubling time, treated initially with Casodex and Lupron with an excellent PSA response, status post radiation to 78 Gy given with curative intent completed October 2008.  Off Lupron as of December  2008.  (1) With the eventual development of right hip pain and a rapid PSA doubling time, he resumed Lupron and Casodex September 2011 with the Casodex stopped after a month.  He receives Lupron intermittently, depending on his PSA and testosterone levels, most recent dose 01/09/2013  PLAN: Kurt Elliott is fine as far as his prostate cancer is concerned and does not require any intervention at present. I am concerned about his bones and at the next visit  we will consider a bone density and the possibility of using Reclast. His son's recent death is tragic, but he seems to be bearing up well. He is looking forward to going to [place] for Thanksgiving's with the family.  We are going to continue to check lab work every 6 weeks, with visits every 6 months, and Lupron as needed. He knows to call for any problems that may develop before the next visit here.  Kurt Elliott    06/14/2013

## 2013-07-10 ENCOUNTER — Telehealth: Payer: Self-pay

## 2013-07-10 MED ORDER — COLESTIPOL HCL 1 G PO TABS: 5.0000 g | ORAL_TABLET | Freq: Every day | ORAL | Status: DC

## 2013-07-10 NOTE — Telephone Encounter (Signed)
Left voicemail instructing patient to take 5 pills/day and that medication was sent to pharmacy.

## 2013-07-10 NOTE — Telephone Encounter (Signed)
No 5 pill is correct.  i have corrected, and sent a prescription refill to your pharmacy

## 2013-07-10 NOTE — Telephone Encounter (Signed)
Patient called requesting clarification on medication. Patient states colestipol has been changed from taking 5 pills a day to taking 1 a day. Patient wanted to make sure that this was correct.   Please advise,  Thanks!

## 2013-07-11 NOTE — Telephone Encounter (Signed)
Left voicemail instructing of below advised to call back if there were any questions.

## 2013-07-31 ENCOUNTER — Encounter (INDEPENDENT_AMBULATORY_CARE_PROVIDER_SITE_OTHER): Payer: Self-pay

## 2013-07-31 ENCOUNTER — Other Ambulatory Visit (HOSPITAL_BASED_OUTPATIENT_CLINIC_OR_DEPARTMENT_OTHER): Payer: Medicare Other

## 2013-07-31 DIAGNOSIS — C61 Malignant neoplasm of prostate: Secondary | ICD-10-CM

## 2013-07-31 LAB — CBC WITH DIFFERENTIAL/PLATELET
BASO%: 0.4 % (ref 0.0–2.0)
Basophils Absolute: 0 10*3/uL (ref 0.0–0.1)
EOS ABS: 0.3 10*3/uL (ref 0.0–0.5)
EOS%: 4.7 % (ref 0.0–7.0)
HCT: 34.2 % — ABNORMAL LOW (ref 38.4–49.9)
HGB: 12.2 g/dL — ABNORMAL LOW (ref 13.0–17.1)
LYMPH%: 23.9 % (ref 14.0–49.0)
MCH: 34.1 pg — ABNORMAL HIGH (ref 27.2–33.4)
MCHC: 35.5 g/dL (ref 32.0–36.0)
MCV: 96.1 fL (ref 79.3–98.0)
MONO#: 0.3 10*3/uL (ref 0.1–0.9)
MONO%: 5 % (ref 0.0–14.0)
NEUT#: 3.9 10*3/uL (ref 1.5–6.5)
NEUT%: 66 % (ref 39.0–75.0)
PLATELETS: 168 10*3/uL (ref 140–400)
RBC: 3.56 10*6/uL — AB (ref 4.20–5.82)
RDW: 12.6 % (ref 11.0–14.6)
WBC: 5.8 10*3/uL (ref 4.0–10.3)
lymph#: 1.4 10*3/uL (ref 0.9–3.3)

## 2013-07-31 LAB — COMPREHENSIVE METABOLIC PANEL (CC13)
ALT: 35 U/L (ref 0–55)
ANION GAP: 8 meq/L (ref 3–11)
AST: 40 U/L — ABNORMAL HIGH (ref 5–34)
Albumin: 3.8 g/dL (ref 3.5–5.0)
Alkaline Phosphatase: 84 U/L (ref 40–150)
BUN: 26.5 mg/dL — ABNORMAL HIGH (ref 7.0–26.0)
CHLORIDE: 107 meq/L (ref 98–109)
CO2: 23 mEq/L (ref 22–29)
Calcium: 9.7 mg/dL (ref 8.4–10.4)
Creatinine: 1.2 mg/dL (ref 0.7–1.3)
GLUCOSE: 154 mg/dL — AB (ref 70–140)
Potassium: 4.2 mEq/L (ref 3.5–5.1)
Sodium: 139 mEq/L (ref 136–145)
TOTAL PROTEIN: 7.2 g/dL (ref 6.4–8.3)
Total Bilirubin: 0.77 mg/dL (ref 0.20–1.20)

## 2013-07-31 LAB — TESTOSTERONE: TESTOSTERONE: 102 ng/dL — AB (ref 300–890)

## 2013-07-31 LAB — PSA: PSA: 0.24 ng/mL (ref <=4.00)

## 2013-08-03 ENCOUNTER — Encounter: Payer: Self-pay | Admitting: Oncology

## 2013-08-10 ENCOUNTER — Other Ambulatory Visit: Payer: Self-pay | Admitting: Cardiology

## 2013-08-10 NOTE — Telephone Encounter (Signed)
Is this ok to refill for this patient? Please advise. Thanks, MI 

## 2013-08-10 NOTE — Telephone Encounter (Signed)
**Note De-Identified  Obfuscation** I talked with a pharmacist at [facility] who states that Dr. Loanne Drilling normally fills this med for the pt and they will fax this refill request to them.

## 2013-08-13 ENCOUNTER — Other Ambulatory Visit: Payer: Self-pay

## 2013-08-14 ENCOUNTER — Other Ambulatory Visit: Payer: Self-pay | Admitting: *Deleted

## 2013-08-14 ENCOUNTER — Telehealth: Payer: Self-pay

## 2013-08-14 ENCOUNTER — Telehealth: Payer: Self-pay | Admitting: Oncology

## 2013-08-14 MED ORDER — METHIMAZOLE 5 MG PO TABS: 5.0000 mg | ORAL_TABLET | ORAL | Status: DC

## 2013-08-14 NOTE — Telephone Encounter (Signed)
lmonvm for pt re appt for 1/21 @ 1pm.

## 2013-08-14 NOTE — Telephone Encounter (Signed)
Please refill x 1 Ov is due  

## 2013-08-15 ENCOUNTER — Ambulatory Visit (HOSPITAL_BASED_OUTPATIENT_CLINIC_OR_DEPARTMENT_OTHER): Payer: Medicare Other

## 2013-08-15 VITALS — BP 127/42 | HR 64 | Temp 97.4°F

## 2013-08-15 DIAGNOSIS — Z5111 Encounter for antineoplastic chemotherapy: Secondary | ICD-10-CM

## 2013-08-15 DIAGNOSIS — C61 Malignant neoplasm of prostate: Secondary | ICD-10-CM

## 2013-08-15 MED ORDER — LEUPROLIDE ACETATE (3 MONTH) 22.5 MG IM KIT
22.5000 mg | PACK | Freq: Once | INTRAMUSCULAR | Status: AC
  Administered 2013-08-15: 22.5 mg via INTRAMUSCULAR
  Filled 2013-08-15: qty 22.5

## 2013-08-15 NOTE — Patient Instructions (Signed)

## 2013-09-10 ENCOUNTER — Ambulatory Visit (HOSPITAL_BASED_OUTPATIENT_CLINIC_OR_DEPARTMENT_OTHER): Payer: Medicare Other

## 2013-09-10 DIAGNOSIS — C61 Malignant neoplasm of prostate: Secondary | ICD-10-CM | POA: Diagnosis not present

## 2013-09-10 LAB — COMPREHENSIVE METABOLIC PANEL (CC13)
ALT: 34 U/L (ref 0–55)
AST: 50 U/L — AB (ref 5–34)
Albumin: 4 g/dL (ref 3.5–5.0)
Alkaline Phosphatase: 98 U/L (ref 40–150)
Anion Gap: 7 mEq/L (ref 3–11)
BILIRUBIN TOTAL: 1.33 mg/dL — AB (ref 0.20–1.20)
BUN: 21.9 mg/dL (ref 7.0–26.0)
CO2: 25 mEq/L (ref 22–29)
CREATININE: 1.2 mg/dL (ref 0.7–1.3)
Calcium: 10.6 mg/dL — ABNORMAL HIGH (ref 8.4–10.4)
Chloride: 107 mEq/L (ref 98–109)
GLUCOSE: 93 mg/dL (ref 70–140)
POTASSIUM: 4.4 meq/L (ref 3.5–5.1)
Sodium: 139 mEq/L (ref 136–145)
Total Protein: 7.3 g/dL (ref 6.4–8.3)

## 2013-09-10 LAB — CBC WITH DIFFERENTIAL/PLATELET
BASO%: 0.2 % (ref 0.0–2.0)
Basophils Absolute: 0 10*3/uL (ref 0.0–0.1)
EOS%: 4.6 % (ref 0.0–7.0)
Eosinophils Absolute: 0.3 10*3/uL (ref 0.0–0.5)
HCT: 35.2 % — ABNORMAL LOW (ref 38.4–49.9)
HGB: 12.3 g/dL — ABNORMAL LOW (ref 13.0–17.1)
LYMPH#: 1.4 10*3/uL (ref 0.9–3.3)
LYMPH%: 23.1 % (ref 14.0–49.0)
MCH: 33.1 pg (ref 27.2–33.4)
MCHC: 34.9 g/dL (ref 32.0–36.0)
MCV: 94.6 fL (ref 79.3–98.0)
MONO#: 0.4 10*3/uL (ref 0.1–0.9)
MONO%: 6 % (ref 0.0–14.0)
NEUT#: 3.9 10*3/uL (ref 1.5–6.5)
NEUT%: 66.1 % (ref 39.0–75.0)
Platelets: 192 10*3/uL (ref 140–400)
RBC: 3.72 10*6/uL — ABNORMAL LOW (ref 4.20–5.82)
RDW: 12.7 % (ref 11.0–14.6)
WBC: 5.9 10*3/uL (ref 4.0–10.3)

## 2013-09-11 ENCOUNTER — Other Ambulatory Visit: Payer: Medicare Other

## 2013-09-11 ENCOUNTER — Ambulatory Visit: Payer: Medicare Other | Admitting: Cardiology

## 2013-09-11 LAB — TESTOSTERONE: Testosterone: 15 ng/dL — ABNORMAL LOW (ref 300–890)

## 2013-09-11 LAB — PSA: PSA: 0.18 ng/mL (ref <=4.00)

## 2013-09-19 DIAGNOSIS — H1044 Vernal conjunctivitis: Secondary | ICD-10-CM | POA: Diagnosis not present

## 2013-10-08 ENCOUNTER — Ambulatory Visit (INDEPENDENT_AMBULATORY_CARE_PROVIDER_SITE_OTHER): Payer: Medicare Other | Admitting: Cardiology

## 2013-10-08 ENCOUNTER — Encounter: Payer: Self-pay | Admitting: Cardiology

## 2013-10-08 VITALS — BP 122/52 | HR 60 | Ht 70.0 in | Wt 164.0 lb

## 2013-10-08 DIAGNOSIS — I2510 Atherosclerotic heart disease of native coronary artery without angina pectoris: Secondary | ICD-10-CM | POA: Diagnosis not present

## 2013-10-08 DIAGNOSIS — R42 Dizziness and giddiness: Secondary | ICD-10-CM | POA: Diagnosis not present

## 2013-10-08 NOTE — Assessment & Plan Note (Signed)
He did have one episode of exertional chest discomfort while walking briskly and traveling in a very cold and when the area. He has had no significant symptoms since then. He continues to swim without symptoms. I've chosen not to change his medications.

## 2013-10-08 NOTE — Assessment & Plan Note (Signed)
I have lowered his beta blocker dose and he was feeling better. He has had one brief episode while traveling. I'm hesitant to lower the beta blocker any the lower at this point. He did have exertional chest discomfort. I'm hesitant to adjust any further antianginal meds because he had another mild episode of dizziness. I feel it is most prudent to continue his same medicines at this time and provide careful followup.

## 2013-10-08 NOTE — Patient Instructions (Signed)
Your physician recommends that you continue on your current medications as directed. Please refer to the Current Medication list given to you today.  Your physician recommends that you schedule a follow-up appointment in: 8 weeks

## 2013-10-08 NOTE — Progress Notes (Signed)
HPI  Patient is seen today for followup coronary disease and some dizzy spells. His echo in November, 2014 showed an EF of 65-70%. He had some mild bradycardia and we decreased his metoprolol dose. He was feeling better in November, 2014. Since that time he was traveling with his son and was in a situation where he had to walk briskly in a very windy situation. He had chest discomfort. He has not had any recurrence since then. In addition he continues to swim regularly with no symptoms. Also while traveling he had one episode of mild dizziness. He has not had any since.  Allergies  Allergen Reactions  . Lisinopril     REACTION: COUGH  . Ticlopidine Hcl     REACTION: rash    Current Outpatient Prescriptions  Medication Sig Dispense Refill  . aspirin 81 MG tablet Take 81 mg by mouth daily.        . colestipol (COLESTID) 1 G tablet Take 5 tablets (5 g total) by mouth daily.  150 tablet  11  . Fluticasone-Salmeterol (ADVAIR DISKUS) 100-50 MCG/DOSE AEPB Inhale 1 puff into the lungs every 12 (twelve) hours. As needed  60 each  11  . ipratropium (ATROVENT) 0.06 % nasal spray Place 2 sprays into the nose as needed.       Marland Kitchen leuprolide (LUPRON) 3.75 MG injection Given by Dr Darrick Penna -- not sure of dosage      . meclizine (ANTIVERT) 25 MG tablet TAKE 1 TABLET (25 MG TOTAL) BY MOUTH EVERY 8 (EIGHT) HOURS AS NEEDED FOR DIZZINESS.  30 tablet  1  . methimazole (TAPAZOLE) 5 MG tablet Take 1 tablet (5 mg total) by mouth 3 (three) times a week.  40 tablet  1  . metoprolol (LOPRESSOR) 25 MG tablet Take 1 tablet (25 mg total) by mouth 2 (two) times daily.  180 tablet  1  . Misc. Devices (QUAD CANE) MISC 1 Device by Does not apply route once.  1 each  0  . nitroGLYCERIN (NITROSTAT) 0.4 MG SL tablet Place 1 tablet (0.4 mg total) under the tongue every 5 (five) minutes as needed.  50 tablet  0  . olmesartan (BENICAR) 5 MG tablet Take 2 tablets (10 mg total) by mouth daily.  180 tablet  3  . ondansetron  (ZOFRAN) 4 MG tablet Take 1 tablet (4 mg total) by mouth every 8 (eight) hours as needed for nausea.  20 tablet  0  . pantoprazole (PROTONIX) 40 MG tablet TAKE 1 TABLET (40 MG TOTAL) BY MOUTH DAILY.  90 tablet  3  . zolpidem (AMBIEN) 5 MG tablet Take 1 tablet (5 mg total) by mouth at bedtime as needed for sleep.  30 tablet  3   No current facility-administered medications for this visit.    History   Social History  . Marital Status: Married    Spouse Name: N/A    Number of Children: 4  . Years of Education: N/A   Occupational History  . 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 Smoking status: Never Smoker   . Smokeless tobacco: Not on file  . Alcohol Use: No  . Drug Use: No  . Sexual Activity: Not on file   Other Topics Concern  . Not on file   Social History Narrative  . No narrative on file    Family History  Problem Relation Age of Onset  . Breast cancer Sister     2 sisters  .  Pemphigus vulgaris Mother     died at 32    Past Medical History  Diagnosis Date  . CAD (coronary artery disease)     PCI in the past / last catheterization May, 2008, medical therapy  . Hypertension   . Hyperlipemia   . Unspecified disorder of liver   . Cellulitis, leg     left  . Pulmonary nodule, right     2008 / unchanged CT scan, July, 2009  . Abdominal pain, left lower quadrant   . Adenocarcinoma of prostate   . Esophageal stricture   . Spinal stenosis   . Hyperglycemia   . Hyperthyroidism   . GERD (gastroesophageal reflux disease)   . Hx of colonic polyps   . Renal artery stenosis     Mild  . Atheroma     no penetrating ulcer, Abdominal aorta  . Inferior mesenteric artery injury     High-grade stenosis, CT scan, 2006  . Vertigo   . Diverticulum   . Incisional hernia   . Rash     Ticlid  . Cough     ACE Inhibitor, tolerates ARB  . Carotid artery disease     Doppler, 2006, minimal disease  //  Doppler, May, 2012, mild increase velocities, 40-59%  bilateral stenoses  . Renal cyst     Hyperdense cyst lower pole left kidney  . Ejection fraction     EF 65%, catheterization, 2008  . Dizziness   . Unsteady gait   . Sinus bradycardia     Past Surgical History  Procedure Laterality Date  . Esophagogastroduodenoscopy    . Cardiac catheterization    . Incisional hernia repair      Patient Active Problem List   Diagnosis Date Noted  . Dizziness   . Unsteady gait   . Sinus bradycardia   . Ejection fraction   . Carotid artery disease   . CAD (coronary artery disease)   . Pulmonary nodule, right   . Renal artery stenosis   . Rash   . Cough   . Renal cyst   . Inferior mesenteric artery injury   . ACUTE BRONCHITIS 06/29/2010  . VERTIGO 11/27/2009  . CELLULITIS, LEG, LEFT 12/30/2008  . UNSPECIFIED DISORDER OF LIVER 04/04/2008  . ABDOMINAL PAIN, LEFT LOWER QUADRANT 08/18/2007  . ADENOCARCINOMA, PROSTATE 06/15/2007  . HYPERTHYROIDISM 06/13/2007  . HYPERLIPIDEMIA 06/13/2007  . HYPERTENSION 06/13/2007  . ESOPHAGEAL STRICTURE 06/13/2007  . GERD 06/13/2007  . SPINAL STENOSIS 06/13/2007  . HYPERGLYCEMIA 06/13/2007  . COLONIC POLYPS, HX OF 06/13/2007    ROS   Patient denies fever, chills, headache, sweats, rash, change in vision, change in hearing, cough, nausea vomiting, urinary symptoms. All other systems are reviewed and are negative.  PHYSICAL EXAM  Patient is oriented to person time and place. Affect is normal. He's here with his wife. There is no jugular venous distention. Lungs are clear. Respiratory effort is nonlabored. Cardiac exam reveals S1 and S2. There is a soft systolic murmur. The abdomen is soft. There is no peripheral edema.  Filed Vitals:   10/08/13 1114  BP: 122/52  Pulse: 60  Height: 5\' 10"  (1.778 m)  Weight: 164 lb (74.39 kg)     ASSESSMENT & PLAN

## 2013-10-10 ENCOUNTER — Telehealth: Payer: Self-pay | Admitting: Oncology

## 2013-10-10 NOTE — Telephone Encounter (Signed)
pt came in today to r/s 5/12 appt to 5/13 due to he will be out of town. pt has new schedule for may. other appts remain the same.

## 2013-10-23 ENCOUNTER — Other Ambulatory Visit (HOSPITAL_BASED_OUTPATIENT_CLINIC_OR_DEPARTMENT_OTHER): Payer: Medicare Other

## 2013-10-23 DIAGNOSIS — C61 Malignant neoplasm of prostate: Secondary | ICD-10-CM | POA: Diagnosis not present

## 2013-10-23 LAB — CBC WITH DIFFERENTIAL/PLATELET
BASO%: 0.3 % (ref 0.0–2.0)
BASOS ABS: 0 10*3/uL (ref 0.0–0.1)
EOS ABS: 0.3 10*3/uL (ref 0.0–0.5)
EOS%: 4.9 % (ref 0.0–7.0)
HEMATOCRIT: 34.4 % — AB (ref 38.4–49.9)
HEMOGLOBIN: 12 g/dL — AB (ref 13.0–17.1)
LYMPH%: 20.9 % (ref 14.0–49.0)
MCH: 33.3 pg (ref 27.2–33.4)
MCHC: 34.9 g/dL (ref 32.0–36.0)
MCV: 95.3 fL (ref 79.3–98.0)
MONO#: 0.3 10*3/uL (ref 0.1–0.9)
MONO%: 6.1 % (ref 0.0–14.0)
NEUT#: 3.7 10*3/uL (ref 1.5–6.5)
NEUT%: 67.8 % (ref 39.0–75.0)
PLATELETS: 174 10*3/uL (ref 140–400)
RBC: 3.61 10*6/uL — AB (ref 4.20–5.82)
RDW: 12.3 % (ref 11.0–14.6)
WBC: 5.5 10*3/uL (ref 4.0–10.3)
lymph#: 1.1 10*3/uL (ref 0.9–3.3)

## 2013-10-23 LAB — COMPREHENSIVE METABOLIC PANEL (CC13)
ALT: 33 U/L (ref 0–55)
ANION GAP: 10 meq/L (ref 3–11)
AST: 46 U/L — ABNORMAL HIGH (ref 5–34)
Albumin: 3.9 g/dL (ref 3.5–5.0)
Alkaline Phosphatase: 94 U/L (ref 40–150)
BUN: 18.9 mg/dL (ref 7.0–26.0)
CALCIUM: 9.6 mg/dL (ref 8.4–10.4)
CHLORIDE: 110 meq/L — AB (ref 98–109)
CO2: 24 mEq/L (ref 22–29)
Creatinine: 1 mg/dL (ref 0.7–1.3)
Glucose: 118 mg/dl (ref 70–140)
Potassium: 4.2 mEq/L (ref 3.5–5.1)
Sodium: 143 mEq/L (ref 136–145)
Total Bilirubin: 0.94 mg/dL (ref 0.20–1.20)
Total Protein: 7.2 g/dL (ref 6.4–8.3)

## 2013-10-23 LAB — TESTOSTERONE: Testosterone: 42 ng/dL — ABNORMAL LOW (ref 300–890)

## 2013-10-23 LAB — PSA: PSA: 0.1 ng/mL (ref <=4.00)

## 2013-10-24 ENCOUNTER — Encounter: Payer: Self-pay | Admitting: Oncology

## 2013-11-05 DIAGNOSIS — D485 Neoplasm of uncertain behavior of skin: Secondary | ICD-10-CM | POA: Diagnosis not present

## 2013-11-05 DIAGNOSIS — H6120 Impacted cerumen, unspecified ear: Secondary | ICD-10-CM | POA: Diagnosis not present

## 2013-11-06 ENCOUNTER — Other Ambulatory Visit: Payer: Self-pay

## 2013-11-06 MED ORDER — METOPROLOL TARTRATE 25 MG PO TABS: 25.0000 mg | ORAL_TABLET | Freq: Two times a day (BID) | ORAL | Status: DC

## 2013-11-07 ENCOUNTER — Other Ambulatory Visit: Payer: Self-pay | Admitting: Otolaryngology

## 2013-11-07 DIAGNOSIS — L723 Sebaceous cyst: Secondary | ICD-10-CM | POA: Diagnosis not present

## 2013-11-07 DIAGNOSIS — D485 Neoplasm of uncertain behavior of skin: Secondary | ICD-10-CM | POA: Diagnosis not present

## 2013-11-07 DIAGNOSIS — L821 Other seborrheic keratosis: Secondary | ICD-10-CM | POA: Diagnosis not present

## 2013-11-21 DIAGNOSIS — H60399 Other infective otitis externa, unspecified ear: Secondary | ICD-10-CM | POA: Diagnosis not present

## 2013-11-27 DIAGNOSIS — H60399 Other infective otitis externa, unspecified ear: Secondary | ICD-10-CM | POA: Diagnosis not present

## 2013-12-03 ENCOUNTER — Ambulatory Visit: Payer: Medicare Other | Admitting: Cardiology

## 2013-12-03 ENCOUNTER — Telehealth: Payer: Self-pay | Admitting: *Deleted

## 2013-12-03 NOTE — Telephone Encounter (Signed)
Left message for pt to return my call so I can reschedule his appt w/ Dr. Jana Hakim.

## 2013-12-04 ENCOUNTER — Ambulatory Visit: Payer: Medicare Other | Admitting: Oncology

## 2013-12-04 ENCOUNTER — Other Ambulatory Visit: Payer: Medicare Other

## 2013-12-05 ENCOUNTER — Other Ambulatory Visit (HOSPITAL_BASED_OUTPATIENT_CLINIC_OR_DEPARTMENT_OTHER): Payer: Medicare Other

## 2013-12-05 ENCOUNTER — Telehealth: Payer: Self-pay | Admitting: Oncology

## 2013-12-05 ENCOUNTER — Ambulatory Visit (HOSPITAL_BASED_OUTPATIENT_CLINIC_OR_DEPARTMENT_OTHER): Payer: Medicare Other | Admitting: Oncology

## 2013-12-05 VITALS — BP 160/79 | HR 66 | Temp 97.5°F | Resp 18 | Ht 70.0 in | Wt 162.1 lb

## 2013-12-05 DIAGNOSIS — C61 Malignant neoplasm of prostate: Secondary | ICD-10-CM | POA: Diagnosis not present

## 2013-12-05 DIAGNOSIS — C50929 Malignant neoplasm of unspecified site of unspecified male breast: Secondary | ICD-10-CM | POA: Diagnosis not present

## 2013-12-05 LAB — CBC WITH DIFFERENTIAL/PLATELET
BASO%: 0.4 % (ref 0.0–2.0)
Basophils Absolute: 0 10*3/uL (ref 0.0–0.1)
EOS%: 4.7 % (ref 0.0–7.0)
Eosinophils Absolute: 0.3 10*3/uL (ref 0.0–0.5)
HCT: 33.7 % — ABNORMAL LOW (ref 38.4–49.9)
HGB: 11.8 g/dL — ABNORMAL LOW (ref 13.0–17.1)
LYMPH%: 21.8 % (ref 14.0–49.0)
MCH: 33.3 pg (ref 27.2–33.4)
MCHC: 34.9 g/dL (ref 32.0–36.0)
MCV: 95.3 fL (ref 79.3–98.0)
MONO#: 0.4 10*3/uL (ref 0.1–0.9)
MONO%: 6.3 % (ref 0.0–14.0)
NEUT#: 4.5 10*3/uL (ref 1.5–6.5)
NEUT%: 66.8 % (ref 39.0–75.0)
Platelets: 181 10*3/uL (ref 140–400)
RBC: 3.53 10*6/uL — AB (ref 4.20–5.82)
RDW: 12.5 % (ref 11.0–14.6)
WBC: 6.8 10*3/uL (ref 4.0–10.3)
lymph#: 1.5 10*3/uL (ref 0.9–3.3)

## 2013-12-05 LAB — COMPREHENSIVE METABOLIC PANEL (CC13)
ALT: 33 U/L (ref 0–55)
AST: 41 U/L — AB (ref 5–34)
Albumin: 3.8 g/dL (ref 3.5–5.0)
Alkaline Phosphatase: 98 U/L (ref 40–150)
Anion Gap: 9 mEq/L (ref 3–11)
BUN: 21 mg/dL (ref 7.0–26.0)
CALCIUM: 10 mg/dL (ref 8.4–10.4)
CHLORIDE: 108 meq/L (ref 98–109)
CO2: 24 mEq/L (ref 22–29)
CREATININE: 1 mg/dL (ref 0.7–1.3)
Glucose: 92 mg/dl (ref 70–140)
Potassium: 4.1 mEq/L (ref 3.5–5.1)
Sodium: 141 mEq/L (ref 136–145)
TOTAL PROTEIN: 7.2 g/dL (ref 6.4–8.3)
Total Bilirubin: 0.96 mg/dL (ref 0.20–1.20)

## 2013-12-05 MED ORDER — ZOLPIDEM TARTRATE 5 MG PO TABS: 5.0000 mg | ORAL_TABLET | Freq: Every evening | ORAL | Status: DC | PRN

## 2013-12-05 NOTE — Telephone Encounter (Signed)
, °

## 2013-12-05 NOTE — Progress Notes (Signed)
ID: [name]   DOB: [birth year]  MR#: [id number]  [id number]  PCP: Renato Shin, MD GYN: SU:  OTHER MD: Germain Osgood [id number]); Franchot Gallo; Lavonna Monarch; Daryel November; Erskine Emery; Arloa Koh, Ileene Hutchinson [facility]   HISTORY OF PROSTATE CANCER: Mr. Lammie tells me his PSA and prostate had been followed for a very long time.  His PSA was approximately 15 about a year ago [2007], then about 6 months ago it went to 48, which means that it had just about doubled in 6 months.  Of course, that raised red flags and the patient had his prostate biopsied at [facility] under Dr. Gareth Eagle 08/02/2006.  This showed a Gleason 9 (4+5) adenocarcinoma in 5 of the 6 cores, the other core showing a Gleason 8 (3+5).  In short, an aggressive tumor.  The patient had a bone scan 08/10/2006, which was negative, and CTs of the abdomen and pelvis 08/15/2006, which showed an enlarged prostate at 5.7 x 5.1 cm, but no evidence of adenopathy or extracapsular spread; no obvious invasion of the rectum or the bladder; and negative bone windows.  There was incidental nodularity around the left perirenal fat of unclear significance.   The patient was started on Casodex for about a month before receiving his first Lupron shot 09/05/2006.  He had a second Lupron shot 09/09/2006.  His PSA responded well, and his subsequent history is as detailed below.  INTERVAL HISTORY: Carder returns today with his wife Rod Holler for followup of his prostate cancer. The interval history is significant for a recent trip to [place] which she enjoyed greatly.  REVIEW OF SYSTEMS: He tells me he had something in his left ear and went to see Dr. Erik Obey to have it cleaned. He says the cleaning was a little bit too vigorous and he has pain in his left ear. He does not bear ago swimming with that. Aside from that, he feels fatigued, although no more than usual and according to his wife Marcelline Mates he is walking a little bit better than before. He does have  a bit of a running nose, but no significant cough or worsening shortness of breath. He denies pain except in the left ear and there have been no falls, dizziness, nausea, vomiting, or vertigo. There have been no bowel changes. A detailed review of systems today was otherwise noncontributory  PAST MEDICAL HISTORY: Past Medical History  Diagnosis Date  . CAD (coronary artery disease)     PCI in the past / last catheterization May, 2008, medical therapy  . Hypertension   . Hyperlipemia   . Unspecified disorder of liver   . Cellulitis, leg     left  . Pulmonary nodule, right     2008 / unchanged CT scan, July, 2009  . Abdominal pain, left lower quadrant   . Adenocarcinoma of prostate   . Esophageal stricture   . Spinal stenosis   . Hyperglycemia   . Hyperthyroidism   . GERD (gastroesophageal reflux disease)   . Hx of colonic polyps   . Renal artery stenosis     Mild  . Atheroma     no penetrating ulcer, Abdominal aorta  . Inferior mesenteric artery injury     High-grade stenosis, CT scan, 2006  . Vertigo   . Diverticulum   . Incisional hernia   . Rash     Ticlid  . Cough     ACE Inhibitor, tolerates ARB  . Carotid artery disease     Doppler, 2006,  minimal disease  //  Doppler, May, 2012, mild increase velocities, 40-59% bilateral stenoses  . Renal cyst     Hyperdense cyst lower pole left kidney  . Ejection fraction     EF 65%, catheterization, 11/14/06  . Dizziness   . Unsteady gait   . Sinus bradycardia    PAST SURGICAL HISTORY: Past Surgical History  Procedure Laterality Date  . Esophagogastroduodenoscopy    . Cardiac catheterization    . Incisional hernia repair      FAMILY HISTORY Family History  Problem Relation Age of Onset  . Breast cancer Sister     2 sisters  . Pemphigus vulgaris Mother     died at 49  The patient's father died at the age of 61 from an embolus.  The patient's mother died at the age of 36 in the setting of dementia.  The patient had 1  sister die with breast cancer (neglected), 1 sister died with diabetes, and 1 brother with Parkinson's disease.  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  He has been married 60+ years to Rod Holler, who used to be a Firefighter.  They have 4 children; 2 of them are Urologists in [place] (married to twin sisters).  In particular, they would like me to contact Aaron Edelman [phone] is the fax #) with any change in plans.  One of the sons, the oldest one, was a [place] in [place] (died in 11-13-2012 and from non-Hodgkin's lymphoma) and then the daughter, Clarise Cruz, is our Dr. Rosanna Randy wife. The patient has a total of 12 grandchildren and is waiting on great-grandchildren.   ADVANCED DIRECTIVES: in place  HEALTH MAINTENANCE: History  Substance Use Topics  . Smoking status: Never Smoker   . Smokeless tobacco: Not on file  . Alcohol Use: No     Colonoscopy: 2003-11-14  Bone density:  Lipid panel:  Allergies  Allergen Reactions  . Lisinopril     REACTION: COUGH  . Ticlopidine Hcl     REACTION: rash    Current Outpatient Prescriptions  Medication Sig Dispense Refill  . aspirin 81 MG tablet Take 81 mg by mouth daily.        . colestipol (COLESTID) 1 G tablet Take 5 tablets (5 g total) by mouth daily.  150 tablet  11  . Fluticasone-Salmeterol (ADVAIR DISKUS) 100-50 MCG/DOSE AEPB Inhale 1 puff into the lungs every 12 (twelve) hours. As needed  60 each  11  . ipratropium (ATROVENT) 0.06 % nasal spray Place 2 sprays into the nose as needed.       Marland Kitchen leuprolide (LUPRON) 3.75 MG injection Given by Dr Darrick Penna -- not sure of dosage      . meclizine (ANTIVERT) 25 MG tablet TAKE 1 TABLET (25 MG TOTAL) BY MOUTH EVERY 8 (EIGHT) HOURS AS NEEDED FOR DIZZINESS.  30 tablet  1  . methimazole (TAPAZOLE) 5 MG tablet Take 1 tablet (5 mg total) by mouth 3 (three) times a week.  40 tablet  1  . metoprolol tartrate (LOPRESSOR) 25 MG tablet Take 1 tablet (25 mg total) by mouth 2 (two) times daily.  180 tablet   0  . Misc. Devices (QUAD CANE) MISC 1 Device by Does not apply route once.  1 each  0  . nitroGLYCERIN (NITROSTAT) 0.4 MG SL tablet Place 1 tablet (0.4 mg total) under the tongue every 5 (five) minutes as needed.  50 tablet  0  . olmesartan (BENICAR) 5 MG tablet Take 2 tablets (10  mg total) by mouth daily.  180 tablet  3  . ondansetron (ZOFRAN) 4 MG tablet Take 1 tablet (4 mg total) by mouth every 8 (eight) hours as needed for nausea.  20 tablet  0  . pantoprazole (PROTONIX) 40 MG tablet TAKE 1 TABLET (40 MG TOTAL) BY MOUTH DAILY.  90 tablet  3  . zolpidem (AMBIEN) 5 MG tablet Take 1 tablet (5 mg total) by mouth at bedtime as needed for sleep.  30 tablet  3   No current facility-administered medications for this visit.    OBJECTIVE: Elderly white male in no acute distres Filed Vitals:   12/05/13 1351  BP: 160/79  Pulse: 66  Temp: 97.5 F (36.4 C)  Resp: 18     Body mass index is 23.26 kg/(m^2).    ECOG FS: 2  Sclerae unicteric, EOMs intact Oropharynx clear and moist; right tympanic membrane is probably with a normal light reflex; in the left ear canal two thirds of the way in there is a scab with some erythema consistent with a small healing wound No cervical or supraclavicular adenopathy Lungs no rales or rhonchi Heart regular rate and rhythm Abd soft, nontender, positive bowel sounds, no organomegaly MSK no focal spinal tenderness including palpation of the lower spine, Neuro: nonfocal, well oriented, positive affect  LAB RESULTS: PSA and testosterone levels today pending  Lab Results  Component Value Date   WBC 6.8 12/05/2013   NEUTROABS 4.5 12/05/2013   HGB 11.8* 12/05/2013   HCT 33.7* 12/05/2013   MCV 95.3 12/05/2013   PLT 181 12/05/2013      Chemistry      Component Value Date/Time   NA 141 12/05/2013 1333   NA 140 09/26/2012 1141   K 4.1 12/05/2013 1333   K 4.1 09/26/2012 1141   CL 107 01/02/2013 1059   CL 105 09/26/2012 1141   CO2 24 12/05/2013 1333   CO2 23 09/26/2012 1141    BUN 21.0 12/05/2013 1333   BUN 20 09/26/2012 1141   CREATININE 1.0 12/05/2013 1333   CREATININE 0.98 09/26/2012 1141      Component Value Date/Time   CALCIUM 10.0 12/05/2013 1333   CALCIUM 9.9 09/26/2012 1141   CALCIUM 10.0 01/31/2007 2246   ALKPHOS 98 12/05/2013 1333   ALKPHOS 109 09/26/2012 1141   AST 41* 12/05/2013 1333   AST 50* 09/26/2012 1141   ALT 33 12/05/2013 1333   ALT 38 09/26/2012 1141   BILITOT 0.96 12/05/2013 1333   BILITOT 0.6 09/26/2012 1141       No results found for this basename: LABCA2    No components found with this basename: LABCA125    No results found for this basename: INR,  in the last 168 hours  Urinalysis    Component Value Date/Time   COLORURINE YELLOW 04/20/2010 [place] 04/20/2010 1540   LABSPEC >=1.030 04/20/2010 1540   PHURINE 5.5 04/20/2010 1540   GLUCOSEU NEGATIVE 04/20/2010 1540   BILIRUBINUR NEGATIVE 04/20/2010 1540   KETONESUR TRACE 04/20/2010 1540   UROBILINOGEN 0.2 04/20/2010 1540   NITRITE NEGATIVE 04/20/2010 [place] 04/20/2010 1540    STUDIES: No results found.   ASSESSMENT: 80 y.o.  Cottle man with a history of prostate cancer initially biopsied January 2008, Gleason 4+5, with a rapid doubling time, treated initially with Casodex and Lupron with an excellent PSA response, status post radiation to 78 Gy given with curative intent completed October 2008.  Off Lupron as of  December 2008.  (1) With the eventual development of right hip pain and a rapid PSA doubling time, he resumed Lupron and Casodex September 2011 with the Casodex stopped after a month.  He receives Lupron intermittently, depending on his PSA and testosterone levels, most recent dose 01/09/2013  PLAN: Ignazio is likely to need his Lupron, but last time even though his testosterone had risen, his PSA had not. Accordingly we are going to wait on results. If he has not heard from [place] in a few days he will call and I gave him my nurse's phone number so  he would not have to go through the front phone line, which she finds distressing.  He has a little bit of a scab still in his left ear canal and I suggested he did not go swimming until June 1. I am sure by then it will have healed sufficiently we will not have to worry about infection. Otherwise we are going to continue to check his lab work every 6 weeks and treated with Lupron as needed to keep his PSA and testosterone level under control. He has a good understanding of the overall plan. He agrees with it. He knows the goal of treatment in his case is control.  He will call with any problems that may develop before his next visit here.  Virgie Dad Onell Mcmath    12/05/2013

## 2013-12-06 LAB — PSA: PSA: 0.09 ng/mL (ref <=4.00)

## 2013-12-06 LAB — TESTOSTERONE: TESTOSTERONE: 24 ng/dL — AB (ref 300–890)

## 2013-12-10 ENCOUNTER — Telehealth: Payer: Self-pay | Admitting: *Deleted

## 2013-12-10 NOTE — Telephone Encounter (Signed)
Patient calling and left voicemail for PSA results. Called back, no answer, left voicemail of PSA value, encouraged to call back any further questions.

## 2013-12-11 ENCOUNTER — Ambulatory Visit (INDEPENDENT_AMBULATORY_CARE_PROVIDER_SITE_OTHER): Payer: Medicare Other | Admitting: Cardiology

## 2013-12-11 ENCOUNTER — Encounter: Payer: Self-pay | Admitting: Cardiology

## 2013-12-11 VITALS — BP 124/56 | HR 68 | Ht 70.0 in | Wt 160.0 lb

## 2013-12-11 DIAGNOSIS — E785 Hyperlipidemia, unspecified: Secondary | ICD-10-CM

## 2013-12-11 DIAGNOSIS — I779 Disorder of arteries and arterioles, unspecified: Secondary | ICD-10-CM | POA: Diagnosis not present

## 2013-12-11 DIAGNOSIS — I10 Essential (primary) hypertension: Secondary | ICD-10-CM

## 2013-12-11 DIAGNOSIS — I2510 Atherosclerotic heart disease of native coronary artery without angina pectoris: Secondary | ICD-10-CM | POA: Diagnosis not present

## 2013-12-11 DIAGNOSIS — R42 Dizziness and giddiness: Secondary | ICD-10-CM

## 2013-12-11 DIAGNOSIS — I739 Peripheral vascular disease, unspecified: Secondary | ICD-10-CM

## 2013-12-11 MED ORDER — OLMESARTAN MEDOXOMIL 5 MG PO TABS: 10.0000 mg | ORAL_TABLET | Freq: Every day | ORAL | Status: DC

## 2013-12-11 NOTE — Assessment & Plan Note (Signed)
His lipids are treated by Dr. Loanne Drilling.

## 2013-12-11 NOTE — Progress Notes (Signed)
Patient ID: Kurt Elliott, male   DOB: 04-Sep-1920, 80 y.o.   MRN: [id number]    HPI  Patient is seen today to followup coronary disease. He has had some dizzy spells. We lowered his metoprolol dose. He's had no significant recurrence. He has good left ventricular function. When I saw him last he had told me about having some chest discomfort when walking briskly and 80: The weather situation in the [place]. He's had no recurrence. He continues to swim without difficulties. When he walks he sometimes develops some hip pain. After he gets the hip pain he may have some mild shortness of breath.  Allergies  Allergen Reactions  . Lisinopril     REACTION: COUGH  . Ticlopidine Hcl     REACTION: rash    Current Outpatient Prescriptions  Medication Sig Dispense Refill  . aspirin 81 MG tablet Take 81 mg by mouth daily.        . colestipol (COLESTID) 1 G tablet Take 5 tablets (5 g total) by mouth daily.  150 tablet  11  . Fluticasone-Salmeterol (ADVAIR DISKUS) 100-50 MCG/DOSE AEPB Inhale 1 puff into the lungs every 12 (twelve) hours. As needed  60 each  11  . ipratropium (ATROVENT) 0.06 % nasal spray Place 2 sprays into the nose as needed.       Marland Kitchen leuprolide (LUPRON) 3.75 MG injection Given by Dr Darrick Penna -- not sure of dosage      . meclizine (ANTIVERT) 25 MG tablet TAKE 1 TABLET (25 MG TOTAL) BY MOUTH EVERY 8 (EIGHT) HOURS AS NEEDED FOR DIZZINESS.  30 tablet  1  . methimazole (TAPAZOLE) 5 MG tablet Take 1 tablet (5 mg total) by mouth 3 (three) times a week.  40 tablet  1  . metoprolol tartrate (LOPRESSOR) 25 MG tablet Take 1 tablet (25 mg total) by mouth 2 (two) times daily.  180 tablet  0  . Misc. Devices (QUAD CANE) MISC 1 Device by Does not apply route once.  1 each  0  . nitroGLYCERIN (NITROSTAT) 0.4 MG SL tablet Place 1 tablet (0.4 mg total) under the tongue every 5 (five) minutes as needed.  50 tablet  0  . olmesartan (BENICAR) 5 MG tablet Take 2 tablets (10 mg total) by mouth daily.  180  tablet  3  . ondansetron (ZOFRAN) 4 MG tablet Take 1 tablet (4 mg total) by mouth every 8 (eight) hours as needed for nausea.  20 tablet  0  . pantoprazole (PROTONIX) 40 MG tablet TAKE 1 TABLET (40 MG TOTAL) BY MOUTH DAILY.  90 tablet  3  . zolpidem (AMBIEN) 5 MG tablet Take 1 tablet (5 mg total) by mouth at bedtime as needed for sleep.  30 tablet  3   No current facility-administered medications for this visit.    History   Social History  . Marital Status: Married    Spouse Name: N/A    Number of Children: 4  . Years of Education: N/A   Occupational History  . 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 Smoking status: Never Smoker   . Smokeless tobacco: Not on file  . Alcohol Use: No  . Drug Use: No  . Sexual Activity: Not on file   Other Topics Concern  . Not on file   Social History Narrative  . No narrative on file    Family History  Problem Relation Age of Onset  . Breast cancer Sister     2  sisters  . Pemphigus vulgaris Mother     died at 44    Past Medical History  Diagnosis Date  . CAD (coronary artery disease)     PCI in the past / last catheterization May, 2008, medical therapy  . Hypertension   . Hyperlipemia   . Unspecified disorder of liver   . Cellulitis, leg     left  . Pulmonary nodule, right     2008 / unchanged CT scan, July, 2009  . Abdominal pain, left lower quadrant   . Adenocarcinoma of prostate   . Esophageal stricture   . Spinal stenosis   . Hyperglycemia   . Hyperthyroidism   . GERD (gastroesophageal reflux disease)   . Hx of colonic polyps   . Renal artery stenosis     Mild  . Atheroma     no penetrating ulcer, Abdominal aorta  . Inferior mesenteric artery injury     High-grade stenosis, CT scan, 2006  . Vertigo   . Diverticulum   . Incisional hernia   . Rash     Ticlid  . Cough     ACE Inhibitor, tolerates ARB  . Carotid artery disease     Doppler, 2006, minimal disease  //  Doppler, May, 2012, mild  increase velocities, 40-59% bilateral stenoses  . Renal cyst     Hyperdense cyst lower pole left kidney  . Ejection fraction     EF 65%, catheterization, 2008  . Dizziness   . Unsteady gait   . Sinus bradycardia     Past Surgical History  Procedure Laterality Date  . Esophagogastroduodenoscopy    . Cardiac catheterization    . Incisional hernia repair      Patient Active Problem List   Diagnosis Date Noted  . Dizziness   . Unsteady gait   . Sinus bradycardia   . Ejection fraction   . Carotid artery disease   . CAD (coronary artery disease)   . Pulmonary nodule, right   . Renal artery stenosis   . Rash   . Cough   . Renal cyst   . Inferior mesenteric artery injury   . ACUTE BRONCHITIS 06/29/2010  . VERTIGO 11/27/2009  . CELLULITIS, LEG, LEFT 12/30/2008  . UNSPECIFIED DISORDER OF LIVER 04/04/2008  . ABDOMINAL PAIN, LEFT LOWER QUADRANT 08/18/2007  . ADENOCARCINOMA, PROSTATE 06/15/2007  . HYPERTHYROIDISM 06/13/2007  . HYPERLIPIDEMIA 06/13/2007  . HYPERTENSION 06/13/2007  . ESOPHAGEAL STRICTURE 06/13/2007  . GERD 06/13/2007  . SPINAL STENOSIS 06/13/2007  . HYPERGLYCEMIA 06/13/2007  . COLONIC POLYPS, HX OF 06/13/2007    ROS   Patient denies fever, chills, headache, sweats, rash, change in vision, change in hearing, chest pain, cough, nausea or vomiting, urinary symptoms. All other systems are reviewed and are negative.  PHYSICAL EXAM  Patient's here with his wife. He is oriented to person time and place. Affect is normal. He looks quite good today. Head is atraumatic. Sclera and conjunctiva are normal. There is no jugulovenous distention. Lungs are clear. Respiratory effort is nonlabored. Cardiac exam reveals S1 and S2. There is a soft systolic murmur. The abdomen is soft. There is no peripheral edema.  Filed Vitals:   12/11/13 1111  BP: 124/56  Pulse: 68  Height: 5\' 10"  (1.778 m)  Weight: 160 lb (72.576 kg)     ASSESSMENT & PLAN

## 2013-12-11 NOTE — Assessment & Plan Note (Signed)
He's had no further significant dizziness. No further workup.

## 2013-12-11 NOTE — Assessment & Plan Note (Signed)
Blood pressure is controlled. No change in therapy. 

## 2013-12-11 NOTE — Patient Instructions (Signed)
Your physician has requested that you have a carotid duplex. This test is an ultrasound of the carotid arteries in your neck. It looks at blood flow through these arteries that supply the brain with blood. Allow one hour for this exam. There are no restrictions or special instructions. To be done in early July.  Your physician recommends that you continue on your current medications as directed. Please refer to the Current Medication list given to you today.  Your physician wants you to follow-up in: 6 months. You will receive a reminder letter in the mail two months in advance. If you don't receive a letter, please call our office to schedule the follow-up appointment.

## 2013-12-11 NOTE — Assessment & Plan Note (Signed)
Coronary disease is stable. He had one episode of some chest tightness earlier this year when walking in the cold. There's been no recurrence. He swims daily without difficulties. No further workup.

## 2013-12-11 NOTE — Assessment & Plan Note (Signed)
His last Doppler was June, 2014. It was stable. We will arrange for followup.

## 2013-12-13 ENCOUNTER — Telehealth: Payer: Self-pay | Admitting: *Deleted

## 2013-12-13 NOTE — Telephone Encounter (Signed)
Called pt & informed per Dr Jana Hakim that PSA result good & no need for lupron at this time.  Pt was pleased & thankful for report.

## 2013-12-28 ENCOUNTER — Ambulatory Visit (INDEPENDENT_AMBULATORY_CARE_PROVIDER_SITE_OTHER): Payer: Medicare Other | Admitting: Endocrinology

## 2013-12-28 ENCOUNTER — Encounter: Payer: Self-pay | Admitting: Endocrinology

## 2013-12-28 VITALS — BP 122/68 | HR 70 | Temp 97.8°F | Ht 70.0 in | Wt 160.0 lb

## 2013-12-28 DIAGNOSIS — E0590 Thyrotoxicosis, unspecified without thyrotoxic crisis or storm: Secondary | ICD-10-CM

## 2013-12-28 DIAGNOSIS — E785 Hyperlipidemia, unspecified: Secondary | ICD-10-CM

## 2013-12-28 DIAGNOSIS — Z23 Encounter for immunization: Secondary | ICD-10-CM | POA: Diagnosis not present

## 2013-12-28 DIAGNOSIS — I2510 Atherosclerotic heart disease of native coronary artery without angina pectoris: Secondary | ICD-10-CM

## 2013-12-28 DIAGNOSIS — Z0000 Encounter for general adult medical examination without abnormal findings: Secondary | ICD-10-CM | POA: Diagnosis not present

## 2013-12-28 LAB — T4, FREE: Free T4: 0.83 ng/dL (ref 0.60–1.60)

## 2013-12-28 LAB — TSH: TSH: 2.55 u[IU]/mL (ref 0.35–4.50)

## 2013-12-28 LAB — LIPID PANEL
CHOL/HDL RATIO: 4
Cholesterol: 161 mg/dL (ref 0–200)
HDL: 37.5 mg/dL — AB (ref >39.00)
LDL Cholesterol: 72 mg/dL (ref 0–99)
NONHDL: 123.5
Triglycerides: 258 mg/dL — ABNORMAL HIGH (ref 0.0–149.0)
VLDL: 51.6 mg/dL — AB (ref 0.0–40.0)

## 2013-12-28 NOTE — Progress Notes (Signed)
Subjective:    Patient ID: Kurt Elliott, male    DOB: [birth year], 80 y.o.   MRN: [id number]  HPI Pt returns for f/u of hyperthyroidism (dx'ed 2008; he has never had imaging, but chest CT makes no mention of the goiter; he has a slight tremor of the hands, and assoc incomnia.  He has lost a few lbs. Past Medical History  Diagnosis Date  . CAD (coronary artery disease)     PCI in the past / last catheterization May, 2008, medical therapy  . Hypertension   . Hyperlipemia   . Unspecified disorder of liver   . Cellulitis, leg     left  . Pulmonary nodule, right     2008 / unchanged CT scan, July, 2009  . Abdominal pain, left lower quadrant   . Adenocarcinoma of prostate   . Esophageal stricture   . Spinal stenosis   . Hyperglycemia   . Hyperthyroidism   . GERD (gastroesophageal reflux disease)   . Hx of colonic polyps   . Renal artery stenosis     Mild  . Atheroma     no penetrating ulcer, Abdominal aorta  . Inferior mesenteric artery injury     High-grade stenosis, CT scan, 2006  . Vertigo   . Diverticulum   . Incisional hernia   . Rash     Ticlid  . Cough     ACE Inhibitor, tolerates ARB  . Carotid artery disease     Doppler, 2006, minimal disease  //  Doppler, May, 2012, mild increase velocities, 40-59% bilateral stenoses  . Renal cyst     Hyperdense cyst lower pole left kidney  . Ejection fraction     EF 65%, catheterization, 2008  . Dizziness   . Unsteady gait   . Sinus bradycardia     Past Surgical History  Procedure Laterality Date  . Esophagogastroduodenoscopy    . Cardiac catheterization    . Incisional hernia repair      History   Social History  . Marital Status: Married    Spouse Name: N/A    Number of Children: 4  . Years of Education: N/A   Occupational History  . 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 Smoking status: Never Smoker   . Smokeless tobacco: Not on file  . Alcohol Use: No  . Drug Use: No  . Sexual  Activity: Not on file   Other Topics Concern  . Not on file   Social History Narrative  . No narrative on file    Current Outpatient Prescriptions on File Prior to Visit  Medication Sig Dispense Refill  . aspirin 81 MG tablet Take 81 mg by mouth daily.        . colestipol (COLESTID) 1 G tablet Take 5 tablets (5 g total) by mouth daily.  150 tablet  11  . ipratropium (ATROVENT) 0.06 % nasal spray Place 2 sprays into the nose as needed.       Marland Kitchen leuprolide (LUPRON) 3.75 MG injection Given by Dr Darrick Penna -- not sure of dosage      . meclizine (ANTIVERT) 25 MG tablet TAKE 1 TABLET (25 MG TOTAL) BY MOUTH EVERY 8 (EIGHT) HOURS AS NEEDED FOR DIZZINESS.  30 tablet  1  . methimazole (TAPAZOLE) 5 MG tablet Take 1 tablet (5 mg total) by mouth 3 (three) times a week.  40 tablet  1  . metoprolol tartrate (LOPRESSOR) 25 MG tablet Take 1 tablet (25 mg  total) by mouth 2 (two) times daily.  180 tablet  0  . Misc. Devices (QUAD CANE) MISC 1 Device by Does not apply route once.  1 each  0  . nitroGLYCERIN (NITROSTAT) 0.4 MG SL tablet Place 1 tablet (0.4 mg total) under the tongue every 5 (five) minutes as needed.  50 tablet  0  . olmesartan (BENICAR) 5 MG tablet Take 2 tablets (10 mg total) by mouth daily.  180 tablet  3  . ondansetron (ZOFRAN) 4 MG tablet Take 1 tablet (4 mg total) by mouth every 8 (eight) hours as needed for nausea.  20 tablet  0  . pantoprazole (PROTONIX) 40 MG tablet TAKE 1 TABLET (40 MG TOTAL) BY MOUTH DAILY.  90 tablet  3  . zolpidem (AMBIEN) 5 MG tablet Take 1 tablet (5 mg total) by mouth at bedtime as needed for sleep.  30 tablet  3   No current facility-administered medications on file prior to visit.    Allergies  Allergen Reactions  . Lisinopril     REACTION: COUGH  . Ticlopidine Hcl     REACTION: rash    Family History  Problem Relation Age of Onset  . Breast cancer Sister     2 sisters  . Pemphigus vulgaris Mother     died at 22    BP 122/68  Pulse 70   Temp(Src) 97.8 F (36.6 C) (Oral)  Ht 5\' 10"  (1.778 m)  Wt 160 lb (72.576 kg)  BMI 22.96 kg/m2  SpO2 97%  Review of Systems Denies fever and chest pain    Objective:   Physical Exam VITAL SIGNS:  See vs page GENERAL: no distress NECK: There is no palpable thyroid enlargement.  No thyroid nodule is palpable.  No palpable lymphadenopathy at the anterior neck. Skin: not diaphoretic Neuro: no tremor.     Lab Results  Component Value Date   TSH 2.55 12/28/2013  i have reviewed the following outside records:  Old ct scan makes no mention of a goiter.     Assessment & Plan:  Hyperthyroidism: well-controlled Dyslipidemia: in view that this was not a fasting specimen, this is well-controlled.   Patient is advised the following: Please continue the same medications.      Subjective:   Patient here for Medicare annual wellness visit and management of other chronic and acute problems.     Risk factors: advanced age    46 of Physicians Providing Medical Care to Patient:  See "snapshot"   Activities of Daily Living: In your present state of health, do you have any difficulty performing the following activities?:  Preparing food and eating?: No  Bathing yourself: No  Getting dressed: No  Using the toilet:No  Moving around from place to place: No  In the past year have you fallen or had a near fall?:No    Home Safety: Has smoke detector and wears seat belts. No firearms. No excess sun exposure.  Diet and Exercise  Current exercise habits: pt swims regularly Dietary issues discussed: pt reports a healthy diet   Depression Screen  Q1: Over the past two weeks, have you felt down, depressed or hopeless? no  Q2: Over the past two weeks, have you felt little interest or pleasure in doing things? no   The following portions of the patient's history were reviewed and updated as appropriate: allergies, current medications, past family history, past medical history, past social  history, past surgical history and problem list.  Past Medical History  Diagnosis Date  . CAD (coronary artery disease)     PCI in the past / last catheterization May, 2008, medical therapy  . Hypertension   . Hyperlipemia   . Unspecified disorder of liver   . Cellulitis, leg     left  . Pulmonary nodule, right     2008 / unchanged CT scan, July, 2009  . Abdominal pain, left lower quadrant   . Adenocarcinoma of prostate   . Esophageal stricture   . Spinal stenosis   . Hyperglycemia   . Hyperthyroidism   . GERD (gastroesophageal reflux disease)   . Hx of colonic polyps   . Renal artery stenosis     Mild  . Atheroma     no penetrating ulcer, Abdominal aorta  . Inferior mesenteric artery injury     High-grade stenosis, CT scan, 2006  . Vertigo   . Diverticulum   . Incisional hernia   . Rash     Ticlid  . Cough     ACE Inhibitor, tolerates ARB  . Carotid artery disease     Doppler, 2006, minimal disease  //  Doppler, May, 2012, mild increase velocities, 40-59% bilateral stenoses  . Renal cyst     Hyperdense cyst lower pole left kidney  . Ejection fraction     EF 65%, catheterization, 2008  . Dizziness   . Unsteady gait   . Sinus bradycardia     Past Surgical History  Procedure Laterality Date  . Esophagogastroduodenoscopy    . Cardiac catheterization    . Incisional hernia repair      History   Social History  . Marital Status: Married    Spouse Name: N/A    Number of Children: 4  . Years of Education: N/A   Occupational History  . 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 Smoking status: Never Smoker   . Smokeless tobacco: Not on file  . Alcohol Use: No  . Drug Use: No  . Sexual Activity: Not on file   Other Topics Concern  . Not on file   Social History Narrative  . No narrative on file    Current Outpatient Prescriptions on File Prior to Visit  Medication Sig Dispense Refill  . aspirin 81 MG tablet Take 81 mg by mouth daily.         . colestipol (COLESTID) 1 G tablet Take 5 tablets (5 g total) by mouth daily.  150 tablet  11  . ipratropium (ATROVENT) 0.06 % nasal spray Place 2 sprays into the nose as needed.       Marland Kitchen leuprolide (LUPRON) 3.75 MG injection Given by Dr Darrick Penna -- not sure of dosage      . meclizine (ANTIVERT) 25 MG tablet TAKE 1 TABLET (25 MG TOTAL) BY MOUTH EVERY 8 (EIGHT) HOURS AS NEEDED FOR DIZZINESS.  30 tablet  1  . methimazole (TAPAZOLE) 5 MG tablet Take 1 tablet (5 mg total) by mouth 3 (three) times a week.  40 tablet  1  . metoprolol tartrate (LOPRESSOR) 25 MG tablet Take 1 tablet (25 mg total) by mouth 2 (two) times daily.  180 tablet  0  . Misc. Devices (QUAD CANE) MISC 1 Device by Does not apply route once.  1 each  0  . nitroGLYCERIN (NITROSTAT) 0.4 MG SL tablet Place 1 tablet (0.4 mg total) under the tongue every 5 (five) minutes as needed.  50 tablet  0  . olmesartan (BENICAR) 5 MG tablet Take  2 tablets (10 mg total) by mouth daily.  180 tablet  3  . ondansetron (ZOFRAN) 4 MG tablet Take 1 tablet (4 mg total) by mouth every 8 (eight) hours as needed for nausea.  20 tablet  0  . pantoprazole (PROTONIX) 40 MG tablet TAKE 1 TABLET (40 MG TOTAL) BY MOUTH DAILY.  90 tablet  3  . zolpidem (AMBIEN) 5 MG tablet Take 1 tablet (5 mg total) by mouth at bedtime as needed for sleep.  30 tablet  3   No current facility-administered medications on file prior to visit.    Allergies  Allergen Reactions  . Lisinopril     REACTION: COUGH  . Ticlopidine Hcl     REACTION: rash    Family History  Problem Relation Age of Onset  . Breast cancer Sister     2 sisters  . Pemphigus vulgaris Mother     died at 23    BP 122/68  Pulse 70  Temp(Src) 97.8 F (36.6 C) (Oral)  Ht 5\' 10"  (1.778 m)  Wt 160 lb (72.576 kg)  BMI 22.96 kg/m2  SpO2 97%  Review of Systems  Denies hearing loss, and visual loss Objective:   Vision:  Sees opthalmologist Hearing: grossly normal Body mass index:  See vs  page Msk: pt slowly performs "get-up-and-go" from a sitting position Cognitive Impairment Assessment: cognition, memory and judgment appear normal.  remembers 3/3 at 5 minutes.  excellent recall.  can easily read and write a sentence.  alert and oriented x 3   Assessment:   Medicare wellness utd on preventive parameters    Plan:   During the course of the visit the patient was educated and counseled about appropriate screening and preventive services including:        Fall prevention   Diabetes screening  Nutrition counseling   Vaccines / LABS Zostavax / Pneumococcal Vaccine  today  PSA  Patient Instructions (the written plan) was given to the patient.  we discussed code status.  pt requests DNR please consider these measures for your health:  minimize alcohol.  do not use tobacco products.  have a colonoscopy at least every 10 years from age 38.    keep firearms safely stored.  always use seat belts.  have working smoke alarms in your home.  see an eye doctor and dentist regularly.  never drive under the influence of alcohol or drugs (including prescription drugs).  those with fair skin should take precautions against the sun. it is critically important to prevent falling down (keep floor areas well-lit, dry, and free of loose objects.  If you have a cane, walker, or wheelchair, you should use it, even for short trips around the house.  Also, try not to rush)

## 2013-12-28 NOTE — Patient Instructions (Addendum)
blood tests are being requested for you today.  We'll contact you with results. please consider these measures for your health:  minimize alcohol.  do not use tobacco products.  have a colonoscopy at least every 10 years from age 80.   keep firearms safely stored.  always use seat belts.  have working smoke alarms in your home.  see an eye doctor and dentist regularly.  never drive under the influence of alcohol or drugs (including prescription drugs).  those with fair skin should take precautions against the sun.   it is critically important to prevent falling down (keep floor areas well-lit, dry, and free of loose objects.  If you have a cane, walker, or wheelchair, you should use it, even for short trips around the house.  Also, try not to rush).   Please come back for a follow-up appointment in 6 months.

## 2014-01-01 DIAGNOSIS — D485 Neoplasm of uncertain behavior of skin: Secondary | ICD-10-CM | POA: Diagnosis not present

## 2014-01-01 DIAGNOSIS — H60399 Other infective otitis externa, unspecified ear: Secondary | ICD-10-CM | POA: Diagnosis not present

## 2014-01-14 ENCOUNTER — Telehealth: Payer: Self-pay | Admitting: Oncology

## 2014-01-14 NOTE — Telephone Encounter (Signed)
gv and printed appt sched and avs for pt for July thru [facility]...pt needed to r/s appt due to going out of town

## 2014-01-15 ENCOUNTER — Other Ambulatory Visit (HOSPITAL_BASED_OUTPATIENT_CLINIC_OR_DEPARTMENT_OTHER): Payer: Medicare Other

## 2014-01-15 DIAGNOSIS — C61 Malignant neoplasm of prostate: Secondary | ICD-10-CM | POA: Diagnosis not present

## 2014-01-15 LAB — COMPREHENSIVE METABOLIC PANEL (CC13)
ALK PHOS: 97 U/L (ref 40–150)
ALT: 31 U/L (ref 0–55)
AST: 44 U/L — ABNORMAL HIGH (ref 5–34)
Albumin: 3.7 g/dL (ref 3.5–5.0)
Anion Gap: 8 mEq/L (ref 3–11)
BILIRUBIN TOTAL: 0.75 mg/dL (ref 0.20–1.20)
BUN: 18.6 mg/dL (ref 7.0–26.0)
CO2: 25 mEq/L (ref 22–29)
CREATININE: 1.2 mg/dL (ref 0.7–1.3)
Calcium: 9.8 mg/dL (ref 8.4–10.4)
Chloride: 107 mEq/L (ref 98–109)
Glucose: 133 mg/dl (ref 70–140)
Potassium: 4.2 mEq/L (ref 3.5–5.1)
Sodium: 140 mEq/L (ref 136–145)
Total Protein: 7 g/dL (ref 6.4–8.3)

## 2014-01-15 LAB — CBC WITH DIFFERENTIAL/PLATELET
BASO%: 0.4 % (ref 0.0–2.0)
Basophils Absolute: 0 10*3/uL (ref 0.0–0.1)
EOS%: 4.5 % (ref 0.0–7.0)
Eosinophils Absolute: 0.2 10*3/uL (ref 0.0–0.5)
HEMATOCRIT: 34.6 % — AB (ref 38.4–49.9)
HGB: 12 g/dL — ABNORMAL LOW (ref 13.0–17.1)
LYMPH#: 1.2 10*3/uL (ref 0.9–3.3)
LYMPH%: 21.4 % (ref 14.0–49.0)
MCH: 33.1 pg (ref 27.2–33.4)
MCHC: 34.7 g/dL (ref 32.0–36.0)
MCV: 95.2 fL (ref 79.3–98.0)
MONO#: 0.3 10*3/uL (ref 0.1–0.9)
MONO%: 5.6 % (ref 0.0–14.0)
NEUT#: 3.7 10*3/uL (ref 1.5–6.5)
NEUT%: 68.1 % (ref 39.0–75.0)
PLATELETS: 171 10*3/uL (ref 140–400)
RBC: 3.63 10*6/uL — AB (ref 4.20–5.82)
RDW: 12.8 % (ref 11.0–14.6)
WBC: 5.5 10*3/uL (ref 4.0–10.3)

## 2014-01-16 ENCOUNTER — Other Ambulatory Visit: Payer: Self-pay | Admitting: Cardiology

## 2014-01-16 LAB — PSA: PSA: 0.12 ng/mL (ref <=4.00)

## 2014-01-16 LAB — TESTOSTERONE: Testosterone: 20 ng/dL — ABNORMAL LOW (ref 300–890)

## 2014-01-30 ENCOUNTER — Ambulatory Visit (HOSPITAL_COMMUNITY): Payer: Medicare Other | Attending: Cardiovascular Disease | Admitting: Cardiology

## 2014-01-30 DIAGNOSIS — I779 Disorder of arteries and arterioles, unspecified: Secondary | ICD-10-CM | POA: Diagnosis not present

## 2014-01-30 DIAGNOSIS — I6529 Occlusion and stenosis of unspecified carotid artery: Secondary | ICD-10-CM | POA: Diagnosis not present

## 2014-01-30 DIAGNOSIS — I739 Peripheral vascular disease, unspecified: Secondary | ICD-10-CM

## 2014-01-30 NOTE — Progress Notes (Signed)
Carotid duplex performed 

## 2014-01-31 DIAGNOSIS — H60399 Other infective otitis externa, unspecified ear: Secondary | ICD-10-CM | POA: Diagnosis not present

## 2014-02-01 ENCOUNTER — Encounter: Payer: Self-pay | Admitting: Cardiology

## 2014-02-06 ENCOUNTER — Other Ambulatory Visit: Payer: Self-pay | Admitting: Endocrinology

## 2014-02-06 ENCOUNTER — Telehealth: Payer: Self-pay | Admitting: Cardiology

## 2014-02-06 NOTE — Telephone Encounter (Signed)
New message    Patient wants to know does he need to take a statin drug if so will Dr. Ron Parker called one in to his pharmacy.

## 2014-02-06 NOTE — Telephone Encounter (Signed)
Pt had lipid profile done on 12/28/13 in Dr. Cordelia Pen office.Pt states that  PCP did not recommends anything. Pt would like to know if Dr. Ron Parker thinks he needs to take statins. Pt is aware that Dr. Ron Parker is on vacation and will return the week of the 28 th. Pt states it will be fine to wait until Dr. Ron Parker returns.

## 2014-02-07 NOTE — Telephone Encounter (Signed)
**Note De-identified  Obfuscation** The pt is advised and he verbalized understanding. 

## 2014-02-07 NOTE — Telephone Encounter (Signed)
Would not add a statin. Dr. Loanne Drilling knows the history of his cholesterol and meds used. Loanne Drilling is an expert in this area and knows the recent labs. No changes.

## 2014-02-07 NOTE — Telephone Encounter (Signed)
**Note De-Identified  Obfuscation** The pts lab results from 12/28/13 is in his chart. Please advise.

## 2014-02-15 ENCOUNTER — Telehealth: Payer: Self-pay | Admitting: Endocrinology

## 2014-02-15 MED ORDER — ZALEPLON 5 MG PO CAPS: 5.0000 mg | ORAL_CAPSULE | Freq: Every evening | ORAL | Status: DC | PRN

## 2014-02-15 NOTE — Telephone Encounter (Signed)
i printed rx for sonata, a shorter-acting pill, on a trial basis

## 2014-02-15 NOTE — Telephone Encounter (Signed)
Pt's wife advised.

## 2014-02-15 NOTE — Telephone Encounter (Signed)
Patient stated he is taking1/2 tab of zolpidem, he only half of night,  When he take a whole tablet he has such a hangover next day.  Could you find another med where he could sleep the whole night without a hangover the next day.  Please advise

## 2014-02-15 NOTE — Telephone Encounter (Signed)
See below and please advise, Thanks!  

## 2014-02-26 ENCOUNTER — Other Ambulatory Visit: Payer: Medicare Other

## 2014-03-12 ENCOUNTER — Other Ambulatory Visit (HOSPITAL_BASED_OUTPATIENT_CLINIC_OR_DEPARTMENT_OTHER): Payer: Medicare Other

## 2014-03-12 DIAGNOSIS — C61 Malignant neoplasm of prostate: Secondary | ICD-10-CM | POA: Diagnosis not present

## 2014-03-12 LAB — CBC WITH DIFFERENTIAL/PLATELET
BASO%: 0.5 % (ref 0.0–2.0)
Basophils Absolute: 0 10e3/uL (ref 0.0–0.1)
EOS%: 5.2 % (ref 0.0–7.0)
Eosinophils Absolute: 0.3 10e3/uL (ref 0.0–0.5)
HCT: 34.2 % — ABNORMAL LOW (ref 38.4–49.9)
HGB: 11.9 g/dL — ABNORMAL LOW (ref 13.0–17.1)
LYMPH%: 22.1 % (ref 14.0–49.0)
MCH: 32.8 pg (ref 27.2–33.4)
MCHC: 34.8 g/dL (ref 32.0–36.0)
MCV: 94.4 fL (ref 79.3–98.0)
MONO#: 0.4 10e3/uL (ref 0.1–0.9)
MONO%: 6.1 % (ref 0.0–14.0)
NEUT#: 4.1 10e3/uL (ref 1.5–6.5)
NEUT%: 66.1 % (ref 39.0–75.0)
Platelets: 184 10e3/uL (ref 140–400)
RBC: 3.62 10e6/uL — ABNORMAL LOW (ref 4.20–5.82)
RDW: 12.6 % (ref 11.0–14.6)
WBC: 6.1 10e3/uL (ref 4.0–10.3)
lymph#: 1.4 10e3/uL (ref 0.9–3.3)

## 2014-03-12 LAB — COMPREHENSIVE METABOLIC PANEL (CC13)
ALT: 23 U/L (ref 0–55)
AST: 36 U/L — ABNORMAL HIGH (ref 5–34)
Albumin: 3.7 g/dL (ref 3.5–5.0)
Alkaline Phosphatase: 85 U/L (ref 40–150)
Anion Gap: 9 meq/L (ref 3–11)
BUN: 18 mg/dL (ref 7.0–26.0)
CO2: 24 meq/L (ref 22–29)
Calcium: 10 mg/dL (ref 8.4–10.4)
Chloride: 109 meq/L (ref 98–109)
Creatinine: 1.1 mg/dL (ref 0.7–1.3)
Glucose: 110 mg/dL (ref 70–140)
Potassium: 4.3 meq/L (ref 3.5–5.1)
Sodium: 141 meq/L (ref 136–145)
Total Bilirubin: 0.76 mg/dL (ref 0.20–1.20)
Total Protein: 7.2 g/dL (ref 6.4–8.3)

## 2014-03-13 LAB — PSA: PSA: 0.34 ng/mL (ref <=4.00)

## 2014-03-13 LAB — TESTOSTERONE: Testosterone: 107 ng/dL — ABNORMAL LOW (ref 300–890)

## 2014-03-16 ENCOUNTER — Other Ambulatory Visit: Payer: Self-pay | Admitting: Oncology

## 2014-03-17 ENCOUNTER — Other Ambulatory Visit: Payer: Self-pay | Admitting: Oncology

## 2014-03-18 ENCOUNTER — Other Ambulatory Visit: Payer: Self-pay | Admitting: Oncology

## 2014-03-18 ENCOUNTER — Other Ambulatory Visit: Payer: Self-pay | Admitting: *Deleted

## 2014-03-19 ENCOUNTER — Ambulatory Visit (HOSPITAL_BASED_OUTPATIENT_CLINIC_OR_DEPARTMENT_OTHER): Payer: Medicare Other

## 2014-03-19 VITALS — BP 152/55 | HR 68 | Temp 97.7°F

## 2014-03-19 DIAGNOSIS — C61 Malignant neoplasm of prostate: Secondary | ICD-10-CM

## 2014-03-19 DIAGNOSIS — Z5111 Encounter for antineoplastic chemotherapy: Secondary | ICD-10-CM | POA: Diagnosis not present

## 2014-03-19 MED ORDER — LEUPROLIDE ACETATE (3 MONTH) 22.5 MG IM KIT
22.5000 mg | PACK | Freq: Once | INTRAMUSCULAR | Status: AC
  Administered 2014-03-19: 22.5 mg via INTRAMUSCULAR
  Filled 2014-03-19: qty 22.5

## 2014-03-25 ENCOUNTER — Telehealth: Payer: Self-pay | Admitting: Endocrinology

## 2014-03-25 DIAGNOSIS — M25552 Pain in left hip: Principal | ICD-10-CM

## 2014-03-25 DIAGNOSIS — M25551 Pain in right hip: Secondary | ICD-10-CM | POA: Insufficient documentation

## 2014-03-25 NOTE — Telephone Encounter (Signed)
done

## 2014-03-25 NOTE — Telephone Encounter (Signed)
Patients daughter [name] called and would like for her father to receive physical therapy on his hips She states he cant walk a block  Please advise   Thank You

## 2014-03-25 NOTE — Telephone Encounter (Signed)
See below and please advise, Thanks!  

## 2014-04-04 DIAGNOSIS — L570 Actinic keratosis: Secondary | ICD-10-CM | POA: Diagnosis not present

## 2014-04-04 DIAGNOSIS — L259 Unspecified contact dermatitis, unspecified cause: Secondary | ICD-10-CM | POA: Diagnosis not present

## 2014-04-09 ENCOUNTER — Other Ambulatory Visit: Payer: Medicare Other

## 2014-04-11 ENCOUNTER — Ambulatory Visit (INDEPENDENT_AMBULATORY_CARE_PROVIDER_SITE_OTHER): Payer: Medicare Other

## 2014-04-11 ENCOUNTER — Ambulatory Visit: Payer: Medicare Other

## 2014-04-11 DIAGNOSIS — Z23 Encounter for immunization: Secondary | ICD-10-CM | POA: Diagnosis not present

## 2014-04-16 ENCOUNTER — Ambulatory Visit: Payer: Medicare Other

## 2014-04-22 ENCOUNTER — Other Ambulatory Visit: Payer: Self-pay | Admitting: Gastroenterology

## 2014-04-23 ENCOUNTER — Other Ambulatory Visit: Payer: Medicare Other

## 2014-04-24 ENCOUNTER — Other Ambulatory Visit (HOSPITAL_BASED_OUTPATIENT_CLINIC_OR_DEPARTMENT_OTHER): Payer: Medicare Other

## 2014-04-24 DIAGNOSIS — C61 Malignant neoplasm of prostate: Secondary | ICD-10-CM | POA: Diagnosis not present

## 2014-04-24 LAB — CBC WITH DIFFERENTIAL/PLATELET
BASO%: 0.4 % (ref 0.0–2.0)
BASOS ABS: 0 10*3/uL (ref 0.0–0.1)
EOS ABS: 0.2 10*3/uL (ref 0.0–0.5)
EOS%: 4.4 % (ref 0.0–7.0)
HCT: 34.7 % — ABNORMAL LOW (ref 38.4–49.9)
HEMOGLOBIN: 11.9 g/dL — AB (ref 13.0–17.1)
LYMPH#: 1.3 10*3/uL (ref 0.9–3.3)
LYMPH%: 23.9 % (ref 14.0–49.0)
MCH: 32.8 pg (ref 27.2–33.4)
MCHC: 34.2 g/dL (ref 32.0–36.0)
MCV: 95.9 fL (ref 79.3–98.0)
MONO#: 0.4 10*3/uL (ref 0.1–0.9)
MONO%: 7.4 % (ref 0.0–14.0)
NEUT%: 63.9 % (ref 39.0–75.0)
NEUTROS ABS: 3.6 10*3/uL (ref 1.5–6.5)
Platelets: 171 10*3/uL (ref 140–400)
RBC: 3.62 10*6/uL — AB (ref 4.20–5.82)
RDW: 12.5 % (ref 11.0–14.6)
WBC: 5.5 10*3/uL (ref 4.0–10.3)

## 2014-04-24 LAB — COMPREHENSIVE METABOLIC PANEL (CC13)
ALBUMIN: 3.7 g/dL (ref 3.5–5.0)
ALK PHOS: 94 U/L (ref 40–150)
ALT: 27 U/L (ref 0–55)
AST: 44 U/L — AB (ref 5–34)
Anion Gap: 6 mEq/L (ref 3–11)
BUN: 19.2 mg/dL (ref 7.0–26.0)
CO2: 26 mEq/L (ref 22–29)
Calcium: 9.9 mg/dL (ref 8.4–10.4)
Chloride: 111 mEq/L — ABNORMAL HIGH (ref 98–109)
Creatinine: 1.1 mg/dL (ref 0.7–1.3)
Glucose: 106 mg/dl (ref 70–140)
POTASSIUM: 4.1 meq/L (ref 3.5–5.1)
SODIUM: 142 meq/L (ref 136–145)
Total Bilirubin: 1.08 mg/dL (ref 0.20–1.20)
Total Protein: 7.2 g/dL (ref 6.4–8.3)

## 2014-04-25 ENCOUNTER — Other Ambulatory Visit: Payer: Self-pay | Admitting: Oncology

## 2014-04-25 LAB — PSA: PSA: 0.19 ng/mL (ref <=4.00)

## 2014-04-25 LAB — TESTOSTERONE: TESTOSTERONE: 51 ng/dL — AB (ref 300–890)

## 2014-04-30 ENCOUNTER — Other Ambulatory Visit: Payer: Self-pay | Admitting: Cardiology

## 2014-05-07 DIAGNOSIS — H6092 Unspecified otitis externa, left ear: Secondary | ICD-10-CM | POA: Diagnosis not present

## 2014-05-07 DIAGNOSIS — H628X2 Other disorders of left external ear in diseases classified elsewhere: Secondary | ICD-10-CM | POA: Diagnosis not present

## 2014-05-21 ENCOUNTER — Other Ambulatory Visit: Payer: Medicare Other

## 2014-06-04 ENCOUNTER — Other Ambulatory Visit (HOSPITAL_BASED_OUTPATIENT_CLINIC_OR_DEPARTMENT_OTHER): Payer: Medicare Other

## 2014-06-04 DIAGNOSIS — C61 Malignant neoplasm of prostate: Secondary | ICD-10-CM

## 2014-06-04 LAB — COMPREHENSIVE METABOLIC PANEL (CC13)
ALT: 31 U/L (ref 0–55)
ANION GAP: 6 meq/L (ref 3–11)
AST: 40 U/L — ABNORMAL HIGH (ref 5–34)
Albumin: 3.8 g/dL (ref 3.5–5.0)
Alkaline Phosphatase: 93 U/L (ref 40–150)
BUN: 16.8 mg/dL (ref 7.0–26.0)
CO2: 24 meq/L (ref 22–29)
CREATININE: 1 mg/dL (ref 0.7–1.3)
Calcium: 9.7 mg/dL (ref 8.4–10.4)
Chloride: 110 mEq/L — ABNORMAL HIGH (ref 98–109)
Glucose: 129 mg/dl (ref 70–140)
Potassium: 3.9 mEq/L (ref 3.5–5.1)
Sodium: 141 mEq/L (ref 136–145)
Total Bilirubin: 0.7 mg/dL (ref 0.20–1.20)
Total Protein: 7 g/dL (ref 6.4–8.3)

## 2014-06-04 LAB — CBC WITH DIFFERENTIAL/PLATELET
BASO%: 0.6 % (ref 0.0–2.0)
BASOS ABS: 0 10*3/uL (ref 0.0–0.1)
EOS ABS: 0.2 10*3/uL (ref 0.0–0.5)
EOS%: 3.8 % (ref 0.0–7.0)
HCT: 33.7 % — ABNORMAL LOW (ref 38.4–49.9)
HGB: 11.8 g/dL — ABNORMAL LOW (ref 13.0–17.1)
LYMPH%: 19.7 % (ref 14.0–49.0)
MCH: 32.8 pg (ref 27.2–33.4)
MCHC: 34.9 g/dL (ref 32.0–36.0)
MCV: 94 fL (ref 79.3–98.0)
MONO#: 0.3 10*3/uL (ref 0.1–0.9)
MONO%: 5 % (ref 0.0–14.0)
NEUT%: 70.9 % (ref 39.0–75.0)
NEUTROS ABS: 4.2 10*3/uL (ref 1.5–6.5)
PLATELETS: 162 10*3/uL (ref 140–400)
RBC: 3.58 10*6/uL — AB (ref 4.20–5.82)
RDW: 12.5 % (ref 11.0–14.6)
WBC: 5.9 10*3/uL (ref 4.0–10.3)
lymph#: 1.2 10*3/uL (ref 0.9–3.3)

## 2014-06-05 DIAGNOSIS — L570 Actinic keratosis: Secondary | ICD-10-CM | POA: Diagnosis not present

## 2014-06-05 DIAGNOSIS — B351 Tinea unguium: Secondary | ICD-10-CM | POA: Diagnosis not present

## 2014-06-05 DIAGNOSIS — L309 Dermatitis, unspecified: Secondary | ICD-10-CM | POA: Diagnosis not present

## 2014-06-05 LAB — PSA: PSA: 0.23 ng/mL (ref <=4.00)

## 2014-06-05 LAB — TESTOSTERONE: Testosterone: 25 ng/dL — ABNORMAL LOW (ref 300–890)

## 2014-06-10 ENCOUNTER — Other Ambulatory Visit: Payer: Self-pay | Admitting: Oncology

## 2014-06-12 ENCOUNTER — Encounter: Payer: Self-pay | Admitting: Cardiology

## 2014-06-12 ENCOUNTER — Ambulatory Visit (INDEPENDENT_AMBULATORY_CARE_PROVIDER_SITE_OTHER): Payer: Medicare Other | Admitting: Cardiology

## 2014-06-12 VITALS — BP 110/60 | HR 61 | Ht 70.0 in | Wt 162.0 lb

## 2014-06-12 DIAGNOSIS — I10 Essential (primary) hypertension: Secondary | ICD-10-CM | POA: Diagnosis not present

## 2014-06-12 DIAGNOSIS — I2510 Atherosclerotic heart disease of native coronary artery without angina pectoris: Secondary | ICD-10-CM

## 2014-06-12 DIAGNOSIS — I25118 Atherosclerotic heart disease of native coronary artery with other forms of angina pectoris: Secondary | ICD-10-CM

## 2014-06-12 DIAGNOSIS — I6529 Occlusion and stenosis of unspecified carotid artery: Secondary | ICD-10-CM | POA: Diagnosis not present

## 2014-06-12 DIAGNOSIS — R001 Bradycardia, unspecified: Secondary | ICD-10-CM | POA: Diagnosis not present

## 2014-06-12 MED ORDER — ISOSORBIDE MONONITRATE ER 30 MG PO TB24: 30.0000 mg | ORAL_TABLET | Freq: Every day | ORAL | Status: DC

## 2014-06-12 MED ORDER — NITROGLYCERIN 0.4 MG SL SUBL
0.4000 mg | SUBLINGUAL_TABLET | SUBLINGUAL | Status: DC | PRN
Start: 2014-06-12 — End: 2014-11-12

## 2014-06-12 NOTE — Assessment & Plan Note (Signed)
Heart rate is stable on his current dose of beta blocker.

## 2014-06-12 NOTE — Assessment & Plan Note (Signed)
Blood pressure today is 110/60. No change in therapy.

## 2014-06-12 NOTE — Assessment & Plan Note (Addendum)
The patient has known coronary disease. He had a PCI in the past. Last catheterization was May, 2008. I believe he is having some infrequent exertional angina when climbing stairs. He does not sense this when he is swimming. Bradycardia has been an issue over time. I'm not able to increase the dose of his beta blocker. I decided to add a very small dose of Imdur. If this causes him any side effects, he will stop the drug. Otherwise I will see him back to see how he is responded. I'm not inclined to recommend cardiac catheterization at this time. I will consider a stress nuclear scan. This would be done for risk stratification as I suspect that his symptoms are from angina.I've also given him a new prescription for nitroglycerin. He knows to take this if he has prolonged pain.

## 2014-06-12 NOTE — Assessment & Plan Note (Signed)
His lipids have been treated carefully over the years.

## 2014-06-12 NOTE — Patient Instructions (Signed)
**Note De-Identified  Obfuscation** Your physician has recommended you make the following change in your medication: start taking Imdur 30 mg daily and we have refilled you Nitroglycerin (please take as directed on bottle)  Your physician recommends that you schedule a follow-up appointment in: 4 weeks

## 2014-06-12 NOTE — Progress Notes (Signed)
Patient ID: Kurt [place], male   DOB: 1921-06-09, 80 y.o.   MRN: [id number]    HPI Patient is seen today to follow-up coronary disease. Overall he is doing well. He swims multiple laps in the pool on a very regular basis with no symptoms. Sometimes after climbing stairs, he has some chest heaviness. This may persist for a short period of time and then resolves. He has not tried nitroglycerin. He is not having any recurrent dizziness.  Allergies  Allergen Reactions  . Lisinopril     REACTION: COUGH  . Ticlopidine Hcl     REACTION: rash    Current Outpatient Prescriptions  Medication Sig Dispense Refill  . aspirin 81 MG tablet Take 81 mg by mouth daily.      . colestipol (COLESTID) 1 G tablet Take 5 tablets (5 g total) by mouth daily. 150 tablet 11  . ipratropium (ATROVENT) 0.06 % nasal spray Place 2 sprays into the nose as needed.     Marland Kitchen leuprolide (LUPRON) 3.75 MG injection Given by Dr Darrick Penna -- not sure of dosage    . methimazole (TAPAZOLE) 5 MG tablet TAKE 1 TABLET BY MOUTH 3 TIMES A WEEK 40 tablet 1  . metoprolol tartrate (LOPRESSOR) 25 MG tablet TAKE 1 TABLET (25 MG TOTAL) BY MOUTH 2 (TWO) TIMES DAILY. 180 tablet 0  . Misc. Devices (QUAD CANE) MISC 1 Device by Does not apply route once. 1 each 0  . nitroGLYCERIN (NITROSTAT) 0.4 MG SL tablet Place 1 tablet (0.4 mg total) under the tongue every 5 (five) minutes as needed. 25 tablet 3  . olmesartan (BENICAR) 5 MG tablet Take 2 tablets (10 mg total) by mouth daily. 180 tablet 3  . pantoprazole (PROTONIX) 40 MG tablet TAKE 1 TABLET (40 MG TOTAL) BY MOUTH DAILY. 90 tablet 3  . triamcinolone cream (KENALOG) 0.1 %   3  . zaleplon (SONATA) 5 MG capsule Take 1 capsule (5 mg total) by mouth at bedtime as needed for sleep. 10 capsule 0  . zolpidem (AMBIEN) 5 MG tablet Take 1 tablet (5 mg total) by mouth at bedtime as needed for sleep. 30 tablet 3  . isosorbide mononitrate (IMDUR) 30 MG 24 hr tablet Take 1 tablet (30 mg total) by mouth daily.  30 tablet 3   No current facility-administered medications for this visit.    History   Social History  . Marital Status: Married    Spouse Name: N/A    Number of Children: 4  . Years of Education: N/A   Occupational History  . 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 Smoking status: Never Smoker   . Smokeless tobacco: Not on file  . Alcohol Use: No  . Drug Use: No  . Sexual Activity: Not on file   Other Topics Concern  . Not on file   Social History Narrative    Family History  Problem Relation Age of Onset  . Breast cancer Sister     2 sisters  . Pemphigus vulgaris Mother     died at 109    Past Medical History  Diagnosis Date  . CAD (coronary artery disease)     PCI in the past / last catheterization May, 2008, medical therapy  . Hypertension   . Hyperlipemia   . Unspecified disorder of liver   . Cellulitis, leg     left  . Pulmonary nodule, right     2008 / unchanged CT scan, July, 2009  .  Abdominal pain, left lower quadrant   . Adenocarcinoma of prostate   . Esophageal stricture   . Spinal stenosis   . Hyperglycemia   . Hyperthyroidism   . GERD (gastroesophageal reflux disease)   . Hx of colonic polyps   . Renal artery stenosis     Mild  . Atheroma     no penetrating ulcer, Abdominal aorta  . Inferior mesenteric artery injury     High-grade stenosis, CT scan, 2006  . Vertigo   . Diverticulum   . Incisional hernia   . Rash     Ticlid  . Cough     ACE Inhibitor, tolerates ARB  . Carotid artery disease     Doppler, 2006, minimal disease  //  Doppler, May, 2012, mild increase velocities, 40-59% bilateral stenoses  . Renal cyst     Hyperdense cyst lower pole left kidney  . Ejection fraction     EF 65%, catheterization, 2008  . Dizziness   . Unsteady gait   . Sinus bradycardia     Past Surgical History  Procedure Laterality Date  . Esophagogastroduodenoscopy    . Cardiac catheterization    . Incisional hernia repair        Patient Active Problem List   Diagnosis Date Noted  . Hip pain, bilateral 03/25/2014  . Dizziness   . Unsteady gait   . Sinus bradycardia   . Ejection fraction   . Carotid artery disease   . CAD (coronary artery disease)   . Pulmonary nodule, right   . Renal artery stenosis   . Rash   . Cough   . Renal cyst   . Inferior mesenteric artery injury   . ACUTE BRONCHITIS 06/29/2010  . VERTIGO 11/27/2009  . CELLULITIS, LEG, LEFT 12/30/2008  . UNSPECIFIED DISORDER OF LIVER 04/04/2008  . ABDOMINAL PAIN, LEFT LOWER QUADRANT 08/18/2007  . ADENOCARCINOMA, PROSTATE 06/15/2007  . HYPERTHYROIDISM 06/13/2007  . HYPERLIPIDEMIA 06/13/2007  . Essential hypertension 06/13/2007  . ESOPHAGEAL STRICTURE 06/13/2007  . GERD 06/13/2007  . SPINAL STENOSIS 06/13/2007  . HYPERGLYCEMIA 06/13/2007  . COLONIC POLYPS, HX OF 06/13/2007    ROS  Patient denies fever, chills, headache, sweats, rash, change in vision, change in hearing, cough, nausea or vomiting, urinary symptoms. All other systems are reviewed and are negative.  PHYSICAL EXAM Patient is oriented to person time and place. Affect is normal. He is here with his wife. Head is atraumatic. Sclera and conjunctiva are normal. There is no jugulovenous distention. Lungs are clear. Respiratory effort is nonlabored. Cardiac exam reveals S1 and S2. There is a crescendo decrescendo systolic murmur. Abdomen is soft. There is no peripheral edema. There are no musculoskeletal deformities. There are no skin rashes.  Filed Vitals:   06/12/14 1119  BP: 110/60  Pulse: 61  Height: 5\' 10"  (1.778 m)  Weight: 162 lb (73.483 kg)   EKG is done today and reviewed by me. Heart rate is 61. There are old inferior Q waves. There is no change from the past.  ASSESSMENT & PLAN

## 2014-07-02 ENCOUNTER — Other Ambulatory Visit: Payer: Medicare Other

## 2014-07-02 ENCOUNTER — Ambulatory Visit: Payer: Medicare Other | Admitting: Oncology

## 2014-07-12 ENCOUNTER — Ambulatory Visit (INDEPENDENT_AMBULATORY_CARE_PROVIDER_SITE_OTHER): Payer: Medicare Other | Admitting: Cardiology

## 2014-07-12 ENCOUNTER — Encounter: Payer: Self-pay | Admitting: Cardiology

## 2014-07-12 VITALS — BP 132/66 | HR 63 | Ht 70.0 in | Wt 162.0 lb

## 2014-07-12 DIAGNOSIS — I6529 Occlusion and stenosis of unspecified carotid artery: Secondary | ICD-10-CM

## 2014-07-12 DIAGNOSIS — I10 Essential (primary) hypertension: Secondary | ICD-10-CM

## 2014-07-12 DIAGNOSIS — I25118 Atherosclerotic heart disease of native coronary artery with other forms of angina pectoris: Secondary | ICD-10-CM | POA: Diagnosis not present

## 2014-07-12 NOTE — Progress Notes (Signed)
Patient ID: Kurt [place], male   DOB: [birth year], 80 y.o.   MRN: [id number]    HPI Patient is seen today to follow-up coronary disease. When I saw him last June 12, 2014, he had some mild exertional chest pain. We gave him a trial of Imdur. He had significant headache and stopped it. He has symptoms only with walking exertion. He is still swimming and not having problems with this.  Allergies  Allergen Reactions  . Isosorbide Other (See Comments)    "sever Headache" per patient  . Lisinopril     REACTION: COUGH  . Ticlopidine Hcl     REACTION: rash    Current Outpatient Prescriptions  Medication Sig Dispense Refill  . aspirin 81 MG tablet Take 81 mg by mouth daily.      . colestipol (COLESTID) 1 G tablet Take 5 tablets (5 g total) by mouth daily. 150 tablet 11  . ipratropium (ATROVENT) 0.06 % nasal spray Place 2 sprays into the nose as needed.     Marland Kitchen leuprolide (LUPRON) 3.75 MG injection Given by Dr Darrick Penna -- not sure of dosage    . methimazole (TAPAZOLE) 5 MG tablet TAKE 1 TABLET BY MOUTH 3 TIMES A WEEK 40 tablet 1  . metoprolol tartrate (LOPRESSOR) 25 MG tablet TAKE 1 TABLET (25 MG TOTAL) BY MOUTH 2 (TWO) TIMES DAILY. 180 tablet 0  . Misc. Devices (QUAD CANE) MISC 1 Device by Does not apply route once. 1 each 0  . nitroGLYCERIN (NITROSTAT) 0.4 MG SL tablet Place 1 tablet (0.4 mg total) under the tongue every 5 (five) minutes as needed. 25 tablet 3  . olmesartan (BENICAR) 5 MG tablet Take 2 tablets (10 mg total) by mouth daily. 180 tablet 3  . pantoprazole (PROTONIX) 40 MG tablet TAKE 1 TABLET (40 MG TOTAL) BY MOUTH DAILY. 90 tablet 3  . triamcinolone cream (KENALOG) 0.1 %   3   No current facility-administered medications for this visit.    History   Social History  . Marital Status: Married    Spouse Name: N/A    Number of Children: 4  . Years of Education: N/A   Occupational History  . 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 Smoking status:  Never Smoker   . Smokeless tobacco: Not on file  . Alcohol Use: No  . Drug Use: No  . Sexual Activity: Not on file   Other Topics Concern  . Not on file   Social History Narrative    Family History  Problem Relation Age of Onset  . Breast cancer Sister     2 sisters  . Pemphigus vulgaris Mother     died at 39    Past Medical History  Diagnosis Date  . CAD (coronary artery disease)     PCI in the past / last catheterization May, 2008, medical therapy  . Hypertension   . Hyperlipemia   . Unspecified disorder of liver   . Cellulitis, leg     left  . Pulmonary nodule, right     2008 / unchanged CT scan, July, 2009  . Abdominal pain, left lower quadrant   . Adenocarcinoma of prostate   . Esophageal stricture   . Spinal stenosis   . Hyperglycemia   . Hyperthyroidism   . GERD (gastroesophageal reflux disease)   . Hx of colonic polyps   . Renal artery stenosis     Mild  . Atheroma     no penetrating ulcer,  Abdominal aorta  . Inferior mesenteric artery injury     High-grade stenosis, CT scan, 2006  . Vertigo   . Diverticulum   . Incisional hernia   . Rash     Ticlid  . Cough     ACE Inhibitor, tolerates ARB  . Carotid artery disease     Doppler, 2006, minimal disease  //  Doppler, May, 2012, mild increase velocities, 40-59% bilateral stenoses  . Renal cyst     Hyperdense cyst lower pole left kidney  . Ejection fraction     EF 65%, catheterization, 2008  . Dizziness   . Unsteady gait   . Sinus bradycardia     Past Surgical History  Procedure Laterality Date  . Esophagogastroduodenoscopy    . Cardiac catheterization    . Incisional hernia repair      Patient Active Problem List   Diagnosis Date Noted  . Hip pain, bilateral 03/25/2014  . Dizziness   . Unsteady gait   . Sinus bradycardia   . Ejection fraction   . Carotid artery disease   . CAD (coronary artery disease)   . Pulmonary nodule, right   . Renal artery stenosis   . Rash   . Cough   .  Renal cyst   . Inferior mesenteric artery injury   . ACUTE BRONCHITIS 06/29/2010  . VERTIGO 11/27/2009  . CELLULITIS, LEG, LEFT 12/30/2008  . UNSPECIFIED DISORDER OF LIVER 04/04/2008  . ABDOMINAL PAIN, LEFT LOWER QUADRANT 08/18/2007  . ADENOCARCINOMA, PROSTATE 06/15/2007  . HYPERTHYROIDISM 06/13/2007  . HYPERLIPIDEMIA 06/13/2007  . Essential hypertension 06/13/2007  . ESOPHAGEAL STRICTURE 06/13/2007  . GERD 06/13/2007  . SPINAL STENOSIS 06/13/2007  . HYPERGLYCEMIA 06/13/2007  . COLONIC POLYPS, HX OF 06/13/2007    ROS  Patient denies fever, chills, headache, sweats, rash, cough, nausea or vomiting, urinary symptoms. All other systems are reviewed and are negative.  PHYSICAL EXAM  patient is oriented to person time and place. Affect is normal. He is fatigued as he is working hard to help take care of his wife. She is in failing health with decreased hearing and decreased vision and back pain and depression. Head is atraumatic. Sclera and conjunctiva are normal. There is no jugular venous distention. Lungs are clear. Respiratory effort is not labored. He has kyphosis of thoracic spine. Cardiac exam reveals S1 and S2 with a systolic murmur. The abdomen is soft. There is no peripheral edema.  Filed Vitals:   07/12/14 1044  BP: 132/66  Pulse: 63  Height: 5\' 10"  (1.778 m)  Weight: 162 lb (73.483 kg)  SpO2: 97%     ASSESSMENT & PLAN

## 2014-07-12 NOTE — Assessment & Plan Note (Signed)
Blood pressures controlled. No change in therapy. 

## 2014-07-12 NOTE — Assessment & Plan Note (Signed)
He had significant headache from Imdur. For now we will just use when necessary nitroglycerin. If he has any increasing symptoms, I will try a small dose of a calcium blocker. If he has more significant symptoms he is a candidate for repeat catheterization.

## 2014-07-12 NOTE — Patient Instructions (Addendum)
Your physician recommends that you continue on your current medications as directed. Please refer to the Current Medication list given to you today.  Your physician recommends that you schedule a follow-up appointment in: 3 months with Dr. Ron Parker.

## 2014-07-14 ENCOUNTER — Other Ambulatory Visit: Payer: Self-pay | Admitting: Endocrinology

## 2014-07-16 ENCOUNTER — Ambulatory Visit (HOSPITAL_BASED_OUTPATIENT_CLINIC_OR_DEPARTMENT_OTHER): Payer: Medicare Other | Admitting: Lab

## 2014-07-16 ENCOUNTER — Other Ambulatory Visit: Payer: Self-pay | Admitting: Emergency Medicine

## 2014-07-16 ENCOUNTER — Ambulatory Visit (HOSPITAL_BASED_OUTPATIENT_CLINIC_OR_DEPARTMENT_OTHER): Payer: Medicare Other | Admitting: Oncology

## 2014-07-16 ENCOUNTER — Telehealth: Payer: Self-pay | Admitting: Oncology

## 2014-07-16 VITALS — BP 134/47 | HR 64 | Temp 98.4°F | Resp 18 | Ht 70.0 in | Wt 160.0 lb

## 2014-07-16 DIAGNOSIS — K5900 Constipation, unspecified: Secondary | ICD-10-CM

## 2014-07-16 DIAGNOSIS — C61 Malignant neoplasm of prostate: Secondary | ICD-10-CM

## 2014-07-16 LAB — COMPREHENSIVE METABOLIC PANEL (CC13)
ALK PHOS: 91 U/L (ref 40–150)
ALT: 32 U/L (ref 0–55)
AST: 47 U/L — AB (ref 5–34)
Albumin: 3.8 g/dL (ref 3.5–5.0)
Anion Gap: 9 mEq/L (ref 3–11)
BUN: 23 mg/dL (ref 7.0–26.0)
CO2: 24 mEq/L (ref 22–29)
Calcium: 9.7 mg/dL (ref 8.4–10.4)
Chloride: 109 mEq/L (ref 98–109)
Creatinine: 1.1 mg/dL (ref 0.7–1.3)
EGFR: 56 mL/min/{1.73_m2} — AB (ref >90)
GLUCOSE: 117 mg/dL (ref 70–140)
POTASSIUM: 3.8 meq/L (ref 3.5–5.1)
SODIUM: 141 meq/L (ref 136–145)
TOTAL PROTEIN: 7.2 g/dL (ref 6.4–8.3)
Total Bilirubin: 1.06 mg/dL (ref 0.20–1.20)

## 2014-07-16 LAB — CBC WITH DIFFERENTIAL/PLATELET
BASO%: 0.2 % (ref 0.0–2.0)
Basophils Absolute: 0 10*3/uL (ref 0.0–0.1)
EOS%: 5.9 % (ref 0.0–7.0)
Eosinophils Absolute: 0.4 10*3/uL (ref 0.0–0.5)
HCT: 34.1 % — ABNORMAL LOW (ref 38.4–49.9)
HGB: 12 g/dL — ABNORMAL LOW (ref 13.0–17.1)
LYMPH%: 21.7 % (ref 14.0–49.0)
MCH: 33.1 pg (ref 27.2–33.4)
MCHC: 35.2 g/dL (ref 32.0–36.0)
MCV: 94.2 fL (ref 79.3–98.0)
MONO#: 0.4 10*3/uL (ref 0.1–0.9)
MONO%: 5.8 % (ref 0.0–14.0)
NEUT#: 4 10*3/uL (ref 1.5–6.5)
NEUT%: 66.4 % (ref 39.0–75.0)
Platelets: 166 10*3/uL (ref 140–400)
RBC: 3.62 10*6/uL — AB (ref 4.20–5.82)
RDW: 12.8 % (ref 11.0–14.6)
WBC: 6.1 10*3/uL (ref 4.0–10.3)
lymph#: 1.3 10*3/uL (ref 0.9–3.3)

## 2014-07-16 NOTE — Telephone Encounter (Signed)
per pof to sch pt appt-gave pt copy of sch °

## 2014-07-16 NOTE — Progress Notes (Signed)
ID: [name]   DOB: May 21, 1921  MR#: [id number]  UDJ#:[id number]  PCP: Renato Shin, MD GYN: SU:  OTHER MD: Germain Osgood [phone]); Franchot Gallo; Lavonna Monarch; Daryel November; Erskine Emery; Arloa Koh, Ileene Hutchinson [facility]  CHIEF COMPLAINT: Stage IV prostate cancer  CURRENT TREATMENT: Observation  HISTORY OF PROSTATE CANCER: From the original intake note:  Mr. Romulus tells me his PSA and prostate had been followed for a very long time.  His PSA was approximately 15 about a year ago [2007], then about 6 months ago it went to 51, which means that it had just about doubled in 6 months.  Of course, that raised red flags and the patient had his prostate biopsied at [facility] under Dr. Gareth Eagle 08/02/2006.  This showed a Gleason 9 (4+5) adenocarcinoma in 5 of the 6 cores, the other core showing a Gleason 8 (3+5).  In short, an aggressive tumor.  The patient had a bone scan 08/10/2006, which was negative, and CTs of the abdomen and pelvis 08/15/2006, which showed an enlarged prostate at 5.7 x 5.1 cm, but no evidence of adenopathy or extracapsular spread; no obvious invasion of the rectum or the bladder; and negative bone windows.  There was incidental nodularity around the left perirenal fat of unclear significance.   The patient was started on Casodex for about a month before receiving his first Lupron shot 09/05/2006.  He had a second Lupron shot 09/09/2006.  His PSA responded well, and his subsequent history is as detailed below.  INTERVAL HISTORY: Justen returns today with his wife Rod Holler for followup of his prostate cancer. Interval history is significant chiefly for Rod Holler having developed problems with diverticular disease which means Ezana is now the primary caregiver. He himself remains active, although he only goes swimming approximately once a week now. He denies any pain, hot flashes or other symptoms possibly relating to his prostate cancer history. They're planning a trip to [place] next  week returning the first week in January. They are expecting another great-grandchild  REVIEW OF SYSTEMS: Asked what his worse problem is coming says that his constipation. His bowel movements tend to be hard. He has not seen any blood. He is finding it a bit harder to get around but denies any falls. A detailed review of systems today was otherwise entirely stable   Past Medical History  Diagnosis Date  . CAD (coronary artery disease)     PCI in the past / last catheterization May, 2008, medical therapy  . Hypertension   . Hyperlipemia   . Unspecified disorder of liver   . Cellulitis, leg     left  . Pulmonary nodule, right     2008 / unchanged CT scan, July, 2009  . Abdominal pain, left lower quadrant   . Adenocarcinoma of prostate   . Esophageal stricture   . Spinal stenosis   . Hyperglycemia   . Hyperthyroidism   . GERD (gastroesophageal reflux disease)   . Hx of colonic polyps   . Renal artery stenosis     Mild  . Atheroma     no penetrating ulcer, Abdominal aorta  . Inferior mesenteric artery injury     High-grade stenosis, CT scan, 2006  . Vertigo   . Diverticulum   . Incisional hernia   . Rash     Ticlid  . Cough     ACE Inhibitor, tolerates ARB  . Carotid artery disease     Doppler, 2006, minimal disease  //  Doppler, May,  11-13-10, mild increase velocities, 40-59% bilateral stenoses  . Renal cyst     Hyperdense cyst lower pole left kidney  . Ejection fraction     EF 65%, catheterization, Nov 13, 2006  . Dizziness   . Unsteady gait   . Sinus bradycardia    PAST SURGICAL HISTORY: Past Surgical History  Procedure Laterality Date  . Esophagogastroduodenoscopy    . Cardiac catheterization    . Incisional hernia repair      FAMILY HISTORY Family History  Problem Relation Age of Onset  . Breast cancer Sister     2 sisters  . Pemphigus vulgaris Mother     died at 57  The patient's father died at the age of 74 from an embolus.  The patient's mother died at the age  of 63 in the setting of dementia.  The patient had 1 sister die with breast cancer (neglected), 1 sister died with diabetes, and 1 brother with Parkinson's disease.  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  He has been married 60+ years to Rod Holler, who used to be a Firefighter.  They have 4 children; 2 of them are Urologists in [place] (married to twin sisters).  In particular, they would like me to contact Aaron Edelman [phone] is the fax #) with any change in plans.  One of the sons, the oldest one, was a [place] in [place] (died in 11/12/12 and from non-Hodgkin's lymphoma) and then the daughter, Clarise Cruz, is our Dr. Rosanna Randy wife. The patient has a total of 12 grandchildren and is waiting on great-grandchildren.   ADVANCED DIRECTIVES: in place  HEALTH MAINTENANCE: History  Substance Use Topics  . Smoking status: Never Smoker   . Smokeless tobacco: Not on file  . Alcohol Use: No     Colonoscopy: 11-13-2003  Bone density:  Lipid panel:  Allergies  Allergen Reactions  . Isosorbide Other (See Comments)    "sever Headache" per patient  . Lisinopril     REACTION: COUGH  . Ticlopidine Hcl     REACTION: rash    Current Outpatient Prescriptions  Medication Sig Dispense Refill  . aspirin 81 MG tablet Take 81 mg by mouth daily.      Marland Kitchen ipratropium (ATROVENT) 0.06 % nasal spray Place 2 sprays into the nose as needed.     Marland Kitchen leuprolide (LUPRON) 3.75 MG injection Given by Dr Darrick Penna -- not sure of dosage    . methimazole (TAPAZOLE) 5 MG tablet TAKE 1 TABLET BY MOUTH 3 TIMES A WEEK 40 tablet 1  . metoprolol tartrate (LOPRESSOR) 25 MG tablet TAKE 1 TABLET (25 MG TOTAL) BY MOUTH 2 (TWO) TIMES DAILY. 180 tablet 0  . MICRONIZED COLESTIPOL HCL 1 G tablet TAKE 5 TABLETS BY MOUTH EVERY DAY AS DIRECTED 450 tablet 3  . Misc. Devices (QUAD CANE) MISC 1 Device by Does not apply route once. 1 each 0  . nitroGLYCERIN (NITROSTAT) 0.4 MG SL tablet Place 1 tablet (0.4 mg total) under the tongue  every 5 (five) minutes as needed. 25 tablet 3  . olmesartan (BENICAR) 5 MG tablet Take 2 tablets (10 mg total) by mouth daily. 180 tablet 3  . pantoprazole (PROTONIX) 40 MG tablet TAKE 1 TABLET (40 MG TOTAL) BY MOUTH DAILY. 90 tablet 3  . triamcinolone cream (KENALOG) 0.1 %   3   No current facility-administered medications for this visit.    OBJECTIVE: Elderly white man who appears stated age 80 Vitals:   07/16/14 1104  BP: 134/47  Pulse: 64  Temp: 98.4 F (36.9 C)  Resp: 18     Body mass index is 22.96 kg/(m^2).    ECOG FS: 2  Sclerae unicteric, EOMs intact Oropharynx clear, teeth in good repair No cervical or supraclavicular adenopathy Lungs no rales or rhonchi Heart regular rate and rhythm Abd soft, nontender, positive bowel sounds MSK no focal spinal tenderness Neuro: nonfocal, well oriented, positive affect  LAB RESULTS: PSA and testosterone levels today pending  Lab Results  Component Value Date   WBC 6.1 07/16/2014   NEUTROABS 4.0 07/16/2014   HGB 12.0* 07/16/2014   HCT 34.1* 07/16/2014   MCV 94.2 07/16/2014   PLT 166 07/16/2014      Chemistry      Component Value Date/Time   NA 141 06/04/2014 1051   NA 140 09/26/2012 1141   K 3.9 06/04/2014 1051   K 4.1 09/26/2012 1141   CL 107 01/02/2013 1059   CL 105 09/26/2012 1141   CO2 24 06/04/2014 1051   CO2 23 09/26/2012 1141   BUN 16.8 06/04/2014 1051   BUN 20 09/26/2012 1141   CREATININE 1.0 06/04/2014 1051   CREATININE 0.98 09/26/2012 1141      Component Value Date/Time   CALCIUM 9.7 06/04/2014 1051   CALCIUM 9.9 09/26/2012 1141   CALCIUM 10.0 01/31/2007 2246   ALKPHOS 93 06/04/2014 1051   ALKPHOS 109 09/26/2012 1141   AST 40* 06/04/2014 1051   AST 50* 09/26/2012 1141   ALT 31 06/04/2014 1051   ALT 38 09/26/2012 1141   BILITOT 0.70 06/04/2014 1051   BILITOT 0.6 09/26/2012 1141     Results for TORRES, HARDENBROOK (MRN [id number]) as of 07/17/2014 19:40  Ref. Range 01/15/2014 10:51 03/12/2014  10:31 04/24/2014 10:10 06/04/2014 10:51 07/16/2014 10:47  Testosterone Latest Range: 300-890 ng/dL 20 (L) 107 (L) 51 (L) 25 (L) 32 (L)   Results for ZACKRY, DEINES (MRN [id number]) as of 07/17/2014 19:40  Ref. Range 01/15/2014 10:51 03/12/2014 10:31 04/24/2014 10:10 06/04/2014 10:51 07/16/2014 10:47  PSA Latest Range: <=4.00 ng/mL 0.12 0.34 0.19 0.23 0.24   No results found for: LABCA2  No components found for: LABCA125  No results for input(s): INR in the last 168 hours.  Urinalysis    Component Value Date/Time   COLORURINE YELLOW 04/20/2010 1540   APPEARANCEUR CLEAR 04/20/2010 1540   LABSPEC >=1.030 04/20/2010 1540   PHURINE 5.5 04/20/2010 1540   GLUCOSEU NEGATIVE 04/20/2010 1540   BILIRUBINUR NEGATIVE 04/20/2010 1540   KETONESUR TRACE 04/20/2010 1540   UROBILINOGEN 0.2 04/20/2010 1540   NITRITE NEGATIVE 04/20/2010 1540   LEUKOCYTESUR NEGATIVE 04/20/2010 1540    STUDIES: No results found.   ASSESSMENT: 80 y.o.  [place] man with a history of prostate cancer initially biopsied January 2008, Gleason 4+5, with a rapid doubling time, treated initially with Casodex and Lupron with an excellent PSA response, status post radiation to 78 Gy given with curative intent completed October 2008.  Off Lupron as of December 2008.  (1) With the eventual development of right hip pain and a rapid PSA doubling time, he resumed Lupron and Casodex September 2011 with the Casodex stopped after a month.  He receives Lupron intermittently, depending on his PSA and testosterone levels, most recent dose 01/09/2013  PLAN: Sheddrick is doing fine as far as his prostate cancer is concerned. We went over the numbers, which are listed above. With the results we have today I don't see any need for intervention.  What is bothering him  the most is Ruth's decline: her knees, the diverticular disease, blindness in one eye, and other issues. Despite all this they are going to [place] for a week which I think is great  thing for them to enjoy  He is going to come for lab work alone on February, May, August, and November 2016 and he will see me one week after the November lab work. If there is a significant rise in his testosterone or PSA we will continue Lupron as before. He only needed 2 doses though in 2015 (January and August).  Ronnie knows to call for any problems that may develop before his next visit here.  Keonda Dow C    07/16/2014

## 2014-07-17 LAB — PSA: PSA: 0.24 ng/mL (ref <=4.00)

## 2014-07-17 LAB — TESTOSTERONE: Testosterone: 32 ng/dL — ABNORMAL LOW (ref 300–890)

## 2014-07-20 ENCOUNTER — Other Ambulatory Visit: Payer: Self-pay | Admitting: Cardiology

## 2014-07-22 ENCOUNTER — Telehealth: Payer: Self-pay | Admitting: Endocrinology

## 2014-07-22 ENCOUNTER — Other Ambulatory Visit: Payer: Self-pay | Admitting: *Deleted

## 2014-07-22 MED ORDER — ZOLPIDEM TARTRATE 5 MG PO TABS: 5.0000 mg | ORAL_TABLET | Freq: Every evening | ORAL | Status: DC | PRN

## 2014-07-22 NOTE — Telephone Encounter (Signed)
Please read note below and advise.  

## 2014-07-22 NOTE — Telephone Encounter (Signed)
Done

## 2014-07-22 NOTE — Telephone Encounter (Signed)
ok 

## 2014-07-22 NOTE — Telephone Encounter (Signed)
Rx refill sent to patient pharmacy   

## 2014-07-22 NOTE — Telephone Encounter (Signed)
Patient need a refill of Ambien, send to [facility] on [address] Loanne Drilling patient

## 2014-08-01 ENCOUNTER — Other Ambulatory Visit: Payer: Self-pay | Admitting: Endocrinology

## 2014-08-28 DIAGNOSIS — H1013 Acute atopic conjunctivitis, bilateral: Secondary | ICD-10-CM | POA: Diagnosis not present

## 2014-09-03 ENCOUNTER — Other Ambulatory Visit (HOSPITAL_BASED_OUTPATIENT_CLINIC_OR_DEPARTMENT_OTHER): Payer: Medicare Other

## 2014-09-03 DIAGNOSIS — C61 Malignant neoplasm of prostate: Secondary | ICD-10-CM

## 2014-09-03 LAB — COMPREHENSIVE METABOLIC PANEL (CC13)
ALBUMIN: 3.8 g/dL (ref 3.5–5.0)
ALT: 28 U/L (ref 0–55)
AST: 41 U/L — AB (ref 5–34)
Alkaline Phosphatase: 106 U/L (ref 40–150)
Anion Gap: 8 mEq/L (ref 3–11)
BUN: 19.9 mg/dL (ref 7.0–26.0)
CALCIUM: 10.1 mg/dL (ref 8.4–10.4)
CHLORIDE: 108 meq/L (ref 98–109)
CO2: 23 mEq/L (ref 22–29)
Creatinine: 1.1 mg/dL (ref 0.7–1.3)
EGFR: 57 mL/min/{1.73_m2} — ABNORMAL LOW (ref >90)
Glucose: 106 mg/dl (ref 70–140)
POTASSIUM: 4.2 meq/L (ref 3.5–5.1)
Sodium: 140 mEq/L (ref 136–145)
Total Bilirubin: 0.71 mg/dL (ref 0.20–1.20)
Total Protein: 7.2 g/dL (ref 6.4–8.3)

## 2014-09-03 LAB — CBC WITH DIFFERENTIAL/PLATELET
BASO%: 0.3 % (ref 0.0–2.0)
Basophils Absolute: 0 10*3/uL (ref 0.0–0.1)
EOS%: 5.4 % (ref 0.0–7.0)
Eosinophils Absolute: 0.4 10*3/uL (ref 0.0–0.5)
HCT: 35.8 % — ABNORMAL LOW (ref 38.4–49.9)
HGB: 12.5 g/dL — ABNORMAL LOW (ref 13.0–17.1)
LYMPH%: 22.7 % (ref 14.0–49.0)
MCH: 32.7 pg (ref 27.2–33.4)
MCHC: 34.9 g/dL (ref 32.0–36.0)
MCV: 93.7 fL (ref 79.3–98.0)
MONO#: 0.4 10*3/uL (ref 0.1–0.9)
MONO%: 5.8 % (ref 0.0–14.0)
NEUT%: 65.8 % (ref 39.0–75.0)
NEUTROS ABS: 4.2 10*3/uL (ref 1.5–6.5)
PLATELETS: 187 10*3/uL (ref 140–400)
RBC: 3.82 10*6/uL — ABNORMAL LOW (ref 4.20–5.82)
RDW: 12.6 % (ref 11.0–14.6)
WBC: 6.4 10*3/uL (ref 4.0–10.3)
lymph#: 1.5 10*3/uL (ref 0.9–3.3)

## 2014-09-04 LAB — TESTOSTERONE: Testosterone: 31 ng/dL — ABNORMAL LOW (ref 300–890)

## 2014-09-04 LAB — PSA: PSA: 0.37 ng/mL (ref <=4.00)

## 2014-10-11 ENCOUNTER — Ambulatory Visit: Payer: Medicare Other | Admitting: Cardiology

## 2014-10-16 ENCOUNTER — Encounter: Payer: Self-pay | Admitting: Cardiology

## 2014-10-16 ENCOUNTER — Ambulatory Visit (INDEPENDENT_AMBULATORY_CARE_PROVIDER_SITE_OTHER): Payer: Medicare Other | Admitting: Cardiology

## 2014-10-16 VITALS — BP 150/72 | HR 72 | Ht 70.5 in | Wt 166.0 lb

## 2014-10-16 DIAGNOSIS — R079 Chest pain, unspecified: Secondary | ICD-10-CM | POA: Diagnosis not present

## 2014-10-16 DIAGNOSIS — I25118 Atherosclerotic heart disease of native coronary artery with other forms of angina pectoris: Secondary | ICD-10-CM | POA: Diagnosis not present

## 2014-10-16 DIAGNOSIS — I10 Essential (primary) hypertension: Secondary | ICD-10-CM

## 2014-10-16 DIAGNOSIS — R011 Cardiac murmur, unspecified: Secondary | ICD-10-CM | POA: Diagnosis not present

## 2014-10-16 MED ORDER — AMLODIPINE BESYLATE 2.5 MG PO TABS: 2.5000 mg | ORAL_TABLET | Freq: Every day | ORAL | Status: DC

## 2014-10-16 NOTE — Assessment & Plan Note (Signed)
Blood pressures controlled. No change in therapy. 

## 2014-10-16 NOTE — Patient Instructions (Signed)
Your physician has recommended you make the following change in your medication: start taking Amlodipine 2.5 mg daily   Your physician has requested that you have an echocardiogram. Echocardiography is a painless test that uses sound waves to create images of your heart. It provides your doctor with information about the size and shape of your heart and how well your heart's chambers and valves are working. This procedure takes approximately one hour. There are no restrictions for this procedure.  Your physician recommends that you schedule a follow-up appointment in: Dr Ron Parker will determine.

## 2014-10-16 NOTE — Assessment & Plan Note (Signed)
His last catheterization was 2008. He is having increasing exertional angina. I have not been able to adjust his medications any further. Today we will try 2.5 mg of amlodipine. I'm concerned that he may have headaches from this. I feel that from the cardiac viewpoint it would be appropriate to proceed with cardiac catheterization. However I need to check with his family to be sure that we are all in agreement about how aggressive to be at this time. He is a very bright gentleman. I believe he is failing a bit. He does have a systolic murmur. There is aortic sclerosis in the past. Echo is to be done to assess LV function and to be sure that he has not developed significant aortic stenosis. The plan will be to add a small dose of amlodipine and to contact his family. He and his wife hoped to travel soon to the [place]. Unless he is feeling better, I'm not particularly comfortable with his traveling.

## 2014-10-16 NOTE — Progress Notes (Signed)
Cardiology Office Note   Date:  10/16/2014   ID:  Kurt Elliott, DOB November 17, 1920, MRN [id number]  PCP:  Renato Shin, MD  Cardiologist:  Dola Argyle, MD   Chief Complaint  Patient presents with  . Appointment    Follow-up coronary artery disease      History of Present Illness: Kurt Elliott is a 81 y.o. male who presents today to follow-up coronary disease. He has known disease and has undergone an intervention in the past. Recently he has had some exertional chest discomfort that I believe is ischemic. It occurred only with walking. He is not bothered with his swimming. I had tried a small dose of Imdur or any had significant headaches. He returns today to tell me that he did make a trip with the family. He was on a cruise ship and when trying to walk in the ship he had recurrent chest heaviness. In addition he had this chest heaviness with minimal lifting. Since he has been at home he's been stable. He has not returned to swimming which is his long-standing activity.    Past Medical History  Diagnosis Date  . CAD (coronary artery disease)     PCI in the past / last catheterization May, 2008, medical therapy  . Hypertension   . Hyperlipemia   . Unspecified disorder of liver   . Cellulitis, leg     left  . Pulmonary nodule, right     2008 / unchanged CT scan, July, 2009  . Abdominal pain, left lower quadrant   . Adenocarcinoma of prostate   . Esophageal stricture   . Spinal stenosis   . Hyperglycemia   . Hyperthyroidism   . GERD (gastroesophageal reflux disease)   . Hx of colonic polyps   . Renal artery stenosis     Mild  . Atheroma     no penetrating ulcer, Abdominal aorta  . Inferior mesenteric artery injury     High-grade stenosis, CT scan, 2006  . Vertigo   . Diverticulum   . Incisional hernia   . Rash     Ticlid  . Cough     ACE Inhibitor, tolerates ARB  . Carotid artery disease     Doppler, 2006, minimal disease  //  Doppler, May, 2012, mild  increase velocities, 40-59% bilateral stenoses  . Renal cyst     Hyperdense cyst lower pole left kidney  . Ejection fraction     EF 65%, catheterization, 2008  . Dizziness   . Unsteady gait   . Sinus bradycardia     Past Surgical History  Procedure Laterality Date  . Esophagogastroduodenoscopy    . Cardiac catheterization    . Incisional hernia repair      Patient Active Problem List   Diagnosis Date Noted  . Hip pain, bilateral 03/25/2014  . Dizziness   . Unsteady gait   . Sinus bradycardia   . Ejection fraction   . Carotid artery disease   . CAD (coronary artery disease)   . Pulmonary nodule, right   . Renal artery stenosis   . Rash   . Cough   . Renal cyst   . Inferior mesenteric artery injury   . ACUTE BRONCHITIS 06/29/2010  . VERTIGO 11/27/2009  . CELLULITIS, LEG, LEFT 12/30/2008  . UNSPECIFIED DISORDER OF LIVER 04/04/2008  . ABDOMINAL PAIN, LEFT LOWER QUADRANT 08/18/2007  . ADENOCARCINOMA, PROSTATE 06/15/2007  . HYPERTHYROIDISM 06/13/2007  . HYPERLIPIDEMIA 06/13/2007  . Essential hypertension 06/13/2007  . ESOPHAGEAL  STRICTURE 06/13/2007  . GERD 06/13/2007  . SPINAL STENOSIS 06/13/2007  . HYPERGLYCEMIA 06/13/2007  . COLONIC POLYPS, HX OF 06/13/2007      Current Outpatient Prescriptions  Medication Sig Dispense Refill  . aspirin 81 MG tablet Take 81 mg by mouth daily.      Marland Kitchen ipratropium (ATROVENT) 0.06 % nasal spray Place 2 sprays into the nose as needed.     Marland Kitchen leuprolide (LUPRON) 3.75 MG injection Given by Dr Darrick Penna -- not sure of dosage    . methimazole (TAPAZOLE) 5 MG tablet TAKE 1 TABLET BY MOUTH 3 TIMES A WEEK 40 tablet 1  . metoprolol tartrate (LOPRESSOR) 25 MG tablet TAKE 1 TABLET (25 MG TOTAL) BY MOUTH 2 (TWO) TIMES DAILY. 180 tablet 3  . MICRONIZED COLESTIPOL HCL 1 G tablet TAKE 5 TABLETS BY MOUTH EVERY DAY AS DIRECTED 450 tablet 3  . nitroGLYCERIN (NITROSTAT) 0.4 MG SL tablet Place 1 tablet (0.4 mg total) under the tongue every 5 (five)  minutes as needed. 25 tablet 3  . olmesartan (BENICAR) 5 MG tablet Take 2 tablets (10 mg total) by mouth daily. 180 tablet 3  . pantoprazole (PROTONIX) 40 MG tablet TAKE 1 TABLET (40 MG TOTAL) BY MOUTH DAILY. 90 tablet 3  . triamcinolone cream (KENALOG) 0.1 %   3  . zolpidem (AMBIEN) 5 MG tablet Take 1 tablet (5 mg total) by mouth at bedtime as needed. for sleep 30 tablet 1  . amLODipine (NORVASC) 2.5 MG tablet Take 1 tablet (2.5 mg total) by mouth daily. 60 tablet 3  . Misc. Devices (QUAD CANE) MISC 1 Device by Does not apply route once. (Patient not taking: Reported on 10/16/2014) 1 each 0   No current facility-administered medications for this visit.    Allergies:   Isosorbide; Lisinopril; and Ticlopidine hcl    Social History:  The patient  reports that he has never smoked. He does not have any smokeless tobacco history on file. He reports that he does not drink alcohol or use illicit drugs.   Family History:  The patient's family history includes Breast cancer in his sister; Pemphigus vulgaris in his mother.    ROS:  Please see the history of present illness.     Patient denies fever, chills, headache, sweats, rash, change in vision, change in hearing, cough, nausea or vomiting, urinary symptoms. All other systems are reviewed and are negative.   PHYSICAL EXAM: VS:  BP 150/72 mmHg  Pulse 72  Ht 5' 10.5" (1.791 m)  Wt 166 lb (75.297 kg)  BMI 23.47 kg/m2 , The patient is stable. He looks a little fatigued today. He is here with his wife. He has kyphosis of the thoracic spine. Head is atraumatic. Sclera and conjunctiva are normal. There is no jugular venous distention. Lungs are clear. Respiratory effort is not labored. Cardiac exam reveals S1 and S2. The abdomen is soft. There is no peripheral edema. There are no musculoskeletal deformities. There are no skin rashes.  EKG:   EKG is done today and reviewed by me. His EKG continues to be normal over the years. There is no change from  the past.   Recent Labs: 12/28/2013: TSH 2.55 09/03/2014: ALT 28; BUN 19.9; Creatinine 1.1; Hemoglobin 12.5*; Platelets 187; Potassium 4.2; Sodium 140    Lipid Panel    Component Value Date/Time   CHOL 161 12/28/2013 1204   TRIG 258.0* 12/28/2013 1204   TRIG 137 05/23/2006 0817   HDL 37.50* 12/28/2013 1204   CHOLHDL  4 12/28/2013 1204   CHOLHDL 3.6 CALC 05/23/2006 0817   VLDL 51.6* 12/28/2013 1204   LDLCALC 72 12/28/2013 1204   LDLDIRECT 97.5 06/01/2011 1419      Wt Readings from Last 3 Encounters:  10/16/14 166 lb (75.297 kg)  07/16/14 160 lb (72.576 kg)  07/12/14 162 lb (73.483 kg)      Current medicines are reviewed  He understands his medications.     ASSESSMENT AND PLAN:

## 2014-10-17 ENCOUNTER — Ambulatory Visit (HOSPITAL_COMMUNITY): Payer: Medicare Other | Attending: Cardiology | Admitting: Radiology

## 2014-10-17 DIAGNOSIS — E785 Hyperlipidemia, unspecified: Secondary | ICD-10-CM | POA: Diagnosis not present

## 2014-10-17 DIAGNOSIS — R011 Cardiac murmur, unspecified: Secondary | ICD-10-CM

## 2014-10-17 DIAGNOSIS — R079 Chest pain, unspecified: Secondary | ICD-10-CM

## 2014-10-17 DIAGNOSIS — I10 Essential (primary) hypertension: Secondary | ICD-10-CM | POA: Diagnosis not present

## 2014-10-17 DIAGNOSIS — I25118 Atherosclerotic heart disease of native coronary artery with other forms of angina pectoris: Secondary | ICD-10-CM

## 2014-10-17 NOTE — Progress Notes (Signed)
Echocardiogram performed.  

## 2014-10-18 ENCOUNTER — Other Ambulatory Visit (INDEPENDENT_AMBULATORY_CARE_PROVIDER_SITE_OTHER): Payer: Medicare Other | Admitting: *Deleted

## 2014-10-18 ENCOUNTER — Encounter: Payer: Self-pay | Admitting: Cardiology

## 2014-10-18 ENCOUNTER — Telehealth: Payer: Self-pay | Admitting: Cardiology

## 2014-10-18 ENCOUNTER — Other Ambulatory Visit: Payer: Self-pay

## 2014-10-18 ENCOUNTER — Other Ambulatory Visit: Payer: Self-pay | Admitting: Cardiology

## 2014-10-18 DIAGNOSIS — R079 Chest pain, unspecified: Secondary | ICD-10-CM | POA: Diagnosis not present

## 2014-10-18 DIAGNOSIS — Z01818 Encounter for other preprocedural examination: Secondary | ICD-10-CM

## 2014-10-18 DIAGNOSIS — I2510 Atherosclerotic heart disease of native coronary artery without angina pectoris: Secondary | ICD-10-CM

## 2014-10-18 LAB — CBC WITH DIFFERENTIAL/PLATELET
BASOS ABS: 0 10*3/uL (ref 0.0–0.1)
BASOS PCT: 0 % (ref 0–1)
EOS PCT: 6 % — AB (ref 0–5)
Eosinophils Absolute: 0.4 10*3/uL (ref 0.0–0.7)
HCT: 32.9 % — ABNORMAL LOW (ref 39.0–52.0)
HEMOGLOBIN: 11.4 g/dL — AB (ref 13.0–17.0)
Lymphocytes Relative: 23 % (ref 12–46)
Lymphs Abs: 1.4 10*3/uL (ref 0.7–4.0)
MCH: 32.2 pg (ref 26.0–34.0)
MCHC: 34.7 g/dL (ref 30.0–36.0)
MCV: 92.9 fL (ref 78.0–100.0)
MONO ABS: 0.4 10*3/uL (ref 0.1–1.0)
Monocytes Relative: 6 % (ref 3–12)
Neutro Abs: 4 10*3/uL (ref 1.7–7.7)
Neutrophils Relative %: 65 % (ref 43–77)
PLATELETS: 193 10*3/uL (ref 150–400)
RBC: 3.54 MIL/uL — ABNORMAL LOW (ref 4.22–5.81)
RDW: 13.1 % (ref 11.5–15.5)
WBC: 6.1 10*3/uL (ref 4.0–10.5)

## 2014-10-18 LAB — BASIC METABOLIC PANEL
BUN: 19 mg/dL (ref 6–23)
CALCIUM: 9.9 mg/dL (ref 8.4–10.5)
CO2: 26 meq/L (ref 19–32)
CREATININE: 1.04 mg/dL (ref 0.50–1.35)
Chloride: 103 mEq/L (ref 96–112)
GLUCOSE: 105 mg/dL — AB (ref 70–99)
Potassium: 4.3 mEq/L (ref 3.5–5.3)
Sodium: 137 mEq/L (ref 135–145)

## 2014-10-18 LAB — PROTIME-INR
INR: 1.16 (ref <1.50)
Prothrombin Time: 14.9 seconds (ref 11.6–15.2)

## 2014-10-18 NOTE — Telephone Encounter (Signed)
We have received lab results and Dr Ron Parker is aware.

## 2014-10-18 NOTE — Progress Notes (Signed)
I saw the patient in the office on October 16, 2014. Since that time he has had a two-dimensional echo. Left ventricular function remains very good. He does have mild aortic stenosis. I feel that this is not the basis of his current symptoms.  I've spoken with the patient's son-in-law, Dr. Alben Spittle of our GI team. He knows the patient well. He was in touch with other members of the family. Everyone agrees that despite his age of 37, the patient is very viable. It is appropriate to fully assess and treat his increasing exertional chest heaviness. He and the family are in agreement with proceeding with cardiac catheterization if this is my recommendation.  I called and spoke with the patient again this morning. I told him that we will plan to go ahead with his catheterization Monday morning and he is in full agreement. The cath will be done early in the morning and he is aware. I will also speak with Dr. Burt Knack about this patient.  Daryel November, MD

## 2014-10-18 NOTE — H&P (Signed)
Cardiology Office Note   Date: 10/16/2014   ID: MACIO KISSOON, DOB [birth year], MRN [id number]  PCP: Renato Shin, MD Cardiologist: Dola Argyle, MD   Chief Complaint  Patient presents with  . Appointment    Follow-up coronary artery disease     History of Present Illness: Kurt Elliott is a 81 y.o. male who presents today to follow-up coronary disease. He has known disease and has undergone an intervention in the past. Recently he has had some exertional chest discomfort that I believe is ischemic. It occurred only with walking. He is not bothered with his swimming. I had tried a small dose of Imdur or any had significant headaches. He returns today to tell me that he did make a trip with the family. He was on a cruise ship and when trying to walk in the ship he had recurrent chest heaviness. In addition he had this chest heaviness with minimal lifting. Since he has been at home he's been stable. He has not returned to swimming which is his long-standing activity.    Past Medical History  Diagnosis Date  . CAD (coronary artery disease)     PCI in the past / last catheterization May, 2008, medical therapy  . Hypertension   . Hyperlipemia   . Unspecified disorder of liver   . Cellulitis, leg     left  . Pulmonary nodule, right     2008 / unchanged CT scan, July, 2009  . Abdominal pain, left lower quadrant   . Adenocarcinoma of prostate   . Esophageal stricture   . Spinal stenosis   . Hyperglycemia   . Hyperthyroidism   . GERD (gastroesophageal reflux disease)   . Hx of colonic polyps   . Renal artery stenosis     Mild  . Atheroma     no penetrating ulcer, Abdominal aorta  . Inferior mesenteric artery injury     High-grade stenosis, CT scan, 2006  . Vertigo   . Diverticulum   . Incisional hernia   . Rash     Ticlid  . Cough     ACE Inhibitor, tolerates  ARB  . Carotid artery disease     Doppler, 2006, minimal disease // Doppler, May, 2012, mild increase velocities, 40-59% bilateral stenoses  . Renal cyst     Hyperdense cyst lower pole left kidney  . Ejection fraction     EF 65%, catheterization, 2008  . Dizziness   . Unsteady gait   . Sinus bradycardia     Past Surgical History  Procedure Laterality Date  . Esophagogastroduodenoscopy    . Cardiac catheterization    . Incisional hernia repair      Patient Active Problem List   Diagnosis Date Noted  . Hip pain, bilateral 03/25/2014  . Dizziness   . Unsteady gait   . Sinus bradycardia   . Ejection fraction   . Carotid artery disease   . CAD (coronary artery disease)   . Pulmonary nodule, right   . Renal artery stenosis   . Rash   . Cough   . Renal cyst   . Inferior mesenteric artery injury   . ACUTE BRONCHITIS 06/29/2010  . VERTIGO 11/27/2009  . CELLULITIS, LEG, LEFT 12/30/2008  . UNSPECIFIED DISORDER OF LIVER 04/04/2008  . ABDOMINAL PAIN, LEFT LOWER QUADRANT 08/18/2007  . ADENOCARCINOMA, PROSTATE 06/15/2007  . HYPERTHYROIDISM 06/13/2007  . HYPERLIPIDEMIA 06/13/2007  . Essential hypertension 06/13/2007  . ESOPHAGEAL STRICTURE 06/13/2007  . GERD 06/13/2007  . SPINAL  STENOSIS 06/13/2007  . HYPERGLYCEMIA 06/13/2007  . COLONIC POLYPS, HX OF 06/13/2007      Current Outpatient Prescriptions  Medication Sig Dispense Refill  . aspirin 81 MG tablet Take 81 mg by mouth daily.     Marland Kitchen ipratropium (ATROVENT) 0.06 % nasal spray Place 2 sprays into the nose as needed.     Marland Kitchen leuprolide (LUPRON) 3.75 MG injection Given by Dr Darrick Penna -- not sure of dosage    . methimazole (TAPAZOLE) 5 MG tablet TAKE 1 TABLET BY MOUTH 3 TIMES A WEEK 40 tablet 1  . metoprolol tartrate (LOPRESSOR) 25 MG tablet TAKE 1 TABLET (25 MG  TOTAL) BY MOUTH 2 (TWO) TIMES DAILY. 180 tablet 3  . MICRONIZED COLESTIPOL HCL 1 G tablet TAKE 5 TABLETS BY MOUTH EVERY DAY AS DIRECTED 450 tablet 3  . nitroGLYCERIN (NITROSTAT) 0.4 MG SL tablet Place 1 tablet (0.4 mg total) under the tongue every 5 (five) minutes as needed. 25 tablet 3  . olmesartan (BENICAR) 5 MG tablet Take 2 tablets (10 mg total) by mouth daily. 180 tablet 3  . pantoprazole (PROTONIX) 40 MG tablet TAKE 1 TABLET (40 MG TOTAL) BY MOUTH DAILY. 90 tablet 3  . triamcinolone cream (KENALOG) 0.1 %   3  . zolpidem (AMBIEN) 5 MG tablet Take 1 tablet (5 mg total) by mouth at bedtime as needed. for sleep 30 tablet 1  . amLODipine (NORVASC) 2.5 MG tablet Take 1 tablet (2.5 mg total) by mouth daily. 60 tablet 3  . Misc. Devices (QUAD CANE) MISC 1 Device by Does not apply route once. (Patient not taking: Reported on 10/16/2014) 1 each 0   No current facility-administered medications for this visit.    Allergies: Isosorbide; Lisinopril; and Ticlopidine hcl    Social History: The patient  reports that he has never smoked. He does not have any smokeless tobacco history on file. He reports that he does not drink alcohol or use illicit drugs.   Family History: The patient's family history includes Breast cancer in his sister; Pemphigus vulgaris in his mother.    ROS: Please see the history of present illness. Patient denies fever, chills, headache, sweats, rash, change in vision, change in hearing, cough, nausea or vomiting, urinary symptoms. All other systems are reviewed and are negative.   PHYSICAL EXAM: VS: BP 150/72 mmHg  Pulse 72  Ht 5' 10.5" (1.791 m)  Wt 166 lb (75.297 kg)  BMI 23.47 kg/m2 , The patient is stable. He looks a little fatigued today. He is here with his wife. He has kyphosis of the thoracic spine. Head is atraumatic. Sclera and conjunctiva are normal. There is no jugular venous distention. Lungs are clear.  Respiratory effort is not labored. Cardiac exam reveals S1 and S2. The abdomen is soft. There is no peripheral edema. There are no musculoskeletal deformities. There are no skin rashes.  EKG: EKG is done today and reviewed by me. His EKG continues to be normal over the years. There is no change from the past.   Recent Labs: 12/28/2013: TSH 2.55 09/03/2014: ALT 28; BUN 19.9; Creatinine 1.1; Hemoglobin 12.5*; Platelets 187; Potassium 4.2; Sodium 140    Lipid Panel  Labs (Brief)       Component Value Date/Time   CHOL 161 12/28/2013 1204   TRIG 258.0* 12/28/2013 1204   TRIG 137 05/23/2006 0817   HDL 37.50* 12/28/2013 1204   CHOLHDL 4 12/28/2013 1204   CHOLHDL 3.6 CALC 05/23/2006 0817   VLDL 51.6* 12/28/2013 1204  LDLCALC 72 12/28/2013 1204   LDLDIRECT 97.5 06/01/2011 1419       Wt Readings from Last 3 Encounters:  10/16/14 166 lb (75.297 kg)  07/16/14 160 lb (72.576 kg)  07/12/14 162 lb (73.483 kg)      Current medicines are reviewed He understands his medications.     ASSESSMENT AND PLAN:              CAD (coronary artery disease) - Carlena Bjornstad, MD at 10/16/2014 5:29 PM     Status: Written Related Problem: CAD (coronary artery disease)   Expand All Collapse All   His last catheterization was 2008. He is having increasing exertional angina. I have not been able to adjust his medications any further. Today we will try 2.5 mg of amlodipine. I'm concerned that he may have headaches from this. I feel that from the cardiac viewpoint it would be appropriate to proceed with cardiac catheterization. However I need to check with his family to be sure that we are all in agreement about how aggressive to be at this time. He is a very bright gentleman. I believe he is failing a bit. He does have a systolic murmur. There is aortic sclerosis in the past. Echo is to be done to assess LV function and to be sure that he has  not developed significant aortic stenosis. The plan will be to add a small dose of amlodipine and to contact his family. He and his wife hoped to travel soon to the [place]. Unless he is feeling better, I'm not particularly comfortable with his traveling.            Essential hypertension - Carlena Bjornstad, MD at 10/16/2014 5:29 PM     Status: Written Related Problem: Essential hypertension   Expand All Collapse All   Blood pressures controlled. No change in therapy.         Since the patient's office visit, he is had a two-dimensional echo. Left ventricular function is normal. There is mild aortic stenosis. I spoken with the patient's son-in-law who had made decisions representing the family. Everybody is in agreement that the patient is very viable and that we should proceed with cardiac catheterization. I spoke with the patient and he was in agreement. I've made arrangements for him to be present on October 21, 2014 for cardiac catheterization. In addition, I spoke directly with Dr. Burt Knack about the plans.

## 2014-10-18 NOTE — Telephone Encounter (Signed)
New Message       Calling stating that they have stat lab results to read. They state they will fax them but were calling to read them to someone. Please call back if you have any questions.

## 2014-10-21 ENCOUNTER — Encounter: Payer: Self-pay | Admitting: Cardiology

## 2014-10-21 ENCOUNTER — Encounter (HOSPITAL_COMMUNITY): Payer: Self-pay | Admitting: Cardiovascular Disease

## 2014-10-21 ENCOUNTER — Encounter (HOSPITAL_COMMUNITY)
Admission: RE | Disposition: A | Discharge status: Home / Self Care | Payer: Medicare Other | Source: Ambulatory Visit | Attending: Cardiovascular Disease

## 2014-10-21 ENCOUNTER — Ambulatory Visit (HOSPITAL_COMMUNITY)
Admission: RE | Admit: 2014-10-21 | Discharge: 2014-10-22 | Disposition: A | Discharge status: Home / Self Care | Payer: Medicare Other | Source: Ambulatory Visit | Attending: Cardiovascular Disease | Admitting: Cardiovascular Disease

## 2014-10-21 DIAGNOSIS — E785 Hyperlipidemia, unspecified: Secondary | ICD-10-CM | POA: Insufficient documentation

## 2014-10-21 DIAGNOSIS — I2510 Atherosclerotic heart disease of native coronary artery without angina pectoris: Secondary | ICD-10-CM | POA: Diagnosis present

## 2014-10-21 DIAGNOSIS — I25118 Atherosclerotic heart disease of native coronary artery with other forms of angina pectoris: Secondary | ICD-10-CM | POA: Diagnosis not present

## 2014-10-21 DIAGNOSIS — Z888 Allergy status to other drugs, medicaments and biological substances status: Secondary | ICD-10-CM | POA: Insufficient documentation

## 2014-10-21 DIAGNOSIS — K219 Gastro-esophageal reflux disease without esophagitis: Secondary | ICD-10-CM | POA: Diagnosis present

## 2014-10-21 DIAGNOSIS — E0590 Thyrotoxicosis, unspecified without thyrotoxic crisis or storm: Secondary | ICD-10-CM | POA: Diagnosis not present

## 2014-10-21 DIAGNOSIS — I350 Nonrheumatic aortic (valve) stenosis: Secondary | ICD-10-CM | POA: Diagnosis not present

## 2014-10-21 DIAGNOSIS — Z86010 Personal history of colonic polyps: Secondary | ICD-10-CM | POA: Diagnosis not present

## 2014-10-21 DIAGNOSIS — Z7982 Long term (current) use of aspirin: Secondary | ICD-10-CM | POA: Diagnosis not present

## 2014-10-21 DIAGNOSIS — Z8546 Personal history of malignant neoplasm of prostate: Secondary | ICD-10-CM | POA: Diagnosis not present

## 2014-10-21 DIAGNOSIS — I10 Essential (primary) hypertension: Secondary | ICD-10-CM | POA: Diagnosis present

## 2014-10-21 DIAGNOSIS — I25119 Atherosclerotic heart disease of native coronary artery with unspecified angina pectoris: Secondary | ICD-10-CM | POA: Insufficient documentation

## 2014-10-21 DIAGNOSIS — Z955 Presence of coronary angioplasty implant and graft: Secondary | ICD-10-CM

## 2014-10-21 DIAGNOSIS — T82858A Stenosis of vascular prosthetic devices, implants and grafts, initial encounter: Secondary | ICD-10-CM | POA: Insufficient documentation

## 2014-10-21 DIAGNOSIS — I2584 Coronary atherosclerosis due to calcified coronary lesion: Secondary | ICD-10-CM | POA: Insufficient documentation

## 2014-10-21 HISTORY — PX: LEFT HEART CATHETERIZATION WITH CORONARY ANGIOGRAM: SHX5451

## 2014-10-21 HISTORY — PX: PERCUTANEOUS CORONARY STENT INTERVENTION (PCI-S): SHX5485

## 2014-10-21 LAB — POCT ACTIVATED CLOTTING TIME: Activated Clotting Time: 546 seconds

## 2014-10-21 SURGERY — LEFT HEART CATHETERIZATION WITH CORONARY ANGIOGRAM
Anesthesia: LOCAL

## 2014-10-21 MED ORDER — SODIUM CHLORIDE 0.9 % IJ SOLN: 3.0000 mL | INTRAMUSCULAR | Status: DC | PRN

## 2014-10-21 MED ORDER — METOPROLOL TARTRATE 25 MG PO TABS
25.0000 mg | ORAL_TABLET | Freq: Two times a day (BID) | ORAL | Status: DC
  Administered 2014-10-21 – 2014-10-22 (×2): 25 mg via ORAL
  Filled 2014-10-21 (×3): qty 1

## 2014-10-21 MED ORDER — SODIUM CHLORIDE 0.9 % IV SOLN: 1.0000 mL/kg/h | INTRAVENOUS | Status: AC

## 2014-10-21 MED ORDER — ONDANSETRON HCL 4 MG/2ML IJ SOLN: 4.0000 mg | Freq: Four times a day (QID) | INTRAMUSCULAR | Status: DC | PRN

## 2014-10-21 MED ORDER — OXYCODONE-ACETAMINOPHEN 5-325 MG PO TABS: 1.0000 | ORAL_TABLET | ORAL | Status: DC | PRN

## 2014-10-21 MED ORDER — IRBESARTAN 75 MG PO TABS
75.0000 mg | ORAL_TABLET | Freq: Every day | ORAL | Status: DC
  Administered 2014-10-22: 75 mg via ORAL
  Filled 2014-10-21: qty 1

## 2014-10-21 MED ORDER — SODIUM CHLORIDE 0.9 % IV SOLN: 250.0000 mL | INTRAVENOUS | Status: DC | PRN

## 2014-10-21 MED ORDER — PANTOPRAZOLE SODIUM 40 MG PO TBEC
40.0000 mg | DELAYED_RELEASE_TABLET | Freq: Every day | ORAL | Status: DC
  Administered 2014-10-22: 40 mg via ORAL
  Filled 2014-10-21: qty 1

## 2014-10-21 MED ORDER — HEPARIN SODIUM (PORCINE) 1000 UNIT/ML IJ SOLN
INTRAMUSCULAR | Status: AC
  Filled 2014-10-21: qty 1

## 2014-10-21 MED ORDER — ZOLPIDEM TARTRATE 5 MG PO TABS: 2.5000 mg | ORAL_TABLET | Freq: Every evening | ORAL | Status: DC | PRN

## 2014-10-21 MED ORDER — ASPIRIN 81 MG PO CHEW: 81.0000 mg | CHEWABLE_TABLET | ORAL | Status: DC

## 2014-10-21 MED ORDER — AMLODIPINE BESYLATE 2.5 MG PO TABS
2.5000 mg | ORAL_TABLET | Freq: Every day | ORAL | Status: DC
  Administered 2014-10-21: 2.5 mg via ORAL
  Filled 2014-10-21 (×2): qty 1

## 2014-10-21 MED ORDER — ACETAMINOPHEN 325 MG PO TABS: 650.0000 mg | ORAL_TABLET | ORAL | Status: DC | PRN

## 2014-10-21 MED ORDER — CLOPIDOGREL BISULFATE 75 MG PO TABS
75.0000 mg | ORAL_TABLET | Freq: Every day | ORAL | Status: DC
Start: 2014-10-22 — End: 2014-10-22
  Administered 2014-10-22: 75 mg via ORAL
  Filled 2014-10-21: qty 1

## 2014-10-21 MED ORDER — NITROGLYCERIN 1 MG/10 ML FOR IR/CATH LAB
INTRA_ARTERIAL | Status: AC
  Filled 2014-10-21: qty 10

## 2014-10-21 MED ORDER — ASPIRIN EC 81 MG PO TBEC
81.0000 mg | DELAYED_RELEASE_TABLET | Freq: Every day | ORAL | Status: DC
  Administered 2014-10-22: 81 mg via ORAL
  Filled 2014-10-21: qty 1

## 2014-10-21 MED ORDER — SODIUM CHLORIDE 0.9 % IJ SOLN
3.0000 mL | Freq: Two times a day (BID) | INTRAMUSCULAR | Status: DC
Start: 2014-10-21 — End: 2014-10-22
  Administered 2014-10-21: 3 mL via INTRAVENOUS

## 2014-10-21 MED ORDER — MIDAZOLAM HCL 2 MG/2ML IJ SOLN
INTRAMUSCULAR | Status: AC
  Filled 2014-10-21: qty 2

## 2014-10-21 MED ORDER — BIVALIRUDIN 250 MG IV SOLR
INTRAVENOUS | Status: AC
  Filled 2014-10-21: qty 250

## 2014-10-21 MED ORDER — SODIUM CHLORIDE 0.9 % IV SOLN
INTRAVENOUS | Status: DC
  Administered 2014-10-21: 07:00:00 via INTRAVENOUS

## 2014-10-21 MED ORDER — VERAPAMIL HCL 2.5 MG/ML IV SOLN
INTRAVENOUS | Status: AC
  Filled 2014-10-21: qty 2

## 2014-10-21 MED ORDER — FENTANYL CITRATE 0.05 MG/ML IJ SOLN
INTRAMUSCULAR | Status: AC
  Filled 2014-10-21: qty 2

## 2014-10-21 MED ORDER — HEPARIN (PORCINE) IN NACL 2-0.9 UNIT/ML-% IJ SOLN
INTRAMUSCULAR | Status: AC
  Filled 2014-10-21: qty 1500

## 2014-10-21 MED ORDER — LIDOCAINE HCL (PF) 1 % IJ SOLN
INTRAMUSCULAR | Status: AC
  Filled 2014-10-21: qty 30

## 2014-10-21 MED ORDER — SODIUM CHLORIDE 0.9 % IJ SOLN: 3.0000 mL | Freq: Two times a day (BID) | INTRAMUSCULAR | Status: DC

## 2014-10-21 MED ORDER — CLOPIDOGREL BISULFATE 300 MG PO TABS
ORAL_TABLET | ORAL | Status: AC
  Filled 2014-10-21: qty 2

## 2014-10-21 MED ORDER — ADENOSINE 12 MG/4ML IV SOLN
12.0000 mL | Freq: Once | INTRAVENOUS | Status: DC
  Filled 2014-10-21: qty 12

## 2014-10-21 NOTE — H&P (View-Only) (Signed)
Cardiology Office Note   Date: 10/16/2014   ID: JAICOB DIA, DOB 11-01-1920, MRN [id number]  PCP: Renato Shin, MD Cardiologist: Dola Argyle, MD   Chief Complaint  Patient presents with  . Appointment    Follow-up coronary artery disease     History of Present Illness: DALON REICHART is a 81 y.o. male who presents today to follow-up coronary disease. He has known disease and has undergone an intervention in the past. Recently he has had some exertional chest discomfort that I believe is ischemic. It occurred only with walking. He is not bothered with his swimming. I had tried a small dose of Imdur or any had significant headaches. He returns today to tell me that he did make a trip with the family. He was on a cruise ship and when trying to walk in the ship he had recurrent chest heaviness. In addition he had this chest heaviness with minimal lifting. Since he has been at home he's been stable. He has not returned to swimming which is his long-standing activity.    Past Medical History  Diagnosis Date  . CAD (coronary artery disease)     PCI in the past / last catheterization May, 2008, medical therapy  . Hypertension   . Hyperlipemia   . Unspecified disorder of liver   . Cellulitis, leg     left  . Pulmonary nodule, right     2008 / unchanged CT scan, July, 2009  . Abdominal pain, left lower quadrant   . Adenocarcinoma of prostate   . Esophageal stricture   . Spinal stenosis   . Hyperglycemia   . Hyperthyroidism   . GERD (gastroesophageal reflux disease)   . Hx of colonic polyps   . Renal artery stenosis     Mild  . Atheroma     no penetrating ulcer, Abdominal aorta  . Inferior mesenteric artery injury     High-grade stenosis, CT scan, 2006  . Vertigo   . Diverticulum   . Incisional hernia   . Rash     Ticlid  . Cough     ACE Inhibitor, tolerates  ARB  . Carotid artery disease     Doppler, 2006, minimal disease // Doppler, May, 2012, mild increase velocities, 40-59% bilateral stenoses  . Renal cyst     Hyperdense cyst lower pole left kidney  . Ejection fraction     EF 65%, catheterization, 2008  . Dizziness   . Unsteady gait   . Sinus bradycardia     Past Surgical History  Procedure Laterality Date  . Esophagogastroduodenoscopy    . Cardiac catheterization    . Incisional hernia repair      Patient Active Problem List   Diagnosis Date Noted  . Hip pain, bilateral 03/25/2014  . Dizziness   . Unsteady gait   . Sinus bradycardia   . Ejection fraction   . Carotid artery disease   . CAD (coronary artery disease)   . Pulmonary nodule, right   . Renal artery stenosis   . Rash   . Cough   . Renal cyst   . Inferior mesenteric artery injury   . ACUTE BRONCHITIS 06/29/2010  . VERTIGO 11/27/2009  . CELLULITIS, LEG, LEFT 12/30/2008  . UNSPECIFIED DISORDER OF LIVER 04/04/2008  . ABDOMINAL PAIN, LEFT LOWER QUADRANT 08/18/2007  . ADENOCARCINOMA, PROSTATE 06/15/2007  . HYPERTHYROIDISM 06/13/2007  . HYPERLIPIDEMIA 06/13/2007  . Essential hypertension 06/13/2007  . ESOPHAGEAL STRICTURE 06/13/2007  . GERD 06/13/2007  . SPINAL  STENOSIS 06/13/2007  . HYPERGLYCEMIA 06/13/2007  . COLONIC POLYPS, HX OF 06/13/2007      Current Outpatient Prescriptions  Medication Sig Dispense Refill  . aspirin 81 MG tablet Take 81 mg by mouth daily.     Marland Kitchen ipratropium (ATROVENT) 0.06 % nasal spray Place 2 sprays into the nose as needed.     Marland Kitchen leuprolide (LUPRON) 3.75 MG injection Given by Dr Darrick Penna -- not sure of dosage    . methimazole (TAPAZOLE) 5 MG tablet TAKE 1 TABLET BY MOUTH 3 TIMES A WEEK 40 tablet 1  . metoprolol tartrate (LOPRESSOR) 25 MG tablet TAKE 1 TABLET (25 MG  TOTAL) BY MOUTH 2 (TWO) TIMES DAILY. 180 tablet 3  . MICRONIZED COLESTIPOL HCL 1 G tablet TAKE 5 TABLETS BY MOUTH EVERY DAY AS DIRECTED 450 tablet 3  . nitroGLYCERIN (NITROSTAT) 0.4 MG SL tablet Place 1 tablet (0.4 mg total) under the tongue every 5 (five) minutes as needed. 25 tablet 3  . olmesartan (BENICAR) 5 MG tablet Take 2 tablets (10 mg total) by mouth daily. 180 tablet 3  . pantoprazole (PROTONIX) 40 MG tablet TAKE 1 TABLET (40 MG TOTAL) BY MOUTH DAILY. 90 tablet 3  . triamcinolone cream (KENALOG) 0.1 %   3  . zolpidem (AMBIEN) 5 MG tablet Take 1 tablet (5 mg total) by mouth at bedtime as needed. for sleep 30 tablet 1  . amLODipine (NORVASC) 2.5 MG tablet Take 1 tablet (2.5 mg total) by mouth daily. 60 tablet 3  . Misc. Devices (QUAD CANE) MISC 1 Device by Does not apply route once. (Patient not taking: Reported on 10/16/2014) 1 each 0   No current facility-administered medications for this visit.    Allergies: Isosorbide; Lisinopril; and Ticlopidine hcl    Social History: The patient  reports that he has never smoked. He does not have any smokeless tobacco history on file. He reports that he does not drink alcohol or use illicit drugs.   Family History: The patient's family history includes Breast cancer in his sister; Pemphigus vulgaris in his mother.    ROS: Please see the history of present illness. Patient denies fever, chills, headache, sweats, rash, change in vision, change in hearing, cough, nausea or vomiting, urinary symptoms. All other systems are reviewed and are negative.   PHYSICAL EXAM: VS: BP 150/72 mmHg  Pulse 72  Ht 5' 10.5" (1.791 m)  Wt 166 lb (75.297 kg)  BMI 23.47 kg/m2 , The patient is stable. He looks a little fatigued today. He is here with his wife. He has kyphosis of the thoracic spine. Head is atraumatic. Sclera and conjunctiva are normal. There is no jugular venous distention. Lungs are clear.  Respiratory effort is not labored. Cardiac exam reveals S1 and S2. The abdomen is soft. There is no peripheral edema. There are no musculoskeletal deformities. There are no skin rashes.  EKG: EKG is done today and reviewed by me. His EKG continues to be normal over the years. There is no change from the past.   Recent Labs: 12/28/2013: TSH 2.55 09/03/2014: ALT 28; BUN 19.9; Creatinine 1.1; Hemoglobin 12.5*; Platelets 187; Potassium 4.2; Sodium 140    Lipid Panel  Labs (Brief)       Component Value Date/Time   CHOL 161 12/28/2013 1204   TRIG 258.0* 12/28/2013 1204   TRIG 137 05/23/2006 0817   HDL 37.50* 12/28/2013 1204   CHOLHDL 4 12/28/2013 1204   CHOLHDL 3.6 CALC 05/23/2006 0817   VLDL 51.6* 12/28/2013 1204  LDLCALC 72 12/28/2013 1204   LDLDIRECT 97.5 06/01/2011 1419       Wt Readings from Last 3 Encounters:  10/16/14 166 lb (75.297 kg)  07/16/14 160 lb (72.576 kg)  07/12/14 162 lb (73.483 kg)      Current medicines are reviewed He understands his medications.     ASSESSMENT AND PLAN:              CAD (coronary artery disease) - Carlena Bjornstad, MD at 10/16/2014 5:29 PM     Status: Written Related Problem: CAD (coronary artery disease)   Expand All Collapse All   His last catheterization was 2008. He is having increasing exertional angina. I have not been able to adjust his medications any further. Today we will try 2.5 mg of amlodipine. I'm concerned that he may have headaches from this. I feel that from the cardiac viewpoint it would be appropriate to proceed with cardiac catheterization. However I need to check with his family to be sure that we are all in agreement about how aggressive to be at this time. He is a very bright gentleman. I believe he is failing a bit. He does have a systolic murmur. There is aortic sclerosis in the past. Echo is to be done to assess LV function and to be sure that he has  not developed significant aortic stenosis. The plan will be to add a small dose of amlodipine and to contact his family. He and his wife hoped to travel soon to the [place]. Unless he is feeling better, I'm not particularly comfortable with his traveling.            Essential hypertension - Carlena Bjornstad, MD at 10/16/2014 5:29 PM     Status: Written Related Problem: Essential hypertension   Expand All Collapse All   Blood pressures controlled. No change in therapy.         Since the patient's office visit, he is had a two-dimensional echo. Left ventricular function is normal. There is mild aortic stenosis. I spoken with the patient's son-in-law who had made decisions representing the family. Everybody is in agreement that the patient is very viable and that we should proceed with cardiac catheterization. I spoke with the patient and he was in agreement. I've made arrangements for him to be present on October 21, 2014 for cardiac catheterization. In addition, I spoke directly with Dr. Burt Knack about the plans.

## 2014-10-21 NOTE — Interval H&P Note (Signed)
History and Physical Interval Note:  10/21/2014 7:42 AM  [name]  has presented today for surgery, with the diagnosis of Chest pain  The various methods of treatment have been discussed with the patient and family. After consideration of risks, benefits and other options for treatment, the patient has consented to  Procedure(s): LEFT HEART CATHETERIZATION WITH CORONARY ANGIOGRAM (N/A) as a surgical intervention .  The patient's history has been reviewed, patient examined, no change in status, stable for surgery.  I have reviewed the patient's chart and labs.  Questions were answered to the patient's satisfaction.    Cath Lab Visit (complete for each Cath Lab visit)  Clinical Evaluation Leading to the Procedure:   ACS: No.  Non-ACS:    Anginal Classification: CCS III  Anti-ischemic medical therapy: Maximal Therapy (2 or more classes of medications)  Non-Invasive Test Results: No non-invasive testing performed  Prior CABG: No previous CABG       Sherren Mocha

## 2014-10-21 NOTE — CV Procedure (Signed)
Cardiac Catheterization Procedure Note  Name: Kurt Elliott MRN: [id number] DOB: 05/01/1921  Procedure: Catheter placement for coronary angiography, Selective Coronary Angiography, PTCA and stenting of the left circumflex, FFR, PTCA, and stenting of the LAD  Indication: CCS class III angina. This 81 year old gentleman has known CAD. He underwent PCI of the LAD and right coronary arteries approximately 10-15 years ago. His last heart catheterization in 2008 demonstrated patency of his stent sites with mild nonobstructive disease elsewhere. Over recent months, he has developed severe exertional angina with low-level activity despite maximal medical therapy. He is referred for cardiac catheterization and possible PCI.  Procedural Details:  The right wrist was prepped, draped, and anesthetized with 1% lidocaine. Using the modified Seldinger technique, a 5/6 French Slender sheath was introduced into the right radial artery. 3 mg of verapamil was administered through the sheath, weight-based unfractionated heparin was administered intravenously. Standard Judkins catheters were used for selective coronary angiography.  Will catheter was used for the right coronary artery. Catheter exchanges were performed over an exchange length guidewire.  PROCEDURAL FINDINGS Hemodynamics: AO 130/63   Coronary angiography: Coronary dominance: right  Left mainstem: The left main is calcified. The vessel has distal 30-40% stenosis.  Left anterior descending (LAD): The LAD is patent to the apex of the heart. The vessel is diffusely calcified. The ostium of the LAD has 40-50% stenosis. The stented segment in the proximal LAD is patent with mild diffuse in-stent restenosis of 40%. The mid LAD is calcified with 75% eccentric stenosis. The diagonal branches are patent. The first diagonal has no obstructive disease. It is medium in caliber. The second diagonal has 75% proximal stenosis. It is small to medium in caliber.  There are multiple large septal perforators from the LAD.  Left circumflex (LCx): The left circumflex has 40-50% stenosis at the ostium. Just at the bifurcation of the AV circumflex and the first obtuse marginal, there is 80% irregular stenosis. The obtuse marginal branch is then diffusely diseased with 30% long segment stenosis through the mid vessel. The distal vessel is patent without stenosis.  Right coronary artery (RCA): This is a large, dominant vessel. The proximal vessel has mild irregular plaquing of 20-30%. The stented segment is patent with diffuse 30-40% in-stent restenosis. The mid RCA has 50% stenosis. The distal RCA has diffuse irregularity. The PDA and PLA branches are patent with mild irregularity.  Left ventriculography: Deferred. LVEF 65-70% by recent echo.   PCI Note:  Following the diagnostic procedure, the decision was made to proceed with PCI of the left circumflex and pressure wire analysis of the LAD.  The patient was given Plavix 600 mg.  Weight-based bivalirudin was given for anticoagulation. Once a therapeutic ACT was achieved, a 6 [place] XB LAD 3.5 cm guide catheter was inserted.  Attention was first turned to the left circumflex. A cougar coronary guidewire was used to cross the lesion.  The lesion was predilated with a 2.5 x 12 mm balloon.  The lesion was then stented with a 3.0 x 12 mm Promus premier DES.  The stent was postdilated with a 3.25 mm noncompliant balloon to 18 atm.  Following PCI, there was 0% residual stenosis and TIMI-3 flow. Attention was then turned to the LAD. The wire was redirected beyond the area of stenosis in the mid LAD.  FFR was performed using a Navvus catheter. After the catheter was equalized at the end of the guide catheter, it was advanced distal to the lesion. With intravenous adenosine,  the FFR at peak hyperemia was 0.76. Attention was then turned to PCI. The lesion was predilated with the same 2.5 x 12 mm balloon on multiple inflations as  there was a long segment of diffuse disease present. Then, with a great deal of difficulty, a 2.5 x 32 mm Promus premier DES was advanced to cover the lesion fully. The stent was deployed at 16 atm and appeared well expanded. The stented segment was then postdilated with a 2.75 x 20 mm noncompliant balloon which was dilated to 20 atm over the entire stented segment. Final angiography confirmed an excellent result. The patient tolerated the procedure well. There were no immediate procedural complications. A TR band was used for radial hemostasis. The patient was transferred to the post catheterization recovery area for further monitoring.  PCI Data: Lesion 1 Vessel - OM1/Segment - prox Percent Stenosis (pre)  80 TIMI-flow 3 Stent 3.0x12 mm Promus DES Percent Stenosis (post) 0 TIMI-flow (post) 3  Lesion 2 Vessel - LAD/Segment - mid Percent Stenosis (pre)  75 TIMI-flow 3 Stent 2.5x32 mm Promus DES Percent Stenosis (post) 0 TIMI-flow (post) 3  Estimated Blood Loss: minimal  Final Conclusions:   1. Three-vessel coronary artery disease with continued patency of the stented segment in the RCA and mild diffuse nonobstructive disease, severe stenosis of the mid circumflex/OM1, and moderately severe diffuse stenosis of the mid LAD with patency of the proximal LAD stents.  2. Successful PCI of the left circumflex using a drug-eluting stent platform  3. Successful pressure wire directed PCI of the mid LAD using a long drug-eluting stent platform   Recommendations:  Dual antiplatelet therapy with aspirin and Plavix at least 12 months. Anticipate hospital discharge tomorrow unless complications arise.  Sherren Mocha MD, [facility] 10/21/2014, 9:34 AM

## 2014-10-22 DIAGNOSIS — E0590 Thyrotoxicosis, unspecified without thyrotoxic crisis or storm: Secondary | ICD-10-CM | POA: Diagnosis not present

## 2014-10-22 DIAGNOSIS — I10 Essential (primary) hypertension: Secondary | ICD-10-CM | POA: Diagnosis not present

## 2014-10-22 DIAGNOSIS — I25119 Atherosclerotic heart disease of native coronary artery with unspecified angina pectoris: Secondary | ICD-10-CM | POA: Diagnosis not present

## 2014-10-22 DIAGNOSIS — I350 Nonrheumatic aortic (valve) stenosis: Secondary | ICD-10-CM | POA: Diagnosis not present

## 2014-10-22 DIAGNOSIS — I208 Other forms of angina pectoris: Secondary | ICD-10-CM | POA: Diagnosis not present

## 2014-10-22 DIAGNOSIS — K219 Gastro-esophageal reflux disease without esophagitis: Secondary | ICD-10-CM | POA: Diagnosis not present

## 2014-10-22 DIAGNOSIS — E785 Hyperlipidemia, unspecified: Secondary | ICD-10-CM | POA: Diagnosis not present

## 2014-10-22 LAB — CBC
HEMATOCRIT: 34.1 % — AB (ref 39.0–52.0)
HEMOGLOBIN: 12 g/dL — AB (ref 13.0–17.0)
MCH: 32.2 pg (ref 26.0–34.0)
MCHC: 35.2 g/dL (ref 30.0–36.0)
MCV: 91.4 fL (ref 78.0–100.0)
Platelets: 172 10*3/uL (ref 150–400)
RBC: 3.73 MIL/uL — AB (ref 4.22–5.81)
RDW: 13 % (ref 11.5–15.5)
WBC: 7.5 10*3/uL (ref 4.0–10.5)

## 2014-10-22 LAB — BASIC METABOLIC PANEL
Anion gap: 8 (ref 5–15)
BUN: 14 mg/dL (ref 6–23)
CALCIUM: 9.5 mg/dL (ref 8.4–10.5)
CO2: 24 mmol/L (ref 19–32)
CREATININE: 1.03 mg/dL (ref 0.50–1.35)
Chloride: 105 mmol/L (ref 96–112)
GFR calc Af Amer: 71 mL/min — ABNORMAL LOW (ref >90)
GFR calc non Af Amer: 61 mL/min — ABNORMAL LOW (ref >90)
Glucose, Bld: 92 mg/dL (ref 70–99)
Potassium: 4.2 mmol/L (ref 3.5–5.1)
SODIUM: 137 mmol/L (ref 135–145)

## 2014-10-22 MED ORDER — CLOPIDOGREL BISULFATE 75 MG PO TABS: 75.0000 mg | ORAL_TABLET | Freq: Every day | ORAL | Status: DC

## 2014-10-22 MED FILL — Sodium Chloride IV Soln 0.9%: INTRAVENOUS | Qty: 50 | Status: AC

## 2014-10-22 NOTE — Progress Notes (Signed)
Labs look okay. Plan discharge later today / AM if no difficulty with ambulation.

## 2014-10-22 NOTE — Discharge Instructions (Signed)
Coronary Angiogram With Stent, Care After Refer to this sheet in the next few weeks. These instructions provide you with information on caring for yourself after your procedure. Your health care provider may also give you more specific instructions. Your treatment has been planned according to current medical practices, but problems sometimes occur. Call your health care provider if you have any problems or questions after your procedure.  WHAT TO EXPECT AFTER THE PROCEDURE  The insertion site may be tender for a few days after your procedure. HOME CARE INSTRUCTIONS   Take medicines only as directed by your health care provider. Blood thinners may be prescribed after your procedure to improve blood flow through the stent.  Change any bandages (dressings) as directed by your health care provider.   Check your insertion site every day for redness, swelling, or fluid leaking from the insertion.   Do not take baths, swim, or use a hot tub until your health care provider approves. You may shower. Pat the insertion area dry. Do not rub the insertion area with a washcloth or towel.   Eat a heart-healthy diet. This should include plenty of fresh fruits and vegetables. Meat should be lean cuts. Avoid the following types of food:   Food that is high in salt.   Canned or highly processed food.   Food that is high in saturated fat or sugar.   Fried food.   Make any other lifestyle changes recommended by your health care provider. This may include:   Not using any tobacco products including cigarettes, chewing tobacco, or electronic cigarettes.  Managing your weight.   Getting regular exercise.   Managing your blood pressure.   Limiting your alcohol intake.   Managing other health problems, such as diabetes.   If you need an MRI after your heart stent was placed, be sure to tell the health care provider who orders the MRI that you have a heart stent.   Keep all follow-up  visits as directed by your health care provider.  SEEK IMMEDIATE MEDICAL CARE IF:   You develop chest pain, shortness of breath, feel faint, or pass out.  You have bleeding, swelling larger than a walnut, or drainage from the catheter insertion site.  You develop pain, discoloration, coldness, or severe bruising in the leg or arm that held the catheter.  You develop bleeding from any other place such as from the bowels. There may be bright red blood in the urine or stools, or it may appear as black, tarry stools.  You have a fever or chills. MAKE SURE YOU:  Understand these instructions.  Will watch your condition.  Will get help right away if you are not doing well or get worse. Document Released: 01/29/2005 Document Revised: 11/26/2013 Document Reviewed: 12/13/2012 [facility] Patient Information 2015 [place], [place]. This information is not intended to replace advice given to you by your health care provider. Make sure you discuss any questions you have with your health care provider.  No driving. No lifting over 5 lbs for 1 week. No sexual activity for 1 week. Keep procedure site clean & dry. If you notice increased pain, swelling, bleeding or pus, call/return!  You may shower, but no soaking baths/hot tubs/pools for 1 week.

## 2014-10-22 NOTE — Progress Notes (Signed)
CARDIAC REHAB PHASE I   PRE:  Rate/Rhythm: 79 SR  BP:  Supine: 130/60  Sitting:   Standing:    SaO2:   MODE:  Ambulation: 400 ft   POST:  Rate/Rhythm: 101  BP:  Supine:   Sitting: 169/59  Standing:    SaO2:  0810-0902 Pt walked 400 ft with his cane and minimal asst. Gait slow and steady. No CP. Reviewed importance of plavix with stents and reviewed NTG use. Encouraged to watch sodium and gave heart healthy diet for review if interested. Encouraged walking as tolerated but did not give written ex ed as pt stated mainly walks in house. Stated he has done CRP 2 before and agreed to referral to [facility] as he will consider. To recliner with call bell. RN stated family has his stent booklet.   Graylon Good, RN BSN  10/22/2014 8:59 AM

## 2014-10-22 NOTE — Progress Notes (Signed)
Patient Name: Kurt Elliott Date of Encounter: 10/22/2014     Active Problems:   Exertional angina    SUBJECTIVE  Denies any CP or SOB.   CURRENT MEDS . amLODipine  2.5 mg Oral QHS  . aspirin EC  81 mg Oral Daily  . clopidogrel  75 mg Oral Q breakfast  . irbesartan  75 mg Oral Daily  . metoprolol tartrate  25 mg Oral BID  . pantoprazole  40 mg Oral Daily  . sodium chloride  3 mL Intravenous Q12H    OBJECTIVE  Filed Vitals:   10/21/14 1900 10/21/14 2308 10/22/14 0000 10/22/14 0357  BP: 171/55 184/63 158/51 145/60  Pulse: 79 71 71 77  Temp: 98.1 F (36.7 C) 98.5 F (36.9 C)  98.3 F (36.8 C)  TempSrc: Oral Oral  Oral  Resp: 20 20 18 16   Height:      Weight:    165 lb 9.1 oz (75.1 kg)  SpO2: 97% 96% 94% 97%    Intake/Output Summary (Last 24 hours) at 10/22/14 0658 Last data filed at 10/22/14 0100  Gross per 24 hour  Intake 843.66 ml  Output    820 ml  Net  23.66 ml   Filed Weights   10/21/14 0632 10/22/14 0357  Weight: 166 lb (75.297 kg) 165 lb 9.1 oz (75.1 kg)    PHYSICAL EXAM  General: Pleasant, NAD. Neuro: Alert and oriented X 3. Moves all extremities spontaneously. Psych: Normal affect. HEENT:  Normal  Neck: Supple without bruits or JVD. Lungs:  Resp regular and unlabored, CTA.  Heart: RRR no s3, s4, or murmurs. R radial cath stable. Abdomen: Soft, non-tender, non-distended, BS + x 4.  Extremities: No clubbing, cyanosis or edema. DP/PT/Radials 2+ and equal bilaterally.  Accessory Clinical Findings  CBC  Recent Labs  10/22/14 0328  WBC 7.5  HGB 12.0*  HCT 34.1*  MCV 91.4  PLT 633   Basic Metabolic Panel  Recent Labs  10/22/14 0328  NA 137  K 4.2  CL 105  CO2 24  GLUCOSE 92  BUN 14  CREATININE 1.03  CALCIUM 9.5    TELE NSR with HR 70-80s    ECG  NSR without significant ST-T wave changes  Echocardiogram 10/17/2014  LV EF: 65% -   70%  ------------------------------------------------------------------- Indications:   R01.1 Murmur.  ------------------------------------------------------------------- History:  PMH: Acquired from the patient and from the patient&'s chart. PMH: CAD. Bradycardia. Renal Artery Stenosis. Dizziness. Spinal Stenosis. Hyperthyroidism. Risk factors: Hypertension. Dyslipidemia.  ------------------------------------------------------------------- Study Conclusions  - Left ventricle: The cavity size was normal. There was mild concentric hypertrophy. Systolic function was vigorous. The estimated ejection fraction was in the range of 65% to 70%. Wall motion was normal; there were no regional wall motion abnormalities. Doppler parameters are consistent with abnormal left ventricular relaxation (grade 1 diastolic dysfunction). - Aortic valve: Cusp separation was reduced. There was mild stenosis. Peak velocity (S): 247 cm/s. Mean gradient (S): 12 mm Hg. - Right ventricle: The cavity size was mildly dilated. Wall thickness was normal.    Radiology/Studies  No results found.  ASSESSMENT AND PLAN  1. Class III angina  - cath 10/21/2014 3v dx with continued patency of RCA stent, severe stenosis of mid LCx/OM1 s/p DES, moderately severe diffuse mid LAD treated with DES, patent prox LAD stent.   - continue ASA, plavix, ARB, amlodipine, and metoprolol. Allergic to ACEI and Imdur. SBP still high. Adjust as outpatient.   - stable for discharge today, will  need medical note to cancel next week's trip  2. CAD  - last cath 2008  3. HTN 4. Carotid artery disease  Signed, Almyra Deforest PA-C Pager: [phone]

## 2014-10-22 NOTE — Discharge Summary (Signed)
Discharge Summary   Patient ID: Kurt Elliott,  MRN: [id number], DOB/AGE: 01/03/1921 81 y.o.  Admit date: 10/21/2014 Discharge date: 10/22/2014  Primary Care Provider: Renato Shin Primary Cardiologist: Dr. Ron Parker  Discharge Diagnoses Principal Problem:   Exertional angina Active Problems:   Hyperlipidemia   Essential hypertension   GERD   CAD (coronary artery disease)   Allergies Allergies  Allergen Reactions  . Isosorbide Other (See Comments)    "severe Headache" per patient  . Lisinopril Cough  . Ticlopidine Hcl Rash    Procedures  Cardiac catheterization 10/21/2014 Final Conclusions:  1. Three-vessel coronary artery disease with continued patency of the stented segment in the RCA and mild diffuse nonobstructive disease, severe stenosis of the mid circumflex/OM1, and moderately severe diffuse stenosis of the mid LAD with patency of the proximal LAD stents.  2. Successful PCI of the left circumflex using a drug-eluting stent platform  3. Successful pressure wire directed PCI of the mid LAD using a long drug-eluting stent platform   Recommendations:  Dual antiplatelet therapy with aspirin and Plavix at least 12 months. Anticipate hospital discharge tomorrow unless complications arise.     Hospital Course  The patient is a 81 year old male with past medical history of CAD with prior PCI in 2008, hypertension and hyperlipidemia. He presented to the office on 10/18/2014 for cardiology follow-up at which time he complained about increasing exertional angina. He was already on at least 2 anti-anginal medications. After discussing various options, it was decided for the patient to undergo cardiac catheterization, he agreed to proceed with the procedure.  He underwent a scheduled cardiac catheterization on 10/13/2014 which showed three-vessel coronary artery disease with patent RCA stent, patent proximal LAD stent, severe stenosis in mid left circumflex/OM 1 treated with  3.012 mm Promus DES, moderately severe stenosis in mid LAD treated with 2.532 mm Promus DES.  Post procedure, patient was kept in the hospital for observation overnight. He was seen on the following morning on 10/22/2014, at which time he denies any significant chest discomfort or shortness of breath. Post cath labs shows stable renal function. His right radial cath site appears to be stable. He is deemed stable for discharge from cardiology perspective. He was originally scheduled to travel out of state next week, and has requested medical letter in order to cancel the travel. I will arrange close follow-up with Dr. Ron Parker office in 2-4 weeks. Further emphasis has been placed on compliance with dual antiplatelet medication.   Discharge Vitals Blood pressure 145/60, pulse 77, temperature 98.3 F (36.8 C), temperature source Oral, resp. rate 16, height 5' 10.5" (1.791 m), weight 165 lb 9.1 oz (75.1 kg), SpO2 97 %.  Filed Weights   10/21/14 0630 10/22/14 0357  Weight: 166 lb (75.297 kg) 165 lb 9.1 oz (75.1 kg)    Labs  CBC  Recent Labs  10/22/14 0328  WBC 7.5  HGB 12.0*  HCT 34.1*  MCV 91.4  PLT 160   Basic Metabolic Panel  Recent Labs  10/22/14 0328  NA 137  K 4.2  CL 105  CO2 24  GLUCOSE 92  BUN 14  CREATININE 1.03  CALCIUM 9.5    Disposition  Pt is being discharged home today in good condition.  Follow-up Plans & Appointments      Follow-up Information    Follow up with Murray Hodgkins, NP On 11/12/2014.   Specialty:  Nurse Practitioner   Why:  3:00pm   Contact information:   [address] [facility] [address]  [address] [place] [address] [phone]       Discharge Medications    Medication List    TAKE these medications        amLODipine 2.5 MG tablet  Commonly known as:  NORVASC  Take 1 tablet (2.5 mg total) by mouth daily.     aspirin EC 81 MG tablet  Take 81 mg by mouth daily.     clopidogrel 75 MG tablet  Commonly known as:  PLAVIX  Take 1  tablet (75 mg total) by mouth daily with breakfast.     LUPRON DEPOT IM  Inject into the muscle. Done at [place] office approx once every 3 months     methimazole 5 MG tablet  Commonly known as:  TAPAZOLE  TAKE 1 TABLET BY MOUTH 3 TIMES A WEEK     metoprolol tartrate 25 MG tablet  Commonly known as:  LOPRESSOR  TAKE 1 TABLET (25 MG TOTAL) BY MOUTH 2 (TWO) TIMES DAILY.     MICRONIZED COLESTIPOL HCL 1 G tablet  Generic drug:  colestipol  TAKE 5 TABLETS BY MOUTH EVERY DAY AS DIRECTED     nitroGLYCERIN 0.4 MG SL tablet  Commonly known as:  NITROSTAT  Place 1 tablet (0.4 mg total) under the tongue every 5 (five) minutes as needed.     olmesartan 5 MG tablet  Commonly known as:  BENICAR  Take 2 tablets (10 mg total) by mouth daily.     pantoprazole 40 MG tablet  Commonly known as:  PROTONIX  TAKE 1 TABLET (40 MG TOTAL) BY MOUTH DAILY.     [employer] Misc  1 Device by Does not apply route once.     triamcinolone cream 0.1 %  Commonly known as:  KENALOG  Apply 1 application topically daily.     zolpidem 5 MG tablet  Commonly known as:  AMBIEN  Take 1 tablet (5 mg total) by mouth at bedtime as needed. for sleep        Duration of Discharge Encounter   Greater than 30 minutes including physician time.  Hilbert Corrigan PA-C Pager: [phone] 10/22/2014, 8:55 AM

## 2014-10-22 NOTE — Progress Notes (Signed)
TR BAND REMOVAL  LOCATION:    right radial  DEFLATED PER PROTOCOL:    Yes.    TIME BAND OFF / DRESSING APPLIED:    1400   SITE UPON ARRIVAL:    Level 0  SITE AFTER BAND REMOVAL:    Level 0  REVERSE ALLEN'S TEST:     positive  CIRCULATION SENSATION AND MOVEMENT:    Within Normal Limits   Yes.    COMMENTS:  Rebled site after 2nd time of  air draw  from TRB, otherwise tolerated procedure well

## 2014-10-24 ENCOUNTER — Encounter: Payer: Self-pay | Admitting: Physician Assistant

## 2014-10-31 ENCOUNTER — Telehealth (HOSPITAL_COMMUNITY): Payer: Self-pay | Admitting: *Deleted

## 2014-10-31 NOTE — Telephone Encounter (Signed)
Received signed order for pt to attend cardiac rehab. Message left for pt to please contact cardiac rehab to sign up.  Contact information provided.

## 2014-11-07 ENCOUNTER — Telehealth (HOSPITAL_COMMUNITY): Payer: Self-pay | Admitting: *Deleted

## 2014-11-07 NOTE — Telephone Encounter (Signed)
Patient not available to speak with rehab staff. Spoke to wife who requested rehab to call back again later. Cherre Huger, BSN

## 2014-11-12 ENCOUNTER — Encounter: Payer: Self-pay | Admitting: Nurse Practitioner

## 2014-11-12 ENCOUNTER — Ambulatory Visit (INDEPENDENT_AMBULATORY_CARE_PROVIDER_SITE_OTHER): Payer: Medicare Other | Admitting: Nurse Practitioner

## 2014-11-12 VITALS — BP 156/62 | HR 70 | Ht 70.0 in | Wt 164.0 lb

## 2014-11-12 DIAGNOSIS — I2510 Atherosclerotic heart disease of native coronary artery without angina pectoris: Secondary | ICD-10-CM | POA: Diagnosis not present

## 2014-11-12 DIAGNOSIS — E785 Hyperlipidemia, unspecified: Secondary | ICD-10-CM | POA: Diagnosis not present

## 2014-11-12 DIAGNOSIS — I10 Essential (primary) hypertension: Secondary | ICD-10-CM

## 2014-11-12 DIAGNOSIS — I25118 Atherosclerotic heart disease of native coronary artery with other forms of angina pectoris: Secondary | ICD-10-CM | POA: Diagnosis not present

## 2014-11-12 MED ORDER — AMLODIPINE BESYLATE 5 MG PO TABS: 5.0000 mg | ORAL_TABLET | Freq: Every day | ORAL | Status: DC

## 2014-11-12 MED ORDER — NITROGLYCERIN 0.4 MG SL SUBL: 0.4000 mg | SUBLINGUAL_TABLET | SUBLINGUAL | Status: DC | PRN

## 2014-11-12 NOTE — Progress Notes (Signed)
Patient Name: Kurt Elliott Date of Encounter: 11/12/2014  Primary Care Provider:  Renato Shin, MD Primary Cardiologist:  Veatrice Bourbon, MD  Chief Complaint  81 year old male status post recent percutaneous intervention who presents for follow-up.  Past Medical History   Past Medical History  Diagnosis Date  . Hypertension   . Hyperlipemia   . Unspecified disorder of liver   . Cellulitis, leg     left  . Pulmonary nodule, right     2008 / unchanged CT scan, July, 2009  . Abdominal pain, left lower quadrant   . Adenocarcinoma of prostate   . Esophageal stricture   . Spinal stenosis   . Hyperglycemia   . Hyperthyroidism   . GERD (gastroesophageal reflux disease)   . Hx of colonic polyps   . Renal artery stenosis     Mild  . Atheroma     no penetrating ulcer, Abdominal aorta  . Inferior mesenteric artery injury     High-grade stenosis, CT scan, 2006  . Vertigo   . Diverticulum   . Incisional hernia   . Rash     Ticlid  . Cough     ACE Inhibitor, tolerates ARB  . Carotid artery disease     Doppler, 2006, minimal disease  //  Doppler, May, 2012, mild increase velocities, 40-59% bilateral stenoses  . Renal cyst     Hyperdense cyst lower pole left kidney  . Dizziness   . Unsteady gait   . Sinus bradycardia   . CAD (coronary artery disease), native coronary artery     a. cath 2008  b. cath 10/21/2014 3v dzs w/ patent RCA stent, severe stenosis of mid LCx/OM1 s/p DES, moderately severe diffuse mid LAD treated with DES, patent prox LAD stent  . Mild aortic stenosis     a. 09/2014 Echo: EF 65-70%, Gr 1 DD, no rwma, mild AS.   Past Surgical History  Procedure Laterality Date  . Esophagogastroduodenoscopy    . Cardiac catheterization    . Incisional hernia repair    . Left heart catheterization with coronary angiogram N/A 10/21/2014    Procedure: LEFT HEART CATHETERIZATION WITH CORONARY ANGIOGRAM;  Surgeon: Sherren Mocha, MD;  Location: [facility] CATH LAB;  Service:  Cardiovascular;  Laterality: N/A;  . Percutaneous coronary stent intervention (pci-s)  10/21/2014    Procedure: PERCUTANEOUS CORONARY STENT INTERVENTION (PCI-S);  Surgeon: Sherren Mocha, MD;  Location: [facility] CATH LAB;  Service: Cardiovascular;;  Mid LAD and OM1    Allergies  Allergies  Allergen Reactions  . Isosorbide Other (See Comments)    "severe Headache" per patient  . Lisinopril Cough  . Ticlopidine Hcl Rash    HPI  81 year old male with the above complex problem list. He was seen by Dr. Ron Parker in clinic on March 23 with complaints of increasing dyspnea on exertion. Decision was made to pursue diagnostic catheterization. This was performed on March 28 and revealed severe left circumflex and mid LAD disease. Previously placed stents in the proximal LAD and right coronary artery were patent. EF was 65-70%. Following catheterization, both the LAD and left circumflex were successfully treated with Promus drug eluting stents. Patient tolerated procedure well and was discharged the next day. Since discharge, he reports doing well. He has not had any recurrent chest pain or dyspnea on exertion. He has been somewhat fatigued but also notes he hasn't gotten back to his usual activities. He is interested in getting back into the gym for light exercise. He denies PND,  orthopnea, dizziness, syncope, edema, or early satiety.  Home Medications  Prior to Admission medications   Medication Sig Start Date End Date Taking? Authorizing Provider  aspirin EC 81 MG tablet Take 81 mg by mouth daily.   Yes Historical Provider, MD  clopidogrel (PLAVIX) 75 MG tablet Take 1 tablet (75 mg total) by mouth daily with breakfast. 10/22/14  Yes Almyra Deforest, PA  Leuprolide Acetate (LUPRON DEPOT IM) Inject into the muscle. Done at [place] office approx once every 3 months   Yes Historical Provider, MD  methimazole (TAPAZOLE) 5 MG tablet TAKE 1 TABLET BY MOUTH 3 TIMES A WEEK Patient taking differently: TAKE 1 TABLET BY MOUTH 3  TIMES A WEEK - MONDAY, [facility] AND FRIDAY 08/01/14  Yes Renato Shin, MD  metoprolol tartrate (LOPRESSOR) 25 MG tablet TAKE 1 TABLET (25 MG TOTAL) BY MOUTH 2 (TWO) TIMES DAILY. 07/22/14  Yes Carlena Bjornstad, MD  MICRONIZED COLESTIPOL HCL 1 G tablet TAKE 5 TABLETS BY MOUTH EVERY DAY AS DIRECTED 07/15/14  Yes Renato Shin, MD  Misc. Devices (QUAD CANE) MISC 1 Device by Does not apply route once. 03/13/12  Yes Renato Shin, MD  nitroGLYCERIN (NITROSTAT) 0.4 MG SL tablet Place 1 tablet (0.4 mg total) under the tongue every 5 (five) minutes as needed. 11/12/14  Yes Rogelia Mire, NP  olmesartan (BENICAR) 5 MG tablet Take 2 tablets (10 mg total) by mouth daily. 12/11/13  Yes Carlena Bjornstad, MD  pantoprazole (PROTONIX) 40 MG tablet TAKE 1 TABLET (40 MG TOTAL) BY MOUTH DAILY. 03/15/13  Yes Inda Castle, MD  triamcinolone cream (KENALOG) 0.1 % Apply 1 application topically daily.  04/10/14  Yes Historical Provider, MD  zolpidem (AMBIEN) 5 MG tablet Take 1 tablet (5 mg total) by mouth at bedtime as needed. for sleep Patient taking differently: Take 2.5 mg by mouth at bedtime as needed for sleep.  07/22/14  Yes Philemon Kingdom, MD  amLODipine (NORVASC) 5 MG tablet Take 1 tablet (5 mg total) by mouth daily. 11/12/14   Rogelia Mire, NP    Review of Systems  As above, he has been somewhat fatigued but overall doing well. He denies chest pain, palpitations, dyspnea, pnd, orthopnea, n, v, dizziness, syncope, edema, weight gain, or early satiety.  All other systems reviewed and are otherwise negative except as noted above.  Physical Exam  VS:  BP 156/62 mmHg  Pulse 70  Ht 5\' 10"  (1.778 m)  Wt 164 lb (74.39 kg)  BMI 23.53 kg/m2  SpO2 97% , BMI Body mass index is 23.53 kg/(m^2). GEN: Well nourished, well developed, in no acute distress. HEENT: normal. Neck: Supple, no JVD, carotid bruits, or masses. Cardiac: RRR, no rubs, or gallops. 2/6 systolic ejection murmur heard throughout but last at the  apex. No clubbing, cyanosis, edema.  The right wrist catheterization site is without bleeding, bruit, or hematoma. Radials/DP/PT 2+ and equal bilaterally.  Respiratory:  Respirations regular and unlabored, clear to auscultation bilaterally. GI: Soft, nontender, nondistended, BS + x 4. MS: no deformity or atrophy. Skin: warm and dry, no rash. Neuro:  Strength and sensation are intact. Psych: Normal affect.  Accessory Clinical Findings  ECG - regular sinus rhythm/sinus arrhythmia, 71, first-degree AV block, no acute ST or T changes.  Assessment & Plan  1.  Coronary artery disease: Status post recent catheterization revealing severe LAD and circumflex disease with patent proximal LAD and RCA stents. New disease in the LAD and circumflex were successfully treated using Promus drug  stents. He has done well since his procedure without recurrence of chest pain or dyspnea. He has remained on aspirin, Plavix, and beta blocker therapy and is tolerating well. He is not on a statin but does take colestipol. We have refilled his nitroglycerin today.  Though he does not plan to partake in cardiac rehabilitation, he does plan to return to the gym and resume his previous exercise levels.  2. Hypertension: Blood pressure is elevated today at 156/62 initially and 160/68 on repeat. He is not sure but thinks his blood pressure runs high at home as well. I am increasing his amlodipine to 5 mg daily and we have provided him with a 3 month refill today.  3. Hyperlipidemia: He is on colestipol. Last LDL in June 2015 was 72.  4. Disposition: He will follow with Dr. Ron Parker in 3 months or sooner if necessary.    Murray Hodgkins, NP 11/12/2014, 3:35 PM

## 2014-11-12 NOTE — Patient Instructions (Signed)
Medication Instructions:  Your physician has recommended you make the following change in your medication:   INCREASE Amlodipine to 5 mg by mouth daily  Labwork: NONE  Testing/Procedures: NONE  Follow-Up: Your physician wants you to follow-up in: 3 months with Dr. Ron Parker. You will receive a reminder letter in the mail two months in advance. If you don't receive a letter, please call our office to schedule the follow-up appointment.  Any Other Special Instructions Will Be Listed Below (If Applicable).

## 2014-11-28 ENCOUNTER — Other Ambulatory Visit (HOSPITAL_COMMUNITY): Payer: Self-pay | Admitting: Dermatology

## 2014-11-28 DIAGNOSIS — D485 Neoplasm of uncertain behavior of skin: Secondary | ICD-10-CM | POA: Diagnosis not present

## 2014-11-28 DIAGNOSIS — B359 Dermatophytosis, unspecified: Secondary | ICD-10-CM | POA: Diagnosis not present

## 2014-11-28 DIAGNOSIS — C44319 Basal cell carcinoma of skin of other parts of face: Secondary | ICD-10-CM | POA: Diagnosis not present

## 2014-11-28 DIAGNOSIS — C44310 Basal cell carcinoma of skin of unspecified parts of face: Secondary | ICD-10-CM | POA: Diagnosis not present

## 2014-11-29 ENCOUNTER — Telehealth: Payer: Self-pay

## 2014-11-29 NOTE — Telephone Encounter (Signed)
Patient called wanting to know if we can change his Micronized Colestipol. Pt states this medication is causing severe constipation and wanted to know if there is an alternative.  Thanks!

## 2014-11-30 NOTE — Telephone Encounter (Signed)
No alternative. Please try reducing to 2 tabs daily

## 2014-12-02 NOTE — Telephone Encounter (Signed)
I contacted pt's wife. She was advised of the note below and voiced understanding. She is going to advise patient and he will call our office back if he has any questions.

## 2014-12-03 ENCOUNTER — Other Ambulatory Visit (HOSPITAL_BASED_OUTPATIENT_CLINIC_OR_DEPARTMENT_OTHER): Payer: Medicare Other

## 2014-12-03 DIAGNOSIS — C61 Malignant neoplasm of prostate: Secondary | ICD-10-CM | POA: Diagnosis not present

## 2014-12-03 LAB — CBC WITH DIFFERENTIAL/PLATELET
BASO%: 0.7 % (ref 0.0–2.0)
Basophils Absolute: 0 10*3/uL (ref 0.0–0.1)
EOS%: 4.8 % (ref 0.0–7.0)
Eosinophils Absolute: 0.3 10*3/uL (ref 0.0–0.5)
HCT: 30.7 % — ABNORMAL LOW (ref 38.4–49.9)
HGB: 10.7 g/dL — ABNORMAL LOW (ref 13.0–17.1)
LYMPH%: 22.1 % (ref 14.0–49.0)
MCH: 32.8 pg (ref 27.2–33.4)
MCHC: 35 g/dL (ref 32.0–36.0)
MCV: 93.8 fL (ref 79.3–98.0)
MONO#: 0.4 10*3/uL (ref 0.1–0.9)
MONO%: 6.5 % (ref 0.0–14.0)
NEUT%: 65.9 % (ref 39.0–75.0)
NEUTROS ABS: 4.4 10*3/uL (ref 1.5–6.5)
Platelets: 195 10*3/uL (ref 140–400)
RBC: 3.28 10*6/uL — AB (ref 4.20–5.82)
RDW: 13.4 % (ref 11.0–14.6)
WBC: 6.7 10*3/uL (ref 4.0–10.3)
lymph#: 1.5 10*3/uL (ref 0.9–3.3)

## 2014-12-03 LAB — COMPREHENSIVE METABOLIC PANEL (CC13)
ALBUMIN: 3.7 g/dL (ref 3.5–5.0)
ALT: 26 U/L (ref 0–55)
AST: 39 U/L — ABNORMAL HIGH (ref 5–34)
Alkaline Phosphatase: 103 U/L (ref 40–150)
Anion Gap: 12 mEq/L — ABNORMAL HIGH (ref 3–11)
BUN: 21.1 mg/dL (ref 7.0–26.0)
CALCIUM: 9.3 mg/dL (ref 8.4–10.4)
CO2: 23 mEq/L (ref 22–29)
Chloride: 107 mEq/L (ref 98–109)
Creatinine: 1.2 mg/dL (ref 0.7–1.3)
EGFR: 54 mL/min/{1.73_m2} — ABNORMAL LOW (ref >90)
GLUCOSE: 92 mg/dL (ref 70–140)
Potassium: 3.9 mEq/L (ref 3.5–5.1)
SODIUM: 142 meq/L (ref 136–145)
Total Bilirubin: 1.54 mg/dL — ABNORMAL HIGH (ref 0.20–1.20)
Total Protein: 7.1 g/dL (ref 6.4–8.3)

## 2014-12-04 LAB — TESTOSTERONE: Testosterone: 84 ng/dL — ABNORMAL LOW (ref 300–890)

## 2014-12-04 LAB — PSA: PSA: 0.79 ng/mL (ref <=4.00)

## 2014-12-05 ENCOUNTER — Telehealth: Payer: Self-pay | Admitting: Cardiology

## 2014-12-05 ENCOUNTER — Other Ambulatory Visit: Payer: Self-pay | Admitting: Oncology

## 2014-12-05 ENCOUNTER — Encounter: Payer: Self-pay | Admitting: Oncology

## 2014-12-05 NOTE — Telephone Encounter (Signed)
The pt is advised that he will, more than likely, be on Plavix for at least a year after stent placement. He verbalized understanding and thanked me for my quick assistance.

## 2014-12-05 NOTE — Telephone Encounter (Signed)
New message      Pt recently had stents placed.  How long should he take th plavix?

## 2015-01-07 ENCOUNTER — Other Ambulatory Visit: Payer: Self-pay | Admitting: Oncology

## 2015-01-07 ENCOUNTER — Ambulatory Visit (HOSPITAL_BASED_OUTPATIENT_CLINIC_OR_DEPARTMENT_OTHER): Payer: Medicare Other

## 2015-01-07 ENCOUNTER — Other Ambulatory Visit: Payer: Self-pay | Admitting: *Deleted

## 2015-01-07 VITALS — BP 154/48 | HR 67 | Temp 97.8°F

## 2015-01-07 DIAGNOSIS — Z5111 Encounter for antineoplastic chemotherapy: Secondary | ICD-10-CM | POA: Diagnosis present

## 2015-01-07 DIAGNOSIS — C61 Malignant neoplasm of prostate: Secondary | ICD-10-CM | POA: Diagnosis not present

## 2015-01-07 MED ORDER — LEUPROLIDE ACETATE (3 MONTH) 22.5 MG IM KIT
22.5000 mg | PACK | Freq: Once | INTRAMUSCULAR | Status: AC
  Administered 2015-01-07: 22.5 mg via INTRAMUSCULAR
  Filled 2015-01-07: qty 22.5

## 2015-01-08 ENCOUNTER — Ambulatory Visit: Payer: Medicare Other

## 2015-01-13 ENCOUNTER — Telehealth: Payer: Self-pay | Admitting: Endocrinology

## 2015-01-13 MED ORDER — ZOLPIDEM TARTRATE 5 MG PO TABS: 5.0000 mg | ORAL_TABLET | Freq: Every evening | ORAL | Status: DC | PRN

## 2015-01-13 NOTE — Telephone Encounter (Signed)
[facility] note dated 01/11/15 at 11:15 AM Prescription Refill or Medication Request (non symptomatic) Initial Comment Caller States he needs a refill on his med. no Symptoms  Please select the assessment type ---Refill Additional Documentation ---Caller States he needs a refill on his med. no symptoms. Reports he is out of his Zolpidem for sleep. Does the patient have enough medication to last until the office opens? ---Yes Additional Documentation ---Reports that he has has 2 pills left. Advised to call the office on Monday for this request.

## 2015-01-13 NOTE — Telephone Encounter (Signed)
Pt is requesting a refill on his Ambien 5 mg. Last rx was written on 07/22/2014. Please advise if ok to refill rx during Dr. Cordelia Pen absence.  Thanks!

## 2015-01-13 NOTE — Telephone Encounter (Signed)
Rx refilled for 10 tablets and faxed to pt's pharmacy.

## 2015-01-13 NOTE — Addendum Note (Signed)
Addended by: Moody Bruins E on: 01/13/2015 02:33 PM   Modules accepted: Orders

## 2015-01-13 NOTE — Telephone Encounter (Signed)
Prescribe 10 tablets only

## 2015-01-15 ENCOUNTER — Other Ambulatory Visit: Payer: Self-pay | Admitting: Endocrinology

## 2015-01-20 ENCOUNTER — Telehealth: Payer: Self-pay | Admitting: Endocrinology

## 2015-01-20 MED ORDER — ZALEPLON 5 MG PO CAPS: 5.0000 mg | ORAL_CAPSULE | Freq: Every evening | ORAL | Status: DC | PRN

## 2015-01-20 NOTE — Telephone Encounter (Signed)
i printed 

## 2015-01-20 NOTE — Telephone Encounter (Signed)
Pt advised rx has been sent and voiced understanding.

## 2015-01-20 NOTE — Telephone Encounter (Signed)
I contacted the pt. He is requesting a refill on his Sonata 5 mg. Please advise if ok to refill this medication. Pt states he likes this medication better than the Ambien he was on. Please advise, Thanks!

## 2015-01-20 NOTE — Telephone Encounter (Signed)
Per pt please call pt regarding medication refillls

## 2015-02-02 ENCOUNTER — Other Ambulatory Visit: Payer: Self-pay | Admitting: Cardiology

## 2015-02-03 ENCOUNTER — Other Ambulatory Visit: Payer: Self-pay

## 2015-02-03 MED ORDER — OLMESARTAN MEDOXOMIL 5 MG PO TABS: 10.0000 mg | ORAL_TABLET | Freq: Every day | ORAL | Status: DC

## 2015-02-04 ENCOUNTER — Encounter: Payer: Self-pay | Admitting: Gastroenterology

## 2015-02-04 DIAGNOSIS — C61 Malignant neoplasm of prostate: Secondary | ICD-10-CM

## 2015-02-04 NOTE — Progress Notes (Unsigned)
Patient ID: Kurt Elliott, male   DOB: 1920-12-17, 81 y.o.   MRN: [id number]  C/o fatigue, lack of energy.  Note last Hg 10.2  Plan cbc, cmet,  psa, testosterone level

## 2015-02-05 ENCOUNTER — Other Ambulatory Visit: Payer: Self-pay | Admitting: *Deleted

## 2015-02-05 ENCOUNTER — Telehealth: Payer: Self-pay | Admitting: Endocrinology

## 2015-02-05 ENCOUNTER — Other Ambulatory Visit (INDEPENDENT_AMBULATORY_CARE_PROVIDER_SITE_OTHER): Payer: Medicare Other

## 2015-02-05 ENCOUNTER — Telehealth: Payer: Self-pay | Admitting: *Deleted

## 2015-02-05 DIAGNOSIS — C61 Malignant neoplasm of prostate: Secondary | ICD-10-CM

## 2015-02-05 DIAGNOSIS — N289 Disorder of kidney and ureter, unspecified: Secondary | ICD-10-CM

## 2015-02-05 DIAGNOSIS — D509 Iron deficiency anemia, unspecified: Secondary | ICD-10-CM

## 2015-02-05 DIAGNOSIS — N1339 Other hydronephrosis: Secondary | ICD-10-CM

## 2015-02-05 LAB — TESTOSTERONE: TESTOSTERONE: 24.89 ng/dL — AB (ref 300.00–890.00)

## 2015-02-05 LAB — COMPREHENSIVE METABOLIC PANEL
ALBUMIN: 4.1 g/dL (ref 3.5–5.2)
ALK PHOS: 86 U/L (ref 39–117)
ALT: 26 U/L (ref 0–53)
AST: 42 U/L — AB (ref 0–37)
BILIRUBIN TOTAL: 1.5 mg/dL — AB (ref 0.2–1.2)
BUN: 34 mg/dL — AB (ref 6–23)
CHLORIDE: 107 meq/L (ref 96–112)
CO2: 25 mEq/L (ref 19–32)
CREATININE: 1.35 mg/dL (ref 0.40–1.50)
Calcium: 10 mg/dL (ref 8.4–10.5)
GFR: 52.5 mL/min — AB (ref >60.00)
Glucose, Bld: 129 mg/dL — ABNORMAL HIGH (ref 70–99)
Potassium: 4.9 mEq/L (ref 3.5–5.1)
Sodium: 139 mEq/L (ref 135–145)
TOTAL PROTEIN: 7.3 g/dL (ref 6.0–8.3)

## 2015-02-05 LAB — PSA: PSA: 0.73 ng/mL (ref 0.10–4.00)

## 2015-02-05 NOTE — Telephone Encounter (Signed)
Pt coming in to for a appointment on 02/06/15 at 1045.

## 2015-02-05 NOTE — Telephone Encounter (Signed)
See note below and please advise, Thanks! 

## 2015-02-05 NOTE — Telephone Encounter (Signed)
Patients daughter [name] called stating that Per Dr. Deatra Ina Mr. Altergott would need to be evaluated  Per [name] the patient has been weak and decreased appetite   Please advise  Thank you

## 2015-02-05 NOTE — Telephone Encounter (Signed)
Options ov or ER--whichever family says is appropriate.

## 2015-02-05 NOTE — Telephone Encounter (Signed)
You have been scheduled for an Renal  ultrasound at [facility] (1st floor of hospital) on 7/14 at 3:30. Please arrive 15 minutes prior to your appointment for registration. . Should you need to reschedule your appointment, please contact radiology at [phone]. This test typically takes about 30 minutes to perform. Drink 32 ounces of clear fluid and do not void an hour before the test.

## 2015-02-05 NOTE — Telephone Encounter (Signed)
I contacted the pt's daughter and left a voicemail. I advised her Dr. Loanne Drilling would be willing to see Mr. Advincula tomorrow or if they felt like he should be evaluated sooner they could seek care at the ER. I requested a call back from the pt's daughter to let us know how she will proceed.

## 2015-02-06 ENCOUNTER — Other Ambulatory Visit (INDEPENDENT_AMBULATORY_CARE_PROVIDER_SITE_OTHER): Payer: Medicare Other

## 2015-02-06 ENCOUNTER — Ambulatory Visit (INDEPENDENT_AMBULATORY_CARE_PROVIDER_SITE_OTHER): Payer: Medicare Other | Admitting: Endocrinology

## 2015-02-06 ENCOUNTER — Encounter: Payer: Self-pay | Admitting: Endocrinology

## 2015-02-06 ENCOUNTER — Ambulatory Visit (HOSPITAL_COMMUNITY): Payer: Medicare Other

## 2015-02-06 VITALS — BP 128/78 | HR 62 | Temp 97.2°F | Resp 15 | Ht 69.0 in | Wt 157.0 lb

## 2015-02-06 DIAGNOSIS — E0590 Thyrotoxicosis, unspecified without thyrotoxic crisis or storm: Secondary | ICD-10-CM | POA: Diagnosis not present

## 2015-02-06 DIAGNOSIS — I25118 Atherosclerotic heart disease of native coronary artery with other forms of angina pectoris: Secondary | ICD-10-CM | POA: Diagnosis not present

## 2015-02-06 DIAGNOSIS — D509 Iron deficiency anemia, unspecified: Secondary | ICD-10-CM

## 2015-02-06 LAB — CBC WITH DIFFERENTIAL/PLATELET
Basophils Absolute: 0 10*3/uL (ref 0.0–0.1)
Basophils Relative: 0.4 % (ref 0.0–3.0)
EOS ABS: 0.4 10*3/uL (ref 0.0–0.7)
Eosinophils Relative: 5.4 % — ABNORMAL HIGH (ref 0.0–5.0)
HEMATOCRIT: 35.8 % — AB (ref 39.0–52.0)
Hemoglobin: 12.4 g/dL — ABNORMAL LOW (ref 13.0–17.0)
Lymphocytes Relative: 23.2 % (ref 12.0–46.0)
Lymphs Abs: 1.6 10*3/uL (ref 0.7–4.0)
MCHC: 34.6 g/dL (ref 30.0–36.0)
MCV: 94.6 fl (ref 78.0–100.0)
Monocytes Absolute: 0.5 10*3/uL (ref 0.1–1.0)
Monocytes Relative: 7.1 % (ref 3.0–12.0)
NEUTROS ABS: 4.5 10*3/uL (ref 1.4–7.7)
Neutrophils Relative %: 63.9 % (ref 43.0–77.0)
Platelets: 193 10*3/uL (ref 150.0–400.0)
RBC: 3.78 Mil/uL — ABNORMAL LOW (ref 4.22–5.81)
RDW: 12.5 % (ref 11.5–15.5)
WBC: 7 10*3/uL (ref 4.0–10.5)

## 2015-02-06 LAB — TSH: TSH: 3.96 u[IU]/mL (ref 0.35–4.50)

## 2015-02-06 LAB — T4, FREE: Free T4: 0.89 ng/dL (ref 0.60–1.60)

## 2015-02-06 NOTE — Progress Notes (Signed)
Subjective:    Patient ID: Kurt Elliott, male    DOB: 1921/04/13, 81 y.o.   MRN: [id number]  HPI Pt's wife says pt has few mos of slightly decreased strength throughout the body, and assoc severe fatigue.  He has decreased appetite.   Past Medical History  Diagnosis Date  . Hypertension   . Hyperlipemia   . Unspecified disorder of liver   . Cellulitis, leg     left  . Pulmonary nodule, right     2008 / unchanged CT scan, July, 2009  . Abdominal pain, left lower quadrant   . Adenocarcinoma of prostate   . Esophageal stricture   . Spinal stenosis   . Hyperglycemia   . Hyperthyroidism   . GERD (gastroesophageal reflux disease)   . Hx of colonic polyps   . Renal artery stenosis     Mild  . Atheroma     no penetrating ulcer, Abdominal aorta  . Inferior mesenteric artery injury     High-grade stenosis, CT scan, 2006  . Vertigo   . Diverticulum   . Incisional hernia   . Rash     Ticlid  . Cough     ACE Inhibitor, tolerates ARB  . Carotid artery disease     Doppler, 2006, minimal disease  //  Doppler, May, 2012, mild increase velocities, 40-59% bilateral stenoses  . Renal cyst     Hyperdense cyst lower pole left kidney  . Dizziness   . Unsteady gait   . Sinus bradycardia   . CAD (coronary artery disease), native coronary artery     a. cath 2008  b. cath 10/21/2014 3v dzs w/ patent RCA stent, severe stenosis of mid LCx/OM1 s/p DES, moderately severe diffuse mid LAD treated with DES, patent prox LAD stent  . Mild aortic stenosis     a. 09/2014 Echo: EF 65-70%, Gr 1 DD, no rwma, mild AS.    Past Surgical History  Procedure Laterality Date  . Esophagogastroduodenoscopy    . Cardiac catheterization    . Incisional hernia repair    . Left heart catheterization with coronary angiogram N/A 10/21/2014    Procedure: LEFT HEART CATHETERIZATION WITH CORONARY ANGIOGRAM;  Surgeon: Sherren Mocha, MD;  Location: [facility] CATH LAB;  Service: Cardiovascular;  Laterality: N/A;  .  Percutaneous coronary stent intervention (pci-s)  10/21/2014    Procedure: PERCUTANEOUS CORONARY STENT INTERVENTION (PCI-S);  Surgeon: Sherren Mocha, MD;  Location: [facility] CATH LAB;  Service: Cardiovascular;;  Mid LAD and OM1    History   Social History  . Marital Status: Married    Spouse Name: N/A  . Number of Children: 4  . Years of Education: N/A   Occupational History  . 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 Smoking status: Former Research scientist (life sciences)  . Smokeless tobacco: Not on file  . Alcohol Use: No  . Drug Use: No  . Sexual Activity: Not on file   Other Topics Concern  . Not on file   Social History Narrative    Current Outpatient Prescriptions on File Prior to Visit  Medication Sig Dispense Refill  . amLODipine (NORVASC) 5 MG tablet Take 1 tablet (5 mg total) by mouth daily. 90 tablet 3  . aspirin EC 81 MG tablet Take 81 mg by mouth daily.    . clopidogrel (PLAVIX) 75 MG tablet Take 1 tablet (75 mg total) by mouth daily with breakfast. 90 tablet 3  . Leuprolide Acetate (LUPRON DEPOT IM) Inject into  the muscle. Done at [place] office approx once every 3 months    . methimazole (TAPAZOLE) 5 MG tablet TAKE 1 TABLET BY MOUTH 3 TIMES A WEEK 40 tablet 1  . metoprolol tartrate (LOPRESSOR) 25 MG tablet TAKE 1 TABLET (25 MG TOTAL) BY MOUTH 2 (TWO) TIMES DAILY. 180 tablet 3  . MICRONIZED COLESTIPOL HCL 1 G tablet TAKE 5 TABLETS BY MOUTH EVERY DAY AS DIRECTED 450 tablet 3  . Misc. Devices (QUAD CANE) MISC 1 Device by Does not apply route once. 1 each 0  . nitroGLYCERIN (NITROSTAT) 0.4 MG SL tablet Place 1 tablet (0.4 mg total) under the tongue every 5 (five) minutes as needed. 25 tablet 3  . olmesartan (BENICAR) 5 MG tablet Take 2 tablets (10 mg total) by mouth daily. 180 tablet 3  . pantoprazole (PROTONIX) 40 MG tablet TAKE 1 TABLET (40 MG TOTAL) BY MOUTH DAILY. 90 tablet 3  . triamcinolone cream (KENALOG) 0.1 % Apply 1 application topically daily.   3  . zaleplon  (SONATA) 5 MG capsule Take 1 capsule (5 mg total) by mouth at bedtime as needed for sleep. 30 capsule 5   No current facility-administered medications on file prior to visit.    Allergies  Allergen Reactions  . Isosorbide Other (See Comments)    "severe Headache" per patient  . Lisinopril Cough  . Ticlopidine Hcl Rash    Family History  Problem Relation Age of Onset  . Breast cancer Sister     2 sisters  . Pemphigus vulgaris Mother     died at 51    BP 128/78 mmHg  Pulse 62  Temp(Src) 97.2 F (36.2 C) (Oral)  Resp 15  Ht 5\' 9"  (1.753 m)  Wt 157 lb (71.215 kg)  BMI 23.17 kg/m2  SpO2 98%  Review of Systems  Constitutional: Negative for fever.  Respiratory: Negative for shortness of breath.   Cardiovascular: Negative for chest pain.  Gastrointestinal: Negative for anal bleeding.  Endocrine: Positive for cold intolerance.  Genitourinary: Negative for hematuria.  Skin: Negative for wound.  Neurological: Negative for syncope.  Psychiatric/Behavioral: Negative for dysphoric mood.   He has lost just a few lbs.  Denies falls.      Objective:   Physical Exam Vital signs: see vs page.   Gen: elderly, frail, no distress.  LUNGS:  Clear to auscultation HEART:  Regular rate and rhythm without murmurs noted. Normal S1,S2.    Gait: slow but steady.    Lab Results  Component Value Date   TSH 3.96 02/06/2015      Assessment & Plan:  Hyperthyroidism: well-controlled.   Fatigue: chronic but worse.    Patient is advised the following: Patient Instructions  blood tests are requested for you today.  We'll let you know about the results.  If this is normal, i'll ask your heart doctor if we can reduce the metoprolol.    addendum: Please continue the same methimazole.

## 2015-02-06 NOTE — Patient Instructions (Addendum)
blood tests are requested for you today.  We'll let you know about the results.  If this is normal, i'll ask your heart doctor if we can reduce the metoprolol.

## 2015-02-10 ENCOUNTER — Telehealth: Payer: Self-pay | Admitting: Endocrinology

## 2015-02-10 NOTE — Telephone Encounter (Signed)
Left voicemail advising of note below. Requested call back if the pt would like to discuss.  

## 2015-02-10 NOTE — Telephone Encounter (Signed)
please call patient: i heard back from Dr Ron Parker: Please stop taking the metoprolol.  i hope that will help you feel better.

## 2015-02-13 ENCOUNTER — Encounter: Payer: Self-pay | Admitting: Cardiology

## 2015-02-13 ENCOUNTER — Ambulatory Visit: Payer: Medicare Other | Admitting: Physician Assistant

## 2015-02-13 NOTE — Progress Notes (Signed)
I have spoken with the patient's son-in-law, Dr. Erskine Emery of our GI group. The family is away on vacation. The patient has been having intermittent spells of marked fatigue. These come on suddenly and his overall color does not look good. He has not had syncope or presyncope. He is not having chest pain.  Recently, we had decided to see if he would feel a little better by lowering his metoprolol dose. It was reduced from 25 twice a day to 12.5 twice a day for a few days and then stopped.  Today the patient continues to have intermittent feelings of fatigue. His heart rate is higher than usual at 105. His blood pressure is in the range of 130/70. We have decided to place him back on 12.5 mg of Lopressor twice a day for his slight increase in heart rate. His amlodipine will be stopped. It is possible that he needs a higher perfusion pressure. If he develops marked hypertension, his son-in-law will restart his amlodipine at a lower dose of 2.5 mg.   The patient has been given an appointment to be seen in the office on Monday, July 25. I will be available by telephone. We will try to see if there are any obvious cardiac issues causing his symptoms.  Daryel November, MD

## 2015-02-17 ENCOUNTER — Encounter: Payer: Self-pay | Admitting: Cardiology

## 2015-02-17 ENCOUNTER — Inpatient Hospital Stay (HOSPITAL_COMMUNITY)
Admission: AD | Admit: 2015-02-17 | Discharge: 2015-02-19 | DRG: 287 | Disposition: A | Discharge status: Home Health | Payer: Medicare Other | Source: Ambulatory Visit | Attending: Cardiology | Admitting: Cardiology

## 2015-02-17 ENCOUNTER — Ambulatory Visit (INDEPENDENT_AMBULATORY_CARE_PROVIDER_SITE_OTHER): Payer: Medicare Other | Admitting: Cardiology

## 2015-02-17 ENCOUNTER — Encounter (HOSPITAL_COMMUNITY): Payer: Self-pay | Admitting: General Practice

## 2015-02-17 VITALS — BP 140/50 | HR 84 | Ht 69.0 in | Wt 154.0 lb

## 2015-02-17 DIAGNOSIS — Z8249 Family history of ischemic heart disease and other diseases of the circulatory system: Secondary | ICD-10-CM

## 2015-02-17 DIAGNOSIS — I129 Hypertensive chronic kidney disease with stage 1 through stage 4 chronic kidney disease, or unspecified chronic kidney disease: Secondary | ICD-10-CM | POA: Diagnosis present

## 2015-02-17 DIAGNOSIS — R0602 Shortness of breath: Secondary | ICD-10-CM | POA: Diagnosis present

## 2015-02-17 DIAGNOSIS — D649 Anemia, unspecified: Secondary | ICD-10-CM | POA: Diagnosis present

## 2015-02-17 DIAGNOSIS — K219 Gastro-esophageal reflux disease without esophagitis: Secondary | ICD-10-CM | POA: Diagnosis present

## 2015-02-17 DIAGNOSIS — I350 Nonrheumatic aortic (valve) stenosis: Secondary | ICD-10-CM

## 2015-02-17 DIAGNOSIS — N183 Chronic kidney disease, stage 3 unspecified: Secondary | ICD-10-CM

## 2015-02-17 DIAGNOSIS — I208 Other forms of angina pectoris: Secondary | ICD-10-CM

## 2015-02-17 DIAGNOSIS — Z87891 Personal history of nicotine dependence: Secondary | ICD-10-CM

## 2015-02-17 DIAGNOSIS — G4700 Insomnia, unspecified: Secondary | ICD-10-CM

## 2015-02-17 DIAGNOSIS — I25118 Atherosclerotic heart disease of native coronary artery with other forms of angina pectoris: Secondary | ICD-10-CM

## 2015-02-17 DIAGNOSIS — I10 Essential (primary) hypertension: Secondary | ICD-10-CM | POA: Diagnosis present

## 2015-02-17 DIAGNOSIS — Z955 Presence of coronary angioplasty implant and graft: Secondary | ICD-10-CM

## 2015-02-17 DIAGNOSIS — I340 Nonrheumatic mitral (valve) insufficiency: Secondary | ICD-10-CM

## 2015-02-17 DIAGNOSIS — Z79899 Other long term (current) drug therapy: Secondary | ICD-10-CM

## 2015-02-17 DIAGNOSIS — E0590 Thyrotoxicosis, unspecified without thyrotoxic crisis or storm: Secondary | ICD-10-CM | POA: Diagnosis present

## 2015-02-17 DIAGNOSIS — R079 Chest pain, unspecified: Secondary | ICD-10-CM

## 2015-02-17 DIAGNOSIS — R5383 Other fatigue: Secondary | ICD-10-CM | POA: Diagnosis present

## 2015-02-17 DIAGNOSIS — Z8546 Personal history of malignant neoplasm of prostate: Secondary | ICD-10-CM

## 2015-02-17 DIAGNOSIS — R000 Tachycardia, unspecified: Secondary | ICD-10-CM | POA: Clinically undetermined

## 2015-02-17 DIAGNOSIS — I25708 Atherosclerosis of coronary artery bypass graft(s), unspecified, with other forms of angina pectoris: Secondary | ICD-10-CM | POA: Diagnosis not present

## 2015-02-17 DIAGNOSIS — R5381 Other malaise: Secondary | ICD-10-CM

## 2015-02-17 DIAGNOSIS — Z7902 Long term (current) use of antithrombotics/antiplatelets: Secondary | ICD-10-CM

## 2015-02-17 DIAGNOSIS — E639 Nutritional deficiency, unspecified: Secondary | ICD-10-CM

## 2015-02-17 DIAGNOSIS — K5900 Constipation, unspecified: Secondary | ICD-10-CM | POA: Diagnosis present

## 2015-02-17 DIAGNOSIS — R0609 Other forms of dyspnea: Secondary | ICD-10-CM

## 2015-02-17 DIAGNOSIS — I25110 Atherosclerotic heart disease of native coronary artery with unstable angina pectoris: Principal | ICD-10-CM | POA: Diagnosis present

## 2015-02-17 DIAGNOSIS — I080 Rheumatic disorders of both mitral and aortic valves: Secondary | ICD-10-CM | POA: Diagnosis present

## 2015-02-17 DIAGNOSIS — I2510 Atherosclerotic heart disease of native coronary artery without angina pectoris: Secondary | ICD-10-CM | POA: Diagnosis present

## 2015-02-17 DIAGNOSIS — Z7982 Long term (current) use of aspirin: Secondary | ICD-10-CM

## 2015-02-17 DIAGNOSIS — E785 Hyperlipidemia, unspecified: Secondary | ICD-10-CM | POA: Diagnosis present

## 2015-02-17 DIAGNOSIS — Z823 Family history of stroke: Secondary | ICD-10-CM

## 2015-02-17 LAB — BASIC METABOLIC PANEL
ANION GAP: 9 (ref 5–15)
BUN: 36 mg/dL — ABNORMAL HIGH (ref 6–20)
CO2: 22 mmol/L (ref 22–32)
CREATININE: 1.49 mg/dL — AB (ref 0.61–1.24)
Calcium: 9.8 mg/dL (ref 8.9–10.3)
Chloride: 105 mmol/L (ref 101–111)
GFR calc Af Amer: 45 mL/min — ABNORMAL LOW (ref >60)
GFR calc non Af Amer: 39 mL/min — ABNORMAL LOW (ref >60)
Glucose, Bld: 108 mg/dL — ABNORMAL HIGH (ref 65–99)
POTASSIUM: 4.7 mmol/L (ref 3.5–5.1)
Sodium: 136 mmol/L (ref 135–145)

## 2015-02-17 LAB — CBC
HCT: 34.1 % — ABNORMAL LOW (ref 39.0–52.0)
HEMOGLOBIN: 12.3 g/dL — AB (ref 13.0–17.0)
MCH: 32.7 pg (ref 26.0–34.0)
MCHC: 36.1 g/dL — ABNORMAL HIGH (ref 30.0–36.0)
MCV: 90.7 fL (ref 78.0–100.0)
Platelets: 181 10*3/uL (ref 150–400)
RBC: 3.76 MIL/uL — ABNORMAL LOW (ref 4.22–5.81)
RDW: 12.3 % (ref 11.5–15.5)
WBC: 7.5 10*3/uL (ref 4.0–10.5)

## 2015-02-17 LAB — TROPONIN I: Troponin I: <0.03 ng/mL (ref <0.031)

## 2015-02-17 MED ORDER — SODIUM CHLORIDE 0.9 % IV SOLN: 250.0000 mL | INTRAVENOUS | Status: DC | PRN

## 2015-02-17 MED ORDER — METOPROLOL TARTRATE 12.5 MG HALF TABLET
12.5000 mg | ORAL_TABLET | Freq: Two times a day (BID) | ORAL | Status: DC
  Administered 2015-02-17 – 2015-02-19 (×4): 12.5 mg via ORAL
  Filled 2015-02-17 (×4): qty 1

## 2015-02-17 MED ORDER — SODIUM CHLORIDE 0.9 % WEIGHT BASED INFUSION
1.0000 mL/kg/h | INTRAVENOUS | Status: DC
  Administered 2015-02-17: 1 mL/kg/h via INTRAVENOUS

## 2015-02-17 MED ORDER — ASPIRIN 300 MG RE SUPP: 300.0000 mg | RECTAL | Status: AC

## 2015-02-17 MED ORDER — ONDANSETRON HCL 4 MG/2ML IJ SOLN: 4.0000 mg | Freq: Four times a day (QID) | INTRAMUSCULAR | Status: DC | PRN

## 2015-02-17 MED ORDER — PANTOPRAZOLE SODIUM 40 MG PO TBEC
40.0000 mg | DELAYED_RELEASE_TABLET | Freq: Every day | ORAL | Status: DC
  Administered 2015-02-17 – 2015-02-19 (×3): 40 mg via ORAL
  Filled 2015-02-17 (×3): qty 1

## 2015-02-17 MED ORDER — CLOPIDOGREL BISULFATE 75 MG PO TABS
75.0000 mg | ORAL_TABLET | Freq: Every day | ORAL | Status: DC
  Administered 2015-02-18 – 2015-02-19 (×2): 75 mg via ORAL
  Filled 2015-02-17 (×2): qty 1

## 2015-02-17 MED ORDER — HEPARIN (PORCINE) IN NACL 100-0.45 UNIT/ML-% IJ SOLN
800.0000 [IU]/h | INTRAMUSCULAR | Status: DC
  Administered 2015-02-17: 800 [IU]/h via INTRAVENOUS
  Filled 2015-02-17: qty 250

## 2015-02-17 MED ORDER — ACETAMINOPHEN 325 MG PO TABS: 650.0000 mg | ORAL_TABLET | ORAL | Status: DC | PRN

## 2015-02-17 MED ORDER — SODIUM CHLORIDE 0.9 % IV SOLN
INTRAVENOUS | Status: DC
  Administered 2015-02-17 – 2015-02-19 (×3): via INTRAVENOUS

## 2015-02-17 MED ORDER — ASPIRIN EC 81 MG PO TBEC
81.0000 mg | DELAYED_RELEASE_TABLET | Freq: Every day | ORAL | Status: DC
  Administered 2015-02-19: 81 mg via ORAL
  Filled 2015-02-17: qty 1

## 2015-02-17 MED ORDER — POLYETHYLENE GLYCOL 3350 17 G PO PACK
17.0000 g | PACK | Freq: Every day | ORAL | Status: DC
  Administered 2015-02-18 – 2015-02-19 (×2): 17 g via ORAL
  Filled 2015-02-17 (×3): qty 1

## 2015-02-17 MED ORDER — DIPHENHYDRAMINE HCL 25 MG PO CAPS: 50.0000 mg | ORAL_CAPSULE | Freq: Once | ORAL | Status: DC

## 2015-02-17 MED ORDER — ATORVASTATIN CALCIUM 20 MG PO TABS
20.0000 mg | ORAL_TABLET | Freq: Every day | ORAL | Status: DC
  Administered 2015-02-17 – 2015-02-18 (×2): 20 mg via ORAL
  Filled 2015-02-17 (×2): qty 1

## 2015-02-17 MED ORDER — SODIUM CHLORIDE 0.9 % IJ SOLN
3.0000 mL | Freq: Two times a day (BID) | INTRAMUSCULAR | Status: DC
  Administered 2015-02-17: 3 mL via INTRAVENOUS

## 2015-02-17 MED ORDER — ASPIRIN 81 MG PO CHEW
81.0000 mg | CHEWABLE_TABLET | ORAL | Status: AC
  Administered 2015-02-18: 81 mg via ORAL
  Filled 2015-02-17: qty 1

## 2015-02-17 MED ORDER — ASPIRIN 81 MG PO CHEW: 324.0000 mg | CHEWABLE_TABLET | ORAL | Status: AC

## 2015-02-17 MED ORDER — HEPARIN BOLUS VIA INFUSION
4000.0000 [IU] | Freq: Once | INTRAVENOUS | Status: AC
  Administered 2015-02-17: 4000 [IU] via INTRAVENOUS
  Filled 2015-02-17: qty 4000

## 2015-02-17 MED ORDER — SODIUM CHLORIDE 0.9 % IJ SOLN: 3.0000 mL | INTRAMUSCULAR | Status: DC | PRN

## 2015-02-17 MED ORDER — TRAZODONE HCL 50 MG PO TABS
50.0000 mg | ORAL_TABLET | Freq: Every evening | ORAL | Status: AC | PRN
Start: 2015-02-17 — End: 2015-02-17
  Administered 2015-02-17: 50 mg via ORAL
  Filled 2015-02-17: qty 1

## 2015-02-17 MED ORDER — ASPIRIN EC 81 MG PO TBEC: 81.0000 mg | DELAYED_RELEASE_TABLET | Freq: Every day | ORAL | Status: DC

## 2015-02-17 MED ORDER — NITROGLYCERIN 2 % TD OINT
0.5000 [in_us] | TOPICAL_OINTMENT | Freq: Four times a day (QID) | TRANSDERMAL | Status: DC
  Administered 2015-02-17 – 2015-02-18 (×3): 0.5 [in_us] via TOPICAL
  Filled 2015-02-17: qty 30

## 2015-02-17 NOTE — H&P (Signed)
PCP: Renato Shin, MD Cardiologist: Dr. Ron Parker   Chief Complaint  Patient presents with  . Coronary Artery Disease    chest pain     History of Present Illness: Kurt Elliott is a 81 y.o. male who presents for follow up and now episodes of extreme fatigue   81 year old male with the above complex problem list. He was seen by Dr. Ron Parker in clinic on March 23 with complaints of increasing dyspnea on exertion. Decision was made to pursue diagnostic catheterization. This was performed on March 28 and revealed severe left circumflex and mid LAD disease. Previously placed stents in the proximal LAD and right coronary artery were patent. EF was 65-70%. Following catheterization, both the LAD and left circumflex were successfully treated with Promus drug eluting stents. Patient tolerated procedure well and was discharged the next day.   He had been doing well until last week he would have significant fatigue and his lopressor and amlodipine have been held. Last week he also had nausea and vomiting and found to be impacted. That has resolved after laxatives and work by Dr. Deatra Ina his son in law. He has increasing DOE. None at rest but with exertion. Still sleeps on his normal 2 pillows and no waking SOB.   Today while sitting he developed epigastric chest pressure. Felt like someone sitting there. Did not think to take NTG and it resolved after several min. None since this AM.   Family concerned about increasing symptoms and continued significant fatigue. I discussed with Dr. Ron Parker- his primary cardiology MD. Plan will be to admit and have cardiac cath in AM.    Past Medical History  Diagnosis Date      . Hypertension   . Hyperlipemia   . Unspecified disorder of liver   . Cellulitis, leg     left  . Pulmonary nodule, right     2008 / unchanged CT scan, July, 2009  . Abdominal pain, left lower quadrant   . Adenocarcinoma of  prostate   . Esophageal stricture   . Spinal stenosis   . Hyperglycemia   . Hyperthyroidism   . GERD (gastroesophageal reflux disease)   . Hx of colonic polyps   . Renal artery stenosis     Mild  . Atheroma     no penetrating ulcer, Abdominal aorta  . Inferior mesenteric artery injury     High-grade stenosis, CT scan, 2006  . Vertigo   . Diverticulum   . Incisional hernia   . Rash     Ticlid  . Cough     ACE Inhibitor, tolerates ARB  . Carotid artery disease     Doppler, 2006, minimal disease // Doppler, May, 2012, mild increase velocities, 40-59% bilateral stenoses  . Renal cyst     Hyperdense cyst lower pole left kidney  . Dizziness   . Unsteady gait   . Sinus bradycardia   . CAD (coronary artery disease), native coronary artery     a. cath 2008 b. cath 10/21/2014 3v dzs w/ patent RCA stent, severe stenosis of mid LCx/OM1 s/p DES, moderately severe diffuse mid LAD treated with DES, patent prox LAD stent  . Mild aortic stenosis     a. 09/2014 Echo: EF 65-70%, Gr 1 DD, no rwma, mild AS.    Past Surgical History  Procedure Laterality Date  . Esophagogastroduodenoscopy    . Cardiac catheterization    . Incisional hernia repair    . Left heart catheterization with coronary angiogram N/A 10/21/2014  Procedure: LEFT HEART CATHETERIZATION WITH CORONARY ANGIOGRAM; Surgeon: Sherren Mocha, MD; Location: [facility] C[facility] LAB; Service: Cardiovascular; Laterality: N/A;  . Percutaneous coronary stent intervention (pci-s)  10/21/2014    Procedure: PERCUTANEOUS CORONARY STENT INTERVENTION (PCI-S); Surgeon: Sherren Mocha, MD; Location: [facility] CATH LAB; Service: Cardiovascular;; Mid LAD and OM1     Current Outpatient Prescriptions  Medication Sig Dispense Refill  . aspirin EC 81 MG tablet Take 81 mg by mouth daily.    . clopidogrel (PLAVIX) 75 MG  tablet Take 1 tablet (75 mg total) by mouth daily with breakfast. 90 tablet 3  . Leuprolide Acetate (LUPRON DEPOT IM) Inject into the muscle. Done at [place] office approx once every 3 months    . methimazole (TAPAZOLE) 5 MG tablet TAKE 1 TABLET BY MOUTH 3 TIMES A WEEK 40 tablet 1  . MICRONIZED COLESTIPOL HCL 1 G tablet TAKE 5 TABLETS BY MOUTH EVERY DAY AS DIRECTED 450 tablet 3  . Misc. Devices (QUAD CANE) MISC 1 Device by Does not apply route once. 1 each 0  . nitroGLYCERIN (NITROSTAT) 0.4 MG SL tablet Place 1 tablet (0.4 mg total) under the tongue every 5 (five) minutes as needed. (Patient taking differently: Place 0.4 mg under the tongue every 5 (five) minutes as needed for chest pain. ) 25 tablet 3  . olmesartan (BENICAR) 5 MG tablet Take 2 tablets (10 mg total) by mouth daily. 180 tablet 3  . pantoprazole (PROTONIX) 40 MG tablet TAKE 1 TABLET (40 MG TOTAL) BY MOUTH DAILY. 90 tablet 3  . triamcinolone cream (KENALOG) 0.1 % Apply 1 application topically daily.   3  . zaleplon (SONATA) 5 MG capsule Take 1 capsule (5 mg total) by mouth at bedtime as needed for sleep. 30 capsule 5  . amLODipine (NORVASC) 5 MG tablet Take 1 tablet (5 mg total) by mouth daily. (Patient not taking: Reported on 02/17/2015) 90 tablet 3  . metoprolol tartrate (LOPRESSOR) 25 MG tablet Take 25 mg by mouth daily.     No current facility-administered medications for this visit.    Allergies: Isosorbide; Lisinopril; and Ticlopidine hcl    Social History: The patient  reports that he has quit smoking. He does not have any smokeless tobacco history on file. He reports that he does not drink alcohol or use illicit drugs.   Family History: The patient's family history includes Breast cancer in his sister; Heart attack in his father; Hypertension in his brother, father, mother, and sister; Pemphigus vulgaris in his mother; Stroke in his father.    ROS:  General:no colds or fevers, some weight loss since April Skin:no rashes or ulcers, + bruises on extremities  HEENT:no blurred vision, no congestion CV:see HPI PUL:see HPI GI:no diarrhea +++constipation with clearing of impaction on th 22 nd of July. or melena, no indigestion GU:no hematuria, no dysuria MS:no joint pain, no claudication Neuro:no syncope, no lightheadedness Endo:no diabetes, no thyroid disease  Wt Readings from Last 3 Encounters:  02/17/15 154 lb (69.854 kg)  02/06/15 157 lb (71.215 kg)  11/12/14 164 lb (74.39 kg)     PHYSICAL EXAM: VS: BP 140/50 mmHg  Pulse 84  Ht 5\' 9"  (1.753 m)  Wt 154 lb (69.854 kg)  BMI 22.73 kg/m2 , BMI Body mass index is 22.73 kg/(m^2). General:Pleasant affect, NAD Skin:Warm and dry, brisk capillary refill HEENT:normocephalic, sclera clear, mucus membranes moist- somewhat hard of hearing.  Neck:supple, mild JVD sitting up right, no bruits  Heart:S1S2 RRR with 2/6 systolic murmur, no gallup, rub or  click Lungs:clear to diminished without rales, rhonchi, or wheezes GBT:DVVO, non tender, + BS, do not palpate liver spleen or masses Ext:no lower ext edema, 1+ pedal pulses, 2+ radial pulses Neuro:alert and oriented X 3, MAE, follows commands, + facial symmetry    EKG: EKG is ordered today. The ekg ordered today demonstrates SR non specific lat T wave mild depression. Otherwise no acute changes. Dr. Rayann Heman reviewed.    Recent Labs: 02/05/2015: ALT 26; BUN 34*; Creatinine, Ser 1.35; Potassium 4.9; Sodium 139 02/06/2015: Hemoglobin 12.4*; Platelets 193.0; TSH 3.96    Lipid Panel  Labs (Brief)       Component Value Date/Time   CHOL 161 12/28/2013 1204   TRIG 258.0* 12/28/2013 1204   TRIG 137 05/23/2006 0817   HDL 37.50* 12/28/2013 1204   CHOLHDL 4 12/28/2013 1204   CHOLHDL 3.6 CALC 05/23/2006 0817   VLDL 51.6* 12/28/2013 1204   LDLCALC 72 12/28/2013 1204   LDLDIRECT 97.5  06/01/2011 1419        Other studies Reviewed: Additional studies/ records that were reviewed today include: cardiac cath from April..   ASSESSMENT AND PLAN:  1. Unstable angina with chest pain today and increasing DOE with no acute CHF. Increasing fatigue as well. Stents to RCA and LCX in April this year. Discussed with Dr. Ron Parker and plan for admit to tele with cardiac cath in AM. Dr. Ron Parker talked with pt's son a urologist in [place] and this plan was agreed upon. Will add IV heparin and NTG paste. Will check serial troponins.   2. CAD- Status post April 2016 catheterization revealing severe LAD and circumflex disease with patent proximal LAD and RCA stents. New disease in the LAD and circumflex were successfully treated using Promus drug stents. Now with issues as above.   3. Hypertension:now off amlodipine and lopressor. Will add low dose BB for now until results of cath are back.   3. Hyperlipidemia: He is on colestipol. Last LDL in June 2015 was 72.        Dillard Cannon, MD  He has been having episodes of marked fatigue. More recently he has had SOB. Today he had exertional chest pain. Admit now for IV heparin and then cath.

## 2015-02-17 NOTE — Progress Notes (Signed)
ANTICOAGULATION CONSULT NOTE - Initial Consult  Pharmacy Consult for Heparin Indication: chest pain/ACS  Allergies  Allergen Reactions  . Isosorbide Other (See Comments)    "severe Headache" per patient  . Lisinopril Cough  . Ticlopidine Hcl Rash    Patient Measurements: Height: 5\' 10"  (177.8 cm) Weight: 150 lb 14.4 oz (68.448 kg) IBW/kg (Calculated) : 73 Heparin Dosing Weight: 68 kg  Vital Signs: Temp: 97.6 F (36.4 C) (07/25 1545) Temp Source: Oral (07/25 1545) BP: 151/54 mmHg (07/25 1545) Pulse Rate: 84 (07/25 1545)  Labs: No results for input(s): HGB, HCT, PLT, APTT, LABPROT, INR, HEPARINUNFRC, CREATININE, CKTOTAL, CKMB, TROPONINI in the last 72 hours.  Estimated Creatinine Clearance: 33.8 mL/min (by C-G formula based on Cr of 1.35).   Medical History: Past Medical History  Diagnosis Date  . Hypertension   . Hyperlipemia   . Unspecified disorder of liver   . Cellulitis, leg     left  . Pulmonary nodule, right     2008 / unchanged CT scan, July, 2009  . Abdominal pain, left lower quadrant   . Adenocarcinoma of prostate   . Esophageal stricture   . Spinal stenosis   . Hyperglycemia   . Hyperthyroidism   . GERD (gastroesophageal reflux disease)   . Hx of colonic polyps   . Renal artery stenosis     Mild  . Atheroma     no penetrating ulcer, Abdominal aorta  . Inferior mesenteric artery injury     High-grade stenosis, CT scan, 2006  . Vertigo   . Diverticulum   . Incisional hernia   . Rash     Ticlid  . Cough     ACE Inhibitor, tolerates ARB  . Carotid artery disease     Doppler, 2006, minimal disease  //  Doppler, May, 2012, mild increase velocities, 40-59% bilateral stenoses  . Renal cyst     Hyperdense cyst lower pole left kidney  . Dizziness   . Unsteady gait   . Sinus bradycardia   . CAD (coronary artery disease), native coronary artery     a. cath 2008  b. cath 10/21/2014 3v dzs w/ patent RCA stent, severe stenosis of mid LCx/OM1 s/p DES,  moderately severe diffuse mid LAD treated with DES, patent prox LAD stent  . Mild aortic stenosis     a. 09/2014 Echo: EF 65-70%, Gr 1 DD, no rwma, mild AS.  Marland Kitchen Shortness of breath dyspnea     Medications:  Prescriptions prior to admission  Medication Sig Dispense Refill Last Dose  . amLODipine (NORVASC) 5 MG tablet Take 1 tablet (5 mg total) by mouth daily. (Patient not taking: Reported on 02/17/2015) 90 tablet 3 Not Taking  . aspirin EC 81 MG tablet Take 81 mg by mouth daily.   Taking  . clopidogrel (PLAVIX) 75 MG tablet Take 1 tablet (75 mg total) by mouth daily with breakfast. 90 tablet 3 Taking  . Leuprolide Acetate (LUPRON DEPOT IM) Inject into the muscle as directed. Done at [place] office approx once every 3 months   Taking  . methimazole (TAPAZOLE) 5 MG tablet TAKE 1 TABLET BY MOUTH 3 TIMES A WEEK 40 tablet 1 Taking  . metoprolol tartrate (LOPRESSOR) 25 MG tablet Take 25 mg by mouth daily.   Not Taking  . MICRONIZED COLESTIPOL HCL 1 G tablet TAKE 5 TABLETS BY MOUTH EVERY DAY AS DIRECTED 450 tablet 3 Taking  . Misc. Devices (QUAD CANE) MISC 1 Device by Does not apply route once.  1 each 0 Taking  . nitroGLYCERIN (NITROSTAT) 0.4 MG SL tablet Place 0.4 mg under the tongue every 5 (five) minutes as needed for chest pain.   Taking  . olmesartan (BENICAR) 5 MG tablet Take 2 tablets (10 mg total) by mouth daily. 180 tablet 3 Taking  . pantoprazole (PROTONIX) 40 MG tablet TAKE 1 TABLET (40 MG TOTAL) BY MOUTH DAILY. 90 tablet 3 Taking  . triamcinolone cream (KENALOG) 0.1 % Apply 1 application topically daily.   3 Taking  . zaleplon (SONATA) 5 MG capsule Take 1 capsule (5 mg total) by mouth at bedtime as needed for sleep. 30 capsule 5 Taking   Scheduled:  . aspirin  324 mg Oral NOW   Or  . aspirin  300 mg Rectal NOW  . [START ON 02/18/2015] aspirin  81 mg Oral Pre-Cath  . [START ON 02/18/2015] aspirin EC  81 mg Oral Daily  . [START ON 02/18/2015] aspirin EC  81 mg Oral Daily  . atorvastatin   20 mg Oral q1800  . [START ON 02/18/2015] clopidogrel  75 mg Oral Q breakfast  . metoprolol tartrate  12.5 mg Oral BID  . nitroGLYCERIN  0.5 inch Topical 4 times per day  . pantoprazole  40 mg Oral Daily  . sodium chloride  3 mL Intravenous Q12H   Infusions:  . sodium chloride      Assessment: 81yo male with history of HTN, HLD and GERD presents with chest pain. Pharmacy is consulted to dose heparin for ACS/chest pain. Labs are pending  Goal of Therapy:  Heparin level 0.3-0.7 units/ml Monitor platelets by anticoagulation protocol: Yes   Plan:  Give 4000 units bolus x 1 Start heparin infusion at 800 units/hr Check anti-Xa level in 8 hours and daily while on heparin Continue to monitor H&H and platelets  Andrey Cota. Diona Foley, PharmD Clinical Pharmacist Pager [phone] 02/17/2015,4:46 PM

## 2015-02-17 NOTE — Progress Notes (Signed)
Cardiology Office Note   Date:  02/17/2015   ID:  Kurt Elliott, DOB [birth year], MRN [id number]  PCP:  Renato Shin, MD  Cardiologist:  Dr. Ron Parker    Chief Complaint  Patient presents with  . Coronary Artery Disease    chest pain      History of Present Illness: Kurt Elliott is a 81 y.o. male who presents for follow up and now episodes of extreme fatigue   81 year old male with the above complex problem list. He was seen by Dr. Ron Parker in clinic on March 23 with complaints of increasing dyspnea on exertion. Decision was made to pursue diagnostic catheterization. This was performed on March 28 and revealed severe left circumflex and mid LAD disease. Previously placed stents in the proximal LAD and right coronary artery were patent. EF was 65-70%. Following catheterization, both the LAD and left circumflex were successfully treated with Promus drug eluting stents. Patient tolerated procedure well and was discharged the next day.   He had been doing well until last week he would have significant fatigue and his lopressor and amlodipine have been held.  Last week he also had nausea and vomiting and found to be impacted. That has resolved after laxatives and work by Dr. Deatra Ina his son in law. He has increasing DOE.  None at rest but with exertion. Still sleeps on his normal 2 pillows and no waking SOB.    Today while sitting he developed epigastric chest pressure.  Felt like someone sitting there. Did not think to take NTG and it resolved after several min.  None since this AM.   Family concerned about increasing symptoms and continued significant fatigue.  I discussed with Dr. Ron Parker- his primary cardiology MD.  Plan will be to admit and have cardiac cath in AM.       Past Medical History  Diagnosis Date  . Hypertension   . Hyperlipemia   . Unspecified disorder of liver   . Cellulitis, leg     left  . Pulmonary nodule, right     2008 / unchanged CT scan, July, 2009  . Abdominal  pain, left lower quadrant   . Adenocarcinoma of prostate   . Esophageal stricture   . Spinal stenosis   . Hyperglycemia   . Hyperthyroidism   . GERD (gastroesophageal reflux disease)   . Hx of colonic polyps   . Renal artery stenosis     Mild  . Atheroma     no penetrating ulcer, Abdominal aorta  . Inferior mesenteric artery injury     High-grade stenosis, CT scan, 2006  . Vertigo   . Diverticulum   . Incisional hernia   . Rash     Ticlid  . Cough     ACE Inhibitor, tolerates ARB  . Carotid artery disease     Doppler, 2006, minimal disease  //  Doppler, May, 2012, mild increase velocities, 40-59% bilateral stenoses  . Renal cyst     Hyperdense cyst lower pole left kidney  . Dizziness   . Unsteady gait   . Sinus bradycardia   . CAD (coronary artery disease), native coronary artery     a. cath 2008  b. cath 10/21/2014 3v dzs w/ patent RCA stent, severe stenosis of mid LCx/OM1 s/p DES, moderately severe diffuse mid LAD treated with DES, patent prox LAD stent  . Mild aortic stenosis     a. 09/2014 Echo: EF 65-70%, Gr 1 DD, no rwma, mild AS.  Past Surgical History  Procedure Laterality Date  . Esophagogastroduodenoscopy    . Cardiac catheterization    . Incisional hernia repair    . Left heart catheterization with coronary angiogram N/A 10/21/2014    Procedure: LEFT HEART CATHETERIZATION WITH CORONARY ANGIOGRAM;  Surgeon: Sherren Mocha, MD;  Location: [facility] CATH LAB;  Service: Cardiovascular;  Laterality: N/A;  . Percutaneous coronary stent intervention (pci-s)  10/21/2014    Procedure: PERCUTANEOUS CORONARY STENT INTERVENTION (PCI-S);  Surgeon: Sherren Mocha, MD;  Location: [facility] CATH LAB;  Service: Cardiovascular;;  Mid LAD and OM1     No current outpatient prescriptions on file.   No current facility-administered medications for this visit.    Allergies:   Isosorbide; Lisinopril; and Ticlopidine hcl    Social History:  The patient  reports that he has quit smoking. He  does not have any smokeless tobacco history on file. He reports that he does not drink alcohol or use illicit drugs.   Family History:  The patient's  Family hx noted in [place] in Epic   ROS:  General:no colds or fevers, some weight loss since April Skin:no rashes or ulcers, + bruises on extremities  HEENT:no blurred vision, no congestion CV:see HPI PUL:see HPI GI:no diarrhea +++constipation with clearing of impaction on th 22 nd of July. or melena, no indigestion GU:no hematuria, no dysuria MS:no joint pain, no claudication Neuro:no syncope, no lightheadedness Endo:no diabetes, no thyroid disease  Wt Readings from Last 3 Encounters:  02/17/15 150 lb 14.4 oz (68.448 kg)  02/17/15 154 lb (69.854 kg)  02/06/15 157 lb (71.215 kg)     PHYSICAL EXAM: VS:  BP 140/50 mmHg  Pulse 84  Ht 5\' 9"  (1.753 m)  Wt 154 lb (69.854 kg)  BMI 22.73 kg/m2 , BMI Body mass index is 22.73 kg/(m^2). General:Pleasant affect, NAD Skin:Warm and dry, brisk capillary refill HEENT:normocephalic, sclera clear, mucus membranes moist- somewhat hard of hearing.  Neck:supple, mild JVD sitting up right, no bruits  Heart:S1S2 RRR with 2/6 systolic murmur, no gallup, rub or click Lungs:clear to diminished without rales, rhonchi, or wheezes NGE:XBMW, non tender, + BS, do not palpate liver spleen or masses Ext:no lower ext edema, 1+ pedal pulses, 2+ radial pulses Neuro:alert and oriented X 3, MAE, follows commands, + facial symmetry    EKG:  EKG is ordered today. The ekg ordered today demonstrates SR non specific lat T wave mild depression.  Otherwise no acute changes.  Dr. Rayann Heman reviewed.    Recent Labs: 02/05/2015: ALT 26; BUN 34*; Creatinine, Ser 1.35; Potassium 4.9; Sodium 139 02/06/2015: Hemoglobin 12.4*; Platelets 193.0; TSH 3.96    Lipid Panel    Component Value Date/Time   CHOL 161 12/28/2013 1204   TRIG 258.0* 12/28/2013 1204   TRIG 137 05/23/2006 0817   HDL 37.50* 12/28/2013 1204   CHOLHDL 4  12/28/2013 1204   CHOLHDL 3.6 CALC 05/23/2006 0817   VLDL 51.6* 12/28/2013 1204   LDLCALC 72 12/28/2013 1204   LDLDIRECT 97.5 06/01/2011 1419       Other studies Reviewed: Additional studies/ records that were reviewed today include: cardiac cath from April..   ASSESSMENT AND PLAN:  1.  Unstable angina with chest pain today and increasing DOE with no acute CHF.  Increasing fatigue as well. Stents to RCA and LCX in April this year. Discussed with Dr. Ron Parker and plan for admit to tele with cardiac cath in AM.  Dr. Ron Parker talked with pt's son a urologist in [place] and this plan was  agreed upon.  Will add IV heparin and NTG paste. Will check serial troponins.   2. CAD- Status post April 2016 catheterization revealing severe LAD and circumflex disease with patent proximal LAD and RCA stents. New disease in the LAD and circumflex were successfully treated using Promus drug stents. Now with issues as above.   3. Hypertension:now off amlodipine and lopressor.  Will add low dose BB for now until results of cath are back.   3. Hyperlipidemia: He is on colestipol. Last LDL in June 2015 was 72.    Current medicines are reviewed with the patient today.  The patient Has no concerns regarding medicines.  The following changes have been made:  See above Labs/ tests ordered today include:see above  Disposition:   FU:  see above  Signed, Isaiah Serge, NP  02/17/2015 4:13 PM    [facility] [address], [place], [place], [place] Phone: [phone]; Fax: [phone]

## 2015-02-17 NOTE — Progress Notes (Signed)
Paged Cards on-call for Miralax and sleeping pill, per family request. Spoke with Dr Harl Bowie

## 2015-02-17 NOTE — Progress Notes (Signed)
Paged Cards Fellow. Family not happy with sleeping med prescribed' talked with Dr Marigene Ehlers

## 2015-02-17 NOTE — Patient Instructions (Addendum)
Medication Instructions:  Your physician recommends that you continue on your current medications as directed. Please refer to the Current Medication list given to you today.   Labwork: None   Testing/Procedures: Your physician has requested that you have a cardiac catheterization. Cardiac catheterization is used to diagnose and/or treat various heart conditions. Doctors may recommend this procedure for a number of different reasons. The most common reason is to evaluate chest pain. Chest pain can be a symptom of coronary artery disease (CAD), and cardiac catheterization can show whether plaque is narrowing or blocking your heart's arteries. This procedure is also used to evaluate the valves, as well as measure the blood flow and oxygen levels in different parts of your heart. For further information please visit [email or web address]. Please follow instruction sheet, as given.   Follow-Up: None   Any Other Special Instructions Will Be Listed Below (If Applicable). You are being admitted to [facility].  Please report to admitting. You will be taken to [facility] Room 5

## 2015-02-18 ENCOUNTER — Encounter (HOSPITAL_COMMUNITY): Payer: Self-pay | Admitting: Cardiology

## 2015-02-18 ENCOUNTER — Observation Stay (HOSPITAL_COMMUNITY): Payer: Medicare Other

## 2015-02-18 ENCOUNTER — Encounter (HOSPITAL_COMMUNITY)
Admission: AD | Disposition: A | Discharge status: Home Health | Payer: Self-pay | Source: Ambulatory Visit | Attending: Cardiology

## 2015-02-18 DIAGNOSIS — Z955 Presence of coronary angioplasty implant and graft: Secondary | ICD-10-CM | POA: Diagnosis not present

## 2015-02-18 DIAGNOSIS — I350 Nonrheumatic aortic (valve) stenosis: Secondary | ICD-10-CM

## 2015-02-18 DIAGNOSIS — N183 Chronic kidney disease, stage 3 unspecified: Secondary | ICD-10-CM

## 2015-02-18 DIAGNOSIS — R0602 Shortness of breath: Secondary | ICD-10-CM | POA: Diagnosis present

## 2015-02-18 DIAGNOSIS — Z8249 Family history of ischemic heart disease and other diseases of the circulatory system: Secondary | ICD-10-CM | POA: Diagnosis not present

## 2015-02-18 DIAGNOSIS — Z823 Family history of stroke: Secondary | ICD-10-CM | POA: Diagnosis not present

## 2015-02-18 DIAGNOSIS — K219 Gastro-esophageal reflux disease without esophagitis: Secondary | ICD-10-CM | POA: Diagnosis present

## 2015-02-18 DIAGNOSIS — D649 Anemia, unspecified: Secondary | ICD-10-CM | POA: Diagnosis present

## 2015-02-18 DIAGNOSIS — Z7982 Long term (current) use of aspirin: Secondary | ICD-10-CM | POA: Diagnosis not present

## 2015-02-18 DIAGNOSIS — R079 Chest pain, unspecified: Secondary | ICD-10-CM | POA: Diagnosis not present

## 2015-02-18 DIAGNOSIS — I200 Unstable angina: Secondary | ICD-10-CM | POA: Diagnosis not present

## 2015-02-18 DIAGNOSIS — E0590 Thyrotoxicosis, unspecified without thyrotoxic crisis or storm: Secondary | ICD-10-CM | POA: Diagnosis not present

## 2015-02-18 DIAGNOSIS — E785 Hyperlipidemia, unspecified: Secondary | ICD-10-CM | POA: Diagnosis present

## 2015-02-18 DIAGNOSIS — I080 Rheumatic disorders of both mitral and aortic valves: Secondary | ICD-10-CM | POA: Diagnosis not present

## 2015-02-18 DIAGNOSIS — R000 Tachycardia, unspecified: Secondary | ICD-10-CM | POA: Clinically undetermined

## 2015-02-18 DIAGNOSIS — I25110 Atherosclerotic heart disease of native coronary artery with unstable angina pectoris: Principal | ICD-10-CM

## 2015-02-18 DIAGNOSIS — Z79899 Other long term (current) drug therapy: Secondary | ICD-10-CM | POA: Diagnosis not present

## 2015-02-18 DIAGNOSIS — K5900 Constipation, unspecified: Secondary | ICD-10-CM | POA: Diagnosis present

## 2015-02-18 DIAGNOSIS — R5383 Other fatigue: Secondary | ICD-10-CM | POA: Diagnosis present

## 2015-02-18 DIAGNOSIS — Z87891 Personal history of nicotine dependence: Secondary | ICD-10-CM | POA: Diagnosis not present

## 2015-02-18 DIAGNOSIS — I129 Hypertensive chronic kidney disease with stage 1 through stage 4 chronic kidney disease, or unspecified chronic kidney disease: Secondary | ICD-10-CM | POA: Diagnosis present

## 2015-02-18 DIAGNOSIS — Z7902 Long term (current) use of antithrombotics/antiplatelets: Secondary | ICD-10-CM | POA: Diagnosis not present

## 2015-02-18 DIAGNOSIS — E639 Nutritional deficiency, unspecified: Secondary | ICD-10-CM | POA: Diagnosis not present

## 2015-02-18 DIAGNOSIS — Z8546 Personal history of malignant neoplasm of prostate: Secondary | ICD-10-CM | POA: Diagnosis not present

## 2015-02-18 HISTORY — PX: CARDIAC CATHETERIZATION: SHX172

## 2015-02-18 LAB — BASIC METABOLIC PANEL
Anion gap: 5 (ref 5–15)
Anion gap: 8 (ref 5–15)
BUN: 32 mg/dL — AB (ref 6–20)
BUN: 28 mg/dL — ABNORMAL HIGH (ref 6–20)
CALCIUM: 8.7 mg/dL — AB (ref 8.9–10.3)
CHLORIDE: 109 mmol/L (ref 101–111)
CO2: 22 mmol/L (ref 22–32)
CO2: 18 mmol/L — ABNORMAL LOW (ref 22–32)
Calcium: 8.8 mg/dL — ABNORMAL LOW (ref 8.9–10.3)
Chloride: 105 mmol/L (ref 101–111)
Creatinine, Ser: 1.52 mg/dL — ABNORMAL HIGH (ref 0.61–1.24)
Creatinine, Ser: 1.25 mg/dL — ABNORMAL HIGH (ref 0.61–1.24)
GFR calc Af Amer: 44 mL/min — ABNORMAL LOW (ref >60)
GFR calc non Af Amer: 38 mL/min — ABNORMAL LOW (ref >60)
GFR, EST AFRICAN AMERICAN: 56 mL/min — AB (ref >60)
GFR, EST NON AFRICAN AMERICAN: 48 mL/min — AB (ref >60)
GLUCOSE: 133 mg/dL — AB (ref 65–99)
Glucose, Bld: 105 mg/dL — ABNORMAL HIGH (ref 65–99)
POTASSIUM: 4.8 mmol/L (ref 3.5–5.1)
Potassium: 4.4 mmol/L (ref 3.5–5.1)
Sodium: 132 mmol/L — ABNORMAL LOW (ref 135–145)
Sodium: 135 mmol/L (ref 135–145)

## 2015-02-18 LAB — LIPID PANEL
CHOL/HDL RATIO: 3.5 ratio
CHOLESTEROL: 149 mg/dL (ref 0–200)
HDL: 43 mg/dL (ref >40)
LDL CALC: 96 mg/dL (ref 0–99)
Triglycerides: 51 mg/dL (ref <150)
VLDL: 10 mg/dL (ref 0–40)

## 2015-02-18 LAB — CBC
HEMATOCRIT: 28.2 % — AB (ref 39.0–52.0)
HEMOGLOBIN: 10 g/dL — AB (ref 13.0–17.0)
MCH: 32.4 pg (ref 26.0–34.0)
MCHC: 35.5 g/dL (ref 30.0–36.0)
MCV: 91.3 fL (ref 78.0–100.0)
PLATELETS: 124 10*3/uL — AB (ref 150–400)
RBC: 3.09 MIL/uL — AB (ref 4.22–5.81)
RDW: 12.5 % (ref 11.5–15.5)
WBC: 5.5 10*3/uL (ref 4.0–10.5)

## 2015-02-18 LAB — URINALYSIS, ROUTINE W REFLEX MICROSCOPIC
Bilirubin Urine: NEGATIVE
Glucose, UA: NEGATIVE mg/dL
HGB URINE DIPSTICK: NEGATIVE
Ketones, ur: NEGATIVE mg/dL
Leukocytes, UA: NEGATIVE
Nitrite: NEGATIVE
PROTEIN: NEGATIVE mg/dL
Specific Gravity, Urine: 1.014 (ref 1.005–1.030)
UROBILINOGEN UA: 1 mg/dL (ref 0.0–1.0)
pH: 6.5 (ref 5.0–8.0)

## 2015-02-18 LAB — POCT I-STAT 3, ART BLOOD GAS (G3+)
Acid-base deficit: 5 mmol/L — ABNORMAL HIGH (ref 0.0–2.0)
Bicarbonate: 18.8 mEq/L — ABNORMAL LOW (ref 20.0–24.0)
O2 Saturation: 95 %
TCO2: 20 mmol/L (ref 0–100)
pCO2 arterial: 29.3 mmHg — ABNORMAL LOW (ref 35.0–45.0)
pH, Arterial: 7.415 (ref 7.350–7.450)
pO2, Arterial: 74 mmHg — ABNORMAL LOW (ref 80.0–100.0)

## 2015-02-18 LAB — POCT I-STAT 3, VENOUS BLOOD GAS (G3P V)
Acid-base deficit: 4 mmol/L — ABNORMAL HIGH (ref 0.0–2.0)
BICARBONATE: 19.3 meq/L — AB (ref 20.0–24.0)
O2 Saturation: 77 %
PH VEN: 7.418 — AB (ref 7.250–7.300)
TCO2: 20 mmol/L (ref 0–100)
pCO2, Ven: 29.8 mmHg — ABNORMAL LOW (ref 45.0–50.0)
pO2, Ven: 40 mmHg (ref 30.0–45.0)

## 2015-02-18 LAB — PROTIME-INR
INR: 1.31 (ref 0.00–1.49)
Prothrombin Time: 16.4 seconds — ABNORMAL HIGH (ref 11.6–15.2)

## 2015-02-18 LAB — POCT ACTIVATED CLOTTING TIME: Activated Clotting Time: 189 seconds

## 2015-02-18 LAB — TROPONIN I: Troponin I: <0.03 ng/mL (ref <0.031)

## 2015-02-18 LAB — HEPARIN LEVEL (UNFRACTIONATED)
HEPARIN UNFRACTIONATED: 0.59 [IU]/mL (ref 0.30–0.70)
Heparin Unfractionated: 0.53 IU/mL (ref 0.30–0.70)

## 2015-02-18 SURGERY — RIGHT/LEFT HEART CATH AND CORONARY ANGIOGRAPHY

## 2015-02-18 MED ORDER — NITROGLYCERIN 1 MG/10 ML FOR IR/CATH LAB
INTRA_ARTERIAL | Status: AC
  Filled 2015-02-18: qty 10

## 2015-02-18 MED ORDER — HEPARIN SODIUM (PORCINE) 1000 UNIT/ML IJ SOLN
INTRAMUSCULAR | Status: DC | PRN
  Administered 2015-02-18: 3500 [IU] via INTRAVENOUS

## 2015-02-18 MED ORDER — LIDOCAINE HCL (PF) 1 % IJ SOLN
INTRAMUSCULAR | Status: AC
  Filled 2015-02-18: qty 30

## 2015-02-18 MED ORDER — TRAZODONE HCL 50 MG PO TABS
50.0000 mg | ORAL_TABLET | Freq: Every evening | ORAL | Status: AC | PRN
  Administered 2015-02-18: 50 mg via ORAL
  Filled 2015-02-18: qty 1

## 2015-02-18 MED ORDER — SODIUM CHLORIDE 0.9 % IJ SOLN: 3.0000 mL | INTRAMUSCULAR | Status: DC | PRN

## 2015-02-18 MED ORDER — SODIUM CHLORIDE 0.9 % IV SOLN: 250.0000 mL | INTRAVENOUS | Status: DC | PRN

## 2015-02-18 MED ORDER — FENTANYL CITRATE (PF) 100 MCG/2ML IJ SOLN
INTRAMUSCULAR | Status: DC | PRN
  Administered 2015-02-18: 25 ug via INTRAVENOUS

## 2015-02-18 MED ORDER — VERAPAMIL HCL 2.5 MG/ML IV SOLN
INTRAVENOUS | Status: AC
  Filled 2015-02-18: qty 2

## 2015-02-18 MED ORDER — SODIUM CHLORIDE 0.9 % IJ SOLN
3.0000 mL | Freq: Two times a day (BID) | INTRAMUSCULAR | Status: DC
  Administered 2015-02-18: 3 mL via INTRAVENOUS

## 2015-02-18 MED ORDER — SODIUM CHLORIDE 0.9 % WEIGHT BASED INFUSION
3.0000 mL/kg/h | INTRAVENOUS | Status: AC
Start: 2015-02-18 — End: 2015-02-18
  Administered 2015-02-18: 3 mL/kg/h via INTRAVENOUS

## 2015-02-18 MED ORDER — HEPARIN (PORCINE) IN NACL 2-0.9 UNIT/ML-% IJ SOLN
INTRAMUSCULAR | Status: AC
  Filled 2015-02-18: qty 1000

## 2015-02-18 MED ORDER — IOHEXOL 350 MG/ML SOLN
INTRAVENOUS | Status: DC | PRN
  Administered 2015-02-18: 70 mL via INTRAVENOUS

## 2015-02-18 MED ORDER — RADIAL COCKTAIL (HEPARIN/VERAPAMIL/LIDOCAINE/NITRO)
Status: DC | PRN
  Administered 2015-02-18: 1 via INTRA_ARTERIAL

## 2015-02-18 MED ORDER — FENTANYL CITRATE (PF) 100 MCG/2ML IJ SOLN
INTRAMUSCULAR | Status: AC
  Filled 2015-02-18: qty 2

## 2015-02-18 SURGICAL SUPPLY — 17 items
CATH INFINITI 5 FR STR PIGTAIL (CATHETERS) ×3 IMPLANT
CATH INFINITI 5FR ANG PIGTAIL (CATHETERS) ×3 IMPLANT
CATH OPTITORQUE TIG 4.0 5F (CATHETERS) ×3 IMPLANT
CATH SWAN GANZ 7F STRAIGHT (CATHETERS) ×3 IMPLANT
DEVICE RAD COMP TR BAND LRG (VASCULAR PRODUCTS) ×3 IMPLANT
GLIDESHEATH SLEND A-KIT 6F 22G (SHEATH) ×3 IMPLANT
KIT HEART LEFT (KITS) ×3 IMPLANT
KIT HEART RIGHT NAMIC (KITS) ×3 IMPLANT
PACK CARDIAC CATHETERIZATION (CUSTOM PROCEDURE TRAY) ×3 IMPLANT
SHEATH FAST CATH BRACH 5F 5CM (SHEATH) ×3 IMPLANT
SHEATH PINNACLE 5F 10CM (SHEATH) IMPLANT
SHEATH PINNACLE 7F 10CM (SHEATH) ×3 IMPLANT
SYR MEDRAD MARK V 150ML (SYRINGE) ×3 IMPLANT
TRANSDUCER W/STOPCOCK (MISCELLANEOUS) ×6 IMPLANT
TUBING CIL FLEX 10 FLL-RA (TUBING) ×3 IMPLANT
WIRE EMERALD 3MM-J .035X150CM (WIRE) ×3 IMPLANT
WIRE SAFE-T 1.5MM-J .035X260CM (WIRE) ×3 IMPLANT

## 2015-02-18 NOTE — Interval H&P Note (Signed)
History and Physical Interval Note:  02/18/2015 10:36 AM  Kurt Elliott  has presented today for surgery, with the diagnosis of cp- Progressive Unstable Angina (~class II-III Sx per d/w Dr. Ron Parker) & Aortic Stenosis (LVOTgradient)  The various methods of treatment have been discussed with the patient and family. After consideration of risks, benefits and other options for treatment, the patient has consented to  Procedure(s): Right/Left Heart Cath and Coronary Angiography (N/A) as a surgical intervention .  The patient's history has been reviewed, patient examined, no change in status, stable for surgery.  I have reviewed the patient's chart and labs.  Questions were answered to the patient's satisfaction.     [place]  Cath Lab Visit (complete for each Cath Lab visit)  Clinical Evaluation Leading to the Procedure:   ACS: No.  Non-ACS:    Anginal Classification: CCS III  Anti-ischemic medical therapy: Minimal Therapy (1 class of medications)  Non-Invasive Test Results: No non-invasive testing performed ; Echo with Intracavitary LVOT gradient. Prior CABG: No previous CABG   Ischemic Symptoms? CCS III (Marked limitation of ordinary activity) Anti-ischemic Medical Therapy? Minimal Therapy (1 class of medications) Non-invasive Test Results? No non-invasive testing performed Prior CABG? No Previous CABG   Patient Information:   1-2V CAD, no prox LAD  A (7)  Indication: 20; Score: 7   Patient Information:   1-2V-CAD with DS 50-60% With No FFR, No IVUS  I (3)  Indication: 21; Score: 3   Patient Information:   1-2V-CAD with DS 50-60% With FFR  A (7)  Indication: 22; Score: 7   Patient Information:   1-2V-CAD with DS 50-60% With FFR>0.8, IVUS not significant  I (2)  Indication: 23; Score: 2   Patient Information:   3V-CAD without LMCA With Abnormal LV systolic function  A (9)  Indication: 48; Score: 9   Patient Information:   LMCA-CAD  A (9)   Indication: 49; Score: 9   Patient Information:   2V-CAD with prox LAD PCI  A (7)  Indication: 62; Score: 7   Patient Information:   2V-CAD with prox LAD CABG  A (8)  Indication: 62; Score: 8   Patient Information:   3V-CAD without LMCA With Low CAD burden(i.e., 3 focal stenoses, low SYNTAX score) PCI  A (7)  Indication: 63; Score: 7   Patient Information:   3V-CAD without LMCA With Low CAD burden(i.e., 3 focal stenoses, low SYNTAX score) CABG  A (9)  Indication: 63; Score: 9   Patient Information:   3V-CAD without LMCA E06c - Intermediate-high CAD burden (i.e., multiple diffuse lesions, presence of CTO, or high SYNTAX score) PCI  U (4)  Indication: 64; Score: 4   Patient Information:   3V-CAD without LMCA E06c - Intermediate-high CAD burden (i.e., multiple diffuse lesions, presence of CTO, or high SYNTAX score) CABG  A (9)  Indication: 64; Score: 9   Patient Information:   LMCA-CAD With Isolated LMCA stenosis  PCI  U (6)  Indication: 65; Score: 6   Patient Information:   LMCA-CAD With Isolated LMCA stenosis  CABG  A (9)  Indication: 65; Score: 9   Patient Information:   LMCA-CAD Additional CAD, low CAD burden (i.e., 1- to 2-vessel additional involvement, low SYNTAX score) PCI  U (5)  Indication: 66; Score: 5   Patient Information:   LMCA-CAD Additional CAD, low CAD burden (i.e., 1- to 2-vessel additional involvement, low SYNTAX score) CABG  A (9)  Indication: 66; Score: 9  Patient Information:   LMCA-CAD Additional CAD, intermediate-high CAD burden (i.e., 3-vessel involvement, presence of CTO, or high SYNTAX score) PCI  I (3)  Indication: 67; Score: 3   Patient Information:   LMCA-CAD Additional CAD, intermediate-high CAD burden (i.e., 3-vessel involvement, presence of CTO, or high SYNTAX score) CABG  A (9)  Indication: 67; Score: 9   Shannen Vernon W, M.D., M.S. Interventional Cardiologist   Pager #  [phone]

## 2015-02-18 NOTE — Progress Notes (Signed)
SUBJECTIVE:   The patient is feeling well today. He is not having any resting chest pain or shortness of breath. Two-dimensional echo has been done. There is a vigorous left ventricular function. There is an intracavitary radius of 45 mmHg. There is no left ventricle or outflow tract gradient. There is no systolic anterior motion of the mitral valve. Right heart function is good. Creatinine had gone up slightly this morning. He is receiving IV fluids. If repeat creatinine is stable, he will undergo cardiac catheterization today. I spoke with his son-in-law Dr. Erskine Emery today. We are all in agreement and the approach.   Filed Vitals:   02/17/15 1545 02/17/15 2100 02/18/15 0428  BP: 151/54 122/46 101/41  Pulse: 84 95 66  Temp: 97.6 F (36.4 C) 97.7 F (36.5 C) 98.4 F (36.9 C)  TempSrc: Oral Oral Oral  Resp: 17 16 16   Height: 5\' 10"  (1.778 m)    Weight: 150 lb 14.4 oz (68.448 kg)  150 lb 11.2 oz (68.357 kg)  SpO2: 100% 97% 96%    No intake or output data in the 24 hours ending 02/18/15 1004  LABS: Basic Metabolic Panel:  Recent Labs  02/17/15 1723 02/18/15 0416  NA 136 132*  K 4.7 4.8  CL 105 105  CO2 22 22  GLUCOSE 108* 105*  BUN 36* 32*  CREATININE 1.49* 1.52*  CALCIUM 9.8 8.7*   Liver Function Tests: No results for input(s): AST, ALT, ALKPHOS, BILITOT, PROT, ALBUMIN in the last 72 hours. No results for input(s): LIPASE, AMYLASE in the last 72 hours. CBC:  Recent Labs  02/17/15 1723 02/18/15 0416  WBC 7.5 5.5  HGB 12.3* 10.0*  HCT 34.1* 28.2*  MCV 90.7 91.3  PLT 181 124*   Cardiac Enzymes:  Recent Labs  02/17/15 1723 02/17/15 2244 02/18/15 0416  TROPONINI <0.03 <0.03 <0.03   BNP: Invalid input(s): POCBNP D-Dimer: No results for input(s): DDIMER in the last 72 hours. Hemoglobin A1C: No results for input(s): HGBA1C in the last 72 hours. Fasting Lipid Panel:  Recent Labs  02/18/15 0416  CHOL 149  HDL 43  LDLCALC 96  TRIG 51  CHOLHDL  3.5   Thyroid Function Tests: No results for input(s): TSH, T4TOTAL, T3FREE, THYROIDAB in the last 72 hours.  Invalid input(s): FREET3  RADIOLOGY: No results found.  PHYSICAL EXAM   patient is oriented to person time or place. Head is atraumatic. Sclerae and conjunctivae are normal per there is no jugular venous distention. Lungs are clear. Respiratory effort is nonlabored. Cardiac exam reveals S1 and S2. There is a crescendo decrescendo systolic murmur. The abdomen is soft. There is no peripheral edema.   TELEMETRY: I have reviewed telemetry today February 17, 2015. There is sinus rhythm. At times he does have an increase in his rate in what appears to be sinus tachycardia. There is no proof yet of a supraventricular arrhythmia.   ASSESSMENT AND PLAN:     Chronic kidney disease     The patient's creatinine was in the range of 1.35. It went up to 1.59 this morning. He is receiving IV fluids. I have just ordered a repeat chemistry value. If his lab is stable, we will proceed with cardiac catheterization. If his renal function is not stable, the cath will be postponed.     Essential hypertension    At one point recently he was on amlodipine along with low-dose Lopressor. Because of his episodes of fatigue, I decided to stop his amlodipine.  We will continue to watch his blood pressure.    CAD (coronary artery disease)     He has documented coronary disease and had an intervention earlier this year. His recent spells of fatigue are difficult to understand. There is an exertional shortness of breath component. There has been some exertional chest discomfort. Cardiac catheterization will be done to fully assess his coronaries to be sure that his symptoms are not an anginal equivalent.    Ejection fraction      Ejection fraction today is 65-70%. His LV cavity is small.    Aortic stenosis     The patient has hemodynamics consistent with mild aortic stenosis. This is discussed completely and the  echo note today. The peak gradient is in the range of 20 mmHg with a mean gradient of 12 mmHg. The stroke volume index is not in the range that would suggest that the patient could have normal EF/low gradient/severe aortic stenosis. We are keeping this diagnosis in mind to be extra complete.    Fatigue     He has had episodes of marked fatigue. Etiology is not clear. With some of this last week, he was noted to be constipated. When this was relieved, he felt better. However there is an exertional shortness of breath component to this fatigue. This is being evaluated fully.    Exertional shortness of breath      Etiology of his exertional shortness of breath is not yet clear. We are being careful to rule out significant ischemic disease. We are thinking about his aortic valve. I have noted that he's had some sinus tachycardia. There is no proof yet that any type of arrhythmia is causing his symptoms. We may consider an event recorder if further data is needed after the hospitalization. At this point we have no evidence of pulmonary emboli. This diagnosis always has to be kept in mind. I see now that a chest x-ray has not yet been done this admission. I will order it now. We will also have to be sure that he has no indication for home oxygen.    Sinus tachycardia     He had been on Lopressor 25 mg twice a day. Because of his fatigue, we had reduced his dose to 12.5 twice a day. He was actually off the drug for a short period of time. Currently he is on 12.5 twice a day in the hospital. We know that his recent thyroid functions were normal.   Dola Argyle 02/18/2015 10:04 AM

## 2015-02-18 NOTE — Progress Notes (Signed)
Pt ambulated in the hall to the nursing station and back without any complications. Pt may benefit from cane or walker, overall pt did well. 02 saturations 97-98% while walking, pt was on room air. Etta Quill, RN

## 2015-02-18 NOTE — Progress Notes (Signed)
Family requested a Social Work consult concerning home health questions and needs

## 2015-02-18 NOTE — Progress Notes (Signed)
Pt feeling slightly better this afternoon. Pt up in the chair, call bell within reach, family is present. Etta Quill, RN

## 2015-02-18 NOTE — Progress Notes (Signed)
Echocardiogram 2D Echocardiogram has been performed.  Tresa Res 02/18/2015, 9:20 AM

## 2015-02-18 NOTE — Progress Notes (Signed)
Order for sheath removal verified per post procedural orders. Procedure explained to patient and Rt femoral vein access site assessed: level 0, palpable posterior tibial pulses. 7 FR [place] Sheath removed and manual pressure applied for10 minutes. Pre, peri, & post procedural vitals: HR 74, RR 14, O2 Sat upper 100, BP 110/39, Pain 0. Distal pulses remained intact after sheath removal. Access site level 0 and dressed with 4X4 gauze and tegaderm.  Post procedural instructions discussed with return demonstration from patient. Sandi Elianny Buxbaum RTR, RCIS

## 2015-02-18 NOTE — Progress Notes (Signed)
[place] for Heparin Indication: chest pain/ACS  Allergies  Allergen Reactions  . Isosorbide Other (See Comments)    "severe Headache" per patient  . Lisinopril Cough  . Ticlopidine Hcl Rash    Patient Measurements: Height: 5\' 10"  (177.8 cm) Weight: 150 lb 11.2 oz (68.357 kg) IBW/kg (Calculated) : 73 Heparin Dosing Weight: 68 kg  Vital Signs: Temp: 98.4 F (36.9 C) (07/26 0428) Temp Source: Oral (07/26 0428) BP: 101/41 mmHg (07/26 0428) Pulse Rate: 66 (07/26 0428)  Labs:  Recent Labs  02/17/15 1723 02/17/15 2244 02/18/15 0416  HGB 12.3*  --  10.0*  HCT 34.1*  --  28.2*  PLT 181  --  124*  HEPARINUNFRC  --   --  0.59  CREATININE 1.49*  --  1.52*  TROPONINI <0.03 <0.03 <0.03    Estimated Creatinine Clearance: 30 mL/min (by C-G formula based on Cr of 1.52).  Assessment: 81 y.o. male with chest pain for heparin  Goal of Therapy:  Heparin level 0.3-0.7 units/ml Monitor platelets by anticoagulation protocol: Yes   Plan:  Continue Heparin at current rate  Recheck level later this morning to verify  Phillis Knack, PharmD, BCPS  02/18/2015,5:12 AM

## 2015-02-18 NOTE — H&P (View-Only) (Signed)
SUBJECTIVE:   The patient is feeling well today. He is not having any resting chest pain or shortness of breath. Two-dimensional echo has been done. There is a vigorous left ventricular function. There is an intracavitary radius of 45 mmHg. There is no left ventricle or outflow tract gradient. There is no systolic anterior motion of the mitral valve. Right heart function is good. Creatinine had gone up slightly this morning. He is receiving IV fluids. If repeat creatinine is stable, he will undergo cardiac catheterization today. I spoke with his son-in-law Dr. Erskine Emery today. We are all in agreement and the approach.   Filed Vitals:   02/17/15 1545 02/17/15 2100 02/18/15 0428  BP: 151/54 122/46 101/41  Pulse: 84 95 66  Temp: 97.6 F (36.4 C) 97.7 F (36.5 C) 98.4 F (36.9 C)  TempSrc: Oral Oral Oral  Resp: 17 16 16   Height: 5\' 10"  (1.778 m)    Weight: 150 lb 14.4 oz (68.448 kg)  150 lb 11.2 oz (68.357 kg)  SpO2: 100% 97% 96%    No intake or output data in the 24 hours ending 02/18/15 1004  LABS: Basic Metabolic Panel:  Recent Labs  02/17/15 1723 02/18/15 0416  NA 136 132*  K 4.7 4.8  CL 105 105  CO2 22 22  GLUCOSE 108* 105*  BUN 36* 32*  CREATININE 1.49* 1.52*  CALCIUM 9.8 8.7*   Liver Function Tests: No results for input(s): AST, ALT, ALKPHOS, BILITOT, PROT, ALBUMIN in the last 72 hours. No results for input(s): LIPASE, AMYLASE in the last 72 hours. CBC:  Recent Labs  02/17/15 1723 02/18/15 0416  WBC 7.5 5.5  HGB 12.3* 10.0*  HCT 34.1* 28.2*  MCV 90.7 91.3  PLT 181 124*   Cardiac Enzymes:  Recent Labs  02/17/15 1723 02/17/15 2244 02/18/15 0416  TROPONINI <0.03 <0.03 <0.03   BNP: Invalid input(s): POCBNP D-Dimer: No results for input(s): DDIMER in the last 72 hours. Hemoglobin A1C: No results for input(s): HGBA1C in the last 72 hours. Fasting Lipid Panel:  Recent Labs  02/18/15 0416  CHOL 149  HDL 43  LDLCALC 96  TRIG 51  CHOLHDL  3.5   Thyroid Function Tests: No results for input(s): TSH, T4TOTAL, T3FREE, THYROIDAB in the last 72 hours.  Invalid input(s): FREET3  RADIOLOGY: No results found.  PHYSICAL EXAM   patient is oriented to person time or place. Head is atraumatic. Sclerae and conjunctivae are normal per there is no jugular venous distention. Lungs are clear. Respiratory effort is nonlabored. Cardiac exam reveals S1 and S2. There is a crescendo decrescendo systolic murmur. The abdomen is soft. There is no peripheral edema.   TELEMETRY: I have reviewed telemetry today February 17, 2015. There is sinus rhythm. At times he does have an increase in his rate in what appears to be sinus tachycardia. There is no proof yet of a supraventricular arrhythmia.   ASSESSMENT AND PLAN:     Chronic kidney disease     The patient's creatinine was in the range of 1.35. It went up to 1.59 this morning. He is receiving IV fluids. I have just ordered a repeat chemistry value. If his lab is stable, we will proceed with cardiac catheterization. If his renal function is not stable, the cath will be postponed.     Essential hypertension    At one point recently he was on amlodipine along with low-dose Lopressor. Because of his episodes of fatigue, I decided to stop his amlodipine.  We will continue to watch his blood pressure.    CAD (coronary artery disease)     He has documented coronary disease and had an intervention earlier this year. His recent spells of fatigue are difficult to understand. There is an exertional shortness of breath component. There has been some exertional chest discomfort. Cardiac catheterization will be done to fully assess his coronaries to be sure that his symptoms are not an anginal equivalent.    Ejection fraction      Ejection fraction today is 65-70%. His LV cavity is small.    Aortic stenosis     The patient has hemodynamics consistent with mild aortic stenosis. This is discussed completely and the  echo note today. The peak gradient is in the range of 20 mmHg with a mean gradient of 12 mmHg. The stroke volume index is not in the range that would suggest that the patient could have normal EF/low gradient/severe aortic stenosis. We are keeping this diagnosis in mind to be extra complete.    Fatigue     He has had episodes of marked fatigue. Etiology is not clear. With some of this last week, he was noted to be constipated. When this was relieved, he felt better. However there is an exertional shortness of breath component to this fatigue. This is being evaluated fully.    Exertional shortness of breath      Etiology of his exertional shortness of breath is not yet clear. We are being careful to rule out significant ischemic disease. We are thinking about his aortic valve. I have noted that he's had some sinus tachycardia. There is no proof yet that any type of arrhythmia is causing his symptoms. We may consider an event recorder if further data is needed after the hospitalization. At this point we have no evidence of pulmonary emboli. This diagnosis always has to be kept in mind. I see now that a chest x-ray has not yet been done this admission. I will order it now. We will also have to be sure that he has no indication for home oxygen.    Sinus tachycardia     He had been on Lopressor 25 mg twice a day. Because of his fatigue, we had reduced his dose to 12.5 twice a day. He was actually off the drug for a short period of time. Currently he is on 12.5 twice a day in the hospital. We know that his recent thyroid functions were normal.   Dola Argyle 02/18/2015 10:04 AM

## 2015-02-19 ENCOUNTER — Encounter (HOSPITAL_COMMUNITY): Payer: Self-pay | Admitting: Nurse Practitioner

## 2015-02-19 DIAGNOSIS — D649 Anemia, unspecified: Secondary | ICD-10-CM

## 2015-02-19 DIAGNOSIS — I200 Unstable angina: Secondary | ICD-10-CM

## 2015-02-19 DIAGNOSIS — E639 Nutritional deficiency, unspecified: Secondary | ICD-10-CM

## 2015-02-19 DIAGNOSIS — R5381 Other malaise: Secondary | ICD-10-CM

## 2015-02-19 DIAGNOSIS — I340 Nonrheumatic mitral (valve) insufficiency: Secondary | ICD-10-CM

## 2015-02-19 DIAGNOSIS — G4700 Insomnia, unspecified: Secondary | ICD-10-CM

## 2015-02-19 LAB — CBC
HCT: 26.8 % — ABNORMAL LOW (ref 39.0–52.0)
HCT: 29 % — ABNORMAL LOW (ref 39.0–52.0)
Hemoglobin: 9.7 g/dL — ABNORMAL LOW (ref 13.0–17.0)
Hemoglobin: 10.5 g/dL — ABNORMAL LOW (ref 13.0–17.0)
MCH: 33 pg (ref 26.0–34.0)
MCH: 33.3 pg (ref 26.0–34.0)
MCHC: 36.2 g/dL — ABNORMAL HIGH (ref 30.0–36.0)
MCHC: 36.2 g/dL — ABNORMAL HIGH (ref 30.0–36.0)
MCV: 91.2 fL (ref 78.0–100.0)
MCV: 92.1 fL (ref 78.0–100.0)
Platelets: 120 10*3/uL — ABNORMAL LOW (ref 150–400)
Platelets: 134 10*3/uL — ABNORMAL LOW (ref 150–400)
RBC: 2.94 MIL/uL — ABNORMAL LOW (ref 4.22–5.81)
RBC: 3.15 MIL/uL — ABNORMAL LOW (ref 4.22–5.81)
RDW: 12.3 % (ref 11.5–15.5)
RDW: 12.5 % (ref 11.5–15.5)
WBC: 5.4 10*3/uL (ref 4.0–10.5)
WBC: 5.6 10*3/uL (ref 4.0–10.5)

## 2015-02-19 LAB — BASIC METABOLIC PANEL
ANION GAP: 4 — AB (ref 5–15)
BUN: 20 mg/dL (ref 6–20)
CO2: 20 mmol/L — ABNORMAL LOW (ref 22–32)
CREATININE: 1.2 mg/dL (ref 0.61–1.24)
Calcium: 8.8 mg/dL — ABNORMAL LOW (ref 8.9–10.3)
Chloride: 109 mmol/L (ref 101–111)
GFR calc Af Amer: 59 mL/min — ABNORMAL LOW (ref >60)
GFR calc non Af Amer: 51 mL/min — ABNORMAL LOW (ref >60)
Glucose, Bld: 103 mg/dL — ABNORMAL HIGH (ref 65–99)
Potassium: 5.4 mmol/L — ABNORMAL HIGH (ref 3.5–5.1)
Sodium: 133 mmol/L — ABNORMAL LOW (ref 135–145)

## 2015-02-19 MED ORDER — METHIMAZOLE 5 MG PO TABS: ORAL_TABLET | ORAL | Status: DC

## 2015-02-19 MED ORDER — ZALEPLON 5 MG PO CAPS: 5.0000 mg | ORAL_CAPSULE | Freq: Every evening | ORAL | Status: DC | PRN

## 2015-02-19 MED ORDER — BOOST PLUS PO LIQD
237.0000 mL | ORAL | Status: DC
  Filled 2015-02-19 (×2): qty 237

## 2015-02-19 MED ORDER — METOPROLOL TARTRATE 25 MG PO TABS: 12.5000 mg | ORAL_TABLET | Freq: Two times a day (BID) | ORAL | Status: DC

## 2015-02-19 MED ORDER — ENSURE ENLIVE PO LIQD: 237.0000 mL | Freq: Two times a day (BID) | ORAL | Status: DC

## 2015-02-19 NOTE — Progress Notes (Signed)
Occupational Therapy Evaluation Patient Details Name: Kurt Elliott MRN: [id number] DOB: 02-26-1921 Today's Date: 02/19/2015    History of Present Illness patient is a 81 yo male s/p Right/Left Heart Cath and Coronary Angiography.   Clinical Impression   PTA, pt independent with mobility and ADL. Pt currently requires min A with mobility due to risk of falls and being unsteady. Recommended  to initially have 24/7 S, which they state can be set up. Emphasized importance of someone physically being with the pt during all ADL and mobility to reduce risk of falls.  Pt verbalized understanding of recommendations. Pt to continue with HHOT/PT.     Follow Up Recommendations  Home health OT;Supervision/Assistance - 24 hour    Equipment Recommendations  Other (comment) (rollator)    Recommendations for Other Services       Precautions / Restrictions Precautions Precautions: Fall Restrictions Weight Bearing Restrictions: No      Mobility Bed Mobility        Transfers Overall transfer level: Needs assistance Equipment used: None   Sit to Stand: Min guard         General transfer comment: VCs for safety, assist for stability upon standing    Balance     Sitting balance-Leahy Scale: Fair Sitting balance - Comments: able to put his slipper on sitting EOB     Standing balance-Leahy Scale: Fair                              ADL Overall ADL's : Needs assistance/impaired     Grooming: Min guard   Upper Body Bathing: Supervision/ safety;Set up;Sitting   Lower Body Bathing: Minimal assistance;Sit to/from stand   Upper Body Dressing : Set up;Supervision/safety;Sitting   Lower Body Dressing: Minimal assistance;Sit to/from stand   Toilet Transfer: Minimal assistance;Ambulation;Comfort height toilet;Grab bars   Toileting- Clothing Manipulation and Hygiene: Minimal assistance;Sit to/from stand       Functional mobility during ADLs: Minimal  assistance;Rolling walker  Discussed recommendations in detail with family, including the need for initial 24/7 assistance to reduce risk of falls. Reviewed with family proper use of RW and need for pt to keep RW with him at all times. Pt let go of RW once at bathroom and required mod A to regain balance to prevent fall. Son present and witnessed event. Son verbalized understanding of need for physical assistance.      Vision     Perception     Praxis      Pertinent Vitals/Pain  VSS. No c/o pain     Hand Dominance Right   Extremity/Trunk Assessment Upper Extremity Assessment Upper Extremity Assessment: Overall WFL for tasks assessed   Lower Extremity Assessment Lower Extremity Assessment: Defer to PT evaluation   Cervical / Trunk Assessment Cervical / Trunk Assessment: Kyphotic   Communication Communication Communication: HOH   Cognition Arousal/Alertness: Awake/alert Behavior During Therapy: WFL for tasks assessed/performed Overall Cognitive Status: No family/caregiver present to determine baseline cognitive functioning (baseline STM deficits)                     General Comments   Family asking many questions regarding safe D/C plan. Recommended they discuss these concerns with HHOT/PT. Also recommended family increase their caregiver hours.     Exercises       Shoulder Instructions      Home Living Family/patient expects to be discharged to:: Private residence Living Arrangements: Spouse/significant other Available  Help at Discharge: Family Type of Home: House Home Access: Stairs to enter [employer] of Steps: 2 Entrance Stairs-Rails: None Home Layout: One level     Bathroom Shower/Tub: Occupational psychologist: Handicapped height (one standard toilet) Bathroom Accessibility: Yes How Accessible: Accessible via walker Home Equipment: [place] - 2 wheels;Cane - single point;Wheelchair - manual          Prior  Functioning/Environment Level of Independence: Independent             OT Diagnosis: Generalized weakness   OT Problem List: Decreased strength;Decreased activity tolerance;Impaired balance (sitting and/or standing);Decreased safety awareness;Decreased knowledge of use of DME or AE;Cardiopulmonary status limiting activity   OT Treatment/Interventions: Self-care/ADL training;Therapeutic exercise;Energy conservation;DME and/or AE instruction;Therapeutic activities;Patient/family education;Balance training    OT Goals(Current goals can be found in the care plan section) Acute Rehab OT Goals Patient Stated Goal: to go home OT Goal Formulation: All assessment and education complete, DC therapy (acute OT)  OT Frequency: Min 2X/week   Barriers to D/C:            Co-evaluation              End of Session Equipment Utilized During Treatment: Gait belt;Rolling walker Nurse Communication: Mobility status  Activity Tolerance: Patient tolerated treatment well Patient left: in chair;with call bell/phone within reach;with family/visitor present   Time: 6468-0321 OT Time Calculation (min): 42 min Charges:  OT General Charges $OT Visit: 1 Procedure OT Evaluation $Initial OT Evaluation Tier I: 1 Procedure OT Treatments $Self Care/Home Management : 23-37 mins G-Codes:    Yevonne Yokum,HILLARY March 18, 2015, 3:56 PM   [facility], OTR/L  [phone] 03/18/15

## 2015-02-19 NOTE — Progress Notes (Signed)
Pt being discharged home with family. Discharge instructions gone over with pt son. Pt son and pt verbalized understanding, IV removed. VSS Seriah Brotzman V, RN

## 2015-02-19 NOTE — Evaluation (Signed)
Physical Therapy Evaluation Patient Details Name: Kurt Elliott MRN: [id number] DOB: 1921/02/21 Today's Date: 02/19/2015   History of Present Illness  patient is a 81 yo male s/p Right/Left Heart Cath and Coronary Angiography.  Clinical Impression  Patient demonstrates deficits in functional mobility as indicated below. Will benefit from continued skilled PT to address deficits and maximize function. Will see as indicated and progress as tolerated. Highly encourage use of AD upon discharge initially. Recommend HHPT and supervision. OF NOTE: VSS throughout session.    Follow Up Recommendations Home health PT;Supervision/Assistance - 24 hour;Supervision for mobility/OOB    Equipment Recommendations  None recommended by PT    Recommendations for Other Services       Precautions / Restrictions Precautions Precautions: Fall Restrictions Weight Bearing Restrictions: No      Mobility  Bed Mobility Overal bed mobility: Needs Assistance Bed Mobility: Rolling;Supine to Sit Rolling: Supervision   Supine to sit: Min assist     General bed mobility comments: increased time to perform, min assist to pull trunk to EOB  Transfers Overall transfer level: Needs assistance Equipment used: None Transfers: Sit to/from Stand Sit to Stand: Min guard         General transfer comment: VCs for safety, assist for stability upon standing  Ambulation/Gait Ambulation/Gait assistance: Min guard;Min assist Ambulation Distance (Feet): 190 Feet Assistive device: None Gait Pattern/deviations: Step-through pattern;Decreased stride length;Decreased weight shift to right;Shuffle;Drifts right/left;Trunk flexed;Narrow base of support Gait velocity: decreased despite cues Gait velocity interpretation: Below normal speed for age/gender General Gait Details: patient slow with cadence despite VCs for increased speed. Patient with some instability noted, occassional RLE weakness with 'hitchlike buckling'  requiring minimal assist to stabilize (x3). Patient may benefit from use of AD intiially upon discharge  Stairs            Wheelchair Mobility    Modified Rankin (Stroke Patients Only)       Balance Overall balance assessment: Needs assistance;History of Falls Sitting-balance support: No upper extremity supported;Feet supported Sitting balance-Leahy Scale: Fair Sitting balance - Comments: able to put his slipper on sitting EOB   Standing balance support: During functional activity Standing balance-Leahy Scale: Fair                               Pertinent Vitals/Pain Pain Assessment: No/denies pain    Home Living Family/patient expects to be discharged to:: Private residence Living Arrangements: Spouse/significant other Available Help at Discharge: Family Type of Home: House Home Access: Stairs to enter Entrance Stairs-Rails: None Entrance Stairs-Number of Steps: 2 Home Layout: One level Home Equipment: Environmental consultant - 2 wheels;Cane - single point;Wheelchair - manual      Prior Function Level of Independence: Independent               Hand Dominance   Dominant Hand: Right    Extremity/Trunk Assessment   Upper Extremity Assessment: Defer to OT evaluation           Lower Extremity Assessment: Generalized weakness         Communication   Communication: HOH  Cognition Arousal/Alertness: Awake/alert Behavior During Therapy: WFL for tasks assessed/performed Overall Cognitive Status: No family/caregiver present to determine baseline cognitive functioning                      General Comments      Exercises        Assessment/Plan  PT Assessment Patient needs continued PT services  PT Diagnosis Difficulty walking;Abnormality of gait;Generalized weakness   PT Problem List Decreased strength;Decreased activity tolerance;Decreased balance;Decreased mobility;Decreased coordination;Decreased safety awareness  PT Treatment  Interventions DME instruction;Gait training;Stair training;Functional mobility training;Therapeutic activities;Therapeutic exercise;Balance training;Patient/family education   PT Goals (Current goals can be found in the Care Plan section) Acute Rehab PT Goals Patient Stated Goal: to go home PT Goal Formulation: With patient Time For Goal Achievement: 03/05/15 Potential to Achieve Goals: Good    Frequency Min 3X/week   Barriers to discharge        Co-evaluation               End of Session Equipment Utilized During Treatment: Gait belt Activity Tolerance: Patient tolerated treatment well Patient left: in chair;with call bell/phone within reach Nurse Communication: Mobility status         Time: 0952-1010 PT Time Calculation (min) (ACUTE ONLY): 18 min   Charges:   PT Evaluation $Initial PT Evaluation Tier I: 1 Procedure     PT G CodesDuncan Dull 17-Mar-2015, 12:09 PM Alben Deeds, [place] DPT  [phone]

## 2015-02-19 NOTE — Progress Notes (Signed)
Initial Nutrition Assessment  DOCUMENTATION CODES:   Not applicable  INTERVENTION:    Ensure Enlive PO BID, each supplement provides 350 kcal and 20 grams of protein  Boost Plus PO once daily, each supplement provides 360 kcal and 14 gm protein  NUTRITION DIAGNOSIS:   Inadequate oral intake related to other (see comment) (advanced age) as evidenced by per patient/family report.  GOAL:   Patient will meet greater than or equal to 90% of their needs  MONITOR:   PO intake, Supplement acceptance, Diet advancement, Labs, Weight trends  REASON FOR ASSESSMENT:   Consult Assessment of nutrition requirement/status  ASSESSMENT:   81 y.o. male who presents for follow up and now episodes of extreme fatigue.   S/P cardiac cath on 7/26, stable coronary status per Cardiology note.   Labs reviewed: sodium low, potassium elevated  Spoke with patient, his wife, and their son who is a Engineer, drilling. Son requested that RD provide an amount of fluid, calories, and protein that patient needs to consume per day. Discussed increasing intake of calories and protein. He came in dehydrated. Weight down some due to dehydration; no significant weight loss noted. Nutrition-Focused physical exam completed. Findings are no fat depletion and mild muscle depletion. Patient is interested in trying Boost and Ensure supplements, discussed options with patient and his family. Patient was surprised to know how much fluid he needs to drink per day.   Diet Order:  Diet Heart Room service appropriate?: Yes; Fluid consistency:: Thin  Skin:  Reviewed, no issues  Last BM:  7/22  Height:   Ht Readings from Last 1 Encounters:  02/17/15 5\' 10"  (1.778 m)    Weight:   Wt Readings from Last 1 Encounters:  02/19/15 157 lb 9.6 oz (71.487 kg)    Ideal Body Weight:  75.5 kg  BMI:  Body mass index is 22.61 kg/(m^2).  Estimated Nutritional Needs:   Kcal:  1800-2000  Protein:  80-90 gm  Fluid:  1.8-2  L  EDUCATION NEEDS:   Education needs addressed   Molli Barrows, [place], [place], [place] Pager [phone] After Hours Pager [phone]

## 2015-02-19 NOTE — Progress Notes (Signed)
Sent page to Cards Fellow about drop in Hgb from 12.3 to 9.7 in last 36 hours. Cath sites clean and dry, pt VSS and no complaints

## 2015-02-19 NOTE — Discharge Instructions (Signed)

## 2015-02-19 NOTE — Discharge Summary (Signed)
Discharge Summary   Patient ID: Kurt Elliott,  MRN: [id number], DOB/AGE: January 20, 1921 81 y.o.  Admit date: 02/17/2015 Discharge date: 02/19/2015  Primary Care Provider: Renato Shin Primary Cardiologist: Veatrice Bourbon, MD   Discharge Diagnoses Principal Problem:   Unstable angina  **S/P catheterization revealing patent LAD, LCX, OM, and RCA stents. Active Problems:   CAD (coronary artery disease)   Physical deconditioning   Essential hypertension   Aortic stenosis   Exertional shortness of breath   CKD (chronic kidney disease) stage 3, GFR 30-59 ml/min   Insomnia   Fatigue   Sinus tachycardia   Dietary deficiency   Mitral regurgitation   Anemia   Allergies Allergies  Allergen Reactions  . Isosorbide Other (See Comments)    "severe Headache" per patient  . Lisinopril Cough  . Ticlopidine Hcl Rash    Procedures  2D Echocardiogram 7.26.2016  Study Conclusions  - Left ventricle: There is an intracavitary gradient of 60mm/Hg.   The cavity size was normal. Wall thickness was increased in a   pattern of moderate LVH. Systolic function was vigorous. The   estimated ejection fraction was in the range of 65% to 70%. Wall   motion was normal; there were no regional wall motion   abnormalities. - Aortic valve: Thickened aortic valve. Opening is seen. The mean   and peak gradients are only mildly elevated. However, the LV   cavity size is small with vigorous motion. Also, there is an   intracavitary gradient of 21mmHg. There is no mitral SAM. There   is no LVOT gradient. The patient will be cathed today. All   hemodynamics will be obtained to rule out the slim chance of   NORMAL EF/LOW GRADIENT/SEVERE AS. However Stroke volume index of   20ml/M2 in echo lab seems too high for this diagnosis. Mean   gradient (S): 12 mm Hg. Peak gradient (S): 20 mm Hg. - Left atrium: The atrium was mildly dilated. - Right ventricle: The cavity size was mildly dilated. Systolic   function was  normal. _____________  Cardiac Catheterization 7.26.2016  Coronary Findings     Dominance: Right    Left Main  The vessel was injected is large . TIG 4.0 catheter   . LM lesion, 35% stenosed. discrete located at the major branch .      Left Anterior Descending   . Ost LAD lesion, 35% stenosed.   . Prox LAD to Dist LAD lesion, 0% stenosed. located at the major branch . Previously placed Prox LAD to Dist LAD bare metal and drug eluting stents are patent. Bare-metal stent to mid LAD 15 years ago, Promus Premier 2.5 mm x 32 mm (20 mm)   . First Diagonal Branch   The vessel is small in size.   . First Septal Branch   The vessel is moderate in size.   Marland Kitchen Second Diagonal Branch   The vessel is small in size.   Colon Flattery 2nd Diag to 2nd Diag lesion, 65% stenosed. discrete located at the major branch .   Marland Kitchen Second Septal Branch   The vessel is small in size.   . Third Septal Branch   The vessel is small in size.      Ramus Intermedius  The vessel is small .      Left Circumflex   . Ost Cx to Prox Cx lesion, 40% stenosed. discrete located at the major branch .   . Prox Cx lesion, 0% stenosed. Previously placed Prox Cx drug  eluting stent is patent. Stent extends across AV groove circumflex into OM   . First Obtuse Marginal Branch   . Ost 1st Mrg to 1st Mrg lesion, 0% stenosed. Previously placed Ost 1st Mrg to 1st Mrg drug eluting stent is patent. Promus Premier 3.0 x 12 mm      Right Coronary Artery  The vessel was injected is large . TIG 4.0 catheter There is moderate diffuse disease throughout the vessel.   Colon Flattery RCA to Prox RCA lesion, 30% stenosed. diffuse . The lesion was previously treated with a bare metal stent . Over 15 years ago   . Right Posterior Descending Artery   The vessel is moderate in size.   . Inferior Septal   The vessel is small in size.   . Right Posterior Atrioventricular Branch   The vessel is moderate in size.   . First Right Posterolateral   The vessel is  moderate in size.   . Third Right Posterolateral   The vessel is small in size.         Right Heart Pressures LV EDP is normal.     Wall Motion     Hyperdynamic           Left Heart     Left Ventricle Cavitary volume appears diminished There is hyperdynamic left ventricular systolic function. The left ventricular ejection fraction is greater tha 65% by visual estimate. There are no wall motion abnormalities in the left ventricle.    Mitral Valve There is no mitral valve stenosis and moderate (3+) mitral regurgitation. The annulus is calcified.    Aortic Valve There is mild aortic valve stenosis, and no aortic valve regurgitation. Peak gradient 10-15 mmHg Intracavitary gradient of 10-15 mmHg The aortic valve is calcified. Restricted by echo   Right Heart Hemo Data       Most Recent Value   Fick Cardiac Output  10.05 L/min   Fick Cardiac Output Index  5.43 (L/min)/BSA   Thermal Cardiac Output  6.67 L/min   Thermal Cardiac Output Index  3.61 (L/min)/BSA   Aortic Mean Gradient  12.4 mmHg   Aortic Peak Gradient  17 mmHg   Aortic Valve Area  2.00   Aortic Value Area Index  1.08 cm2/BSA   RA A Wave  2 mmHg   RA V Wave  0 mmHg   RA Mean  -1 mmHg   RV Systolic Pressure  30 mmHg   RV Diastolic Pressure  -3 mmHg   RV EDP  0 mmHg   PA Systolic Pressure  25 mmHg   PA Diastolic Pressure  0 mmHg   PA Mean  6 mmHg   PW A Wave  4 mmHg   PW V Wave  2 mmHg   PW Mean  0 mmHg   AO Systolic Pressure  867 mmHg   AO Diastolic Pressure  44 mmHg   AO Mean  69 mmHg   LV Systolic Pressure  619 mmHg   LV Diastolic Pressure  0 mmHg   LV EDP  4 mmHg   Arterial Occlusion Pressure Extended Systolic Pressure  509 mmHg   Arterial Occlusion Pressure Extended Diastolic Pressure  42 mmHg   Arterial Occlusion Pressure Extended Mean Pressure  66 mmHg   Left Ventricular Apex Extended Systolic Pressure  326 mmHg   Left Ventricular Apex Extended Diastolic Pressure  0  mmHg   Left Ventricular Apex Extended EDP Pressure  5 mmHg   TPVR Index  1.66 HRUI  TSVR Index  19.14 HRUI   TPVR/TSVR Ratio  0.09  _____________   History of Present Illness  81 year old male with a prior history of coronary artery disease status post prior right coronary artery and obtuse marginal stenting with subsequent stenting of the left circumflex and LAD in March of this year. He was seen in clinic on July 25 with complaints of dyspnea and epigastric chest discomfort. He was admitted for further evaluation.  Hospital Course  Patient ruled out for myocardial infarction. His creatinine was at baseline and he was felt to be a stable candidate for diagnostic catheterization. This was performed on July 26 revealing stable anatomy with moderate, diffuse coronary artery disease but also with patent stents in the LAD, circumflex, obtuse marginal, and right coronary artery. LV function was normal. Right heart catheterization was performed and revealed low filling pressures with a right atrial pressure of 2 over 0 and a PCWP of 0. Home dose of ARB therapy was discontinued and he was hydrated postcatheterization. He has not had any recurrence of chest pain. He's been seen by physical and occupational therapy with recommendation for 24-hour supervision as well as home health PT, OT, and a Rollator. After discussion with patient and family, he will be discharged home today in good condition. We have arranged for early follow-up on August 8.  Discharge Vitals Blood pressure 125/68, pulse 93, temperature 98 F (36.7 C), temperature source Oral, resp. rate 16, height 5\' 10"  (1.778 m), weight 157 lb 9.6 oz (71.487 kg), SpO2 98 %.  Filed Weights   02/17/15 1545 02/18/15 0428 02/19/15 0458  Weight: 150 lb 14.4 oz (68.448 kg) 150 lb 11.2 oz (68.357 kg) 157 lb 9.6 oz (71.487 kg)    Labs  CBC  Recent Labs  02/19/15 0429 02/19/15 1315  WBC 5.4 5.6  HGB 9.7* 10.5*  HCT 26.8* 29.0*  MCV 91.2  92.1  PLT 120* 092*   Basic Metabolic Panel  Recent Labs  02/18/15 1436 02/19/15 1112  NA 135 133*  K 4.4 5.4*  CL 109 109  CO2 18* 20*  GLUCOSE 133* 103*  BUN 28* 20  CREATININE 1.25* 1.20  CALCIUM 8.8* 8.8*   Cardiac Enzymes  Recent Labs  02/17/15 1723 02/17/15 2244 02/18/15 0416  TROPONINI <0.03 <0.03 <0.03   Fasting Lipid Panel  Recent Labs  02/18/15 0416  CHOL 149  HDL 43  LDLCALC 96  TRIG 51  CHOLHDL 3.5   Disposition  Pt is being discharged home today in good condition.  Follow-up Plans & Appointments      Follow-up Information    Follow up with Renato Shin, MD.   Specialty:  Endocrinology   Why:  as scheduled   Contact information:   [address] [place] [address] [phone]       Follow up with Truitt Merle, NP On 03/03/2015.   Specialties:  Nurse Practitioner, Interventional Cardiology, Cardiology, Radiology   Why:  1:30 PM - Dr. Ron Parker' Nurse Practitioner   Contact information:   [place] [place] [address] [phone]      Discharge Medications    Medication List    STOP taking these medications        amLODipine 5 MG tablet  Commonly known as:  NORVASC     olmesartan 5 MG tablet  Commonly known as:  BENICAR      TAKE these medications        aspirin EC 81 MG  tablet  Take 81 mg by mouth daily.     clopidogrel 75 MG tablet  Commonly known as:  PLAVIX  Take 1 tablet (75 mg total) by mouth daily with breakfast.     diazepam 5 MG tablet  Commonly known as:  VALIUM  Take 2.5 mg by mouth every 6 (six) hours as needed (vertigo).     LUPRON DEPOT IM  Inject into the muscle as directed. Done at [facility] office approx once every 3 months     methimazole 5 MG tablet  Commonly known as:  TAPAZOLE  TAKE 1 TABLET BY MOUTH 3 TIMES A WEEK     metoprolol tartrate 25 MG tablet  Commonly known as:  LOPRESSOR  Take 0.5 tablets (12.5 mg total) by mouth 2 (two) times daily.      MICRONIZED COLESTIPOL HCL 1 G tablet  Generic drug:  colestipol  TAKE 5 TABLETS BY MOUTH EVERY DAY AS DIRECTED     nitroGLYCERIN 0.4 MG SL tablet  Commonly known as:  NITROSTAT  Place 0.4 mg under the tongue every 5 (five) minutes as needed for chest pain.     pantoprazole 40 MG tablet  Commonly known as:  PROTONIX  TAKE 1 TABLET (40 MG TOTAL) BY MOUTH DAILY.     triamcinolone cream 0.1 %  Commonly known as:  KENALOG  Apply 1 application topically daily.     zaleplon 5 MG capsule  Commonly known as:  SONATA  Take 1 capsule (5 mg total) by mouth at bedtime as needed for sleep.       Outstanding Labs/Studies  None.  Duration of Discharge Encounter   Greater than 30 minutes including physician time.  Signed, Murray Hodgkins NP 02/19/2015, 4:23 PM Patient seen and examined. I agree with the assessment and plan as detailed above. See also my additional thoughts below.   I spoke with the patient's son-in-law by telephone. The patient was feeling better after all his consultations and was ready to go home. He is improved. I made the decision for discharge. I agree with the note above and the plans.  Dola Argyle, MD, [facility] 02/20/2015 9:59 AM

## 2015-02-19 NOTE — Progress Notes (Addendum)
SUBJECTIVE:  Patient is stable. He feels that the sleep medication that he has been receiving is too strong. He does take Sonata 5 mg as needed at home. He tolerates this well. I will last pharmacy to arrange for him to have it even if it includes bringing his home medication.  Cardiac catheterization was done yesterday. His coronary status is stable. Hemodynamics revealed that he was very dry even though he had received IV fluids. His aortic stenosis is definitely not significant. There is mention in the cath report of at least 2+ mitral regurgitation. I cannot explain this because echo done the same day showed no significant mitral regurgitation. I spoke with the patient's family at length yesterday.  The patient ambulated in the hall with pulse oximetry yesterday. On room air his O2 sat was 97% or higher.   Filed Vitals:   02/18/15 1604 02/18/15 1700 02/18/15 2055 02/19/15 0458  BP: 121/86  126/39 125/68  Pulse: 85  91 93  Temp:  97.9 F (36.6 C) 98.4 F (36.9 C) 98 F (36.7 C)  TempSrc:  Oral Oral Oral  Resp: 13  16   Height:      Weight:    157 lb 9.6 oz (71.487 kg)  SpO2: 97%  97% 98%     Intake/Output Summary (Last 24 hours) at 02/19/15 0933 Last data filed at 02/19/15 0501  Gross per 24 hour  Intake    480 ml  Output    780 ml  Net   -300 ml    LABS: Basic Metabolic Panel:  Recent Labs  02/18/15 0416 02/18/15 1436  NA 132* 135  K 4.8 4.4  CL 105 109  CO2 22 18*  GLUCOSE 105* 133*  BUN 32* 28*  CREATININE 1.52* 1.25*  CALCIUM 8.7* 8.8*   Liver Function Tests: No results for input(s): AST, ALT, ALKPHOS, BILITOT, PROT, ALBUMIN in the last 72 hours. No results for input(s): LIPASE, AMYLASE in the last 72 hours. CBC:  Recent Labs  02/18/15 0416 02/19/15 0429  WBC 5.5 5.4  HGB 10.0* 9.7*  HCT 28.2* 26.8*  MCV 91.3 91.2  PLT 124* 120*   Cardiac Enzymes:  Recent Labs  02/17/15 1723 02/17/15 2244 02/18/15 0416  TROPONINI <0.03 <0.03 <0.03    BNP: Invalid input(s): POCBNP D-Dimer: No results for input(s): DDIMER in the last 72 hours. Hemoglobin A1C: No results for input(s): HGBA1C in the last 72 hours. Fasting Lipid Panel:  Recent Labs  02/18/15 0416  CHOL 149  HDL 43  LDLCALC 96  TRIG 51  CHOLHDL 3.5   Thyroid Function Tests: No results for input(s): TSH, T4TOTAL, T3FREE, THYROIDAB in the last 72 hours.  Invalid input(s): FREET3  RADIOLOGY: Dg Chest 1 View  02/18/2015   CLINICAL DATA:  Shortness of breath.  EXAM: CHEST  1 VIEW  COMPARISON:  10/12/2007 and chest CT dated 02/13/2008.  FINDINGS: Poor inspiration. Borderline enlarged cardiac silhouette and prominent pulmonary vasculature and interstitial markings. No pleural fluid. Thoracic spine degenerative changes. Mild bilateral shoulder degenerative changes.  IMPRESSION: Poor inspiration with borderline cardiomegaly and mild changes of congestive heart failure.   Electronically Signed   By: Claudie Revering M.D.   On: 02/18/2015 13:33    PHYSICAL EXAM  the patient is oriented to person time and place. He is still waking up from his sleeping med. He has significant kyphosis of the spine. Head is atraumatic. Sclera and conjunctiva are normal. There is no jugulovenous distention. Lungs are clear.  Respiratory effort is not labored. Cardiac exam reveals S1 and S2. There is a systolic murmur. The abdomen is soft. There is no peripheral edema.   TELEMETRY:  I have reviewed telemetry today February 19, 2015. There is sinus rhythm. At times with movement his heart rate increases to 90. There are no arrhythmias noted.   ASSESSMENT AND PLAN:    Essential hypertension    His blood pressure is stable. No change in therapy.    CAD (coronary artery disease)      Catheterization yesterday showed that his coronary disease is stable. He does have a jailed diagonal. It is possible he could have some chest pain at times. However this would not represent an unstable situation requiring  further intervention.    Ejection fraction     The ejection fraction by echo yesterday was high. He had an intracavitary gradient of 45 mmHg. Some of this reflected his low volume status. He needs to be hydrated.     Aortic stenosis     We have carefully assess his aortic stenosis. He does not have hemodynamically significant aortic stenosis.    Fatigue     One of the overriding issues was his unexplained episodes of fatigue. It does not represent cardiac ischemia. It does not appear to represent hypoxia. So far there are no proven arrhythmias. More likely it is his overall status. I feel that his diet is inadequate at as he does not want to eat much.    Exertional shortness of breath     It has been difficult to know if his description of shortness of breath is actually shortness of breath or just marked fatigue. So far there appears to be no cardiac or pulmonary basis for exertional shortness of breath. He had a chest x-ray yesterday. There was question by x-ray of mild CHF. This does not fit with the catheterization findings of extremely low right atrial and left ventricular filling pressures.    Sinus tachycardia     I feel that an intermittent sinus tachycardia may be due to his decreased volume status at times.    CKD (chronic kidney disease) stage 3, GFR 30-59 ml/min     Chemistry lab is not yet available for today. It is being drawn.    CAD S/P percutaneous coronary angioplasty    Insomnia     The patient takes Sonata at home for sleep. He is receiving Desyrel here in the hospital that is too strong for him. I've spoken with the pharmacy to obtain his medicine either through the pharmacy or from his home so that he can take it here in the hospital.    Physical deconditioning    Patient will be evaluated by OT and PT today to help with further assessment and recommendations.    Dietary deficiency     We are looking for dietary consult to help with plans for adequate nutrition and  adequate volume intake. He tends to not eat or drink enough at home.    Mitral regurgitation ?    There is question in the Cath Lab of mitral regurgitation. There was no MR seen by echo just prior to his catheterization. No further workup.    Anemia The patient's hemoglobin has been in the range of 10. It was 9.7 as morning. There is no evidence of bleeding. We will continue to follow his hemoglobin.     At this point he is not ready to go home. The plan will be discharged to  home with his wife.     Dola Argyle 02/19/2015 9:33 AM

## 2015-02-19 NOTE — Progress Notes (Signed)
Pt states the medication he took for sleep last night was "overpowering" and does not wish to receive any more. Etta Quill, RN

## 2015-02-20 ENCOUNTER — Other Ambulatory Visit: Payer: Self-pay | Admitting: Cardiology

## 2015-02-20 NOTE — Care Management Note (Signed)
Case Management Note  Patient Details  Name: EHAB HUMBER MRN: [id number] Date of Birth: 08-04-1920  Subjective/Objective:  Referral for [place].                  Action/Plan: Pt chose [facility]. CM did make referral and SOC to begin within 24-48 hrs post d/c.   Expected Discharge Date:                  Expected Discharge Plan:  [place]  In-House Referral:  NA  Discharge planning Services  CM Consult  Post Acute Care Choice:  Home Health Choice offered to:  Patient  DME Arranged:  N/A DME Agency:  NA  HH Arranged:  PT, OT HH Agency:  [facility]  Status of Service:  Completed, signed off  Medicare Important Message Given:    Date Medicare IM Given:    Medicare IM give by:    Date Additional Medicare IM Given:    Additional Medicare Important Message give by:     If discussed at [place] of Stay Meetings, dates discussed:    Additional Comments:  Bethena Roys, RN 02/20/2015, 8:55 AM

## 2015-02-20 NOTE — Telephone Encounter (Signed)
Erroneous encounter

## 2015-02-24 ENCOUNTER — Telehealth: Payer: Self-pay | Admitting: Endocrinology

## 2015-02-24 ENCOUNTER — Telehealth: Payer: Self-pay | Admitting: Cardiology

## 2015-02-24 DIAGNOSIS — H6193 Disorder of external ear, unspecified, bilateral: Secondary | ICD-10-CM | POA: Diagnosis not present

## 2015-02-24 DIAGNOSIS — H6122 Impacted cerumen, left ear: Secondary | ICD-10-CM | POA: Diagnosis not present

## 2015-02-24 DIAGNOSIS — H6092 Unspecified otitis externa, left ear: Secondary | ICD-10-CM | POA: Diagnosis not present

## 2015-02-24 NOTE — Telephone Encounter (Signed)
**Note De-identified  Obfuscation** LMTCB

## 2015-02-24 NOTE — Telephone Encounter (Addendum)
**Note De-Identified  Obfuscation** The pts daughter, [name], states that Dr Ron Parker asked her to let him know if the pt develops any difficulties. She is reporting that starting yesterday she noticed that the pt becomes SOB while eating and that it took about 15 minutes for his breathing to normalize. She states that she also noticed that he was SOB after a bowel movement yesterday. She states that she asked him if he was having problems with his breathing and he stated "only when I exert myself". She reports that he walks around the house without any help. He has no swelling and [name] states that she is unaware of any weight gain but that they do not weigh him daily. She is advised that I am sending this message to Dr Ron Parker for his recommendations. She is aware that Dr Ron Parker is on vacation this week and is requesting that if I do not hear back from Dr Ron Parker today to talk with our DOD.

## 2015-02-24 NOTE — Telephone Encounter (Signed)
**Note De-Identified  Obfuscation** Per Dr Ron Parker I called Kurt Elliott back and ask her if the pt has SOB when he lays down and she stated no. I gave reassurance and advised Kurt Elliott that since the pt is getting around well by himself and without assistance and he has no swelling or weight gain that his SOB is not heart related. She is advised to call back if he develops any worsening s/s. She verbalized understanding and is in agreement with plan.

## 2015-02-24 NOTE — Telephone Encounter (Signed)
error 

## 2015-02-24 NOTE — Telephone Encounter (Signed)
Pt's dtr calling re pt having SOB while he eats and with exertion starting yesterday pt s/p cath 1 week ago-pls advise

## 2015-02-27 ENCOUNTER — Telehealth: Payer: Self-pay | Admitting: Cardiology

## 2015-02-27 ENCOUNTER — Telehealth: Payer: Self-pay | Admitting: Endocrinology

## 2015-02-27 DIAGNOSIS — R42 Dizziness and giddiness: Secondary | ICD-10-CM | POA: Diagnosis not present

## 2015-02-27 DIAGNOSIS — I25110 Atherosclerotic heart disease of native coronary artery with unstable angina pectoris: Secondary | ICD-10-CM | POA: Diagnosis not present

## 2015-02-27 DIAGNOSIS — I129 Hypertensive chronic kidney disease with stage 1 through stage 4 chronic kidney disease, or unspecified chronic kidney disease: Secondary | ICD-10-CM | POA: Diagnosis not present

## 2015-02-27 DIAGNOSIS — I350 Nonrheumatic aortic (valve) stenosis: Secondary | ICD-10-CM | POA: Diagnosis not present

## 2015-02-27 DIAGNOSIS — N183 Chronic kidney disease, stage 3 (moderate): Secondary | ICD-10-CM | POA: Diagnosis not present

## 2015-02-27 DIAGNOSIS — M4800 Spinal stenosis, site unspecified: Secondary | ICD-10-CM | POA: Diagnosis not present

## 2015-02-27 NOTE — Telephone Encounter (Signed)
New message     FYI Pt has delayed care until today.  Kurt Elliott is doing a starter care visit today.

## 2015-02-27 NOTE — Telephone Encounter (Signed)
Kurt Elliott is advised that Dr Ron Parker is not in the office at this time and will not return until week after next. She is advised that I am sending this message to Dr Ron Parker and that I will call her back with his orders when available. Kurt Elliott states that in the meantime she will contact the pts PCP for orders.

## 2015-02-27 NOTE — Telephone Encounter (Signed)
New message     Pt has been opened to home health care Nurse needs a verbal order for skilled nursing(medication management & disease management) Also needs order for physical therapy and occupational therapy

## 2015-02-27 NOTE — Telephone Encounter (Signed)
See note below and please advise, Thanks! 

## 2015-02-27 NOTE — Telephone Encounter (Signed)
Arbie Cookey states that she just wants Dr Ron Parker to know that they were delayed in starting the pts PT and OT but that they are going to the pts home today for 1st visit. Will forward message to Dr Ron Parker as Juluis Rainier.

## 2015-02-27 NOTE — Telephone Encounter (Signed)
[facility] needs VO for the ok of them to go to his house 1 wk 1, 2 wk 2, 1 wk 3 for disease and med management [place]

## 2015-02-28 NOTE — Telephone Encounter (Signed)
We really don't need these services.  If there is any other need, please let me know.

## 2015-03-03 ENCOUNTER — Ambulatory Visit (INDEPENDENT_AMBULATORY_CARE_PROVIDER_SITE_OTHER): Payer: Medicare Other | Admitting: Nurse Practitioner

## 2015-03-03 ENCOUNTER — Encounter: Payer: Self-pay | Admitting: Nurse Practitioner

## 2015-03-03 VITALS — BP 120/58 | HR 65 | Ht 70.5 in | Wt 160.4 lb

## 2015-03-03 DIAGNOSIS — I2510 Atherosclerotic heart disease of native coronary artery without angina pectoris: Secondary | ICD-10-CM | POA: Diagnosis not present

## 2015-03-03 DIAGNOSIS — R42 Dizziness and giddiness: Secondary | ICD-10-CM | POA: Diagnosis not present

## 2015-03-03 DIAGNOSIS — I779 Disorder of arteries and arterioles, unspecified: Secondary | ICD-10-CM

## 2015-03-03 DIAGNOSIS — I739 Peripheral vascular disease, unspecified: Principal | ICD-10-CM

## 2015-03-03 DIAGNOSIS — I208 Other forms of angina pectoris: Secondary | ICD-10-CM

## 2015-03-03 LAB — CBC
HCT: 29.6 % — ABNORMAL LOW (ref 39.0–52.0)
Hemoglobin: 10.2 g/dL — ABNORMAL LOW (ref 13.0–17.0)
MCHC: 34.5 g/dL (ref 30.0–36.0)
MCV: 95 fl (ref 78.0–100.0)
Platelets: 189 10*3/uL (ref 150.0–400.0)
RBC: 3.12 Mil/uL — ABNORMAL LOW (ref 4.22–5.81)
RDW: 13.6 % (ref 11.5–15.5)
WBC: 6.1 10*3/uL (ref 4.0–10.5)

## 2015-03-03 LAB — BASIC METABOLIC PANEL
BUN: 30 mg/dL — ABNORMAL HIGH (ref 6–23)
CO2: 24 mEq/L (ref 19–32)
Calcium: 9.4 mg/dL (ref 8.4–10.5)
Chloride: 106 mEq/L (ref 96–112)
Creatinine, Ser: 1.21 mg/dL (ref 0.40–1.50)
GFR: 59.56 mL/min — ABNORMAL LOW (ref >60.00)
Glucose, Bld: 107 mg/dL — ABNORMAL HIGH (ref 70–99)
Potassium: 4.4 mEq/L (ref 3.5–5.1)
Sodium: 137 mEq/L (ref 135–145)

## 2015-03-03 NOTE — Addendum Note (Signed)
Addended by: Burtis Junes on: 03/03/2015 02:08 PM   Modules accepted: Orders

## 2015-03-03 NOTE — Progress Notes (Addendum)
CARDIOLOGY OFFICE NOTE  Date:  03/03/2015    Kurt Elliott Date of Birth: 06-21-1921 Medical Record #[id number]  PCP:  Renato Shin, MD  Cardiologist:  Ron Parker    Chief Complaint  Patient presents with  . Post PCI visit    Seen for Dr. Ron Parker    History of Present Illness: Kurt Elliott is a 81 y.o. male who presents today for a post hospital visit. Seen for Dr. Ron Parker. He has a prior history of coronary artery disease status post prior right coronary artery and obtuse marginal stenting with subsequent stenting of the left circumflex and LAD in March of 2016.   He was seen in clinic on July 25 with complaints of dyspnea and epigastric chest discomfort. He was admitted for further evaluation.  Patient ruled out for myocardial infarction. His creatinine was at baseline and he was felt to be a stable candidate for diagnostic catheterization. This was performed on July 26 revealing stable anatomy with moderate, diffuse coronary artery disease but also with patent stents in the LAD, circumflex, obtuse marginal, and right coronary artery. LV function was normal. Right heart catheterization was performed and revealed low filling pressures with a right atrial pressure of 2 over 0 and a PCWP of 0. Home dose of ARB therapy was discontinued and he was hydrated postcatheterization.  Comes in today. Here with his son. Doing well. No chest pain. Energy level "a lot better". He remains off of his Benicar. No problems noted. He is suppose to go to the [facility] tomorrow for labs - says he needs a PSA. ?TSH. He does not really know and son does not know why either. Just had these done in the hospital - I would not think we need to do this. He wants to go back to driving short distances. Not dizzy. No syncope. No chest pain and breathing is good.   Past Medical History  Diagnosis Date  . Hypertension   . Hyperlipemia   . Unspecified disorder of liver   . Cellulitis, leg     left  . Pulmonary  nodule, right     2008 / unchanged CT scan, July, 2009  . Abdominal pain, left lower quadrant   . Adenocarcinoma of prostate   . Esophageal stricture   . Spinal stenosis   . Hyperglycemia   . Hyperthyroidism   . GERD (gastroesophageal reflux disease)   . Hx of colonic polyps   . Renal artery stenosis     Mild  . Atheroma     no penetrating ulcer, Abdominal aorta  . Inferior mesenteric artery injury     High-grade stenosis, CT scan, 2006  . Vertigo   . Diverticulum   . Incisional hernia   . Rash     Ticlid  . Cough     ACE Inhibitor, tolerates ARB  . Carotid artery disease     Doppler, 2006, minimal disease  //  Doppler, May, 2012, mild increase velocities, 40-59% bilateral stenoses  . Renal cyst     Hyperdense cyst lower pole left kidney  . Dizziness   . Unsteady gait   . Sinus bradycardia   . CAD (coronary artery disease), native coronary artery     a. cath 2008  b. cath 10/21/2014 3v dzs w/ patent RCA stent, severe stenosis of mid LCx/OM1 s/p DES, moderately severe diffuse mid LAD treated with DES, patent prox LAD stent;  c. 01/2015 Cath: patent LAD, LCX, OM, and RCA stents->Med Rx.  Marland Kitchen  Mild aortic stenosis     a. 09/2014 Echo: EF 65-70%, Gr 1 DD, no rwma, mild AS;  b. 01/2015 Echo: EF 65-70%, mod LVH, no rwma, 4mmHg intracavitary gradient, mildly dil LA.  Marland Kitchen Shortness of breath dyspnea     Past Surgical History  Procedure Laterality Date  . Esophagogastroduodenoscopy    . Cardiac catheterization    . Incisional hernia repair    . Left heart catheterization with coronary angiogram N/A 10/21/2014    Procedure: LEFT HEART CATHETERIZATION WITH CORONARY ANGIOGRAM;  Surgeon: Sherren Mocha, MD;  Location: [facility] CATH LAB;  Service: Cardiovascular;  Laterality: N/A;  . Percutaneous coronary stent intervention (pci-s)  10/21/2014    Procedure: PERCUTANEOUS CORONARY STENT INTERVENTION (PCI-S);  Surgeon: Sherren Mocha, MD;  Location: [facility] CATH LAB;  Service: Cardiovascular;;  Mid LAD and  OM1  . Sigmoid resection / rectopexy    . Cardiac catheterization N/A 02/18/2015    Procedure: Right/Left Heart Cath and Coronary Angiography;  Surgeon: Leonie Man, MD;  Location: [facility] CV LAB;  Service: Cardiovascular;  Laterality: N/A;     Medications: Current Outpatient Prescriptions  Medication Sig Dispense Refill  . aspirin EC 81 MG tablet Take 81 mg by mouth daily.    . clopidogrel (PLAVIX) 75 MG tablet Take 1 tablet (75 mg total) by mouth daily with breakfast. 90 tablet 3  . diazepam (VALIUM) 5 MG tablet Take 2.5 mg by mouth every 6 (six) hours as needed (vertigo).    Marland Kitchen Leuprolide Acetate (LUPRON DEPOT IM) Inject into the muscle as directed. Done at [place] office approx once every 3 months    . methimazole (TAPAZOLE) 5 MG tablet TAKE 1 TABLET BY MOUTH 3 TIMES A WEEK (MON, WED, AND FRI) 15 tablet 0  . metoprolol tartrate (LOPRESSOR) 25 MG tablet Take 0.5 tablets (12.5 mg total) by mouth 2 (two) times daily. 30 tablet 6  . MICRONIZED COLESTIPOL HCL 1 G tablet TAKE 5 TABLETS BY MOUTH EVERY DAY AS DIRECTED (Patient taking differently: TAKE 2 G BY MOUTH DAILY IN THE AFTERNOON) 450 tablet 3  . nitroGLYCERIN (NITROSTAT) 0.4 MG SL tablet Place 0.4 mg under the tongue every 5 (five) minutes as needed for chest pain.    . pantoprazole (PROTONIX) 40 MG tablet TAKE 1 TABLET (40 MG TOTAL) BY MOUTH DAILY. 90 tablet 3  . triamcinolone cream (KENALOG) 0.1 % Apply 1 application topically daily.   3  . zaleplon (SONATA) 5 MG capsule Take 1 capsule (5 mg total) by mouth at bedtime as needed for sleep. 30 capsule 5   No current facility-administered medications for this visit.    Allergies: Allergies  Allergen Reactions  . Isosorbide Other (See Comments)    "severe Headache" per patient  . Lisinopril Cough  . Ticlopidine Hcl Rash    Social History: The patient  reports that he has never smoked. He has never used smokeless tobacco. He reports that he does not drink alcohol or use  illicit drugs.   Family History: The patient's family history includes Breast cancer in his sister; Heart attack in his father; Hypertension in his brother, father, mother, and sister; Pemphigus vulgaris in his mother; Stroke in his father.   Review of Systems: Please see the history of present illness.   Otherwise, the review of systems is positive for none.   All other systems are reviewed and negative.   Physical Exam: VS:  BP 120/58 mmHg  Pulse 65  Ht 5' 10.5" (1.791 m)  Wt  160 lb 6.4 oz (72.757 kg)  BMI 22.68 kg/m2  SpO2 99% .  BMI Body mass index is 22.68 kg/(m^2).  Wt Readings from Last 3 Encounters:  03/03/15 160 lb 6.4 oz (72.757 kg)  02/19/15 157 lb 9.6 oz (71.487 kg)  02/17/15 154 lb (69.854 kg)    General: Pleasant. Elderly male - little frail - but in no acute distress.  HEENT: Normal. Neck: Supple, no JVD, carotid bruits, or masses noted.  Cardiac: Regular rate and rhythm. No murmurs, rubs, or gallops. No edema.  Respiratory:  Lungs are clear to auscultation bilaterally with normal work of breathing.  GI: Soft and nontender.  MS: No deformity or atrophy. Gait and ROM intact. Skin: Warm and dry. Color is normal.  Neuro:  Strength and sensation are intact and no gross focal deficits noted.  Psych: Alert, appropriate and with normal affect.   LABORATORY DATA:  EKG:  EKG is not ordered today.   Lab Results  Component Value Date   WBC 5.6 02/19/2015   HGB 10.5* 02/19/2015   HCT 29.0* 02/19/2015   PLT 134* 02/19/2015   GLUCOSE 103* 02/19/2015   CHOL 149 02/18/2015   TRIG 51 02/18/2015   HDL 43 02/18/2015   LDLDIRECT 97.5 06/01/2011   LDLCALC 96 02/18/2015   ALT 26 02/05/2015   AST 42* 02/05/2015   NA 133* 02/19/2015   K 5.4* 02/19/2015   CL 109 02/19/2015   CREATININE 1.20 02/19/2015   BUN 20 02/19/2015   CO2 20* 02/19/2015   TSH 3.96 02/06/2015   PSA 0.73 02/05/2015   INR 1.31 02/18/2015   HGBA1C 5.4 06/01/2011    BNP (last 3 results) No  results for input(s): BNP in the last 8760 hours.  ProBNP (last 3 results) No results for input(s): PROBNP in the last 8760 hours.   Other Studies Reviewed Today: 2D Echocardiogram 7.26.2016  Study Conclusions  - Left ventricle: There is an intracavitary gradient of 3mm/Hg. The cavity size was normal. Wall thickness was increased in a pattern of moderate LVH. Systolic function was vigorous. The estimated ejection fraction was in the range of 65% to 70%. Wall motion was normal; there were no regional wall motion abnormalities. - Aortic valve: Thickened aortic valve. Opening is seen. The mean and peak gradients are only mildly elevated. However, the LV cavity size is small with vigorous motion. Also, there is an intracavitary gradient of 33mmHg. There is no mitral SAM. There is no LVOT gradient. The patient will be cathed today. All hemodynamics will be obtained to rule out the slim chance of NORMAL EF/LOW GRADIENT/SEVERE AS. However Stroke volume index of 29ml/M2 in echo lab seems too high for this diagnosis. Mean gradient (S): 12 mm Hg. Peak gradient (S): 20 mm Hg. - Left atrium: The atrium was mildly dilated. - Right ventricle: The cavity size was mildly dilated. Systolic function was normal. _____________  Cardiac Catheterization 7.26.2016  Coronary Findings    Dominance: Right   Left Main  The vessel was injected is large . TIG 4.0 catheter   . LM lesion, 35% stenosed. discrete located at the major branch .     Left Anterior Descending   . Ost LAD lesion, 35% stenosed.   . Prox LAD to Dist LAD lesion, 0% stenosed. located at the major branch . Previously placed Prox LAD to Dist LAD bare metal and drug eluting stents are patent. Bare-metal stent to mid LAD 15 years ago, Promus Premier 2.5 mm x 32 mm (20 mm)   .  First Diagonal Branch   The vessel is small in size.   . First Septal Branch   The vessel is  moderate in size.   Marland Kitchen Second Diagonal Branch   The vessel is small in size.   Colon Flattery 2nd Diag to 2nd Diag lesion, 65% stenosed. discrete located at the major branch .   Marland Kitchen Second Septal Branch   The vessel is small in size.   . Third Septal Branch   The vessel is small in size.     Ramus Intermedius  The vessel is small .     Left Circumflex   . Ost Cx to Prox Cx lesion, 40% stenosed. discrete located at the major branch .   . Prox Cx lesion, 0% stenosed. Previously placed Prox Cx drug eluting stent is patent. Stent extends across AV groove circumflex into OM   . First Obtuse Marginal Branch   . Ost 1st Mrg to 1st Mrg lesion, 0% stenosed. Previously placed Ost 1st Mrg to 1st Mrg drug eluting stent is patent. Promus Premier 3.0 x 12 mm     Right Coronary Artery  The vessel was injected is large . TIG 4.0 catheter There is moderate diffuse disease throughout the vessel.   Colon Flattery RCA to Prox RCA lesion, 30% stenosed. diffuse . The lesion was previously treated with a bare metal stent . Over 15 years ago   . Right Posterior Descending Artery   The vessel is moderate in size.   . Inferior Septal   The vessel is small in size.   . Right Posterior Atrioventricular Branch   The vessel is moderate in size.   . First Right Posterolateral   The vessel is moderate in size.   . Third Right Posterolateral   The vessel is small in size.       Right Heart Pressures LV EDP is normal.    Wall Motion    Hyperdynamic          Left Heart    Left Ventricle Cavitary volume appears diminished There is hyperdynamic left ventricular systolic function. The left ventricular ejection fraction is greater tha 65% by visual estimate. There are no wall motion abnormalities in the left ventricle.   Mitral Valve There is no mitral valve stenosis and moderate (3+) mitral regurgitation. The annulus is calcified.     Aortic Valve There is mild aortic valve stenosis, and no aortic valve regurgitation. Peak gradient 10-15 mmHg Intracavitary gradient of 10-15 mmHg The aortic valve is calcified. Restricted by echo         Assessment/Plan: 1. Post cath - he is doing well. No more chest pain. Energy level has improved. He will continue with his current regimen.   2. CAD with prior PCI from earlier this year - stable anatomy with moderate, diffuse coronary artery disease but also with patent stents in the LAD, circumflex, obtuse marginal, and right coronary artery. LV function was normal. To continue with medical management.  3. Low right heart pressures - now off of Benicar. He feels better.   4. HLD  5. Advanced age.   6. Carotid disease - for repeat study - will get scheduled.   7. Prostate cancer.   Current medicines are reviewed with the patient today.  The patient does not have concerns regarding medicines other than what has been noted above.  The following changes have been made:  See above.  Labs/ tests ordered today include:    Orders Placed This Encounter  Procedures  . Basic metabolic panel  . CBC     Disposition:   FU with Dr. Ron Parker as planned.    Patient is agreeable to this plan and will call if any problems develop in the interim.   Signed: Burtis Junes, RN, ANP-C 03/03/2015 2:07 PM  [facility] [address] [place], [place]  [address] Phone: [phone] Fax: [phone]

## 2015-03-03 NOTE — Patient Instructions (Addendum)
We will be checking the following labs today - BMET and CBC   Medication Instructions:    Continue with your current medicines.     Testing/Procedures To Be Arranged:  N/A  Follow-Up:   See Dr. Ron Parker in September     Other Special Instructions:   Carotid doppler  Call the [place] office at [phone] if you have any questions, problems or concerns.

## 2015-03-03 NOTE — Telephone Encounter (Signed)
Home Health notified of response.

## 2015-03-04 ENCOUNTER — Other Ambulatory Visit: Payer: Medicare Other

## 2015-03-05 ENCOUNTER — Other Ambulatory Visit: Payer: Self-pay | Admitting: Endocrinology

## 2015-03-05 DIAGNOSIS — I6523 Occlusion and stenosis of bilateral carotid arteries: Secondary | ICD-10-CM

## 2015-03-07 DIAGNOSIS — I350 Nonrheumatic aortic (valve) stenosis: Secondary | ICD-10-CM | POA: Diagnosis not present

## 2015-03-07 DIAGNOSIS — I25110 Atherosclerotic heart disease of native coronary artery with unstable angina pectoris: Secondary | ICD-10-CM | POA: Diagnosis not present

## 2015-03-07 DIAGNOSIS — I129 Hypertensive chronic kidney disease with stage 1 through stage 4 chronic kidney disease, or unspecified chronic kidney disease: Secondary | ICD-10-CM | POA: Diagnosis not present

## 2015-03-07 DIAGNOSIS — M4800 Spinal stenosis, site unspecified: Secondary | ICD-10-CM | POA: Diagnosis not present

## 2015-03-07 DIAGNOSIS — N183 Chronic kidney disease, stage 3 (moderate): Secondary | ICD-10-CM | POA: Diagnosis not present

## 2015-03-07 DIAGNOSIS — R42 Dizziness and giddiness: Secondary | ICD-10-CM | POA: Diagnosis not present

## 2015-03-10 ENCOUNTER — Ambulatory Visit (INDEPENDENT_AMBULATORY_CARE_PROVIDER_SITE_OTHER): Payer: Medicare Other | Admitting: Endocrinology

## 2015-03-10 ENCOUNTER — Encounter: Payer: Self-pay | Admitting: Endocrinology

## 2015-03-10 ENCOUNTER — Telehealth: Payer: Self-pay | Admitting: Endocrinology

## 2015-03-10 VITALS — BP 126/60 | HR 68 | Temp 97.5°F | Ht 70.5 in | Wt 162.0 lb

## 2015-03-10 DIAGNOSIS — G4700 Insomnia, unspecified: Secondary | ICD-10-CM

## 2015-03-10 DIAGNOSIS — I208 Other forms of angina pectoris: Secondary | ICD-10-CM

## 2015-03-10 MED ORDER — ZALEPLON 10 MG PO CAPS: 10.0000 mg | ORAL_CAPSULE | Freq: Every evening | ORAL | Status: DC | PRN

## 2015-03-10 MED ORDER — METHIMAZOLE 5 MG PO TABS: ORAL_TABLET | ORAL | Status: DC

## 2015-03-10 NOTE — Telephone Encounter (Signed)
See notes below and please advise, Thanks!

## 2015-03-10 NOTE — Telephone Encounter (Signed)
Joann from [facility] would like to get a verbal order from Dr. Loanne Drilling  Order:  1 Visit last week  3x week for 3 weeks  2x week for 4 weeks   Thank you

## 2015-03-10 NOTE — Patient Instructions (Signed)
Here is a prescription, to double the sonata.   please let me know what your wishes would be, if artificial life support measures should become necessary.  it is critically important to prevent falling down (keep floor areas well-lit, dry, and free of loose objects.  If you have a cane, walker, or wheelchair, you should use it, even for short trips around the house.  Also, try not to rush).

## 2015-03-10 NOTE — Telephone Encounter (Signed)
ok 

## 2015-03-10 NOTE — Telephone Encounter (Signed)
What are these visits for?

## 2015-03-10 NOTE — Progress Notes (Signed)
Subjective:    Patient ID: Kurt Elliott, male    DOB: 06/02/1921, 81 y.o.   MRN: [id number]  HPI Pt returns for f/u of insomnia: he has suffered a general decline in his helpth.  sonata did not help, and ambien caused daytime somnolence.   Past Medical History  Diagnosis Date  . Hypertension   . Hyperlipemia   . Unspecified disorder of liver   . Cellulitis, leg     left  . Pulmonary nodule, right     2008 / unchanged CT scan, July, 2009  . Abdominal pain, left lower quadrant   . Adenocarcinoma of prostate   . Esophageal stricture   . Spinal stenosis   . Hyperglycemia   . Hyperthyroidism   . GERD (gastroesophageal reflux disease)   . Hx of colonic polyps   . Renal artery stenosis     Mild  . Atheroma     no penetrating ulcer, Abdominal aorta  . Inferior mesenteric artery injury     High-grade stenosis, CT scan, 2006  . Vertigo   . Diverticulum   . Incisional hernia   . Rash     Ticlid  . Cough     ACE Inhibitor, tolerates ARB  . Carotid artery disease     Doppler, 2006, minimal disease  //  Doppler, May, 2012, mild increase velocities, 40-59% bilateral stenoses  . Renal cyst     Hyperdense cyst lower pole left kidney  . Dizziness   . Unsteady gait   . Sinus bradycardia   . CAD (coronary artery disease), native coronary artery     a. cath 2008  b. cath 10/21/2014 3v dzs w/ patent RCA stent, severe stenosis of mid LCx/OM1 s/p DES, moderately severe diffuse mid LAD treated with DES, patent prox LAD stent;  c. 01/2015 Cath: patent LAD, LCX, OM, and RCA stents->Med Rx.  . Mild aortic stenosis     a. 09/2014 Echo: EF 65-70%, Gr 1 DD, no rwma, mild AS;  b. 01/2015 Echo: EF 65-70%, mod LVH, no rwma, 29mmHg intracavitary gradient, mildly dil LA.  Marland Kitchen Shortness of breath dyspnea     Past Surgical History  Procedure Laterality Date  . Esophagogastroduodenoscopy    . Cardiac catheterization    . Incisional hernia repair    . Left heart catheterization with coronary angiogram  N/A 10/21/2014    Procedure: LEFT HEART CATHETERIZATION WITH CORONARY ANGIOGRAM;  Surgeon: Sherren Mocha, MD;  Location: [facility] CATH LAB;  Service: Cardiovascular;  Laterality: N/A;  . Percutaneous coronary stent intervention (pci-s)  10/21/2014    Procedure: PERCUTANEOUS CORONARY STENT INTERVENTION (PCI-S);  Surgeon: Sherren Mocha, MD;  Location: [facility] CATH LAB;  Service: Cardiovascular;;  Mid LAD and OM1  . Sigmoid resection / rectopexy    . Cardiac catheterization N/A 02/18/2015    Procedure: Right/Left Heart Cath and Coronary Angiography;  Surgeon: Leonie Man, MD;  Location: [place] CV LAB;  Service: Cardiovascular;  Laterality: N/A;    Social History   Social History  . Marital Status: Married    Spouse Name: N/A  . Number of Children: 4  . Years of Education: N/A   Occupational History  . 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 Smoking status: Never Smoker   . Smokeless tobacco: Never Used  . Alcohol Use: No  . Drug Use: No  . Sexual Activity: Not on file   Other Topics Concern  . Not on file   Social History Narrative  Current Outpatient Prescriptions on File Prior to Visit  Medication Sig Dispense Refill  . aspirin EC 81 MG tablet Take 81 mg by mouth daily.    . clopidogrel (PLAVIX) 75 MG tablet Take 1 tablet (75 mg total) by mouth daily with breakfast. 90 tablet 3  . diazepam (VALIUM) 5 MG tablet Take 2.5 mg by mouth every 6 (six) hours as needed (vertigo).    Marland Kitchen Leuprolide Acetate (LUPRON DEPOT IM) Inject into the muscle as directed. Done at [place] office approx once every 3 months    . metoprolol tartrate (LOPRESSOR) 25 MG tablet Take 0.5 tablets (12.5 mg total) by mouth 2 (two) times daily. 30 tablet 6  . MICRONIZED COLESTIPOL HCL 1 G tablet TAKE 5 TABLETS BY MOUTH EVERY DAY AS DIRECTED (Patient taking differently: TAKE 2 G BY MOUTH DAILY IN THE AFTERNOON) 450 tablet 3  . nitroGLYCERIN (NITROSTAT) 0.4 MG SL tablet Place 0.4 mg under the  tongue every 5 (five) minutes as needed for chest pain.    . pantoprazole (PROTONIX) 40 MG tablet TAKE 1 TABLET (40 MG TOTAL) BY MOUTH DAILY. 90 tablet 3  . triamcinolone cream (KENALOG) 0.1 % Apply 1 application topically daily.   3   No current facility-administered medications on file prior to visit.    Allergies  Allergen Reactions  . Isosorbide Other (See Comments)    "severe Headache" per patient  . Lisinopril Cough  . Ticlopidine Hcl Rash    Family History  Problem Relation Age of Onset  . Breast cancer Sister     2 sisters  . Pemphigus vulgaris Mother     died at 41  . Heart attack Father   . Hypertension Mother   . Hypertension Father   . Hypertension Sister   . Hypertension Brother   . Stroke Father     BP 126/60 mmHg  Pulse 68  Temp(Src) 97.5 F (36.4 C) (Oral)  Ht 5' 10.5" (1.791 m)  Wt 162 lb (73.483 kg)  BMI 22.91 kg/m2  SpO2 95%   Review of Systems Denies falls.      Objective:   Physical Exam VITAL SIGNS:  See vs page.  GENERAL: no distress. PSYCH: Alert and well-oriented.  Does not appear anxious nor depressed. Gait normal and steady.     Lab Results  Component Value Date   TSH 3.96 02/06/2015      Assessment & Plan:  Insomnia, worse.  In view of poor long-term prognosis, we'll give highest priority to quality of life.    Patient is advised the following: Patient Instructions  Here is a prescription, to double the sonata.   please let me know what your wishes would be, if artificial life support measures should become necessary.  it is critically important to prevent falling down (keep floor areas well-lit, dry, and free of loose objects.  If you have a cane, walker, or wheelchair, you should use it, even for short trips around the house.  Also, try not to rush).

## 2015-03-10 NOTE — Telephone Encounter (Signed)
Joann with [place] notified.

## 2015-03-10 NOTE — Telephone Encounter (Signed)
These visits are for the following:  Hip weaknss Balance  Endurance    Thank you

## 2015-03-11 DIAGNOSIS — I25110 Atherosclerotic heart disease of native coronary artery with unstable angina pectoris: Secondary | ICD-10-CM | POA: Diagnosis not present

## 2015-03-11 DIAGNOSIS — I129 Hypertensive chronic kidney disease with stage 1 through stage 4 chronic kidney disease, or unspecified chronic kidney disease: Secondary | ICD-10-CM | POA: Diagnosis not present

## 2015-03-11 DIAGNOSIS — N183 Chronic kidney disease, stage 3 (moderate): Secondary | ICD-10-CM | POA: Diagnosis not present

## 2015-03-11 DIAGNOSIS — R42 Dizziness and giddiness: Secondary | ICD-10-CM | POA: Diagnosis not present

## 2015-03-11 DIAGNOSIS — I350 Nonrheumatic aortic (valve) stenosis: Secondary | ICD-10-CM | POA: Diagnosis not present

## 2015-03-11 DIAGNOSIS — M4800 Spinal stenosis, site unspecified: Secondary | ICD-10-CM | POA: Diagnosis not present

## 2015-03-12 ENCOUNTER — Ambulatory Visit (HOSPITAL_COMMUNITY)
Admission: RE | Admit: 2015-03-12 | Discharge: 2015-03-12 | Disposition: A | Discharge status: Home / Self Care | Payer: Medicare Other | Source: Ambulatory Visit | Attending: Cardiology | Admitting: Cardiology

## 2015-03-12 DIAGNOSIS — I129 Hypertensive chronic kidney disease with stage 1 through stage 4 chronic kidney disease, or unspecified chronic kidney disease: Secondary | ICD-10-CM | POA: Diagnosis not present

## 2015-03-12 DIAGNOSIS — I6523 Occlusion and stenosis of bilateral carotid arteries: Secondary | ICD-10-CM | POA: Diagnosis not present

## 2015-03-12 DIAGNOSIS — I25110 Atherosclerotic heart disease of native coronary artery with unstable angina pectoris: Secondary | ICD-10-CM | POA: Diagnosis not present

## 2015-03-12 DIAGNOSIS — I350 Nonrheumatic aortic (valve) stenosis: Secondary | ICD-10-CM | POA: Diagnosis not present

## 2015-03-12 DIAGNOSIS — M4800 Spinal stenosis, site unspecified: Secondary | ICD-10-CM | POA: Diagnosis not present

## 2015-03-12 DIAGNOSIS — R42 Dizziness and giddiness: Secondary | ICD-10-CM | POA: Diagnosis not present

## 2015-03-12 DIAGNOSIS — N183 Chronic kidney disease, stage 3 (moderate): Secondary | ICD-10-CM | POA: Diagnosis not present

## 2015-03-13 DIAGNOSIS — I25110 Atherosclerotic heart disease of native coronary artery with unstable angina pectoris: Secondary | ICD-10-CM | POA: Diagnosis not present

## 2015-03-14 DIAGNOSIS — N183 Chronic kidney disease, stage 3 (moderate): Secondary | ICD-10-CM | POA: Diagnosis not present

## 2015-03-14 DIAGNOSIS — M4800 Spinal stenosis, site unspecified: Secondary | ICD-10-CM | POA: Diagnosis not present

## 2015-03-14 DIAGNOSIS — I350 Nonrheumatic aortic (valve) stenosis: Secondary | ICD-10-CM | POA: Diagnosis not present

## 2015-03-14 DIAGNOSIS — R42 Dizziness and giddiness: Secondary | ICD-10-CM | POA: Diagnosis not present

## 2015-03-14 DIAGNOSIS — I129 Hypertensive chronic kidney disease with stage 1 through stage 4 chronic kidney disease, or unspecified chronic kidney disease: Secondary | ICD-10-CM | POA: Diagnosis not present

## 2015-03-14 DIAGNOSIS — I25110 Atherosclerotic heart disease of native coronary artery with unstable angina pectoris: Secondary | ICD-10-CM | POA: Diagnosis not present

## 2015-03-17 DIAGNOSIS — I350 Nonrheumatic aortic (valve) stenosis: Secondary | ICD-10-CM | POA: Diagnosis not present

## 2015-03-17 DIAGNOSIS — N183 Chronic kidney disease, stage 3 (moderate): Secondary | ICD-10-CM | POA: Diagnosis not present

## 2015-03-17 DIAGNOSIS — I129 Hypertensive chronic kidney disease with stage 1 through stage 4 chronic kidney disease, or unspecified chronic kidney disease: Secondary | ICD-10-CM | POA: Diagnosis not present

## 2015-03-17 DIAGNOSIS — I25110 Atherosclerotic heart disease of native coronary artery with unstable angina pectoris: Secondary | ICD-10-CM | POA: Diagnosis not present

## 2015-03-17 DIAGNOSIS — R42 Dizziness and giddiness: Secondary | ICD-10-CM | POA: Diagnosis not present

## 2015-03-17 DIAGNOSIS — M4800 Spinal stenosis, site unspecified: Secondary | ICD-10-CM | POA: Diagnosis not present

## 2015-03-19 DIAGNOSIS — N183 Chronic kidney disease, stage 3 (moderate): Secondary | ICD-10-CM | POA: Diagnosis not present

## 2015-03-19 DIAGNOSIS — I129 Hypertensive chronic kidney disease with stage 1 through stage 4 chronic kidney disease, or unspecified chronic kidney disease: Secondary | ICD-10-CM | POA: Diagnosis not present

## 2015-03-19 DIAGNOSIS — R42 Dizziness and giddiness: Secondary | ICD-10-CM | POA: Diagnosis not present

## 2015-03-19 DIAGNOSIS — I25110 Atherosclerotic heart disease of native coronary artery with unstable angina pectoris: Secondary | ICD-10-CM | POA: Diagnosis not present

## 2015-03-19 DIAGNOSIS — M4800 Spinal stenosis, site unspecified: Secondary | ICD-10-CM | POA: Diagnosis not present

## 2015-03-19 DIAGNOSIS — I350 Nonrheumatic aortic (valve) stenosis: Secondary | ICD-10-CM | POA: Diagnosis not present

## 2015-03-21 DIAGNOSIS — M4800 Spinal stenosis, site unspecified: Secondary | ICD-10-CM | POA: Diagnosis not present

## 2015-03-21 DIAGNOSIS — I350 Nonrheumatic aortic (valve) stenosis: Secondary | ICD-10-CM | POA: Diagnosis not present

## 2015-03-21 DIAGNOSIS — I129 Hypertensive chronic kidney disease with stage 1 through stage 4 chronic kidney disease, or unspecified chronic kidney disease: Secondary | ICD-10-CM | POA: Diagnosis not present

## 2015-03-21 DIAGNOSIS — I25110 Atherosclerotic heart disease of native coronary artery with unstable angina pectoris: Secondary | ICD-10-CM | POA: Diagnosis not present

## 2015-03-21 DIAGNOSIS — N183 Chronic kidney disease, stage 3 (moderate): Secondary | ICD-10-CM | POA: Diagnosis not present

## 2015-03-21 DIAGNOSIS — R42 Dizziness and giddiness: Secondary | ICD-10-CM | POA: Diagnosis not present

## 2015-03-24 DIAGNOSIS — M4800 Spinal stenosis, site unspecified: Secondary | ICD-10-CM | POA: Diagnosis not present

## 2015-03-24 DIAGNOSIS — I350 Nonrheumatic aortic (valve) stenosis: Secondary | ICD-10-CM | POA: Diagnosis not present

## 2015-03-24 DIAGNOSIS — R42 Dizziness and giddiness: Secondary | ICD-10-CM | POA: Diagnosis not present

## 2015-03-24 DIAGNOSIS — I129 Hypertensive chronic kidney disease with stage 1 through stage 4 chronic kidney disease, or unspecified chronic kidney disease: Secondary | ICD-10-CM | POA: Diagnosis not present

## 2015-03-24 DIAGNOSIS — I25110 Atherosclerotic heart disease of native coronary artery with unstable angina pectoris: Secondary | ICD-10-CM | POA: Diagnosis not present

## 2015-03-24 DIAGNOSIS — N183 Chronic kidney disease, stage 3 (moderate): Secondary | ICD-10-CM | POA: Diagnosis not present

## 2015-03-25 DIAGNOSIS — R05 Cough: Secondary | ICD-10-CM | POA: Diagnosis not present

## 2015-03-27 DIAGNOSIS — R42 Dizziness and giddiness: Secondary | ICD-10-CM | POA: Diagnosis not present

## 2015-03-27 DIAGNOSIS — I350 Nonrheumatic aortic (valve) stenosis: Secondary | ICD-10-CM | POA: Diagnosis not present

## 2015-03-27 DIAGNOSIS — M4800 Spinal stenosis, site unspecified: Secondary | ICD-10-CM | POA: Diagnosis not present

## 2015-03-27 DIAGNOSIS — I25110 Atherosclerotic heart disease of native coronary artery with unstable angina pectoris: Secondary | ICD-10-CM | POA: Diagnosis not present

## 2015-03-27 DIAGNOSIS — N183 Chronic kidney disease, stage 3 (moderate): Secondary | ICD-10-CM | POA: Diagnosis not present

## 2015-03-27 DIAGNOSIS — I129 Hypertensive chronic kidney disease with stage 1 through stage 4 chronic kidney disease, or unspecified chronic kidney disease: Secondary | ICD-10-CM | POA: Diagnosis not present

## 2015-03-28 ENCOUNTER — Ambulatory Visit
Admission: RE | Admit: 2015-03-28 | Discharge: 2015-03-28 | Disposition: A | Discharge status: Home / Self Care | Payer: Medicare Other | Source: Ambulatory Visit | Attending: Endocrinology | Admitting: Endocrinology

## 2015-03-28 ENCOUNTER — Encounter: Payer: Self-pay | Admitting: Endocrinology

## 2015-03-28 ENCOUNTER — Ambulatory Visit (INDEPENDENT_AMBULATORY_CARE_PROVIDER_SITE_OTHER): Payer: Medicare Other | Admitting: Endocrinology

## 2015-03-28 VITALS — BP 134/70 | HR 81 | Temp 98.4°F | Ht 70.5 in | Wt 164.0 lb

## 2015-03-28 DIAGNOSIS — R059 Cough, unspecified: Secondary | ICD-10-CM

## 2015-03-28 DIAGNOSIS — I208 Other forms of angina pectoris: Secondary | ICD-10-CM

## 2015-03-28 DIAGNOSIS — R05 Cough: Secondary | ICD-10-CM

## 2015-03-28 MED ORDER — TRAMADOL HCL 50 MG PO TABS: 50.0000 mg | ORAL_TABLET | Freq: Four times a day (QID) | ORAL | Status: DC | PRN

## 2015-03-28 MED ORDER — CEFUROXIME AXETIL 250 MG PO TABS: 250.0000 mg | ORAL_TABLET | Freq: Two times a day (BID) | ORAL | Status: AC

## 2015-03-28 NOTE — Progress Notes (Signed)
Subjective:    Patient ID: Kurt Elliott, male    DOB: 03-19-1921, 81 y.o.   MRN: [id number]  HPI Pt states 4 days of slight dry-quality cough in the chest, but no assoc wheezing.   Past Medical History  Diagnosis Date  . Hypertension   . Hyperlipemia   . Unspecified disorder of liver   . Cellulitis, leg     left  . Pulmonary nodule, right     2008 / unchanged CT scan, July, 2009  . Abdominal pain, left lower quadrant   . Adenocarcinoma of prostate   . Esophageal stricture   . Spinal stenosis   . Hyperglycemia   . Hyperthyroidism   . GERD (gastroesophageal reflux disease)   . Hx of colonic polyps   . Renal artery stenosis     Mild  . Atheroma     no penetrating ulcer, Abdominal aorta  . Inferior mesenteric artery injury     High-grade stenosis, CT scan, 2006  . Vertigo   . Diverticulum   . Incisional hernia   . Rash     Ticlid  . Cough     ACE Inhibitor, tolerates ARB  . Carotid artery disease     Doppler, 2006, minimal disease  //  Doppler, May, 2012, mild increase velocities, 40-59% bilateral stenoses  . Renal cyst     Hyperdense cyst lower pole left kidney  . Dizziness   . Unsteady gait   . Sinus bradycardia   . CAD (coronary artery disease), native coronary artery     a. cath 2008  b. cath 10/21/2014 3v dzs w/ patent RCA stent, severe stenosis of mid LCx/OM1 s/p DES, moderately severe diffuse mid LAD treated with DES, patent prox LAD stent;  c. 01/2015 Cath: patent LAD, LCX, OM, and RCA stents->Med Rx.  . Mild aortic stenosis     a. 09/2014 Echo: EF 65-70%, Gr 1 DD, no rwma, mild AS;  b. 01/2015 Echo: EF 65-70%, mod LVH, no rwma, 66mmHg intracavitary gradient, mildly dil LA.  Marland Kitchen Shortness of breath dyspnea     Past Surgical History  Procedure Laterality Date  . Esophagogastroduodenoscopy    . Cardiac catheterization    . Incisional hernia repair    . Left heart catheterization with coronary angiogram N/A 10/21/2014    Procedure: LEFT HEART CATHETERIZATION  WITH CORONARY ANGIOGRAM;  Surgeon: Sherren Mocha, MD;  Location: [facility] CATH LAB;  Service: Cardiovascular;  Laterality: N/A;  . Percutaneous coronary stent intervention (pci-s)  10/21/2014    Procedure: PERCUTANEOUS CORONARY STENT INTERVENTION (PCI-S);  Surgeon: Sherren Mocha, MD;  Location: [facility] CATH LAB;  Service: Cardiovascular;;  Mid LAD and OM1  . Sigmoid resection / rectopexy    . Cardiac catheterization N/A 02/18/2015    Procedure: Right/Left Heart Cath and Coronary Angiography;  Surgeon: Leonie Man, MD;  Location: [place] CV LAB;  Service: Cardiovascular;  Laterality: N/A;    Social History   Social History  . Marital Status: Married    Spouse Name: N/A  . Number of Children: 4  . Years of Education: N/A   Occupational History  . 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 Smoking status: Never Smoker   . Smokeless tobacco: Never Used  . Alcohol Use: No  . Drug Use: No  . Sexual Activity: Not on file   Other Topics Concern  . Not on file   Social History Narrative    Current Outpatient Prescriptions on File Prior to  Visit  Medication Sig Dispense Refill  . aspirin EC 81 MG tablet Take 81 mg by mouth daily.    . clopidogrel (PLAVIX) 75 MG tablet Take 1 tablet (75 mg total) by mouth daily with breakfast. 90 tablet 3  . diazepam (VALIUM) 5 MG tablet Take 2.5 mg by mouth every 6 (six) hours as needed (vertigo).    Marland Kitchen Leuprolide Acetate (LUPRON DEPOT IM) Inject into the muscle as directed. Done at [place] office approx once every 3 months    . methimazole (TAPAZOLE) 5 MG tablet TAKE 1 TABLET BY MOUTH 3 TIMES A WEEK (MON, WED, AND FRI) 15 tablet 11  . metoprolol tartrate (LOPRESSOR) 25 MG tablet Take 0.5 tablets (12.5 mg total) by mouth 2 (two) times daily. 30 tablet 6  . MICRONIZED COLESTIPOL HCL 1 G tablet TAKE 5 TABLETS BY MOUTH EVERY DAY AS DIRECTED (Patient taking differently: TAKE 2 G BY MOUTH DAILY IN THE AFTERNOON) 450 tablet 3  . nitroGLYCERIN  (NITROSTAT) 0.4 MG SL tablet Place 0.4 mg under the tongue every 5 (five) minutes as needed for chest pain.    . pantoprazole (PROTONIX) 40 MG tablet TAKE 1 TABLET (40 MG TOTAL) BY MOUTH DAILY. 90 tablet 3  . triamcinolone cream (KENALOG) 0.1 % Apply 1 application topically daily.   3  . zaleplon (SONATA) 10 MG capsule Take 1 capsule (10 mg total) by mouth at bedtime as needed for sleep. 30 capsule 5   No current facility-administered medications on file prior to visit.    Allergies  Allergen Reactions  . Isosorbide Other (See Comments)    "severe Headache" per patient  . Lisinopril Cough  . Ticlopidine Hcl Rash    Family History  Problem Relation Age of Onset  . Breast cancer Sister     2 sisters  . Pemphigus vulgaris Mother     died at 72  . Heart attack Father   . Hypertension Mother   . Hypertension Father   . Hypertension Sister   . Hypertension Brother   . Stroke Father     BP 134/70 mmHg  Pulse 81  Temp(Src) 98.4 F (36.9 C) (Oral)  Ht 5' 10.5" (1.791 m)  Wt 164 lb (74.39 kg)  BMI 23.19 kg/m2  SpO2 95%  Review of Systems Denies sob and fever.     Objective:   Physical Exam VITAL SIGNS:  See vs page GENERAL: no distress head: no deformity eyes: no periorbital swelling, no proptosis external nose and ears are normal mouth: no lesion seen Both eac's and tm's are normal.   LUNGS:  Clear to auscultation.    CXR: NAD    Assessment & Plan:  Acute bronchitis, new Insomnia: there is an interaction between ultram and sonata  Patient is advised the following: Patient Instructions  A chest-x-ray is requested for you today.  We'll let you know about the results. i have sent a prescription to your pharmacy, for an antibiotic pill.   I hope you feel better soon.  If you don't feel better by next week, please call back.  Please call sooner if you get worse.   Here is a prescription for the cough.   Take care taking this with sonata, as the combination can make  you very drowsy.

## 2015-03-28 NOTE — Patient Instructions (Addendum)
A chest-x-ray is requested for you today.  We'll let you know about the results. i have sent a prescription to your pharmacy, for an antibiotic pill.   I hope you feel better soon.  If you don't feel better by next week, please call back.  Please call sooner if you get worse.   Here is a prescription for the cough.   Take care taking this with sonata, as the combination can make you very drowsy.

## 2015-04-01 DIAGNOSIS — I129 Hypertensive chronic kidney disease with stage 1 through stage 4 chronic kidney disease, or unspecified chronic kidney disease: Secondary | ICD-10-CM | POA: Diagnosis not present

## 2015-04-01 DIAGNOSIS — M4800 Spinal stenosis, site unspecified: Secondary | ICD-10-CM | POA: Diagnosis not present

## 2015-04-01 DIAGNOSIS — I25110 Atherosclerotic heart disease of native coronary artery with unstable angina pectoris: Secondary | ICD-10-CM | POA: Diagnosis not present

## 2015-04-01 DIAGNOSIS — R42 Dizziness and giddiness: Secondary | ICD-10-CM | POA: Diagnosis not present

## 2015-04-01 DIAGNOSIS — I350 Nonrheumatic aortic (valve) stenosis: Secondary | ICD-10-CM | POA: Diagnosis not present

## 2015-04-01 DIAGNOSIS — N183 Chronic kidney disease, stage 3 (moderate): Secondary | ICD-10-CM | POA: Diagnosis not present

## 2015-04-02 DIAGNOSIS — H6123 Impacted cerumen, bilateral: Secondary | ICD-10-CM | POA: Diagnosis not present

## 2015-04-03 DIAGNOSIS — I350 Nonrheumatic aortic (valve) stenosis: Secondary | ICD-10-CM | POA: Diagnosis not present

## 2015-04-03 DIAGNOSIS — N183 Chronic kidney disease, stage 3 (moderate): Secondary | ICD-10-CM | POA: Diagnosis not present

## 2015-04-03 DIAGNOSIS — R42 Dizziness and giddiness: Secondary | ICD-10-CM | POA: Diagnosis not present

## 2015-04-03 DIAGNOSIS — I25110 Atherosclerotic heart disease of native coronary artery with unstable angina pectoris: Secondary | ICD-10-CM | POA: Diagnosis not present

## 2015-04-03 DIAGNOSIS — M4800 Spinal stenosis, site unspecified: Secondary | ICD-10-CM | POA: Diagnosis not present

## 2015-04-03 DIAGNOSIS — I129 Hypertensive chronic kidney disease with stage 1 through stage 4 chronic kidney disease, or unspecified chronic kidney disease: Secondary | ICD-10-CM | POA: Diagnosis not present

## 2015-04-04 ENCOUNTER — Telehealth: Payer: Self-pay

## 2015-04-04 NOTE — Telephone Encounter (Signed)
Joann from [facility] called to report the pt stopped taking his antibiotic after 4 days because he was feeling better.

## 2015-04-07 DIAGNOSIS — I129 Hypertensive chronic kidney disease with stage 1 through stage 4 chronic kidney disease, or unspecified chronic kidney disease: Secondary | ICD-10-CM | POA: Diagnosis not present

## 2015-04-07 DIAGNOSIS — I350 Nonrheumatic aortic (valve) stenosis: Secondary | ICD-10-CM | POA: Diagnosis not present

## 2015-04-07 DIAGNOSIS — M4800 Spinal stenosis, site unspecified: Secondary | ICD-10-CM | POA: Diagnosis not present

## 2015-04-07 DIAGNOSIS — N183 Chronic kidney disease, stage 3 (moderate): Secondary | ICD-10-CM | POA: Diagnosis not present

## 2015-04-07 DIAGNOSIS — R42 Dizziness and giddiness: Secondary | ICD-10-CM | POA: Diagnosis not present

## 2015-04-07 DIAGNOSIS — I25110 Atherosclerotic heart disease of native coronary artery with unstable angina pectoris: Secondary | ICD-10-CM | POA: Diagnosis not present

## 2015-04-10 DIAGNOSIS — M4800 Spinal stenosis, site unspecified: Secondary | ICD-10-CM | POA: Diagnosis not present

## 2015-04-10 DIAGNOSIS — N183 Chronic kidney disease, stage 3 (moderate): Secondary | ICD-10-CM | POA: Diagnosis not present

## 2015-04-10 DIAGNOSIS — R42 Dizziness and giddiness: Secondary | ICD-10-CM | POA: Diagnosis not present

## 2015-04-10 DIAGNOSIS — I129 Hypertensive chronic kidney disease with stage 1 through stage 4 chronic kidney disease, or unspecified chronic kidney disease: Secondary | ICD-10-CM | POA: Diagnosis not present

## 2015-04-10 DIAGNOSIS — I350 Nonrheumatic aortic (valve) stenosis: Secondary | ICD-10-CM | POA: Diagnosis not present

## 2015-04-10 DIAGNOSIS — I25110 Atherosclerotic heart disease of native coronary artery with unstable angina pectoris: Secondary | ICD-10-CM | POA: Diagnosis not present

## 2015-04-14 ENCOUNTER — Ambulatory Visit (INDEPENDENT_AMBULATORY_CARE_PROVIDER_SITE_OTHER): Payer: Medicare Other

## 2015-04-14 DIAGNOSIS — I25110 Atherosclerotic heart disease of native coronary artery with unstable angina pectoris: Secondary | ICD-10-CM | POA: Diagnosis not present

## 2015-04-14 DIAGNOSIS — I350 Nonrheumatic aortic (valve) stenosis: Secondary | ICD-10-CM | POA: Diagnosis not present

## 2015-04-14 DIAGNOSIS — Z23 Encounter for immunization: Secondary | ICD-10-CM

## 2015-04-14 DIAGNOSIS — R42 Dizziness and giddiness: Secondary | ICD-10-CM | POA: Diagnosis not present

## 2015-04-14 DIAGNOSIS — I129 Hypertensive chronic kidney disease with stage 1 through stage 4 chronic kidney disease, or unspecified chronic kidney disease: Secondary | ICD-10-CM | POA: Diagnosis not present

## 2015-04-14 DIAGNOSIS — N183 Chronic kidney disease, stage 3 (moderate): Secondary | ICD-10-CM | POA: Diagnosis not present

## 2015-04-14 DIAGNOSIS — M4800 Spinal stenosis, site unspecified: Secondary | ICD-10-CM | POA: Diagnosis not present

## 2015-04-17 DIAGNOSIS — I25110 Atherosclerotic heart disease of native coronary artery with unstable angina pectoris: Secondary | ICD-10-CM | POA: Diagnosis not present

## 2015-04-17 DIAGNOSIS — M4800 Spinal stenosis, site unspecified: Secondary | ICD-10-CM | POA: Diagnosis not present

## 2015-04-17 DIAGNOSIS — I350 Nonrheumatic aortic (valve) stenosis: Secondary | ICD-10-CM | POA: Diagnosis not present

## 2015-04-17 DIAGNOSIS — R42 Dizziness and giddiness: Secondary | ICD-10-CM | POA: Diagnosis not present

## 2015-04-17 DIAGNOSIS — N183 Chronic kidney disease, stage 3 (moderate): Secondary | ICD-10-CM | POA: Diagnosis not present

## 2015-04-17 DIAGNOSIS — I129 Hypertensive chronic kidney disease with stage 1 through stage 4 chronic kidney disease, or unspecified chronic kidney disease: Secondary | ICD-10-CM | POA: Diagnosis not present

## 2015-04-21 ENCOUNTER — Ambulatory Visit (INDEPENDENT_AMBULATORY_CARE_PROVIDER_SITE_OTHER): Payer: Medicare Other | Admitting: Cardiology

## 2015-04-21 ENCOUNTER — Encounter: Payer: Self-pay | Admitting: Cardiology

## 2015-04-21 VITALS — BP 145/58 | HR 64 | Ht 70.5 in | Wt 161.0 lb

## 2015-04-21 DIAGNOSIS — I2510 Atherosclerotic heart disease of native coronary artery without angina pectoris: Secondary | ICD-10-CM | POA: Diagnosis not present

## 2015-04-21 DIAGNOSIS — N183 Chronic kidney disease, stage 3 (moderate): Secondary | ICD-10-CM | POA: Diagnosis not present

## 2015-04-21 DIAGNOSIS — R42 Dizziness and giddiness: Secondary | ICD-10-CM | POA: Diagnosis not present

## 2015-04-21 DIAGNOSIS — I350 Nonrheumatic aortic (valve) stenosis: Secondary | ICD-10-CM | POA: Diagnosis not present

## 2015-04-21 DIAGNOSIS — I208 Other forms of angina pectoris: Secondary | ICD-10-CM

## 2015-04-21 DIAGNOSIS — I779 Disorder of arteries and arterioles, unspecified: Secondary | ICD-10-CM | POA: Diagnosis not present

## 2015-04-21 DIAGNOSIS — I129 Hypertensive chronic kidney disease with stage 1 through stage 4 chronic kidney disease, or unspecified chronic kidney disease: Secondary | ICD-10-CM | POA: Diagnosis not present

## 2015-04-21 DIAGNOSIS — I739 Peripheral vascular disease, unspecified: Secondary | ICD-10-CM

## 2015-04-21 DIAGNOSIS — M4800 Spinal stenosis, site unspecified: Secondary | ICD-10-CM | POA: Diagnosis not present

## 2015-04-21 DIAGNOSIS — I25110 Atherosclerotic heart disease of native coronary artery with unstable angina pectoris: Secondary | ICD-10-CM | POA: Diagnosis not present

## 2015-04-21 NOTE — Progress Notes (Signed)
Cardiology Office Note   Date:  04/21/2015   ID:  GERMANY CHELF, DOB 1921/01/07, MRN [id number]  PCP:  Renato Shin, MD  Cardiologist:  Dola Argyle, MD   Chief Complaint  Patient presents with  . Appointment    Follow-up coronary disease      History of Present Illness: Kurt Elliott is a 81 y.o. male who presents today to follow-up coronary artery disease. The patient received coronary stents in March, 2016. In July, 2016 he felt fatigued and short of breath. There was no obvious sign of heart failure. Eventually cardiac catheterization was done to rule out the possibility of ischemia as the basis of his shortness of breath. Catheterization showed that his aortic stenosis was stable and mild. His coronary disease was stable. LV function was good. Right heart analysis revealed low filling pressures. It was eventually felt that his overall illness was related to decreased diet and activity. He received good advice and help concerning his diet and exercise level. He is now greatly improved.    Past Medical History  Diagnosis Date  . Hypertension   . Hyperlipemia   . Unspecified disorder of liver   . Cellulitis, leg     left  . Pulmonary nodule, right     2008 / unchanged CT scan, July, 2009  . Abdominal pain, left lower quadrant   . Adenocarcinoma of prostate   . Esophageal stricture   . Spinal stenosis   . Hyperglycemia   . Hyperthyroidism   . GERD (gastroesophageal reflux disease)   . Hx of colonic polyps   . Renal artery stenosis     Mild  . Atheroma     no penetrating ulcer, Abdominal aorta  . Inferior mesenteric artery injury     High-grade stenosis, CT scan, 2006  . Vertigo   . Diverticulum   . Incisional hernia   . Rash     Ticlid  . Cough     ACE Inhibitor, tolerates ARB  . Carotid artery disease     Doppler, 2006, minimal disease  //  Doppler, May, 2012, mild increase velocities, 40-59% bilateral stenoses  . Renal cyst     Hyperdense cyst lower  pole left kidney  . Dizziness   . Unsteady gait   . Sinus bradycardia   . CAD (coronary artery disease), native coronary artery     a. cath 2008  b. cath 10/21/2014 3v dzs w/ patent RCA stent, severe stenosis of mid LCx/OM1 s/p DES, moderately severe diffuse mid LAD treated with DES, patent prox LAD stent;  c. 01/2015 Cath: patent LAD, LCX, OM, and RCA stents->Med Rx.  . Mild aortic stenosis     a. 09/2014 Echo: EF 65-70%, Gr 1 DD, no rwma, mild AS;  b. 01/2015 Echo: EF 65-70%, mod LVH, no rwma, 89mmHg intracavitary gradient, mildly dil LA.  Marland Kitchen Shortness of breath dyspnea     Past Surgical History  Procedure Laterality Date  . Esophagogastroduodenoscopy    . Cardiac catheterization    . Incisional hernia repair    . Left heart catheterization with coronary angiogram N/A 10/21/2014    Procedure: LEFT HEART CATHETERIZATION WITH CORONARY ANGIOGRAM;  Surgeon: Sherren Mocha, MD;  Location: [facility] CATH LAB;  Service: Cardiovascular;  Laterality: N/A;  . Percutaneous coronary stent intervention (pci-s)  10/21/2014    Procedure: PERCUTANEOUS CORONARY STENT INTERVENTION (PCI-S);  Surgeon: Sherren Mocha, MD;  Location: [facility] CATH LAB;  Service: Cardiovascular;;  Mid LAD and OM1  . Sigmoid  resection / rectopexy    . Cardiac catheterization N/A 02/18/2015    Procedure: Right/Left Heart Cath and Coronary Angiography;  Surgeon: Leonie Man, MD;  Location: [place] CV LAB;  Service: Cardiovascular;  Laterality: N/A;    Patient Active Problem List   Diagnosis Date Noted  . Insomnia 02/19/2015  . Physical deconditioning 02/19/2015  . Dietary deficiency 02/19/2015  . Mitral regurgitation 02/19/2015  . Anemia 02/19/2015  . Aortic stenosis 02/18/2015  . Fatigue 02/18/2015  . Exertional shortness of breath 02/18/2015  . Sinus tachycardia 02/18/2015  . CKD (chronic kidney disease) stage 3, GFR 30-59 ml/min 02/18/2015  . Hip pain, bilateral 03/25/2014  . Dizziness   . Unsteady gait   . Sinus bradycardia     . Ejection fraction   . Carotid artery disease   . CAD (coronary artery disease)   . Pulmonary nodule, right   . Renal artery stenosis   . Rash   . Cough   . Renal cyst   . Inferior mesenteric artery injury   . ACUTE BRONCHITIS 06/29/2010  . VERTIGO 11/27/2009  . CELLULITIS, LEG, LEFT 12/30/2008  . UNSPECIFIED DISORDER OF LIVER 04/04/2008  . ABDOMINAL PAIN, LEFT LOWER QUADRANT 08/18/2007  . ADENOCARCINOMA, PROSTATE 06/15/2007  . Thyrotoxicosis 06/13/2007  . Hyperlipidemia 06/13/2007  . Essential hypertension 06/13/2007  . ESOPHAGEAL STRICTURE 06/13/2007  . GERD 06/13/2007  . SPINAL STENOSIS 06/13/2007  . HYPERGLYCEMIA 06/13/2007  . COLONIC POLYPS, HX OF 06/13/2007      Current Outpatient Prescriptions  Medication Sig Dispense Refill  . aspirin EC 81 MG tablet Take 81 mg by mouth daily.    . clopidogrel (PLAVIX) 75 MG tablet Take 1 tablet (75 mg total) by mouth daily with breakfast. 90 tablet 3  . colestipol (COLESTID) 1 G tablet Take 1 g by mouth daily. PT TAKES 2 TABLET BY MOUTH DAILY    . Leuprolide Acetate (LUPRON DEPOT IM) Inject into the muscle as directed. Done at [place] office approx once every 3 months    . methimazole (TAPAZOLE) 5 MG tablet TAKE 1 TABLET BY MOUTH 3 TIMES A WEEK (MON, WED, AND FRI) 15 tablet 11  . metoprolol tartrate (LOPRESSOR) 25 MG tablet Take 0.5 tablets (12.5 mg total) by mouth 2 (two) times daily. 30 tablet 6  . nitroGLYCERIN (NITROSTAT) 0.4 MG SL tablet Place 0.4 mg under the tongue every 5 (five) minutes as needed for chest pain.    . pantoprazole (PROTONIX) 40 MG tablet TAKE 1 TABLET (40 MG TOTAL) BY MOUTH DAILY. 90 tablet 3  . triamcinolone cream (KENALOG) 0.1 % Apply 1 application topically daily.   3  . zaleplon (SONATA) 10 MG capsule Take 1 capsule (10 mg total) by mouth at bedtime as needed for sleep. 30 capsule 5   No current facility-administered medications for this visit.    Allergies:   Isosorbide; Lisinopril; and Ticlopidine  hcl    Social History:  The patient  reports that he has never smoked. He has never used smokeless tobacco. He reports that he does not drink alcohol or use illicit drugs.   Family History:  The patient's family history includes Breast cancer in his sister; Heart attack in his father; Hypertension in his brother, father, mother, and sister; Pemphigus vulgaris in his mother; Stroke in his father.    ROS:  Please see the history of present illness.   Patient denies fever, chills, headache, sweats, rash, change in vision, change in hearing, chest pain, cough, nausea or vomiting,  urinary symptoms. All other systems are reviewed and are negative.     PHYSICAL EXAM: VS:  BP 145/58 mmHg  Pulse 64  Ht 5' 10.5" (1.791 m)  Wt 161 lb (73.029 kg)  BMI 22.77 kg/m2 , Patient is oriented to person time and place. Affect is normal. He is here with his wife. Head is atraumatic. Sclera and conjunctiva are normal. There is no jugular venous distention. Lungs are clear. Respiratory effort is nonlabored. Cardiac exam reveals an S1 and S2. The abdomen is soft. There is no peripheral edema. There are no musculoskeletal deformities. There are no skin rashes.   EKG:  EKG is not done today.    Recent Labs: 02/05/2015: ALT 26 02/06/2015: TSH 3.96 03/03/2015: BUN 30*; Creatinine, Ser 1.21; Hemoglobin 10.2*; Platelets 189.0; Potassium 4.4; Sodium 137    Lipid Panel    Component Value Date/Time   CHOL 149 02/18/2015 0416   TRIG 51 02/18/2015 0416   TRIG 137 05/23/2006 0817   HDL 43 02/18/2015 0416   CHOLHDL 3.5 02/18/2015 0416   CHOLHDL 3.6 CALC 05/23/2006 0817   VLDL 10 02/18/2015 0416   LDLCALC 96 02/18/2015 0416   LDLDIRECT 97.5 06/01/2011 1419      Wt Readings from Last 3 Encounters:  04/21/15 161 lb (73.029 kg)  03/28/15 164 lb (74.39 kg)  03/10/15 162 lb (73.483 kg)      Current medicines are reviewed  The patient understands his medications.     ASSESSMENT AND PLAN:

## 2015-04-21 NOTE — Patient Instructions (Signed)
Medication Instructions:  Same-no changes  Labwork: None  Testing/Procedures: None  Follow-Up: Your physician wants you to follow-up in: November 2017 with Dr Burt Knack. You will receive a reminder letter in the mail two months in advance. If you don't receive a letter, please call our office to schedule the follow-up appointment.

## 2015-04-21 NOTE — Assessment & Plan Note (Signed)
Coronary disease is stable. Catheterization in July, 2016 revealed that his stents are patent. No intervention was needed.

## 2015-04-21 NOTE — Assessment & Plan Note (Signed)
The patient has mild aortic stenosis. Very careful thought was given to the possibility of normal EF, low output, severe aortic stenosis. He does not have this.

## 2015-04-21 NOTE — Assessment & Plan Note (Signed)
Doppler from August, 2016 reveals stable mild disease. He does not need a follow-up study.

## 2015-04-23 DIAGNOSIS — R42 Dizziness and giddiness: Secondary | ICD-10-CM | POA: Diagnosis not present

## 2015-04-23 DIAGNOSIS — I25110 Atherosclerotic heart disease of native coronary artery with unstable angina pectoris: Secondary | ICD-10-CM | POA: Diagnosis not present

## 2015-04-23 DIAGNOSIS — M4800 Spinal stenosis, site unspecified: Secondary | ICD-10-CM | POA: Diagnosis not present

## 2015-04-23 DIAGNOSIS — I129 Hypertensive chronic kidney disease with stage 1 through stage 4 chronic kidney disease, or unspecified chronic kidney disease: Secondary | ICD-10-CM | POA: Diagnosis not present

## 2015-04-23 DIAGNOSIS — N183 Chronic kidney disease, stage 3 (moderate): Secondary | ICD-10-CM | POA: Diagnosis not present

## 2015-04-23 DIAGNOSIS — I350 Nonrheumatic aortic (valve) stenosis: Secondary | ICD-10-CM | POA: Diagnosis not present

## 2015-05-01 ENCOUNTER — Telehealth: Payer: Self-pay | Admitting: Endocrinology

## 2015-05-01 MED ORDER — PANTOPRAZOLE SODIUM 40 MG PO TBEC: DELAYED_RELEASE_TABLET | ORAL | Status: DC

## 2015-05-01 NOTE — Telephone Encounter (Signed)
See note below. Medication is listed under a different provider. Thanks!

## 2015-05-01 NOTE — Telephone Encounter (Signed)
Please refill prn 

## 2015-05-01 NOTE — Telephone Encounter (Signed)
Rx refilled per pt's request.  

## 2015-05-01 NOTE — Addendum Note (Signed)
Addended by: Moody Bruins E on: 05/01/2015 01:23 PM   Modules accepted: Orders

## 2015-05-01 NOTE — Telephone Encounter (Signed)
Can we refill his pantoprazole to [facility] on [address] quanitity 40 mg

## 2015-05-05 ENCOUNTER — Telehealth: Payer: Self-pay | Admitting: Oncology

## 2015-05-05 NOTE — Telephone Encounter (Signed)
Returned patients call to move lab earlier than appt in november

## 2015-05-14 DIAGNOSIS — B359 Dermatophytosis, unspecified: Secondary | ICD-10-CM | POA: Diagnosis not present

## 2015-05-14 DIAGNOSIS — L080 Pyoderma: Secondary | ICD-10-CM | POA: Diagnosis not present

## 2015-05-14 DIAGNOSIS — L089 Local infection of the skin and subcutaneous tissue, unspecified: Secondary | ICD-10-CM | POA: Diagnosis not present

## 2015-05-30 ENCOUNTER — Other Ambulatory Visit (HOSPITAL_BASED_OUTPATIENT_CLINIC_OR_DEPARTMENT_OTHER): Payer: Medicare Other

## 2015-05-30 DIAGNOSIS — C61 Malignant neoplasm of prostate: Secondary | ICD-10-CM | POA: Diagnosis not present

## 2015-05-30 DIAGNOSIS — Z8546 Personal history of malignant neoplasm of prostate: Secondary | ICD-10-CM

## 2015-05-30 LAB — COMPREHENSIVE METABOLIC PANEL (CC13)
ALT: 20 U/L (ref 0–55)
AST: 33 U/L (ref 5–34)
Albumin: 3.8 g/dL (ref 3.5–5.0)
Alkaline Phosphatase: 90 U/L (ref 40–150)
Anion Gap: 8 mEq/L (ref 3–11)
BILIRUBIN TOTAL: 1.15 mg/dL (ref 0.20–1.20)
BUN: 18.7 mg/dL (ref 7.0–26.0)
CHLORIDE: 111 meq/L — AB (ref 98–109)
CO2: 23 mEq/L (ref 22–29)
Calcium: 10.4 mg/dL (ref 8.4–10.4)
Creatinine: 1 mg/dL (ref 0.7–1.3)
EGFR: 67 mL/min/{1.73_m2} — ABNORMAL LOW (ref >90)
GLUCOSE: 102 mg/dL (ref 70–140)
Potassium: 4.3 mEq/L (ref 3.5–5.1)
SODIUM: 142 meq/L (ref 136–145)
Total Protein: 7.1 g/dL (ref 6.4–8.3)

## 2015-05-30 LAB — CBC WITH DIFFERENTIAL/PLATELET
BASO%: 0.2 % (ref 0.0–2.0)
Basophils Absolute: 0 10*3/uL (ref 0.0–0.1)
EOS ABS: 0.3 10*3/uL (ref 0.0–0.5)
EOS%: 4.1 % (ref 0.0–7.0)
HEMATOCRIT: 34.2 % — AB (ref 38.4–49.9)
HEMOGLOBIN: 12.1 g/dL — AB (ref 13.0–17.1)
LYMPH%: 16.5 % (ref 14.0–49.0)
MCH: 32.4 pg (ref 27.2–33.4)
MCHC: 35.4 g/dL (ref 32.0–36.0)
MCV: 91.4 fL (ref 79.3–98.0)
MONO#: 0.5 10*3/uL (ref 0.1–0.9)
MONO%: 7.8 % (ref 0.0–14.0)
NEUT#: 4.5 10*3/uL (ref 1.5–6.5)
NEUT%: 71.4 % (ref 39.0–75.0)
Platelets: 158 10*3/uL (ref 140–400)
RBC: 3.74 10*6/uL — AB (ref 4.20–5.82)
RDW: 12.9 % (ref 11.0–14.6)
WBC: 6.3 10*3/uL (ref 4.0–10.3)
lymph#: 1 10*3/uL (ref 0.9–3.3)
nRBC: 0 % (ref 0–0)

## 2015-05-31 LAB — TESTOSTERONE: TESTOSTERONE: 38 ng/dL — AB (ref 300–890)

## 2015-05-31 LAB — PSA: PSA: 1.05 ng/mL (ref <=4.00)

## 2015-06-03 ENCOUNTER — Ambulatory Visit (HOSPITAL_BASED_OUTPATIENT_CLINIC_OR_DEPARTMENT_OTHER): Payer: Medicare Other

## 2015-06-03 ENCOUNTER — Other Ambulatory Visit: Payer: Medicare Other

## 2015-06-03 ENCOUNTER — Telehealth: Payer: Self-pay | Admitting: Oncology

## 2015-06-03 ENCOUNTER — Ambulatory Visit (HOSPITAL_BASED_OUTPATIENT_CLINIC_OR_DEPARTMENT_OTHER): Payer: Medicare Other | Admitting: Oncology

## 2015-06-03 VITALS — BP 169/56 | HR 69 | Temp 97.6°F | Resp 18 | Ht 70.5 in | Wt 162.1 lb

## 2015-06-03 DIAGNOSIS — M545 Low back pain: Secondary | ICD-10-CM

## 2015-06-03 DIAGNOSIS — Z5111 Encounter for antineoplastic chemotherapy: Secondary | ICD-10-CM | POA: Diagnosis present

## 2015-06-03 DIAGNOSIS — I10 Essential (primary) hypertension: Secondary | ICD-10-CM | POA: Diagnosis not present

## 2015-06-03 DIAGNOSIS — M25551 Pain in right hip: Secondary | ICD-10-CM

## 2015-06-03 DIAGNOSIS — C61 Malignant neoplasm of prostate: Secondary | ICD-10-CM

## 2015-06-03 MED ORDER — LEUPROLIDE ACETATE (3 MONTH) 22.5 MG IM KIT
22.5000 mg | PACK | Freq: Once | INTRAMUSCULAR | Status: AC
  Administered 2015-06-03: 22.5 mg via INTRAMUSCULAR
  Filled 2015-06-03: qty 22.5

## 2015-06-03 NOTE — Progress Notes (Signed)
ID: [name]   DOB: January 07, 1921  MR#: [id number]  PJA#:[id number]  PCP: Renato Shin, MD GYN: SU:  OTHER MD: Germain Osgood [phone]); Franchot Gallo; Lavonna Monarch; Daryel November; Erskine Emery; Arloa Koh, Ileene Hutchinson [facility]  CHIEF COMPLAINT: Stage IV prostate cancer  CURRENT TREATMENT: lupron intermittently  HISTORY OF PROSTATE CANCER:  From the original intake note:  Mr. Thain tells me his PSA and prostate had been followed for a very long time.  His PSA was approximately 15 about a year ago [2007], then about 6 months ago it went to 69, which means that it had just about doubled in 6 months.  Of course, that raised red flags and the patient had his prostate biopsied at [facility] under Dr. Gareth Eagle 08/02/2006.  This showed a Gleason 9 (4+5) adenocarcinoma in 5 of the 6 cores, the other core showing a Gleason 8 (3+5).  In short, an aggressive tumor.  The patient had a bone scan 08/10/2006, which was negative, and CTs of the abdomen and pelvis 08/15/2006, which showed an enlarged prostate at 5.7 x 5.1 cm, but no evidence of adenopathy or extracapsular spread; no obvious invasion of the rectum or the bladder; and negative bone windows.  There was incidental nodularity around the left perirenal fat of unclear significance.   The patient was started on Casodex for about a month before receiving his first Lupron shot 09/05/2006.  He had a second Lupron shot 09/09/2006.  His PSA responded well, and his subsequent history is as detailed below.  INTERVAL HISTORY: Nachum returns today for followup of his prostate cancer accompanied by his wife Rod Holler. His last Lupron shot was June 2016.Marland Kitchen He is generally doing fine, looking forward to their granddaughter Marisa's upcoming wedding. Of course there are upsets at Rob's situation and wondering if it makes sense further grandchildren to go into medicine.  REVIEW OF SYSTEMS: Sterlin took [place] in at a dose to try to sleep better and it confused and ended up in  a fall. He hit his nose. He denies other trauma from that. He is obviously not taking any more benzodiazepines or similar drugs. He had an episode of bronchitis a couple months ago. That has cleared. He feels tired and takes a nap every afternoon and then has trouble going to sleep. He has not tried Benadryl. His dentures are sometimes an issue. He is having somewhat dry stools, little bit hard, although he is taking a stool softener. Of course she has urinary dribbling which is unchanged. He bruises easily. He has some back pain which is not more intense or persistent than before. Otherwise a detailed review of systems today was noncontributory and he is looking forward to his birthday next spring  Past Medical History  Diagnosis Date  . Hypertension   . Hyperlipemia   . Unspecified disorder of liver   . Cellulitis, leg     left  . Pulmonary nodule, right     2008 / unchanged CT scan, July, 2009  . Abdominal pain, left lower quadrant   . Adenocarcinoma of prostate   . Esophageal stricture   . Spinal stenosis   . Hyperglycemia   . Hyperthyroidism   . GERD (gastroesophageal reflux disease)   . Hx of colonic polyps   . Renal artery stenosis     Mild  . Atheroma     no penetrating ulcer, Abdominal aorta  . Inferior mesenteric artery injury     High-grade stenosis, CT scan, 2006  . Vertigo   .  Diverticulum   . Incisional hernia   . Rash     Ticlid  . Cough     ACE Inhibitor, tolerates ARB  . Carotid artery disease     Doppler, 11-16-2004, minimal disease  //  Doppler, May, 2012, mild increase velocities, 40-59% bilateral stenoses  . Renal cyst     Hyperdense cyst lower pole left kidney  . Dizziness   . Unsteady gait   . Sinus bradycardia   . CAD (coronary artery disease), native coronary artery     a. cath 17-Nov-2006  b. cath 10/21/2014 3v dzs w/ patent RCA stent, severe stenosis of mid LCx/OM1 s/p DES, moderately severe diffuse mid LAD treated with DES, patent prox LAD stent;  c. 01/2015 Cath:  patent LAD, LCX, OM, and RCA stents->Med Rx.  . Mild aortic stenosis     a. 09/2014 Echo: EF 65-70%, Gr 1 DD, no rwma, mild AS;  b. 01/2015 Echo: EF 65-70%, mod LVH, no rwma, 57mmHg intracavitary gradient, mildly dil LA.  Marland Kitchen Shortness of breath dyspnea    PAST SURGICAL HISTORY: Past Surgical History  Procedure Laterality Date  . Esophagogastroduodenoscopy    . Cardiac catheterization    . Incisional hernia repair    . Left heart catheterization with coronary angiogram N/A 10/21/2014    Procedure: LEFT HEART CATHETERIZATION WITH CORONARY ANGIOGRAM;  Surgeon: Sherren Mocha, MD;  Location: [facility] CATH LAB;  Service: Cardiovascular;  Laterality: N/A;  . Percutaneous coronary stent intervention (pci-s)  10/21/2014    Procedure: PERCUTANEOUS CORONARY STENT INTERVENTION (PCI-S);  Surgeon: Sherren Mocha, MD;  Location: [facility] CATH LAB;  Service: Cardiovascular;;  Mid LAD and OM1  . Sigmoid resection / rectopexy    . Cardiac catheterization N/A 02/18/2015    Procedure: Right/Left Heart Cath and Coronary Angiography;  Surgeon: Leonie Man, MD;  Location: [place] CV LAB;  Service: Cardiovascular;  Laterality: N/A;    FAMILY HISTORY Family History  Problem Relation Age of Onset  . Breast cancer Sister     2 sisters  . Pemphigus vulgaris Mother     died at 37  . Heart attack Father   . Hypertension Mother   . Hypertension Father   . Hypertension Sister   . Hypertension Brother   . Stroke Father   The patient's father died at the age of 72 from an embolus.  The patient's mother died at the age of 86 in the setting of dementia.  The patient had 1 sister die with breast cancer (neglected), 1 sister died with diabetes, and 1 brother with Parkinson's disease.  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  He has been married 60+ years to Rod Holler, who used to be a Firefighter.  They have 4 children; 2 of them are Urologists in [place] (married to twin sisters).  In particular, they  would like me to contact Aaron Edelman [phone] is the fax #) with any change in plans.  One of the sons, the oldest one, was a [place] in [place] (died in 2012-11-16 and from non-Hodgkin's lymphoma) and then the daughter, Clarise Cruz, is our Dr. Rosanna Randy wife. The patient has a total of 12 grandchildren and is waiting on great-grandchildren.   ADVANCED DIRECTIVES: in place  HEALTH MAINTENANCE: Social History  Substance Use Topics  . Smoking status: Never Smoker   . Smokeless tobacco: Never Used  . Alcohol Use: No     Colonoscopy: 11-17-2003  Bone density:  Lipid panel:  Allergies  Allergen Reactions  .  Isosorbide Other (See Comments)    "severe Headache" per patient  . Lisinopril Cough  . Ticlopidine Hcl Rash    Current Outpatient Prescriptions  Medication Sig Dispense Refill  . aspirin EC 81 MG tablet Take 81 mg by mouth daily.    . clopidogrel (PLAVIX) 75 MG tablet Take 1 tablet (75 mg total) by mouth daily with breakfast. 90 tablet 3  . colestipol (COLESTID) 1 G tablet Take 1 g by mouth daily. PT TAKES 2 TABLET BY MOUTH DAILY    . Leuprolide Acetate (LUPRON DEPOT IM) Inject into the muscle as directed. Done at [place] office approx once every 3 months    . methimazole (TAPAZOLE) 5 MG tablet TAKE 1 TABLET BY MOUTH 3 TIMES A WEEK (MON, WED, AND FRI) 15 tablet 11  . metoprolol tartrate (LOPRESSOR) 25 MG tablet Take 0.5 tablets (12.5 mg total) by mouth 2 (two) times daily. 30 tablet 6  . nitroGLYCERIN (NITROSTAT) 0.4 MG SL tablet Place 0.4 mg under the tongue every 5 (five) minutes as needed for chest pain.    . pantoprazole (PROTONIX) 40 MG tablet TAKE 1 TABLET (40 MG TOTAL) BY MOUTH DAILY. 90 tablet 2  . triamcinolone cream (KENALOG) 0.1 % Apply 1 application topically daily.   3  . zaleplon (SONATA) 10 MG capsule Take 1 capsule (10 mg total) by mouth at bedtime as needed for sleep. 30 capsule 5   No current facility-administered medications for this visit.    OBJECTIVE: Elderly white man in no  acute distress Filed Vitals:   06/03/15 1158  BP: 169/56  Pulse: 69  Temp: 97.6 F (36.4 C)  Resp: 18     Body mass index is 22.92 kg/(m^2).    ECOG FS: 1  Sclerae unicteric, pupils round and equal Oropharynx clear and moist-- no thrush or other lesions No cervical or supraclavicular adenopathy Lungs no rales or rhonchi Heart regular rate and rhythm Abd soft, nontender, positive bowel sounds MSK no focal spinal tenderness, no joint edema Neuro: nonfocal, well oriented, positive affect Skin: onychomycosis as previously noted  LAB RESULTS: Results for MAKYLE, ESLICK (MRN [id number]) as of 06/03/2015 12:24  Ref. Range 07/16/2014 10:47 09/03/2014 11:15 12/03/2014 11:14 02/05/2015 09:42 05/30/2015 10:48  PSA Latest Ref Range: <=4.00 ng/mL 0.24 0.37 0.79 0.73 1.05  Results for SELIM, DURDEN (MRN [id number]) as of 06/03/2015 12:24  Ref. Range 07/16/2014 10:47 09/03/2014 11:15 12/03/2014 11:14 02/05/2015 09:42 05/30/2015 10:48  Testosterone Latest Ref Range: 300-890 ng/dL 32 (L) 31 (L) 84 (L) 24.89 (L) 38 (L)    Lab Results  Component Value Date   WBC 6.3 05/30/2015   NEUTROABS 4.5 05/30/2015   HGB 12.1* 05/30/2015   HCT 34.2* 05/30/2015   MCV 91.4 05/30/2015   PLT 158 05/30/2015      Chemistry      Component Value Date/Time   NA 142 05/30/2015 1048   NA 137 03/03/2015 1407   K 4.3 05/30/2015 1048   K 4.4 03/03/2015 1407   CL 106 03/03/2015 1407   CL 107 01/02/2013 1059   CO2 23 05/30/2015 1048   CO2 24 03/03/2015 1407   BUN 18.7 05/30/2015 1048   BUN 30* 03/03/2015 1407   CREATININE 1.0 05/30/2015 1048   CREATININE 1.21 03/03/2015 1407   CREATININE 1.04 10/18/2014 1227      Component Value Date/Time   CALCIUM 10.4 05/30/2015 1048   CALCIUM 9.4 03/03/2015 1407   CALCIUM 10.0 01/31/2007 2246   ALKPHOS 90 05/30/2015 1048  ALKPHOS 86 02/05/2015 0942   AST 33 05/30/2015 1048   AST 42* 02/05/2015 0942   ALT 20 05/30/2015 1048   ALT 26 02/05/2015 0942   BILITOT 1.15  05/30/2015 1048   BILITOT 1.5* 02/05/2015 0942      No results found for: LABCA2  No components found for: [id number]  No results for input(s): INR in the last 168 hours.  Urinalysis    Component Value Date/Time   COLORURINE YELLOW 02/18/2015 1100   APPEARANCEUR CLEAR 02/18/2015 1100   LABSPEC 1.014 02/18/2015 1100   PHURINE 6.5 02/18/2015 1100   GLUCOSEU NEGATIVE 02/18/2015 1100   GLUCOSEU NEGATIVE 04/20/2010 1540   HGBUR NEGATIVE 02/18/2015 1100   BILIRUBINUR NEGATIVE 02/18/2015 1100   KETONESUR NEGATIVE 02/18/2015 1100   PROTEINUR NEGATIVE 02/18/2015 1100   UROBILINOGEN 1.0 02/18/2015 1100   NITRITE NEGATIVE 02/18/2015 1100   LEUKOCYTESUR NEGATIVE 02/18/2015 1100    STUDIES: CLINICAL DATA: Six days of productive cough, history of Coronary artery disease with stent placement, prostate malignancy, chronic renal insufficiency.  EXAM: CHEST 2 VIEW  COMPARISON: Chest x-ray of February 18, 2015  FINDINGS: The lungs are adequately inflated. There are coarse perihilar lung markings. The heart and pulmonary vascularity are normal. The mediastinum is normal in width. There is no pleural effusion. There is multilevel degenerative disc space narrowing of the thoracic spine.  IMPRESSION: Acute bronchitic changes. There is no pneumonia nor CHF.   Electronically Signed  By: David Martinique M.D.  On: 03/28/2015 13:29   ASSESSMENT: 81 y.o.  [place] man with a history of prostate cancer initially biopsied January 2008, Gleason 4+5, with a rapid doubling time, treated initially with Casodex and Lupron with an excellent PSA response, status post radiation to 78 Gy given with curative intent completed October 2008.  Off Lupron as of December 2008.  (1) With the eventual development of right hip pain and a rapid PSA doubling time, he resumed Lupron and Casodex September 2011 with the Casodex stopped after a month.  He receives Lupron intermittently, depending on his PSA  and testosterone levels, most recent dose 06/03/2015  PLAN:   Amer continues to do well and is remarkably stable given all the possible problems he could have. We did discuss falls issues and of course he uses a cane, which is appropriate. He is going to avoid benzodiazepines in the future. I suggested he try Tylenol PM if he really needs to take something at bedtime.  He has significant arterial calcification and I think if we try to get his systolic pressure down in [place] about amount is diastolic and he will have more problems with falls. Accordingly I making no adjustment in his medications.  We reviewed his testosterone levels which are not that remarkable, and also his PSA which is rising. It is now slightly over one so we are going to proceed to Lupron today. He generally tolerates the injections well and does not report major side effects from them. We considered a DEXA scan as we have before but at this point we are simply "keeping on what we are doing" which is what he is more comfortable with  We're going to check his labs every 3 months. He will see me again in 6. He knows to call for any problems that may develop before that visit.  Keitra Carusone C    06/03/2015

## 2015-06-03 NOTE — Telephone Encounter (Signed)
Appointments made and avs printed for pateint °

## 2015-06-09 ENCOUNTER — Telehealth: Payer: Self-pay | Admitting: Endocrinology

## 2015-06-09 NOTE — Telephone Encounter (Signed)
Pt advised of note below and voiced understanding.  

## 2015-06-09 NOTE — Telephone Encounter (Signed)
Patient is returning your call, he also stated that he has blood in his urine.

## 2015-06-09 NOTE — Telephone Encounter (Signed)
Yes, it is possible that the plavix could cause. i am happy to help in any way, but the decision to continue or d/c is up to cardiol and urol

## 2015-06-09 NOTE — Telephone Encounter (Signed)
I contacted the pt's wife. She stated the pt was advised by their son who is a urologist the blood in his urine could be coming from the plavix he has been taking. Pt is also in contact with his urologist try to get an appointment to discuss this with them. Patient wanted to know your thoughts on what could be causing the blood he has had in his urine.  Please advise, Thanks!

## 2015-06-11 DIAGNOSIS — C61 Malignant neoplasm of prostate: Secondary | ICD-10-CM | POA: Diagnosis not present

## 2015-06-11 DIAGNOSIS — B351 Tinea unguium: Secondary | ICD-10-CM | POA: Diagnosis not present

## 2015-06-11 DIAGNOSIS — L03019 Cellulitis of unspecified finger: Secondary | ICD-10-CM | POA: Diagnosis not present

## 2015-06-11 DIAGNOSIS — R310 Gross hematuria: Secondary | ICD-10-CM | POA: Diagnosis not present

## 2015-06-13 DIAGNOSIS — N3040 Irradiation cystitis without hematuria: Secondary | ICD-10-CM | POA: Diagnosis not present

## 2015-06-13 DIAGNOSIS — R310 Gross hematuria: Secondary | ICD-10-CM | POA: Diagnosis not present

## 2015-06-22 DIAGNOSIS — I10 Essential (primary) hypertension: Secondary | ICD-10-CM | POA: Diagnosis not present

## 2015-06-22 DIAGNOSIS — Z923 Personal history of irradiation: Secondary | ICD-10-CM | POA: Diagnosis not present

## 2015-06-22 DIAGNOSIS — R3129 Other microscopic hematuria: Secondary | ICD-10-CM | POA: Diagnosis not present

## 2015-06-22 DIAGNOSIS — R531 Weakness: Secondary | ICD-10-CM | POA: Diagnosis not present

## 2015-06-22 DIAGNOSIS — Z7982 Long term (current) use of aspirin: Secondary | ICD-10-CM | POA: Diagnosis not present

## 2015-06-22 DIAGNOSIS — R197 Diarrhea, unspecified: Secondary | ICD-10-CM | POA: Diagnosis not present

## 2015-06-22 DIAGNOSIS — R740 Nonspecific elevation of levels of transaminase and lactic acid dehydrogenase [LDH]: Secondary | ICD-10-CM | POA: Diagnosis not present

## 2015-06-22 DIAGNOSIS — I2510 Atherosclerotic heart disease of native coronary artery without angina pectoris: Secondary | ICD-10-CM | POA: Diagnosis not present

## 2015-06-22 DIAGNOSIS — D649 Anemia, unspecified: Secondary | ICD-10-CM | POA: Diagnosis not present

## 2015-06-22 DIAGNOSIS — R0600 Dyspnea, unspecified: Secondary | ICD-10-CM | POA: Diagnosis not present

## 2015-06-22 DIAGNOSIS — R269 Unspecified abnormalities of gait and mobility: Secondary | ICD-10-CM | POA: Diagnosis not present

## 2015-06-22 DIAGNOSIS — E860 Dehydration: Secondary | ICD-10-CM | POA: Diagnosis not present

## 2015-06-22 DIAGNOSIS — Z8546 Personal history of malignant neoplasm of prostate: Secondary | ICD-10-CM | POA: Diagnosis not present

## 2015-06-22 DIAGNOSIS — Z955 Presence of coronary angioplasty implant and graft: Secondary | ICD-10-CM | POA: Diagnosis not present

## 2015-06-23 ENCOUNTER — Telehealth: Payer: Self-pay | Admitting: Endocrinology

## 2015-06-23 DIAGNOSIS — D649 Anemia, unspecified: Secondary | ICD-10-CM | POA: Diagnosis not present

## 2015-06-23 DIAGNOSIS — E860 Dehydration: Secondary | ICD-10-CM | POA: Diagnosis not present

## 2015-06-23 DIAGNOSIS — R197 Diarrhea, unspecified: Secondary | ICD-10-CM | POA: Diagnosis not present

## 2015-06-23 NOTE — Telephone Encounter (Signed)
Ms Kurt Elliott letting Dr. Loanne Drilling know that Pt is in the hospital in [place], was very lethargic, dehydrated.

## 2015-06-23 NOTE — Telephone Encounter (Signed)
See note below to be advised. 

## 2015-06-26 DIAGNOSIS — R3129 Other microscopic hematuria: Secondary | ICD-10-CM | POA: Diagnosis not present

## 2015-06-26 DIAGNOSIS — M4800 Spinal stenosis, site unspecified: Secondary | ICD-10-CM | POA: Diagnosis not present

## 2015-06-26 DIAGNOSIS — I2510 Atherosclerotic heart disease of native coronary artery without angina pectoris: Secondary | ICD-10-CM | POA: Diagnosis not present

## 2015-06-26 DIAGNOSIS — C61 Malignant neoplasm of prostate: Secondary | ICD-10-CM | POA: Diagnosis not present

## 2015-06-26 DIAGNOSIS — D649 Anemia, unspecified: Secondary | ICD-10-CM | POA: Diagnosis not present

## 2015-06-26 DIAGNOSIS — R531 Weakness: Secondary | ICD-10-CM | POA: Diagnosis not present

## 2015-06-30 DIAGNOSIS — M4800 Spinal stenosis, site unspecified: Secondary | ICD-10-CM | POA: Diagnosis not present

## 2015-06-30 DIAGNOSIS — C61 Malignant neoplasm of prostate: Secondary | ICD-10-CM | POA: Diagnosis not present

## 2015-06-30 DIAGNOSIS — R531 Weakness: Secondary | ICD-10-CM | POA: Diagnosis not present

## 2015-06-30 DIAGNOSIS — D649 Anemia, unspecified: Secondary | ICD-10-CM | POA: Diagnosis not present

## 2015-06-30 DIAGNOSIS — I2510 Atherosclerotic heart disease of native coronary artery without angina pectoris: Secondary | ICD-10-CM | POA: Diagnosis not present

## 2015-06-30 DIAGNOSIS — R3129 Other microscopic hematuria: Secondary | ICD-10-CM | POA: Diagnosis not present

## 2015-07-01 ENCOUNTER — Ambulatory Visit (INDEPENDENT_AMBULATORY_CARE_PROVIDER_SITE_OTHER): Payer: Medicare Other | Admitting: Endocrinology

## 2015-07-01 ENCOUNTER — Encounter: Payer: Self-pay | Admitting: Endocrinology

## 2015-07-01 ENCOUNTER — Other Ambulatory Visit: Payer: Medicare Other

## 2015-07-01 ENCOUNTER — Ambulatory Visit: Payer: Medicare Other | Admitting: Oncology

## 2015-07-01 VITALS — BP 128/70 | HR 73 | Temp 97.9°F | Ht 70.5 in | Wt 159.0 lb

## 2015-07-01 DIAGNOSIS — I208 Other forms of angina pectoris: Secondary | ICD-10-CM

## 2015-07-01 DIAGNOSIS — E0590 Thyrotoxicosis, unspecified without thyrotoxic crisis or storm: Secondary | ICD-10-CM

## 2015-07-01 MED ORDER — CEPHALEXIN 250 MG PO CAPS: 250.0000 mg | ORAL_CAPSULE | Freq: Three times a day (TID) | ORAL | Status: DC

## 2015-07-01 MED ORDER — METHIMAZOLE 5 MG PO TABS: ORAL_TABLET | ORAL | Status: DC

## 2015-07-01 MED ORDER — HYOSCYAMINE SULFATE 0.125 MG PO TABS: 0.1250 mg | ORAL_TABLET | Freq: Every day | ORAL | Status: DC

## 2015-07-01 NOTE — Patient Instructions (Addendum)
Please continue the same methimazole. if ever you have fever while taking methimazole, stop it and call us, even if the reason is obvious, because of the risk of a rare side-effect. i have sent 2 prescriptions to your pharmacy, for a pill for the urinary urgency, and the antibiotic.   If your finger is not better by next week, please call back, so I can refer you to a specialist.  Please call sooner if you get worse. Take miralax, once per day.   Please come back for a follow-up appointment in 2 months.

## 2015-07-01 NOTE — Progress Notes (Signed)
Subjective:    Patient ID: Kurt Elliott, male    DOB: [birth year], 81 y.o.   MRN: [id number]  HPI Pt was recently hospitalized in [place] for diarrhea and hypovolemia.  Since then, he feels better in general. He has sxs of urinary urgency.  He has frequent voiding of small volumes.   He has 6 mos of irritation of the right middle fingernail.  bx at derm was benign.  Past Medical History  Diagnosis Date  . Hypertension   . Hyperlipemia   . Unspecified disorder of liver   . Cellulitis, leg     left  . Pulmonary nodule, right     2008 / unchanged CT scan, July, 2009  . Abdominal pain, left lower quadrant   . Adenocarcinoma of prostate ([place])   . Esophageal stricture   . Spinal stenosis   . Hyperglycemia   . Hyperthyroidism   . GERD (gastroesophageal reflux disease)   . Hx of colonic polyps   . Renal artery stenosis (H[facility])     Mild  . Atheroma     no penetrating ulcer, Abdominal aorta  . Inferior mesenteric artery injury     High-grade stenosis, CT scan, 2006  . Vertigo   . Diverticulum   . Incisional hernia   . Rash     Ticlid  . Cough     ACE Inhibitor, tolerates ARB  . Carotid artery disease ([place])     Doppler, 2006, minimal disease  //  Doppler, May, 2012, mild increase velocities, 40-59% bilateral stenoses  . Renal cyst     Hyperdense cyst lower pole left kidney  . Dizziness   . Unsteady gait   . Sinus bradycardia   . CAD (coronary artery disease), native coronary artery     a. cath 2008  b. cath 10/21/2014 3v dzs w/ patent RCA stent, severe stenosis of mid LCx/OM1 s/p DES, moderately severe diffuse mid LAD treated with DES, patent prox LAD stent;  c. 01/2015 Cath: patent LAD, LCX, OM, and RCA stents->Med Rx.  . Mild aortic stenosis     a. 09/2014 Echo: EF 65-70%, Gr 1 DD, no rwma, mild AS;  b. 01/2015 Echo: EF 65-70%, mod LVH, no rwma, 57mmHg intracavitary gradient, mildly dil LA.  Marland Kitchen Shortness of breath dyspnea     Past Surgical History  Procedure Laterality Date    . Esophagogastroduodenoscopy    . Cardiac catheterization    . Incisional hernia repair    . Left heart catheterization with coronary angiogram N/A 10/21/2014    Procedure: LEFT HEART CATHETERIZATION WITH CORONARY ANGIOGRAM;  Surgeon: Sherren Mocha, MD;  Location: [facility] CATH LAB;  Service: Cardiovascular;  Laterality: N/A;  . Percutaneous coronary stent intervention (pci-s)  10/21/2014    Procedure: PERCUTANEOUS CORONARY STENT INTERVENTION (PCI-S);  Surgeon: Sherren Mocha, MD;  Location: [facility] CATH LAB;  Service: Cardiovascular;;  Mid LAD and OM1  . Sigmoid resection / rectopexy    . Cardiac catheterization N/A 02/18/2015    Procedure: Right/Left Heart Cath and Coronary Angiography;  Surgeon: Leonie Man, MD;  Location: [place] CV LAB;  Service: Cardiovascular;  Laterality: N/A;    Social History   Social History  . Marital Status: Married    Spouse Name: N/A  . Number of Children: 4  . Years of Education: N/A   Occupational History  . 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 Smoking status: Never Smoker   . Smokeless tobacco: Never Used  . Alcohol  Use: No  . Drug Use: No  . Sexual Activity: Not on file   Other Topics Concern  . Not on file   Social History Narrative    Current Outpatient Prescriptions on File Prior to Visit  Medication Sig Dispense Refill  . aspirin EC 81 MG tablet Take 81 mg by mouth daily.    . clopidogrel (PLAVIX) 75 MG tablet Take 1 tablet (75 mg total) by mouth daily with breakfast. 90 tablet 3  . colestipol (COLESTID) 1 G tablet Take 1 g by mouth daily. PT TAKES 2 TABLET BY MOUTH DAILY    . Leuprolide Acetate (LUPRON DEPOT IM) Inject into the muscle as directed. Done at [place] office approx once every 3 months    . metoprolol tartrate (LOPRESSOR) 25 MG tablet Take 0.5 tablets (12.5 mg total) by mouth 2 (two) times daily. 30 tablet 6  . nitroGLYCERIN (NITROSTAT) 0.4 MG SL tablet Place 0.4 mg under the tongue every 5 (five)  minutes as needed for chest pain.    . pantoprazole (PROTONIX) 40 MG tablet TAKE 1 TABLET (40 MG TOTAL) BY MOUTH DAILY. 90 tablet 2  . triamcinolone cream (KENALOG) 0.1 % Apply 1 application topically daily.   3   No current facility-administered medications on file prior to visit.    Allergies  Allergen Reactions  . Isosorbide Other (See Comments)    "severe Headache" per patient  . Lisinopril Cough  . Ticlopidine Hcl Rash    Family History  Problem Relation Age of Onset  . Breast cancer Sister     2 sisters  . Pemphigus vulgaris Mother     died at 43  . Heart attack Father   . Hypertension Mother   . Hypertension Father   . Hypertension Sister   . Hypertension Brother   . Stroke Father     BP 128/70 mmHg  Pulse 73  Temp(Src) 97.9 F (36.6 C) (Oral)  Ht 5' 10.5" (1.791 m)  Wt 159 lb (72.122 kg)  BMI 22.48 kg/m2  SpO2 97%  Review of Systems Denies fever, but fatigue persists.       Objective:   Physical Exam VITAL SIGNS:  See vs page GENERAL: no distress.   Right middle fingernail: few mm ares of pus under the nail, with severe eczematous rash of all fingers, distal to the dip's Gait: needs to be assisted, even with a cane.     I have reviewed outside records, and summarized: Pt was admitted with severe weakness and hypovolemia He was rx'ed with IVF   outside test results are reviewed: Hb=10.6 Creat=normal TSH=normal  (I discussed with pt's son, who is a urologist)    Assessment & Plan:  Anemia, mild Hyperthyroidism: well-controlled Urinary urgency: new to me. subnychial infection, new.  Alternating constipation and diarrhea.  I told his it is better to rx with miralax than MOM.  Patient is advised the following: Patient Instructions  Please continue the same methimazole. if ever you have fever while taking methimazole, stop it and call us, even if the reason is obvious, because of the risk of a rare side-effect. i have sent 2 prescriptions to  your pharmacy, for a pill for the urinary urgency, and the antibiotic.   If your finger is not better by next week, please call back, so I can refer you to a specialist.  Please call sooner if you get worse. Take miralax, once per day.   Please come back for a follow-up appointment in 2 months.

## 2015-07-02 DIAGNOSIS — R3129 Other microscopic hematuria: Secondary | ICD-10-CM | POA: Diagnosis not present

## 2015-07-02 DIAGNOSIS — R531 Weakness: Secondary | ICD-10-CM | POA: Diagnosis not present

## 2015-07-02 DIAGNOSIS — C61 Malignant neoplasm of prostate: Secondary | ICD-10-CM | POA: Diagnosis not present

## 2015-07-02 DIAGNOSIS — D649 Anemia, unspecified: Secondary | ICD-10-CM | POA: Diagnosis not present

## 2015-07-02 DIAGNOSIS — M4800 Spinal stenosis, site unspecified: Secondary | ICD-10-CM | POA: Diagnosis not present

## 2015-07-02 DIAGNOSIS — I2510 Atherosclerotic heart disease of native coronary artery without angina pectoris: Secondary | ICD-10-CM | POA: Diagnosis not present

## 2015-07-03 ENCOUNTER — Telehealth: Payer: Self-pay | Admitting: Endocrinology

## 2015-07-03 DIAGNOSIS — D649 Anemia, unspecified: Secondary | ICD-10-CM | POA: Diagnosis not present

## 2015-07-03 DIAGNOSIS — I2510 Atherosclerotic heart disease of native coronary artery without angina pectoris: Secondary | ICD-10-CM | POA: Diagnosis not present

## 2015-07-03 DIAGNOSIS — R531 Weakness: Secondary | ICD-10-CM | POA: Diagnosis not present

## 2015-07-03 DIAGNOSIS — R3129 Other microscopic hematuria: Secondary | ICD-10-CM | POA: Diagnosis not present

## 2015-07-03 DIAGNOSIS — C61 Malignant neoplasm of prostate: Secondary | ICD-10-CM | POA: Diagnosis not present

## 2015-07-03 DIAGNOSIS — M4800 Spinal stenosis, site unspecified: Secondary | ICD-10-CM | POA: Diagnosis not present

## 2015-07-03 NOTE — Telephone Encounter (Signed)
Kurt Elliott with [place] # [phone] for verbal approval  Calling to let MD know she saw him for PT eval, will visit 2 times a week for one wk, 3 times week for 2 wks, 2 times a wk for 3 wks

## 2015-07-03 NOTE — Telephone Encounter (Signed)
ok 

## 2015-07-03 NOTE — Telephone Encounter (Signed)
See note below and please advise, Thanks! 

## 2015-07-03 NOTE — Telephone Encounter (Signed)
Contacted Gracie with [place] and left a voicemail advising of verbal ok.

## 2015-07-04 DIAGNOSIS — C61 Malignant neoplasm of prostate: Secondary | ICD-10-CM | POA: Diagnosis not present

## 2015-07-04 DIAGNOSIS — M4800 Spinal stenosis, site unspecified: Secondary | ICD-10-CM | POA: Diagnosis not present

## 2015-07-04 DIAGNOSIS — D649 Anemia, unspecified: Secondary | ICD-10-CM | POA: Diagnosis not present

## 2015-07-04 DIAGNOSIS — R531 Weakness: Secondary | ICD-10-CM | POA: Diagnosis not present

## 2015-07-04 DIAGNOSIS — I2510 Atherosclerotic heart disease of native coronary artery without angina pectoris: Secondary | ICD-10-CM | POA: Diagnosis not present

## 2015-07-04 DIAGNOSIS — R3129 Other microscopic hematuria: Secondary | ICD-10-CM | POA: Diagnosis not present

## 2015-07-08 DIAGNOSIS — R531 Weakness: Secondary | ICD-10-CM | POA: Diagnosis not present

## 2015-07-08 DIAGNOSIS — C61 Malignant neoplasm of prostate: Secondary | ICD-10-CM | POA: Diagnosis not present

## 2015-07-08 DIAGNOSIS — I2510 Atherosclerotic heart disease of native coronary artery without angina pectoris: Secondary | ICD-10-CM | POA: Diagnosis not present

## 2015-07-08 DIAGNOSIS — D649 Anemia, unspecified: Secondary | ICD-10-CM | POA: Diagnosis not present

## 2015-07-08 DIAGNOSIS — M4800 Spinal stenosis, site unspecified: Secondary | ICD-10-CM | POA: Diagnosis not present

## 2015-07-08 DIAGNOSIS — R3129 Other microscopic hematuria: Secondary | ICD-10-CM | POA: Diagnosis not present

## 2015-07-09 DIAGNOSIS — I2510 Atherosclerotic heart disease of native coronary artery without angina pectoris: Secondary | ICD-10-CM | POA: Diagnosis not present

## 2015-07-09 DIAGNOSIS — R3129 Other microscopic hematuria: Secondary | ICD-10-CM | POA: Diagnosis not present

## 2015-07-09 DIAGNOSIS — D649 Anemia, unspecified: Secondary | ICD-10-CM | POA: Diagnosis not present

## 2015-07-09 DIAGNOSIS — R531 Weakness: Secondary | ICD-10-CM | POA: Diagnosis not present

## 2015-07-09 DIAGNOSIS — M4800 Spinal stenosis, site unspecified: Secondary | ICD-10-CM | POA: Diagnosis not present

## 2015-07-09 DIAGNOSIS — C61 Malignant neoplasm of prostate: Secondary | ICD-10-CM | POA: Diagnosis not present

## 2015-07-10 DIAGNOSIS — C61 Malignant neoplasm of prostate: Secondary | ICD-10-CM | POA: Diagnosis not present

## 2015-07-10 DIAGNOSIS — R3129 Other microscopic hematuria: Secondary | ICD-10-CM | POA: Diagnosis not present

## 2015-07-10 DIAGNOSIS — I2510 Atherosclerotic heart disease of native coronary artery without angina pectoris: Secondary | ICD-10-CM | POA: Diagnosis not present

## 2015-07-10 DIAGNOSIS — M4800 Spinal stenosis, site unspecified: Secondary | ICD-10-CM | POA: Diagnosis not present

## 2015-07-10 DIAGNOSIS — D649 Anemia, unspecified: Secondary | ICD-10-CM | POA: Diagnosis not present

## 2015-07-10 DIAGNOSIS — R531 Weakness: Secondary | ICD-10-CM | POA: Diagnosis not present

## 2015-07-11 DIAGNOSIS — R531 Weakness: Secondary | ICD-10-CM | POA: Diagnosis not present

## 2015-07-11 DIAGNOSIS — I2510 Atherosclerotic heart disease of native coronary artery without angina pectoris: Secondary | ICD-10-CM | POA: Diagnosis not present

## 2015-07-11 DIAGNOSIS — C61 Malignant neoplasm of prostate: Secondary | ICD-10-CM | POA: Diagnosis not present

## 2015-07-11 DIAGNOSIS — M4800 Spinal stenosis, site unspecified: Secondary | ICD-10-CM | POA: Diagnosis not present

## 2015-07-11 DIAGNOSIS — D649 Anemia, unspecified: Secondary | ICD-10-CM | POA: Diagnosis not present

## 2015-07-11 DIAGNOSIS — R3129 Other microscopic hematuria: Secondary | ICD-10-CM | POA: Diagnosis not present

## 2015-07-14 DIAGNOSIS — R531 Weakness: Secondary | ICD-10-CM | POA: Diagnosis not present

## 2015-07-14 DIAGNOSIS — D649 Anemia, unspecified: Secondary | ICD-10-CM | POA: Diagnosis not present

## 2015-07-14 DIAGNOSIS — R3129 Other microscopic hematuria: Secondary | ICD-10-CM | POA: Diagnosis not present

## 2015-07-14 DIAGNOSIS — M4800 Spinal stenosis, site unspecified: Secondary | ICD-10-CM | POA: Diagnosis not present

## 2015-07-14 DIAGNOSIS — C61 Malignant neoplasm of prostate: Secondary | ICD-10-CM | POA: Diagnosis not present

## 2015-07-14 DIAGNOSIS — I2510 Atherosclerotic heart disease of native coronary artery without angina pectoris: Secondary | ICD-10-CM | POA: Diagnosis not present

## 2015-07-15 DIAGNOSIS — I2510 Atherosclerotic heart disease of native coronary artery without angina pectoris: Secondary | ICD-10-CM | POA: Diagnosis not present

## 2015-07-15 DIAGNOSIS — R3129 Other microscopic hematuria: Secondary | ICD-10-CM | POA: Diagnosis not present

## 2015-07-15 DIAGNOSIS — D649 Anemia, unspecified: Secondary | ICD-10-CM | POA: Diagnosis not present

## 2015-07-15 DIAGNOSIS — M4800 Spinal stenosis, site unspecified: Secondary | ICD-10-CM | POA: Diagnosis not present

## 2015-07-15 DIAGNOSIS — R531 Weakness: Secondary | ICD-10-CM | POA: Diagnosis not present

## 2015-07-15 DIAGNOSIS — C61 Malignant neoplasm of prostate: Secondary | ICD-10-CM | POA: Diagnosis not present

## 2015-07-16 DIAGNOSIS — I2510 Atherosclerotic heart disease of native coronary artery without angina pectoris: Secondary | ICD-10-CM | POA: Diagnosis not present

## 2015-07-16 DIAGNOSIS — R3129 Other microscopic hematuria: Secondary | ICD-10-CM | POA: Diagnosis not present

## 2015-07-16 DIAGNOSIS — C61 Malignant neoplasm of prostate: Secondary | ICD-10-CM | POA: Diagnosis not present

## 2015-07-16 DIAGNOSIS — D649 Anemia, unspecified: Secondary | ICD-10-CM | POA: Diagnosis not present

## 2015-07-16 DIAGNOSIS — M4800 Spinal stenosis, site unspecified: Secondary | ICD-10-CM | POA: Diagnosis not present

## 2015-07-16 DIAGNOSIS — R531 Weakness: Secondary | ICD-10-CM | POA: Diagnosis not present

## 2015-07-27 DIAGNOSIS — M6281 Muscle weakness (generalized): Secondary | ICD-10-CM | POA: Diagnosis not present

## 2015-07-31 ENCOUNTER — Other Ambulatory Visit: Payer: Self-pay | Admitting: Endocrinology

## 2015-07-31 MED ORDER — PANTOPRAZOLE SODIUM 40 MG PO TBEC: DELAYED_RELEASE_TABLET | ORAL | Status: DC

## 2015-08-01 DIAGNOSIS — C61 Malignant neoplasm of prostate: Secondary | ICD-10-CM | POA: Diagnosis not present

## 2015-08-01 DIAGNOSIS — R531 Weakness: Secondary | ICD-10-CM | POA: Diagnosis not present

## 2015-08-01 DIAGNOSIS — R3129 Other microscopic hematuria: Secondary | ICD-10-CM | POA: Diagnosis not present

## 2015-08-01 DIAGNOSIS — M4800 Spinal stenosis, site unspecified: Secondary | ICD-10-CM | POA: Diagnosis not present

## 2015-08-01 DIAGNOSIS — I2510 Atherosclerotic heart disease of native coronary artery without angina pectoris: Secondary | ICD-10-CM | POA: Diagnosis not present

## 2015-08-01 DIAGNOSIS — D649 Anemia, unspecified: Secondary | ICD-10-CM | POA: Diagnosis not present

## 2015-08-06 DIAGNOSIS — C61 Malignant neoplasm of prostate: Secondary | ICD-10-CM | POA: Diagnosis not present

## 2015-08-06 DIAGNOSIS — D649 Anemia, unspecified: Secondary | ICD-10-CM | POA: Diagnosis not present

## 2015-08-06 DIAGNOSIS — R531 Weakness: Secondary | ICD-10-CM | POA: Diagnosis not present

## 2015-08-06 DIAGNOSIS — M4800 Spinal stenosis, site unspecified: Secondary | ICD-10-CM | POA: Diagnosis not present

## 2015-08-06 DIAGNOSIS — I2510 Atherosclerotic heart disease of native coronary artery without angina pectoris: Secondary | ICD-10-CM | POA: Diagnosis not present

## 2015-08-06 DIAGNOSIS — R3129 Other microscopic hematuria: Secondary | ICD-10-CM | POA: Diagnosis not present

## 2015-08-07 ENCOUNTER — Telehealth: Payer: Self-pay | Admitting: Endocrinology

## 2015-08-07 NOTE — Telephone Encounter (Signed)
Arville Go calling to request approval for her to continue to see pt for home health PT 2 times a week for 3 more weeks please [phone] [facility]

## 2015-08-07 NOTE — Telephone Encounter (Signed)
See note below and please advise, Thanks! 

## 2015-08-07 NOTE — Telephone Encounter (Signed)
Arville Go advised of verbal ok.

## 2015-08-07 NOTE — Telephone Encounter (Signed)
ok 

## 2015-08-08 DIAGNOSIS — C61 Malignant neoplasm of prostate: Secondary | ICD-10-CM | POA: Diagnosis not present

## 2015-08-08 DIAGNOSIS — M4800 Spinal stenosis, site unspecified: Secondary | ICD-10-CM | POA: Diagnosis not present

## 2015-08-08 DIAGNOSIS — R531 Weakness: Secondary | ICD-10-CM | POA: Diagnosis not present

## 2015-08-08 DIAGNOSIS — D649 Anemia, unspecified: Secondary | ICD-10-CM | POA: Diagnosis not present

## 2015-08-08 DIAGNOSIS — I2510 Atherosclerotic heart disease of native coronary artery without angina pectoris: Secondary | ICD-10-CM | POA: Diagnosis not present

## 2015-08-08 DIAGNOSIS — R3129 Other microscopic hematuria: Secondary | ICD-10-CM | POA: Diagnosis not present

## 2015-08-11 DIAGNOSIS — R3129 Other microscopic hematuria: Secondary | ICD-10-CM | POA: Diagnosis not present

## 2015-08-11 DIAGNOSIS — R531 Weakness: Secondary | ICD-10-CM | POA: Diagnosis not present

## 2015-08-11 DIAGNOSIS — M4800 Spinal stenosis, site unspecified: Secondary | ICD-10-CM | POA: Diagnosis not present

## 2015-08-11 DIAGNOSIS — C61 Malignant neoplasm of prostate: Secondary | ICD-10-CM | POA: Diagnosis not present

## 2015-08-11 DIAGNOSIS — I2510 Atherosclerotic heart disease of native coronary artery without angina pectoris: Secondary | ICD-10-CM | POA: Diagnosis not present

## 2015-08-11 DIAGNOSIS — D649 Anemia, unspecified: Secondary | ICD-10-CM | POA: Diagnosis not present

## 2015-08-14 DIAGNOSIS — M4800 Spinal stenosis, site unspecified: Secondary | ICD-10-CM | POA: Diagnosis not present

## 2015-08-14 DIAGNOSIS — R3129 Other microscopic hematuria: Secondary | ICD-10-CM | POA: Diagnosis not present

## 2015-08-14 DIAGNOSIS — C61 Malignant neoplasm of prostate: Secondary | ICD-10-CM | POA: Diagnosis not present

## 2015-08-14 DIAGNOSIS — I2510 Atherosclerotic heart disease of native coronary artery without angina pectoris: Secondary | ICD-10-CM | POA: Diagnosis not present

## 2015-08-14 DIAGNOSIS — D649 Anemia, unspecified: Secondary | ICD-10-CM | POA: Diagnosis not present

## 2015-08-14 DIAGNOSIS — R531 Weakness: Secondary | ICD-10-CM | POA: Diagnosis not present

## 2015-08-17 ENCOUNTER — Other Ambulatory Visit: Payer: Self-pay | Admitting: Cardiology

## 2015-08-18 DIAGNOSIS — R531 Weakness: Secondary | ICD-10-CM | POA: Diagnosis not present

## 2015-08-18 DIAGNOSIS — D649 Anemia, unspecified: Secondary | ICD-10-CM | POA: Diagnosis not present

## 2015-08-18 DIAGNOSIS — R3129 Other microscopic hematuria: Secondary | ICD-10-CM | POA: Diagnosis not present

## 2015-08-18 DIAGNOSIS — C61 Malignant neoplasm of prostate: Secondary | ICD-10-CM | POA: Diagnosis not present

## 2015-08-18 DIAGNOSIS — I2510 Atherosclerotic heart disease of native coronary artery without angina pectoris: Secondary | ICD-10-CM | POA: Diagnosis not present

## 2015-08-18 DIAGNOSIS — M4800 Spinal stenosis, site unspecified: Secondary | ICD-10-CM | POA: Diagnosis not present

## 2015-08-20 DIAGNOSIS — R531 Weakness: Secondary | ICD-10-CM | POA: Diagnosis not present

## 2015-08-20 DIAGNOSIS — R3129 Other microscopic hematuria: Secondary | ICD-10-CM | POA: Diagnosis not present

## 2015-08-20 DIAGNOSIS — M4800 Spinal stenosis, site unspecified: Secondary | ICD-10-CM | POA: Diagnosis not present

## 2015-08-20 DIAGNOSIS — D649 Anemia, unspecified: Secondary | ICD-10-CM | POA: Diagnosis not present

## 2015-08-20 DIAGNOSIS — I2510 Atherosclerotic heart disease of native coronary artery without angina pectoris: Secondary | ICD-10-CM | POA: Diagnosis not present

## 2015-08-20 DIAGNOSIS — C61 Malignant neoplasm of prostate: Secondary | ICD-10-CM | POA: Diagnosis not present

## 2015-08-21 DIAGNOSIS — H903 Sensorineural hearing loss, bilateral: Secondary | ICD-10-CM | POA: Diagnosis not present

## 2015-08-31 ENCOUNTER — Other Ambulatory Visit: Payer: Self-pay | Admitting: Endocrinology

## 2015-09-01 ENCOUNTER — Encounter: Payer: Self-pay | Admitting: Endocrinology

## 2015-09-01 ENCOUNTER — Ambulatory Visit (INDEPENDENT_AMBULATORY_CARE_PROVIDER_SITE_OTHER): Payer: Medicare Other | Admitting: Endocrinology

## 2015-09-01 VITALS — BP 130/62 | HR 71 | Temp 97.5°F | Ht 70.5 in | Wt 166.0 lb

## 2015-09-01 DIAGNOSIS — R3915 Urgency of urination: Secondary | ICD-10-CM

## 2015-09-01 MED ORDER — METHIMAZOLE 5 MG PO TABS: ORAL_TABLET | ORAL | Status: DC

## 2015-09-01 NOTE — Progress Notes (Signed)
Subjective:    Patient ID: Kurt Elliott, male    DOB: October 21, 1920, 82 y.o.   MRN: [id number]  HPI The state of at least three ongoing medical problems is addressed today, with interval history of each noted here: Hyperthyroidism: no change in chronic tremor. Urinary urgency: pt says is much better. Paronychial infection: right middle finger is much better.   Past Medical History  Diagnosis Date  . Hypertension   . Hyperlipemia   . Unspecified disorder of liver   . Cellulitis, leg     left  . Pulmonary nodule, right     2008 / unchanged CT scan, July, 2009  . Abdominal pain, left lower quadrant   . Adenocarcinoma of prostate ([place])   . Esophageal stricture   . Spinal stenosis   . Hyperglycemia   . Hyperthyroidism   . GERD (gastroesophageal reflux disease)   . Hx of colonic polyps   . Renal artery stenosis (HCC)     Mild  . Atheroma     no penetrating ulcer, Abdominal aorta  . Inferior mesenteric artery injury     High-grade stenosis, CT scan, 2006  . Vertigo   . Diverticulum   . Incisional hernia   . Rash     Ticlid  . Cough     ACE Inhibitor, tolerates ARB  . Carotid artery disease ([place])     Doppler, 2006, minimal disease  //  Doppler, May, 2012, mild increase velocities, 40-59% bilateral stenoses  . Renal cyst     Hyperdense cyst lower pole left kidney  . Dizziness   . Unsteady gait   . Sinus bradycardia   . CAD (coronary artery disease), native coronary artery     a. cath 2008  b. cath 10/21/2014 3v dzs w/ patent RCA stent, severe stenosis of mid LCx/OM1 s/p DES, moderately severe diffuse mid LAD treated with DES, patent prox LAD stent;  c. 01/2015 Cath: patent LAD, LCX, OM, and RCA stents->Med Rx.  . Mild aortic stenosis     a. 09/2014 Echo: EF 65-70%, Gr 1 DD, no rwma, mild AS;  b. 01/2015 Echo: EF 65-70%, mod LVH, no rwma, 87mmHg intracavitary gradient, mildly dil LA.  Marland Kitchen Shortness of breath dyspnea     Past Surgical History  Procedure Laterality Date  .  Esophagogastroduodenoscopy    . Cardiac catheterization    . Incisional hernia repair    . Left heart catheterization with coronary angiogram N/A 10/21/2014    Procedure: LEFT HEART CATHETERIZATION WITH CORONARY ANGIOGRAM;  Surgeon: Sherren Mocha, MD;  Location: [facility] CATH LAB;  Service: Cardiovascular;  Laterality: N/A;  . Percutaneous coronary stent intervention (pci-s)  10/21/2014    Procedure: PERCUTANEOUS CORONARY STENT INTERVENTION (PCI-S);  Surgeon: Sherren Mocha, MD;  Location: [facility] CATH LAB;  Service: Cardiovascular;;  Mid LAD and OM1  . Sigmoid resection / rectopexy    . Cardiac catheterization N/A 02/18/2015    Procedure: Right/Left Heart Cath and Coronary Angiography;  Surgeon: Leonie Man, MD;  Location: [facility] CV LAB;  Service: Cardiovascular;  Laterality: N/A;    Social History   Social History  . Marital Status: Married    Spouse Name: N/A  . Number of Children: 4  . Years of Education: N/A   Occupational History  . 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 Smoking status: Never Smoker   . Smokeless tobacco: Never Used  . Alcohol Use: No  . Drug Use: No  . Sexual Activity:  Not on file   Other Topics Concern  . Not on file   Social History Narrative    Current Outpatient Prescriptions on File Prior to Visit  Medication Sig Dispense Refill  . aspirin EC 81 MG tablet Take 81 mg by mouth daily.    . clopidogrel (PLAVIX) 75 MG tablet Take 1 tablet (75 mg total) by mouth daily with breakfast. 90 tablet 3  . colestipol (COLESTID) 1 G tablet Take 2 g by mouth daily.     . hyoscyamine (LEVSIN) 0.125 MG tablet Take 1 tablet (0.125 mg total) by mouth at bedtime. 30 tablet 11  . Leuprolide Acetate (LUPRON DEPOT IM) Inject into the muscle as directed. Done at [place] office approx once every 3 months    . metoprolol tartrate (LOPRESSOR) 25 MG tablet Take 0.5 tablets (12.5 mg total) by mouth 2 (two) times daily. 90 tablet 3  . nitroGLYCERIN  (NITROSTAT) 0.4 MG SL tablet Place 0.4 mg under the tongue every 5 (five) minutes as needed for chest pain.    . pantoprazole (PROTONIX) 40 MG tablet TAKE 1 TABLET (40 MG TOTAL) BY MOUTH DAILY. 90 tablet 2  . triamcinolone cream (KENALOG) 0.1 % Apply 1 application topically daily.   3   No current facility-administered medications on file prior to visit.    Allergies  Allergen Reactions  . Isosorbide Other (See Comments)    "severe Headache" per patient  . Lisinopril Cough  . Ticlopidine Hcl Rash    Family History  Problem Relation Age of Onset  . Breast cancer Sister     2 sisters  . Pemphigus vulgaris Mother     died at 1  . Heart attack Father   . Hypertension Mother   . Hypertension Father   . Hypertension Sister   . Hypertension Brother   . Stroke Father     BP 130/62 mmHg  Pulse 71  Temp(Src) 97.5 F (36.4 C) (Oral)  Ht 5' 10.5" (1.791 m)  Wt 166 lb (75.297 kg)  BMI 23.47 kg/m2  SpO2 97%   Review of Systems Denies fever    Objective:   Physical Exam Vital signs: see vs page Gen: elderly, frail, no distress Right middle finger: now only minimal paronychial erythema.        Assessment & Plan:  Paronychial infection, improved.  We'll follow Hyperthyroidism: we'll skip recheck today, as pt says was checked 2 mos ago in [place]. Urinary sxs: well-controlled: Please continue the same medication  Patient is advised the following: Patient Instructions  Please sign a release of information for your 2016 labs in [place].  Please continue the same methimazole.  if ever you have fever while taking methimazole, stop it and call us, even if the reason is obvious, because of the risk of a rare side-effect. Please come back for a follow-up appointment in 3 months.

## 2015-09-01 NOTE — Telephone Encounter (Signed)
Please advise if ok to refill. Medication is listed under a historical provider.  Thanks!  

## 2015-09-01 NOTE — Patient Instructions (Addendum)
Please sign a release of information for your 2016 labs in [place].  Please continue the same methimazole.  if ever you have fever while taking methimazole, stop it and call us, even if the reason is obvious, because of the risk of a rare side-effect. Please come back for a follow-up appointment in 3 months.

## 2015-09-02 DIAGNOSIS — R3915 Urgency of urination: Secondary | ICD-10-CM | POA: Insufficient documentation

## 2015-09-05 ENCOUNTER — Encounter (HOSPITAL_COMMUNITY): Payer: Self-pay | Admitting: *Deleted

## 2015-09-05 ENCOUNTER — Emergency Department (HOSPITAL_COMMUNITY): Payer: Medicare Other

## 2015-09-05 ENCOUNTER — Inpatient Hospital Stay (HOSPITAL_COMMUNITY)
Admission: EM | Admit: 2015-09-05 | Discharge: 2015-09-12 | DRG: 391 | Disposition: A | Discharge status: Home Health | Payer: Medicare Other | Attending: Internal Medicine | Admitting: Internal Medicine

## 2015-09-05 ENCOUNTER — Telehealth: Payer: Self-pay | Admitting: Endocrinology

## 2015-09-05 DIAGNOSIS — R911 Solitary pulmonary nodule: Secondary | ICD-10-CM | POA: Diagnosis present

## 2015-09-05 DIAGNOSIS — D696 Thrombocytopenia, unspecified: Secondary | ICD-10-CM | POA: Diagnosis not present

## 2015-09-05 DIAGNOSIS — B349 Viral infection, unspecified: Secondary | ICD-10-CM

## 2015-09-05 DIAGNOSIS — R740 Nonspecific elevation of levels of transaminase and lactic acid dehydrogenase [LDH]: Secondary | ICD-10-CM | POA: Diagnosis present

## 2015-09-05 DIAGNOSIS — D649 Anemia, unspecified: Secondary | ICD-10-CM | POA: Diagnosis present

## 2015-09-05 DIAGNOSIS — I340 Nonrheumatic mitral (valve) insufficiency: Secondary | ICD-10-CM | POA: Diagnosis present

## 2015-09-05 DIAGNOSIS — I739 Peripheral vascular disease, unspecified: Secondary | ICD-10-CM

## 2015-09-05 DIAGNOSIS — R509 Fever, unspecified: Secondary | ICD-10-CM

## 2015-09-05 DIAGNOSIS — I350 Nonrheumatic aortic (valve) stenosis: Secondary | ICD-10-CM

## 2015-09-05 DIAGNOSIS — K219 Gastro-esophageal reflux disease without esophagitis: Secondary | ICD-10-CM | POA: Diagnosis present

## 2015-09-05 DIAGNOSIS — A084 Viral intestinal infection, unspecified: Secondary | ICD-10-CM | POA: Diagnosis not present

## 2015-09-05 DIAGNOSIS — K769 Liver disease, unspecified: Secondary | ICD-10-CM | POA: Diagnosis present

## 2015-09-05 DIAGNOSIS — R112 Nausea with vomiting, unspecified: Secondary | ICD-10-CM | POA: Diagnosis not present

## 2015-09-05 DIAGNOSIS — Z7901 Long term (current) use of anticoagulants: Secondary | ICD-10-CM

## 2015-09-05 DIAGNOSIS — R059 Cough, unspecified: Secondary | ICD-10-CM

## 2015-09-05 DIAGNOSIS — Z803 Family history of malignant neoplasm of breast: Secondary | ICD-10-CM

## 2015-09-05 DIAGNOSIS — R05 Cough: Secondary | ICD-10-CM | POA: Diagnosis not present

## 2015-09-05 DIAGNOSIS — Z79899 Other long term (current) drug therapy: Secondary | ICD-10-CM

## 2015-09-05 DIAGNOSIS — Z7902 Long term (current) use of antithrombotics/antiplatelets: Secondary | ICD-10-CM

## 2015-09-05 DIAGNOSIS — Z955 Presence of coronary angioplasty implant and graft: Secondary | ICD-10-CM

## 2015-09-05 DIAGNOSIS — J181 Lobar pneumonia, unspecified organism: Secondary | ICD-10-CM

## 2015-09-05 DIAGNOSIS — I701 Atherosclerosis of renal artery: Secondary | ICD-10-CM | POA: Diagnosis present

## 2015-09-05 DIAGNOSIS — R531 Weakness: Secondary | ICD-10-CM | POA: Diagnosis not present

## 2015-09-05 DIAGNOSIS — N183 Chronic kidney disease, stage 3 unspecified: Secondary | ICD-10-CM | POA: Diagnosis present

## 2015-09-05 DIAGNOSIS — E872 Acidosis: Secondary | ICD-10-CM | POA: Diagnosis present

## 2015-09-05 DIAGNOSIS — Z823 Family history of stroke: Secondary | ICD-10-CM

## 2015-09-05 DIAGNOSIS — I2510 Atherosclerotic heart disease of native coronary artery without angina pectoris: Secondary | ICD-10-CM | POA: Diagnosis present

## 2015-09-05 DIAGNOSIS — I10 Essential (primary) hypertension: Secondary | ICD-10-CM | POA: Diagnosis present

## 2015-09-05 DIAGNOSIS — E785 Hyperlipidemia, unspecified: Secondary | ICD-10-CM | POA: Diagnosis present

## 2015-09-05 DIAGNOSIS — R000 Tachycardia, unspecified: Secondary | ICD-10-CM

## 2015-09-05 DIAGNOSIS — R7989 Other specified abnormal findings of blood chemistry: Secondary | ICD-10-CM

## 2015-09-05 DIAGNOSIS — R778 Other specified abnormalities of plasma proteins: Secondary | ICD-10-CM | POA: Diagnosis present

## 2015-09-05 DIAGNOSIS — I129 Hypertensive chronic kidney disease with stage 1 through stage 4 chronic kidney disease, or unspecified chronic kidney disease: Secondary | ICD-10-CM | POA: Diagnosis present

## 2015-09-05 DIAGNOSIS — I248 Other forms of acute ischemic heart disease: Secondary | ICD-10-CM | POA: Diagnosis present

## 2015-09-05 DIAGNOSIS — Z8249 Family history of ischemic heart disease and other diseases of the circulatory system: Secondary | ICD-10-CM

## 2015-09-05 DIAGNOSIS — R404 Transient alteration of awareness: Secondary | ICD-10-CM | POA: Diagnosis not present

## 2015-09-05 DIAGNOSIS — Z86010 Personal history of colonic polyps: Secondary | ICD-10-CM

## 2015-09-05 DIAGNOSIS — Z888 Allergy status to other drugs, medicaments and biological substances status: Secondary | ICD-10-CM

## 2015-09-05 DIAGNOSIS — Z8546 Personal history of malignant neoplasm of prostate: Secondary | ICD-10-CM

## 2015-09-05 DIAGNOSIS — J189 Pneumonia, unspecified organism: Secondary | ICD-10-CM | POA: Diagnosis not present

## 2015-09-05 DIAGNOSIS — M4800 Spinal stenosis, site unspecified: Secondary | ICD-10-CM | POA: Diagnosis present

## 2015-09-05 DIAGNOSIS — E0590 Thyrotoxicosis, unspecified without thyrotoxic crisis or storm: Secondary | ICD-10-CM | POA: Diagnosis present

## 2015-09-05 DIAGNOSIS — R6510 Systemic inflammatory response syndrome (SIRS) of non-infectious origin without acute organ dysfunction: Secondary | ICD-10-CM | POA: Diagnosis present

## 2015-09-05 DIAGNOSIS — E860 Dehydration: Secondary | ICD-10-CM | POA: Diagnosis present

## 2015-09-05 DIAGNOSIS — Z7982 Long term (current) use of aspirin: Secondary | ICD-10-CM

## 2015-09-05 DIAGNOSIS — I779 Disorder of arteries and arterioles, unspecified: Secondary | ICD-10-CM | POA: Diagnosis present

## 2015-09-05 LAB — CBC WITH DIFFERENTIAL/PLATELET
BASOS ABS: 0 10*3/uL (ref 0.0–0.1)
BASOS PCT: 0 %
Eosinophils Absolute: 0 10*3/uL (ref 0.0–0.7)
Eosinophils Relative: 0 %
HEMATOCRIT: 34.6 % — AB (ref 39.0–52.0)
HEMOGLOBIN: 12.5 g/dL — AB (ref 13.0–17.0)
LYMPHS PCT: 3 %
Lymphs Abs: 0.3 10*3/uL — ABNORMAL LOW (ref 0.7–4.0)
MCH: 33.7 pg (ref 26.0–34.0)
MCHC: 36.1 g/dL — AB (ref 30.0–36.0)
MCV: 93.3 fL (ref 78.0–100.0)
MONOS PCT: 4 %
Monocytes Absolute: 0.3 10*3/uL (ref 0.1–1.0)
NEUTROS ABS: 8.2 10*3/uL — AB (ref 1.7–7.7)
NEUTROS PCT: 93 %
Platelets: 161 10*3/uL (ref 150–400)
RBC: 3.71 MIL/uL — ABNORMAL LOW (ref 4.22–5.81)
RDW: 12.9 % (ref 11.5–15.5)
WBC: 8.9 10*3/uL (ref 4.0–10.5)

## 2015-09-05 LAB — COMPREHENSIVE METABOLIC PANEL
ALBUMIN: 4.2 g/dL (ref 3.5–5.0)
ALK PHOS: 91 U/L (ref 38–126)
ALT: 24 U/L (ref 17–63)
AST: 47 U/L — AB (ref 15–41)
Anion gap: 13 (ref 5–15)
BILIRUBIN TOTAL: 1.9 mg/dL — AB (ref 0.3–1.2)
BUN: 28 mg/dL — AB (ref 6–20)
CALCIUM: 9.2 mg/dL (ref 8.9–10.3)
CO2: 20 mmol/L — ABNORMAL LOW (ref 22–32)
Chloride: 105 mmol/L (ref 101–111)
Creatinine, Ser: 1.03 mg/dL (ref 0.61–1.24)
GFR calc Af Amer: >60 mL/min (ref >60)
GFR calc non Af Amer: >60 mL/min (ref >60)
GLUCOSE: 148 mg/dL — AB (ref 65–99)
Potassium: 3.8 mmol/L (ref 3.5–5.1)
Sodium: 138 mmol/L (ref 135–145)
TOTAL PROTEIN: 7.6 g/dL (ref 6.5–8.1)

## 2015-09-05 LAB — URINALYSIS, ROUTINE W REFLEX MICROSCOPIC
BILIRUBIN URINE: NEGATIVE
GLUCOSE, UA: NEGATIVE mg/dL
Hgb urine dipstick: NEGATIVE
KETONES UR: NEGATIVE mg/dL
Leukocytes, UA: NEGATIVE
Nitrite: NEGATIVE
PH: 5.5 (ref 5.0–8.0)
Protein, ur: 30 mg/dL — AB
Specific Gravity, Urine: 1.029 (ref 1.005–1.030)

## 2015-09-05 LAB — URINE MICROSCOPIC-ADD ON: BACTERIA UA: NONE SEEN

## 2015-09-05 LAB — I-STAT CG4 LACTIC ACID, ED: Lactic Acid, Venous: 3.31 mmol/L (ref 0.5–2.0)

## 2015-09-05 LAB — LIPASE, BLOOD: Lipase: 25 U/L (ref 11–51)

## 2015-09-05 LAB — LACTIC ACID, PLASMA: Lactic Acid, Venous: 1.7 mmol/L (ref 0.5–2.0)

## 2015-09-05 MED ORDER — SODIUM CHLORIDE 0.9 % IV BOLUS (SEPSIS)
1000.0000 mL | INTRAVENOUS | Status: AC
  Administered 2015-09-05 (×2): 1000 mL via INTRAVENOUS

## 2015-09-05 MED ORDER — SODIUM CHLORIDE 0.9 % IV BOLUS (SEPSIS)
1000.0000 mL | Freq: Once | INTRAVENOUS | Status: AC
  Administered 2015-09-05: 1000 mL via INTRAVENOUS

## 2015-09-05 MED ORDER — VANCOMYCIN HCL IN DEXTROSE 1-5 GM/200ML-% IV SOLN
1000.0000 mg | Freq: Once | INTRAVENOUS | Status: AC
  Administered 2015-09-05: 1000 mg via INTRAVENOUS
  Filled 2015-09-05: qty 200

## 2015-09-05 MED ORDER — SODIUM CHLORIDE 0.9 % IV SOLN
INTRAVENOUS | Status: DC
  Administered 2015-09-05 – 2015-09-09 (×7): via INTRAVENOUS

## 2015-09-05 MED ORDER — PIPERACILLIN-TAZOBACTAM 3.375 G IVPB 30 MIN
3.3750 g | Freq: Once | INTRAVENOUS | Status: AC
  Administered 2015-09-05: 3.375 g via INTRAVENOUS
  Filled 2015-09-05: qty 50

## 2015-09-05 MED ORDER — SODIUM CHLORIDE 0.9 % IV BOLUS (SEPSIS)
500.0000 mL | INTRAVENOUS | Status: AC
  Administered 2015-09-05: 500 mL via INTRAVENOUS

## 2015-09-05 NOTE — H&P (Addendum)
PCP: Renato Shin, MD  [facility]  Referring provider  Zenia Resides   Chief Complaint: Diarrhea  HPI: Kurt Elliott is a 82 y.o. male   has a past medical history of Hypertension; Hyperlipemia; Unspecified disorder of liver; Cellulitis, leg; Pulmonary nodule, right; Abdominal pain, left lower quadrant; Adenocarcinoma of prostate ([place]); Esophageal stricture; Spinal stenosis; Hyperglycemia; Hyperthyroidism; GERD (gastroesophageal reflux disease); colonic polyps; Renal artery stenosis ([place]); Atheroma; Inferior mesenteric artery injury; Vertigo; Diverticulum; Incisional hernia; Rash; Cough; Carotid artery disease ([place]); Renal cyst; Dizziness; Unsteady gait; Sinus bradycardia; CAD (coronary artery disease), native coronary artery; Mild aortic stenosis; and Shortness of breath dyspnea.   Presented with nausea vomiting but no diarrhea for the past 24 hours patient got prescription of Zofran at home but did not have much improvement and presented to emergency department. He haven't had any fevers noted cough no muscle aches no sore throat his daughter has been sick recently.   IN ER: Rapid influenza PCR was ordered he was found to have elevated BUN up to 28, white blood cell count of 8.9 Lactic acid elevated at 3.3 just excess showing atelectasis pneumonia being less likely. UA showing no evidence of UTI  Regarding pertinent past history: Has history of hyperthyroidism for a pH he is currently receiving methimazole last time his blood work was checked in 2016 in [place], his thyroid has been ablated. He has history of stage IV prostate cancer for which he is on Lupron intermittently. He has known history of coronary disease status post last cardiac catheterization in July 2016 he's currently on Plavix  Hospitalist was called for admission for dehydration secondary to gastroenteritis  Review of Systems:    Pertinent positives include: abdominal pain, nausea, vomiting,     Constitutional:  No weight loss, night sweats, Fevers, chills, fatigue, weight loss  HEENT:  No headaches, Difficulty swallowing,Tooth/dental problems,Sore throat,  No sneezing, itching, ear ache, nasal congestion, post nasal drip,  Cardio-vascular:  No chest pain, Orthopnea, PND, anasarca, dizziness, palpitations.no Bilateral lower extremity swelling  GI:  No heartburn, indigestion, change in bowel habits, loss of appetite, melena, blood in stool, hematemesis Resp:  no shortness of breath at rest. No dyspnea on exertion, No excess mucus, no productive cough, No non-productive cough, No coughing up of blood.No change in color of mucus.No wheezing. Skin:  no rash or lesions. No jaundice GU:  no dysuria, change in color of urine, no urgency or frequency. No straining to urinate.  No flank pain.  Musculoskeletal:  No joint pain or no joint swelling. No decreased range of motion. No back pain.  Psych:  No change in mood or affect. No depression or anxiety. No memory loss.  Neuro: no localizing neurological complaints, no tingling, no weakness, no double vision, no gait abnormality, no slurred speech, no confusion  Otherwise ROS are negative except for above, 10 systems were reviewed  Past Medical History: Past Medical History  Diagnosis Date  . Hypertension   . Hyperlipemia   . Unspecified disorder of liver   . Cellulitis, leg     left  . Pulmonary nodule, right     2008 / unchanged CT scan, July, 2009  . Abdominal pain, left lower quadrant   . Adenocarcinoma of prostate ([place])   . Esophageal stricture   . Spinal stenosis   . Hyperglycemia   . Hyperthyroidism   . GERD (gastroesophageal reflux disease)   . Hx of colonic polyps   . Renal artery stenosis ([place])  Mild  . Atheroma     no penetrating ulcer, Abdominal aorta  . Inferior mesenteric artery injury     High-grade stenosis, CT scan, 2006  . Vertigo   . Diverticulum   . Incisional hernia   . Rash     Ticlid  .  Cough     ACE Inhibitor, tolerates ARB  . Carotid artery disease ([place])     Doppler, 2006, minimal disease  //  Doppler, May, 2012, mild increase velocities, 40-59% bilateral stenoses  . Renal cyst     Hyperdense cyst lower pole left kidney  . Dizziness   . Unsteady gait   . Sinus bradycardia   . CAD (coronary artery disease), native coronary artery     a. cath 2008  b. cath 10/21/2014 3v dzs w/ patent RCA stent, severe stenosis of mid LCx/OM1 s/p DES, moderately severe diffuse mid LAD treated with DES, patent prox LAD stent;  c. 01/2015 Cath: patent LAD, LCX, OM, and RCA stents->Med Rx.  . Mild aortic stenosis     a. 09/2014 Echo: EF 65-70%, Gr 1 DD, no rwma, mild AS;  b. 01/2015 Echo: EF 65-70%, mod LVH, no rwma, 3mmHg intracavitary gradient, mildly dil LA.  Marland Kitchen Shortness of breath dyspnea    Past Surgical History  Procedure Laterality Date  . Esophagogastroduodenoscopy    . Cardiac catheterization    . Incisional hernia repair    . Left heart catheterization with coronary angiogram N/A 10/21/2014    Procedure: LEFT HEART CATHETERIZATION WITH CORONARY ANGIOGRAM;  Surgeon: Sherren Mocha, MD;  Location: [facility] CATH LAB;  Service: Cardiovascular;  Laterality: N/A;  . Percutaneous coronary stent intervention (pci-s)  10/21/2014    Procedure: PERCUTANEOUS CORONARY STENT INTERVENTION (PCI-S);  Surgeon: Sherren Mocha, MD;  Location: [facility] CATH LAB;  Service: Cardiovascular;;  Mid LAD and OM1  . Sigmoid resection / rectopexy    . Cardiac catheterization N/A 02/18/2015    Procedure: Right/Left Heart Cath and Coronary Angiography;  Surgeon: Leonie Man, MD;  Location: [facility] CV LAB;  Service: Cardiovascular;  Laterality: N/A;     Medications: Prior to Admission medications   Medication Sig Start Date End Date Taking? Authorizing Provider  aspirin EC 81 MG tablet Take 81 mg by mouth daily.   Yes Historical Provider, MD  clopidogrel (PLAVIX) 75 MG tablet Take 1 tablet (75 mg total) by mouth daily  with breakfast. 10/22/14  Yes Almyra Deforest, PA  colestipol (COLESTID) 1 G tablet Take 2 g by mouth daily.    Yes Historical Provider, MD  hyoscyamine (LEVSIN) 0.125 MG tablet Take 1 tablet (0.125 mg total) by mouth at bedtime. 07/01/15  Yes Renato Shin, MD  metoprolol tartrate (LOPRESSOR) 25 MG tablet Take 0.5 tablets (12.5 mg total) by mouth 2 (two) times daily. 08/18/15  Yes Carlena Bjornstad, MD  pantoprazole (PROTONIX) 40 MG tablet TAKE 1 TABLET (40 MG TOTAL) BY MOUTH DAILY. 07/31/15  Yes Renato Shin, MD  Leuprolide Acetate (LUPRON DEPOT IM) Inject into the muscle as directed. Done at [place] office approx once every 3 months    Historical Provider, MD  methimazole (TAPAZOLE) 5 MG tablet TAKE 1 TABLET BY MOUTH 3 TIMES A WEEK (MON, WED, AND FRI) 09/01/15   Renato Shin, MD  nitroGLYCERIN (NITROSTAT) 0.4 MG SL tablet Place 0.4 mg under the tongue every 5 (five) minutes as needed for chest pain.    Historical Provider, MD    Allergies:   Allergies  Allergen Reactions  . Isosorbide  Other (See Comments)    "severe Headache" per patient  . Lisinopril Cough  . Ticlopidine Hcl Rash    Social History:  Ambulatory independently   Lives at home  With family     reports that he has never smoked. He has never used smokeless tobacco. He reports that he does not drink alcohol or use illicit drugs.     Family History: family history includes Breast cancer in his sister; Heart attack in his father; Hypertension in his brother, father, mother, and sister; Pemphigus vulgaris in his mother; Stroke in his father.    Physical Exam: Patient Vitals for the past 24 hrs:  BP Temp Temp src Pulse Resp SpO2 Height Weight  09/05/15 2153 184/70 mmHg - - 113 18 96 % - -  09/05/15 2130 183/69 mmHg - - 113 20 96 % - -  09/05/15 2100 (!) 176/53 mmHg - - 110 19 95 % - -  09/05/15 2049 173/62 mmHg - - 112 20 97 % - -  09/05/15 2045 - - - 110 23 97 % - -  09/05/15 2032 - - - - - - 5\' 10"  (1.778 m) 70.308 kg (155 lb)    09/05/15 1912 169/73 mmHg 98.3 F (36.8 C) Oral (!) 125 18 95 % - -    1. General:  in No Acute distress 2. Psychological: Alert and  Oriented 3. Head/ENT:     Dry Mucous Membranes                          Head Non traumatic, neck supple                          Normal  Dentition 4. SKIN:   decreased Skin turgor,  Skin clean Dry and intact no rash 5. Heart: Regular rate and rhythm no Murmur, Rub or gallop 6. Lungs: Somewhat distant but no wheezes or crackles   7. Abdomen: Soft, non-tender, Non distended 8. Lower extremities: no clubbing, cyanosis, or edema 9. Neurologically Grossly intact, moving all 4 extremities equally 10. MSK: Normal range of motion  body mass index is 22.24 kg/(m^2).   Labs on Admission:   Results for orders placed or performed during the hospital encounter of 09/05/15 (from the past 24 hour(s))  CBC with Differential/Platelet     Status: Abnormal   Collection Time: 09/05/15  7:35 PM  Result Value Ref Range   WBC 8.9 4.0 - 10.5 K/uL   RBC 3.71 (L) 4.22 - 5.81 MIL/uL   Hemoglobin 12.5 (L) 13.0 - 17.0 g/dL   HCT 34.6 (L) 39.0 - 52.0 %   MCV 93.3 78.0 - 100.0 fL   MCH 33.7 26.0 - 34.0 pg   MCHC 36.1 (H) 30.0 - 36.0 g/dL   RDW 12.9 11.5 - 15.5 %   Platelets 161 150 - 400 K/uL   Neutrophils Relative % 93 %   Neutro Abs 8.2 (H) 1.7 - 7.7 K/uL   Lymphocytes Relative 3 %   Lymphs Abs 0.3 (L) 0.7 - 4.0 K/uL   Monocytes Relative 4 %   Monocytes Absolute 0.3 0.1 - 1.0 K/uL   Eosinophils Relative 0 %   Eosinophils Absolute 0.0 0.0 - 0.7 K/uL   Basophils Relative 0 %   Basophils Absolute 0.0 0.0 - 0.1 K/uL  Comprehensive metabolic panel     Status: Abnormal   Collection Time: 09/05/15  7:35 PM  Result Value  Ref Range   Sodium 138 135 - 145 mmol/L   Potassium 3.8 3.5 - 5.1 mmol/L   Chloride 105 101 - 111 mmol/L   CO2 20 (L) 22 - 32 mmol/L   Glucose, Bld 148 (H) 65 - 99 mg/dL   BUN 28 (H) 6 - 20 mg/dL   Creatinine, Ser 1.03 0.61 - 1.24 mg/dL   Calcium  9.2 8.9 - 10.3 mg/dL   Total Protein 7.6 6.5 - 8.1 g/dL   Albumin 4.2 3.5 - 5.0 g/dL   AST 47 (H) 15 - 41 U/L   ALT 24 17 - 63 U/L   Alkaline Phosphatase 91 38 - 126 U/L   Total Bilirubin 1.9 (H) 0.3 - 1.2 mg/dL   GFR calc non Af Amer >60 >60 mL/min   GFR calc Af Amer >60 >60 mL/min   Anion gap 13 5 - 15  Lipase, blood     Status: None   Collection Time: 09/05/15  7:35 PM  Result Value Ref Range   Lipase 25 11 - 51 U/L  I-Stat CG4 Lactic Acid, ED     Status: Abnormal   Collection Time: 09/05/15  7:56 PM  Result Value Ref Range   Lactic Acid, Venous 3.31 (HH) 0.5 - 2.0 mmol/L   Comment NOTIFIED PHYSICIAN   Urinalysis, Routine w reflex microscopic (not at [facility])     Status: Abnormal   Collection Time: 09/05/15  8:20 PM  Result Value Ref Range   Color, Urine AMBER (A) YELLOW   APPearance CLEAR CLEAR   Specific Gravity, Urine 1.029 1.005 - 1.030   pH 5.5 5.0 - 8.0   Glucose, UA NEGATIVE NEGATIVE mg/dL   Hgb urine dipstick NEGATIVE NEGATIVE   Bilirubin Urine NEGATIVE NEGATIVE   Ketones, ur NEGATIVE NEGATIVE mg/dL   Protein, ur 30 (A) NEGATIVE mg/dL   Nitrite NEGATIVE NEGATIVE   Leukocytes, UA NEGATIVE NEGATIVE  Urine microscopic-add on     Status: Abnormal   Collection Time: 09/05/15  8:20 PM  Result Value Ref Range   Squamous Epithelial / LPF 0-5 (A) NONE SEEN   WBC, UA 0-5 0 - 5 WBC/hpf   RBC / HPF 0-5 0 - 5 RBC/hpf   Bacteria, UA NONE SEEN NONE SEEN    UA   no evidence of UTI  Lab Results  Component Value Date   HGBA1C 5.4 06/01/2011    Estimated Creatinine Clearance: 45.5 mL/min (by C-G formula based on Cr of 1.03).  BNP (last 3 results) No results for input(s): PROBNP in the last 8760 hours.  Other results:  I have pearsonaly reviewed this: ECG REPORT  Rate: 112  Rhythm: Sinus tachycardia ST&T Change: Nonspecific ST segment changes in inferior leads similar to prior in July 2016 QTC 459  Filed Weights   09/05/15 2032  Weight: 70.308 kg (155 lb)      Cultures: No results found for: SDES, SPECREQUEST, CULT, REPTSTATUS   Radiological Exams on Admission: Dg Chest 2 View  09/05/2015  CLINICAL DATA:  82 year old male with cough and nausea EXAM: CHEST  2 VIEW COMPARISON:  Radiograph dated 03/28/2015 FINDINGS: Two views of the chest demonstrate minimal atelectatic changes of the left lung base. No focal consolidation, pleural effusion, or pneumothorax. Stable cardiac silhouette. No acute osseous pathology. IMPRESSION: Left lung base minimal atelectatic changes. Pneumonia is less likely. Electronically Signed   By: Anner Crete M.D.   On: 09/05/2015 21:29    Chart has been reviewed  Family   at  Bedside  plan of care was discussed with  Daughter and Son in [place] o the phone Dr/ Rob caplan, Wife, Husband, Sister, Brother   Assessment/Plan  18 year old gentleman with history of high blood pressure, chronic kidney disease, coronary artery disease presents with nausea vomiting and fever with evidence of dehydration and  SIRS  Present on Admission:  . SIRS (systemic inflammatory response syndrome) (HCC) suspect viral source but for now we'll treat with broad-spectrum antibiotics until blood cultures have been cleared. Administer for aggressive fluid resuscitation to obtain serial lactic acid, PCR for influenza pending  . Essential hypertension stable no evidence of hypotension we'll continue home medications  . CKD (chronic kidney disease) stage 3, GFR 30-59 ml/min stable continue to monitor  . Carotid artery disease (HCC) stable chronic  . Dehydration administer IV fluids and check orthostatics in the morning   coronary artery disease given nausea and vomiting in this elderly male will cycle cardiac enzymes he is currently chest pain free continue Plavix and aspirin-  . Sinus tachycardia ([place]) - most likely secondary to dehydration. We'll check TSH will admit for rehydration monitor on telemetry   Prophylaxis:   Lovenox   CODE STATUS:   FULL CODE as per family  Disposition:  To home once workup is complete and patient is stable  Other plan as per orders.  I have spent a total of 58 min on this admission    Ellena Kamen 09/05/2015, 12:00 AM   [facility]  Pager [phone]   after 2 AM please page floor coverage PA If 7AM-7PM, please contact the day team taking care of the patient  [email or web address]  Password [email or web address]

## 2015-09-05 NOTE — Telephone Encounter (Signed)
See note below and please advise, Thanks! 

## 2015-09-05 NOTE — ED Notes (Addendum)
Below orders not completed by EW. 

## 2015-09-05 NOTE — ED Provider Notes (Addendum)
CSN: [id number]     Arrival date & time 09/05/15  1844 History   First MD Initiated Contact with Patient 09/05/15 1853     Chief Complaint  Patient presents with  . Nausea  . Emesis     (Consider location/radiation/quality/duration/timing/severity/associated sxs/prior Treatment) HPI Comments: Patient here with one-day history of nausea and vomiting with low-grade temperature. Denies any abdominal pain and took Zofran and symptoms improved initially but then came back. His emesis is been nonbilious. States that he probably vomited a times a day. History of same and is prone to being dehydrated. Patient denies myalgias, cough, congestion. No sore throat. No diarrhea. EMS was called and patient transported here.  Patient is a 82 y.o. male presenting with vomiting. The history is provided by the patient.  Emesis   Past Medical History  Diagnosis Date  . Hypertension   . Hyperlipemia   . Unspecified disorder of liver   . Cellulitis, leg     left  . Pulmonary nodule, right     2008 / unchanged CT scan, July, 2009  . Abdominal pain, left lower quadrant   . Adenocarcinoma of prostate ([place])   . Esophageal stricture   . Spinal stenosis   . Hyperglycemia   . Hyperthyroidism   . GERD (gastroesophageal reflux disease)   . Hx of colonic polyps   . Renal artery stenosis (HCC)     Mild  . Atheroma     no penetrating ulcer, Abdominal aorta  . Inferior mesenteric artery injury     High-grade stenosis, CT scan, 2006  . Vertigo   . Diverticulum   . Incisional hernia   . Rash     Ticlid  . Cough     ACE Inhibitor, tolerates ARB  . Carotid artery disease ([place])     Doppler, 2006, minimal disease  //  Doppler, May, 2012, mild increase velocities, 40-59% bilateral stenoses  . Renal cyst     Hyperdense cyst lower pole left kidney  . Dizziness   . Unsteady gait   . Sinus bradycardia   . CAD (coronary artery disease), native coronary artery     a. cath 2008  b. cath 10/21/2014 3v dzs w/  patent RCA stent, severe stenosis of mid LCx/OM1 s/p DES, moderately severe diffuse mid LAD treated with DES, patent prox LAD stent;  c. 01/2015 Cath: patent LAD, LCX, OM, and RCA stents->Med Rx.  . Mild aortic stenosis     a. 09/2014 Echo: EF 65-70%, Gr 1 DD, no rwma, mild AS;  b. 01/2015 Echo: EF 65-70%, mod LVH, no rwma, 34mmHg intracavitary gradient, mildly dil LA.  Marland Kitchen Shortness of breath dyspnea    Past Surgical History  Procedure Laterality Date  . Esophagogastroduodenoscopy    . Cardiac catheterization    . Incisional hernia repair    . Left heart catheterization with coronary angiogram N/A 10/21/2014    Procedure: LEFT HEART CATHETERIZATION WITH CORONARY ANGIOGRAM;  Surgeon: Sherren Mocha, MD;  Location: [facility] CATH LAB;  Service: Cardiovascular;  Laterality: N/A;  . Percutaneous coronary stent intervention (pci-s)  10/21/2014    Procedure: PERCUTANEOUS CORONARY STENT INTERVENTION (PCI-S);  Surgeon: Sherren Mocha, MD;  Location: [facility] CATH LAB;  Service: Cardiovascular;;  Mid LAD and OM1  . Sigmoid resection / rectopexy    . Cardiac catheterization N/A 02/18/2015    Procedure: Right/Left Heart Cath and Coronary Angiography;  Surgeon: Leonie Man, MD;  Location: [facility] CV LAB;  Service: Cardiovascular;  Laterality: N/A;  Family History  Problem Relation Age of Onset  . Breast cancer Sister     2 sisters  . Pemphigus vulgaris Mother     died at 50  . Heart attack Father   . Hypertension Mother   . Hypertension Father   . Hypertension Sister   . Hypertension Brother   . Stroke Father    Social History  Substance Use Topics  . Smoking status: Never Smoker   . Smokeless tobacco: Never Used  . Alcohol Use: No    Review of Systems  Gastrointestinal: Positive for vomiting.  All other systems reviewed and are negative.     Allergies  Isosorbide; Lisinopril; and Ticlopidine hcl  Home Medications   Prior to Admission medications   Medication Sig Start Date End Date  Taking? Authorizing Provider  aspirin EC 81 MG tablet Take 81 mg by mouth daily.    Historical Provider, MD  clopidogrel (PLAVIX) 75 MG tablet Take 1 tablet (75 mg total) by mouth daily with breakfast. 10/22/14   Almyra Deforest, PA  colestipol (COLESTID) 1 G tablet Take 2 g by mouth daily.     Historical Provider, MD  hyoscyamine (LEVSIN) 0.125 MG tablet Take 1 tablet (0.125 mg total) by mouth at bedtime. 07/01/15   Renato Shin, MD  Leuprolide Acetate (LUPRON DEPOT IM) Inject into the muscle as directed. Done at [place] office approx once every 3 months    Historical Provider, MD  methimazole (TAPAZOLE) 5 MG tablet TAKE 1 TABLET BY MOUTH 3 TIMES A WEEK (MON, WED, AND FRI) 09/01/15   Renato Shin, MD  metoprolol tartrate (LOPRESSOR) 25 MG tablet Take 0.5 tablets (12.5 mg total) by mouth 2 (two) times daily. 08/18/15   Carlena Bjornstad, MD  nitroGLYCERIN (NITROSTAT) 0.4 MG SL tablet Place 0.4 mg under the tongue every 5 (five) minutes as needed for chest pain.    Historical Provider, MD  pantoprazole (PROTONIX) 40 MG tablet TAKE 1 TABLET (40 MG TOTAL) BY MOUTH DAILY. 07/31/15   Renato Shin, MD  triamcinolone cream (KENALOG) 0.1 % Apply 1 application topically daily.  04/10/14   Historical Provider, MD   There were no vitals taken for this visit. Physical Exam  Constitutional: He is oriented to person, place, and time. He appears well-developed and well-nourished.  Non-toxic appearance. No distress.  HENT:  Head: Normocephalic and atraumatic.  Eyes: Conjunctivae, EOM and lids are normal. Pupils are equal, round, and reactive to light.  Neck: Normal range of motion. Neck supple. No tracheal deviation present. No thyroid mass present.  Cardiovascular: Normal rate, regular rhythm and normal heart sounds.  Exam reveals no gallop.   No murmur heard. Pulmonary/Chest: Effort normal and breath sounds normal. No stridor. No respiratory distress. He has no decreased breath sounds. He has no wheezes. He has no rhonchi. He  has no rales.  Abdominal: Soft. Normal appearance and bowel sounds are normal. He exhibits no distension. There is no tenderness. There is no rigidity, no rebound, no guarding and no CVA tenderness.  Musculoskeletal: Normal range of motion. He exhibits no edema or tenderness.  Neurological: He is alert and oriented to person, place, and time. He has normal strength. No cranial nerve deficit or sensory deficit. GCS eye subscore is 4. GCS verbal subscore is 5. GCS motor subscore is 6.  Skin: Skin is warm and dry. No abrasion and no rash noted.  Psychiatric: He has a normal mood and affect. His speech is normal and behavior is normal.  Nursing  note and vitals reviewed.   ED Course  Procedures (including critical care time) Labs Review Labs Reviewed  URINE CULTURE  CULTURE, BLOOD (ROUTINE X 2)  CULTURE, BLOOD (ROUTINE X 2)  CBC WITH DIFFERENTIAL/PLATELET  COMPREHENSIVE METABOLIC PANEL  LIPASE, BLOOD  URINALYSIS, ROUTINE W REFLEX MICROSCOPIC (NOT AT [facility])  I-STAT CG4 LACTIC ACID, ED    Imaging Review No results found. I have personally reviewed and evaluated these images and lab results as part of my medical decision-making.   EKG Interpretation   Date/Time:  Friday September 05 2015 20:31:12 EST Ventricular Rate:  112 PR Interval:  135 QRS Duration: 90 QT Interval:  336 QTC Calculation: 459 R Axis:   69 Text Interpretation:  Sinus tachycardia Borderline repolarization  abnormality Confirmed by Raylie Maddison  MD, Adelai Achey ([id number]) on 09/05/2015 9:06:40  PM      MDM   Final diagnoses:  None   Patient given IV fluids here for dehydration and remains tachycardic. He is afebrile. He is mentating appropriately. Repeat lactate is been ordered. Urinalysis negative for infection. Chest x-ray without acute infiltrate. Flu test ordered. Suspect viral illness and will hold antibiotics at this time and admitted to the hospitalist service  10:37 PM Repeat temperature shows the patient to be now  febrile. Patient to be started on IV antibiotics.   Lacretia Leigh, MD 09/05/15 2204  Lacretia Leigh, MD 09/05/15 [phone]

## 2015-09-05 NOTE — Telephone Encounter (Signed)
Calling in rx is not safe at your age. Please see urgent care.

## 2015-09-05 NOTE — ED Notes (Signed)
Patient is alert and oriented x4.  He is complaining of nausea and vomiting that started this morning.   Patient was given zofran at home with little relief.

## 2015-09-05 NOTE — Telephone Encounter (Signed)
Pt's wife advised of note below. She voiced understanding.

## 2015-09-05 NOTE — Progress Notes (Signed)
Pharmacy Antibiotic Note  Kurt Elliott is a 82 y.o. male admitted on 09/05/2015 with Sepsis.  Pharmacy has been consulted for Zosyn/Vancomycin dosing.  Plan: Zosyn 3.375g IV q8h (4 hour infusion).  Vancomycin 1Gm x1 then 500 mg IV q12h  Height: 5\' 10"  (177.8 cm) Weight: 155 lb (70.308 kg) IBW/kg (Calculated) : 73  Temp (24hrs), Avg:99.9 F (37.7 C), Min:98.3 F (36.8 C), Max:101.4 F (38.6 C)   Recent Labs Lab 09/05/15 1935 09/05/15 1956  WBC 8.9  --   CREATININE 1.03  --   LATICACIDVEN  --  3.31*    Estimated Creatinine Clearance: 45.5 mL/min (by C-G formula based on Cr of 1.03).    Allergies  Allergen Reactions  . Isosorbide Other (See Comments)    "severe Headache" per patient  . Lisinopril Cough  . Ticlopidine Hcl Rash    Antimicrobials this admission: 2/10 zosyn>>  2/10 vancomycin >>   Dose adjustments this admission:   Microbiology results:  BCx:   UCx:    Sputum:    MRSA PCR:  Thank you for allowing pharmacy to be a part of this patient's care.  Dorrene German 09/05/2015 10:54 PM

## 2015-09-05 NOTE — Telephone Encounter (Signed)
Pt was sick with vomiting this AM pt wife is asking for a rx for zofran for him--he is still very weak but is no longer vomiting

## 2015-09-05 NOTE — ED Notes (Signed)
Kurt Elliott, EDP made aware of patient lactic result.

## 2015-09-06 ENCOUNTER — Encounter (HOSPITAL_COMMUNITY): Payer: Self-pay | Admitting: Internal Medicine

## 2015-09-06 ENCOUNTER — Other Ambulatory Visit (HOSPITAL_COMMUNITY): Payer: Medicare Other

## 2015-09-06 DIAGNOSIS — Z803 Family history of malignant neoplasm of breast: Secondary | ICD-10-CM | POA: Diagnosis not present

## 2015-09-06 DIAGNOSIS — I2510 Atherosclerotic heart disease of native coronary artery without angina pectoris: Secondary | ICD-10-CM | POA: Diagnosis not present

## 2015-09-06 DIAGNOSIS — Z7982 Long term (current) use of aspirin: Secondary | ICD-10-CM | POA: Diagnosis not present

## 2015-09-06 DIAGNOSIS — K219 Gastro-esophageal reflux disease without esophagitis: Secondary | ICD-10-CM | POA: Diagnosis present

## 2015-09-06 DIAGNOSIS — R7989 Other specified abnormal findings of blood chemistry: Secondary | ICD-10-CM | POA: Diagnosis not present

## 2015-09-06 DIAGNOSIS — K769 Liver disease, unspecified: Secondary | ICD-10-CM | POA: Diagnosis present

## 2015-09-06 DIAGNOSIS — D696 Thrombocytopenia, unspecified: Secondary | ICD-10-CM | POA: Diagnosis present

## 2015-09-06 DIAGNOSIS — R112 Nausea with vomiting, unspecified: Secondary | ICD-10-CM | POA: Diagnosis not present

## 2015-09-06 DIAGNOSIS — I248 Other forms of acute ischemic heart disease: Secondary | ICD-10-CM | POA: Diagnosis not present

## 2015-09-06 DIAGNOSIS — I350 Nonrheumatic aortic (valve) stenosis: Secondary | ICD-10-CM

## 2015-09-06 DIAGNOSIS — I340 Nonrheumatic mitral (valve) insufficiency: Secondary | ICD-10-CM | POA: Diagnosis present

## 2015-09-06 DIAGNOSIS — I10 Essential (primary) hypertension: Secondary | ICD-10-CM

## 2015-09-06 DIAGNOSIS — D649 Anemia, unspecified: Secondary | ICD-10-CM | POA: Diagnosis present

## 2015-09-06 DIAGNOSIS — Z823 Family history of stroke: Secondary | ICD-10-CM | POA: Diagnosis not present

## 2015-09-06 DIAGNOSIS — Z8546 Personal history of malignant neoplasm of prostate: Secondary | ICD-10-CM | POA: Diagnosis not present

## 2015-09-06 DIAGNOSIS — I701 Atherosclerosis of renal artery: Secondary | ICD-10-CM | POA: Diagnosis present

## 2015-09-06 DIAGNOSIS — Z955 Presence of coronary angioplasty implant and graft: Secondary | ICD-10-CM | POA: Diagnosis not present

## 2015-09-06 DIAGNOSIS — Z79899 Other long term (current) drug therapy: Secondary | ICD-10-CM | POA: Diagnosis not present

## 2015-09-06 DIAGNOSIS — R0602 Shortness of breath: Secondary | ICD-10-CM | POA: Diagnosis not present

## 2015-09-06 DIAGNOSIS — Z888 Allergy status to other drugs, medicaments and biological substances status: Secondary | ICD-10-CM | POA: Diagnosis not present

## 2015-09-06 DIAGNOSIS — R531 Weakness: Secondary | ICD-10-CM | POA: Diagnosis not present

## 2015-09-06 DIAGNOSIS — Z7901 Long term (current) use of anticoagulants: Secondary | ICD-10-CM | POA: Diagnosis not present

## 2015-09-06 DIAGNOSIS — R05 Cough: Secondary | ICD-10-CM | POA: Diagnosis not present

## 2015-09-06 DIAGNOSIS — E0590 Thyrotoxicosis, unspecified without thyrotoxic crisis or storm: Secondary | ICD-10-CM | POA: Diagnosis present

## 2015-09-06 DIAGNOSIS — N183 Chronic kidney disease, stage 3 (moderate): Secondary | ICD-10-CM | POA: Diagnosis not present

## 2015-09-06 DIAGNOSIS — R5383 Other fatigue: Secondary | ICD-10-CM | POA: Diagnosis not present

## 2015-09-06 DIAGNOSIS — E872 Acidosis: Secondary | ICD-10-CM | POA: Diagnosis not present

## 2015-09-06 DIAGNOSIS — E860 Dehydration: Secondary | ICD-10-CM

## 2015-09-06 DIAGNOSIS — R6510 Systemic inflammatory response syndrome (SIRS) of non-infectious origin without acute organ dysfunction: Secondary | ICD-10-CM | POA: Diagnosis not present

## 2015-09-06 DIAGNOSIS — E785 Hyperlipidemia, unspecified: Secondary | ICD-10-CM | POA: Diagnosis present

## 2015-09-06 DIAGNOSIS — J189 Pneumonia, unspecified organism: Secondary | ICD-10-CM | POA: Diagnosis not present

## 2015-09-06 DIAGNOSIS — M4800 Spinal stenosis, site unspecified: Secondary | ICD-10-CM | POA: Diagnosis present

## 2015-09-06 DIAGNOSIS — R740 Nonspecific elevation of levels of transaminase and lactic acid dehydrogenase [LDH]: Secondary | ICD-10-CM | POA: Diagnosis present

## 2015-09-06 DIAGNOSIS — A084 Viral intestinal infection, unspecified: Secondary | ICD-10-CM | POA: Diagnosis present

## 2015-09-06 DIAGNOSIS — Z86010 Personal history of colonic polyps: Secondary | ICD-10-CM | POA: Diagnosis not present

## 2015-09-06 DIAGNOSIS — Z8249 Family history of ischemic heart disease and other diseases of the circulatory system: Secondary | ICD-10-CM | POA: Diagnosis not present

## 2015-09-06 DIAGNOSIS — Z7902 Long term (current) use of antithrombotics/antiplatelets: Secondary | ICD-10-CM | POA: Diagnosis not present

## 2015-09-06 DIAGNOSIS — I129 Hypertensive chronic kidney disease with stage 1 through stage 4 chronic kidney disease, or unspecified chronic kidney disease: Secondary | ICD-10-CM | POA: Diagnosis present

## 2015-09-06 DIAGNOSIS — R509 Fever, unspecified: Secondary | ICD-10-CM | POA: Diagnosis not present

## 2015-09-06 DIAGNOSIS — R778 Other specified abnormalities of plasma proteins: Secondary | ICD-10-CM | POA: Diagnosis present

## 2015-09-06 DIAGNOSIS — R911 Solitary pulmonary nodule: Secondary | ICD-10-CM | POA: Diagnosis present

## 2015-09-06 LAB — CBC
HCT: 30 % — ABNORMAL LOW (ref 39.0–52.0)
HEMOGLOBIN: 10.7 g/dL — AB (ref 13.0–17.0)
MCH: 34.1 pg — AB (ref 26.0–34.0)
MCHC: 35.7 g/dL (ref 30.0–36.0)
MCV: 95.5 fL (ref 78.0–100.0)
PLATELETS: 127 10*3/uL — AB (ref 150–400)
RBC: 3.14 MIL/uL — ABNORMAL LOW (ref 4.22–5.81)
RDW: 13.2 % (ref 11.5–15.5)
WBC: 5.9 10*3/uL (ref 4.0–10.5)

## 2015-09-06 LAB — COMPREHENSIVE METABOLIC PANEL
ALBUMIN: 3.5 g/dL (ref 3.5–5.0)
ALK PHOS: 71 U/L (ref 38–126)
ALT: 19 U/L (ref 17–63)
AST: 39 U/L (ref 15–41)
Anion gap: 9 (ref 5–15)
BUN: 25 mg/dL — AB (ref 6–20)
CALCIUM: 8.1 mg/dL — AB (ref 8.9–10.3)
CO2: 20 mmol/L — AB (ref 22–32)
CREATININE: 0.98 mg/dL (ref 0.61–1.24)
Chloride: 110 mmol/L (ref 101–111)
GFR calc Af Amer: >60 mL/min (ref >60)
GFR calc non Af Amer: >60 mL/min (ref >60)
GLUCOSE: 122 mg/dL — AB (ref 65–99)
Potassium: 3.5 mmol/L (ref 3.5–5.1)
SODIUM: 139 mmol/L (ref 135–145)
Total Bilirubin: 1.7 mg/dL — ABNORMAL HIGH (ref 0.3–1.2)
Total Protein: 6.3 g/dL — ABNORMAL LOW (ref 6.5–8.1)

## 2015-09-06 LAB — PHOSPHORUS: Phosphorus: 2.1 mg/dL — ABNORMAL LOW (ref 2.5–4.6)

## 2015-09-06 LAB — INFLUENZA PANEL BY PCR (TYPE A & B)
H1N1 flu by pcr: NOT DETECTED
Influenza A By PCR: NEGATIVE
Influenza B By PCR: NEGATIVE

## 2015-09-06 LAB — EXPECTORATED SPUTUM ASSESSMENT W GRAM STAIN, RFLX TO RESP C

## 2015-09-06 LAB — T4, FREE: Free T4: 0.84 ng/dL (ref 0.61–1.12)

## 2015-09-06 LAB — TROPONIN I
TROPONIN I: 0.1 ng/mL — AB (ref <0.031)
Troponin I: 0.2 ng/mL — ABNORMAL HIGH (ref <0.031)
Troponin I: 0.24 ng/mL — ABNORMAL HIGH (ref <0.031)

## 2015-09-06 LAB — MAGNESIUM: Magnesium: 1.2 mg/dL — ABNORMAL LOW (ref 1.7–2.4)

## 2015-09-06 LAB — PROCALCITONIN: Procalcitonin: 0.13 ng/mL

## 2015-09-06 LAB — LACTIC ACID, PLASMA
Lactic Acid, Venous: 1.9 mmol/L (ref 0.5–2.0)
Lactic Acid, Venous: 1.3 mmol/L (ref 0.5–2.0)

## 2015-09-06 LAB — TSH: TSH: 1.459 u[IU]/mL (ref 0.350–4.500)

## 2015-09-06 LAB — EXPECTORATED SPUTUM ASSESSMENT W REFEX TO RESP CULTURE

## 2015-09-06 MED ORDER — ACETAMINOPHEN 325 MG PO TABS
650.0000 mg | ORAL_TABLET | Freq: Four times a day (QID) | ORAL | Status: DC | PRN
  Administered 2015-09-06 – 2015-09-09 (×2): 650 mg via ORAL
  Filled 2015-09-06 (×2): qty 2

## 2015-09-06 MED ORDER — ONDANSETRON HCL 4 MG/2ML IJ SOLN: 4.0000 mg | Freq: Four times a day (QID) | INTRAMUSCULAR | Status: DC | PRN

## 2015-09-06 MED ORDER — SODIUM CHLORIDE 0.9% FLUSH
3.0000 mL | Freq: Two times a day (BID) | INTRAVENOUS | Status: DC
  Administered 2015-09-06 – 2015-09-12 (×8): 3 mL via INTRAVENOUS

## 2015-09-06 MED ORDER — SODIUM CHLORIDE 0.9 % IV BOLUS (SEPSIS)
500.0000 mL | INTRAVENOUS | Status: AC
  Administered 2015-09-06: 500 mL via INTRAVENOUS

## 2015-09-06 MED ORDER — ENOXAPARIN SODIUM 40 MG/0.4ML ~~LOC~~ SOLN
40.0000 mg | SUBCUTANEOUS | Status: DC
  Administered 2015-09-06 – 2015-09-12 (×7): 40 mg via SUBCUTANEOUS
  Filled 2015-09-06 (×7): qty 0.4

## 2015-09-06 MED ORDER — HYDROCODONE-ACETAMINOPHEN 5-325 MG PO TABS: 1.0000 | ORAL_TABLET | ORAL | Status: DC | PRN

## 2015-09-06 MED ORDER — ONDANSETRON HCL 4 MG PO TABS: 4.0000 mg | ORAL_TABLET | Freq: Four times a day (QID) | ORAL | Status: DC | PRN

## 2015-09-06 MED ORDER — ACETAMINOPHEN 650 MG RE SUPP
650.0000 mg | Freq: Four times a day (QID) | RECTAL | Status: DC | PRN
  Filled 2015-09-06: qty 1

## 2015-09-06 MED ORDER — VANCOMYCIN HCL 500 MG IV SOLR
500.0000 mg | Freq: Two times a day (BID) | INTRAVENOUS | Status: DC
  Administered 2015-09-06 – 2015-09-07 (×4): 500 mg via INTRAVENOUS
  Filled 2015-09-06 (×5): qty 500

## 2015-09-06 MED ORDER — ASPIRIN EC 81 MG PO TBEC
81.0000 mg | DELAYED_RELEASE_TABLET | Freq: Every day | ORAL | Status: DC
  Administered 2015-09-06 – 2015-09-12 (×7): 81 mg via ORAL
  Filled 2015-09-06 (×7): qty 1

## 2015-09-06 MED ORDER — ZOLPIDEM TARTRATE 5 MG PO TABS
5.0000 mg | ORAL_TABLET | Freq: Once | ORAL | Status: AC
  Administered 2015-09-06: 5 mg via ORAL
  Filled 2015-09-06: qty 1

## 2015-09-06 MED ORDER — METOPROLOL TARTRATE 25 MG PO TABS
12.5000 mg | ORAL_TABLET | Freq: Two times a day (BID) | ORAL | Status: DC
  Administered 2015-09-06 – 2015-09-12 (×14): 12.5 mg via ORAL
  Filled 2015-09-06 (×14): qty 1

## 2015-09-06 MED ORDER — METHIMAZOLE 5 MG PO TABS
5.0000 mg | ORAL_TABLET | ORAL | Status: DC
  Administered 2015-09-08 – 2015-09-12 (×3): 5 mg via ORAL
  Filled 2015-09-06 (×3): qty 1

## 2015-09-06 MED ORDER — MAGNESIUM SULFATE 4 GM/100ML IV SOLN
4.0000 g | Freq: Once | INTRAVENOUS | Status: AC
Start: 2015-09-06 — End: 2015-09-06
  Administered 2015-09-06: 4 g via INTRAVENOUS
  Filled 2015-09-06: qty 100

## 2015-09-06 MED ORDER — HYOSCYAMINE SULFATE 0.125 MG PO TABS
0.1250 mg | ORAL_TABLET | Freq: Every day | ORAL | Status: DC
  Filled 2015-09-06 (×2): qty 1

## 2015-09-06 MED ORDER — ALBUTEROL SULFATE (2.5 MG/3ML) 0.083% IN NEBU
2.5000 mg | INHALATION_SOLUTION | RESPIRATORY_TRACT | Status: DC | PRN
  Administered 2015-09-06: 2.5 mg via RESPIRATORY_TRACT
  Filled 2015-09-06: qty 3

## 2015-09-06 MED ORDER — PIPERACILLIN-TAZOBACTAM 3.375 G IVPB
3.3750 g | Freq: Three times a day (TID) | INTRAVENOUS | Status: DC
  Administered 2015-09-06 – 2015-09-10 (×13): 3.375 g via INTRAVENOUS
  Filled 2015-09-06 (×14): qty 50

## 2015-09-06 MED ORDER — COLESTIPOL HCL 1 G PO TABS
2.0000 g | ORAL_TABLET | Freq: Every day | ORAL | Status: DC
  Administered 2015-09-06 – 2015-09-12 (×7): 2 g via ORAL
  Filled 2015-09-06 (×7): qty 2

## 2015-09-06 MED ORDER — HYOSCYAMINE SULFATE 0.125 MG SL SUBL
0.1250 mg | SUBLINGUAL_TABLET | Freq: Every day | SUBLINGUAL | Status: DC
  Administered 2015-09-06 – 2015-09-11 (×6): 0.125 mg via SUBLINGUAL
  Filled 2015-09-06 (×7): qty 1

## 2015-09-06 MED ORDER — SODIUM CHLORIDE 0.9 % IV BOLUS (SEPSIS)
1000.0000 mL | INTRAVENOUS | Status: AC
  Administered 2015-09-06 (×2): 1000 mL via INTRAVENOUS

## 2015-09-06 MED ORDER — CLOPIDOGREL BISULFATE 75 MG PO TABS
75.0000 mg | ORAL_TABLET | Freq: Every day | ORAL | Status: DC
  Administered 2015-09-06 – 2015-09-12 (×7): 75 mg via ORAL
  Filled 2015-09-06 (×7): qty 1

## 2015-09-06 MED ORDER — HYDRALAZINE HCL 20 MG/ML IJ SOLN
10.0000 mg | Freq: Four times a day (QID) | INTRAMUSCULAR | Status: DC | PRN
  Administered 2015-09-06 – 2015-09-08 (×2): 10 mg via INTRAVENOUS
  Filled 2015-09-06 (×2): qty 1

## 2015-09-06 MED ORDER — BOOST / RESOURCE BREEZE PO LIQD
1.0000 | Freq: Three times a day (TID) | ORAL | Status: DC
  Administered 2015-09-06 – 2015-09-12 (×13): 1 via ORAL

## 2015-09-06 NOTE — Progress Notes (Signed)
[facility] PROGRESS NOTE  JOEZIAH SALEM [id number] DOB: [birth year] DOA: 09/05/2015 PCP: Renato Shin, MD  Assessment/Plan: #1 systemic inflammatory response syndrome Patient presenting with nausea vomiting and noted to have a fever. Concern for possible viral gastroenteritis. Patient denies any chest pain or shortness of breath. Chest x-ray did not acute infiltrate. Urinalysis is unremarkable. Blood cultures are pending. Continue empiric IV vancomycin and IV Zosyn. Repeat chest x-ray in the morning. Continue IV fluids. Supportive care.  #2 elevated troponins May be secondary to acute infectious etiology. Patient with no chest pain. Check a 2-D echo. Consult cardiology for further evaluation and management.  #3 dehydration IV fluids.  #4 hypertension Stable. Lopressor  #5 chronic kidney disease stage III Stable.  #6 history of coronary artery disease Patient presented with nausea vomiting concern for possible anginal equivalent. Cardiac enzymes were cycled which were elevated. Patient denies any chest pain. Check a 2-D echo. Continue aspirin and Plavix.  #7 sinus tachycardia Likely secondary to dehydration. Improved with hydration. TSH and free T4 within normal limits. Will need repeat thyroid function studies in 4-6 weeks.  #8 elevated lactic acid level Patient has been pancultured. Patient IV fluids for severe dehydration. Lactic acid levels trending down. Continue IV fluids and empiric IV antibiotics.  #9 mild aortic stenosis Repeat 2-D echo.  #10 hyperthyroidism Continue Tapazole. Continue Lopressor.  #11 prophylaxis Lovenox for DVT prophylaxis.   Code Status: Full Family Communication: Updated patient and daughter at bedside. Disposition Plan: Home once acute medical issues are resolved and stable.   Consultants:  Cardiology pending  Procedures:  Chest x-ray 09/05/2015    Antibiotics:  IV Zosyn 09/06/2015  IV vancomycin  09/06/2015  HPI/Subjective: Patient denies any further nausea or emesis. Patient denies any dysphagia. Patient denies any chest pain. No shortness of breath. No dysuria.  Objective: Filed Vitals:   09/06/15 1040 09/06/15 1500  BP: 165/65 162/60  Pulse: 85 80  Temp:  98.2 F (36.8 C)  Resp:  20    Intake/Output Summary (Last 24 hours) at 09/06/15 1716 Last data filed at 09/06/15 1537  Gross per 24 hour  Intake 6197.5 ml  Output    550 ml  Net 5647.5 ml   Filed Weights   09/05/15 2032 09/06/15 0100  Weight: 70.308 kg (155 lb) 76.5 kg (168 lb 10.4 oz)    Exam:   General:  NAD. Extremely dry mucous membranes.  Cardiovascular: RRR with 3/6 SEM  Respiratory: Clear to auscultation bilaterally. No wheezes, no crackles, no rhonchi.  Abdomen: Soft, nontender, nondistended, positive bowel sounds.  Musculoskeletal: No clubbing cyanosis or edema.   Data Reviewed: Basic Metabolic Panel:  Recent Labs Lab 09/05/15 1935 09/06/15 0200  NA 138 139  K 3.8 3.5  CL 105 110  CO2 20* 20*  GLUCOSE 148* 122*  BUN 28* 25*  CREATININE 1.03 0.98  CALCIUM 9.2 8.1*  MG  --  1.2*  PHOS  --  2.1*   Liver Function Tests:  Recent Labs Lab 09/05/15 1935 09/06/15 0200  AST 47* 39  ALT 24 19  ALKPHOS 91 71  BILITOT 1.9* 1.7*  PROT 7.6 6.3*  ALBUMIN 4.2 3.5    Recent Labs Lab 09/05/15 1935  LIPASE 25   No results for input(s): AMMONIA in the last 168 hours. CBC:  Recent Labs Lab 09/05/15 1935 09/06/15 0200  WBC 8.9 5.9  NEUTROABS 8.2*  --   HGB 12.5* 10.7*  HCT 34.6* 30.0*  MCV 93.3 95.5  PLT 161 127*  Cardiac Enzymes:  Recent Labs Lab 09/06/15 0200 09/06/15 0657 09/06/15 1350  TROPONINI 0.10* 0.20* 0.24*   BNP (last 3 results) No results for input(s): BNP in the last 8760 hours.  ProBNP (last 3 results) No results for input(s): PROBNP in the last 8760 hours.  CBG: No results for input(s): GLUCAP in the last 168 hours.  Recent Results (from the  past 240 hour(s))  Culture, blood (Routine X 2) w Reflex to ID Panel     Status: None (Preliminary result)   Collection Time: 09/05/15  7:30 PM  Result Value Ref Range Status   Specimen Description BLOOD LEFT ANTECUBITAL  Final   Special Requests BOTTLES DRAWN AEROBIC AND ANAEROBIC 5ML  Final   Culture   Final    NO GROWTH < 12 HOURS Performed at [facility]    Report Status PENDING  Incomplete  Culture, blood (Routine X 2) w Reflex to ID Panel     Status: None (Preliminary result)   Collection Time: 09/05/15  8:36 PM  Result Value Ref Range Status   Specimen Description BLOOD RIGHT ANTECUBITAL  Final   Special Requests BOTTLES DRAWN AEROBIC AND ANAEROBIC 5ML  Final   Culture   Final    NO GROWTH < 12 HOURS Performed at [facility]    Report Status PENDING  Incomplete     Studies: Dg Chest 2 View  09/05/2015  CLINICAL DATA:  82 year old male with cough and nausea EXAM: CHEST  2 VIEW COMPARISON:  Radiograph dated 03/28/2015 FINDINGS: Two views of the chest demonstrate minimal atelectatic changes of the left lung base. No focal consolidation, pleural effusion, or pneumothorax. Stable cardiac silhouette. No acute osseous pathology. IMPRESSION: Left lung base minimal atelectatic changes. Pneumonia is less likely. Electronically Signed   By: Anner Crete M.D.   On: 09/05/2015 21:29    Scheduled Meds: . aspirin EC  81 mg Oral Daily  . clopidogrel  75 mg Oral Q breakfast  . colestipol  2 g Oral Daily  . enoxaparin (LOVENOX) injection  40 mg Subcutaneous Q24H  . feeding supplement  1 Container Oral TID BM  . hyoscyamine  0.125 mg Oral QHS  . [START ON 09/08/2015] methimazole  5 mg Oral QODAY  . metoprolol tartrate  12.5 mg Oral BID  . piperacillin-tazobactam (ZOSYN)  IV  3.375 g Intravenous Q8H  . sodium chloride flush  3 mL Intravenous Q12H  . vancomycin  500 mg Intravenous Q12H   Continuous Infusions: . sodium chloride 75 mL/hr at 09/06/15 [id number]    Principal  Problem:   SIRS (systemic inflammatory response syndrome) (HCC) Active Problems:   Hyperlipidemia   Essential hypertension   CAD (coronary artery disease)   Carotid artery disease (HCC)   Aortic stenosis   Sinus tachycardia (HCC)   CKD (chronic kidney disease) stage 3, GFR 30-59 ml/min   Mitral regurgitation   Dehydration   Elevated troponin    Time spent: 19 minutes    Arionne Iams M.D. [facility] Pager [phone]. If 7PM-7AM, please contact night-coverage at [email or web address], password [facility] 09/06/2015, 5:16 PM

## 2015-09-06 NOTE — Progress Notes (Addendum)
Pt received fluid bolus per sepsis protocol. Vital signs post-bolus: BP 183/64, HR 91, temperature 100.5. On-call paged, given PRN order for hydralazine for elevated BP. Will continue to monitor.

## 2015-09-06 NOTE — Consult Note (Signed)
CARDIOLOGY CONSULT NOTE     Primary Care Physician: Renato Shin, MD Referring Physician:  Dr Roel Cluck  Admit Date: 09/05/2015  Reason for consultation:  Elevated troponin without cardiac symptoms in a 82 yo with medical illness  Kurt Elliott is a 82 y.o. male with a h/o known CAD, HTN, and mild aortic stenosis.  He is admitted with acute febrile medical illness.  He was felt to be dehydrated and was noted to have nausea and vomiting.  He was admitted for IV hydration. No cardiac symptoms recently.  Cath 7/16 revealed stable CAD with mild AS.   Today, he denies symptoms of palpitations, chest pain,  orthopnea, PND, lower extremity edema, dizziness, presyncope, syncope, or neurologic sequela. He has some SOB which is stable.  The patient is tolerating medications without difficulties and is otherwise without complaint today.   Past Medical History  Diagnosis Date  . Hypertension   . Hyperlipemia   . Unspecified disorder of liver   . Cellulitis, leg     left  . Pulmonary nodule, right     2008 / unchanged CT scan, July, 2009  . Adenocarcinoma of prostate ([place])   . Esophageal stricture   . Spinal stenosis   . Hyperglycemia   . Hyperthyroidism   . GERD (gastroesophageal reflux disease)   . Hx of colonic polyps   . Renal artery stenosis (HCC)     Mild  . Atheroma     no penetrating ulcer, Abdominal aorta  . Inferior mesenteric artery injury     High-grade stenosis, CT scan, 2006  . Vertigo   . Diverticulum   . Incisional hernia   . Rash     Ticlid  . Cough     ACE Inhibitor, tolerates ARB  . Carotid artery disease ([place])     Doppler, 2006, minimal disease  //  Doppler, May, 2012, mild increase velocities, 40-59% bilateral stenoses  . Renal cyst     Hyperdense cyst lower pole left kidney  . CAD (coronary artery disease), native coronary artery     a. cath 2008  b. cath 10/21/2014 3v dzs w/ patent RCA stent, severe stenosis of mid LCx/OM1 s/p DES, moderately severe  diffuse mid LAD treated with DES, patent prox LAD stent;  c. 01/2015 Cath: patent LAD, LCX, OM, and RCA stents->Med Rx.  . Mild aortic stenosis     a. 09/2014 Echo: EF 65-70%, Gr 1 DD, no rwma, mild AS;  b. 01/2015 Echo: EF 65-70%, mod LVH, no rwma, 35mmHg intracavitary gradient, mildly dil LA.   Past Surgical History  Procedure Laterality Date  . Esophagogastroduodenoscopy    . Cardiac catheterization    . Incisional hernia repair    . Left heart catheterization with coronary angiogram N/A 10/21/2014    Procedure: LEFT HEART CATHETERIZATION WITH CORONARY ANGIOGRAM;  Surgeon: Sherren Mocha, MD;  Location: [facility] CATH LAB;  Service: Cardiovascular;  Laterality: N/A;  . Percutaneous coronary stent intervention (pci-s)  10/21/2014    Procedure: PERCUTANEOUS CORONARY STENT INTERVENTION (PCI-S);  Surgeon: Sherren Mocha, MD;  Location: [facility] CATH LAB;  Service: Cardiovascular;;  Mid LAD and OM1  . Sigmoid resection / rectopexy    . Cardiac catheterization N/A 02/18/2015    Procedure: Right/Left Heart Cath and Coronary Angiography;  Surgeon: Leonie Man, MD;  Location: [facility] CV LAB;  Service: Cardiovascular;  Laterality: N/A;    . aspirin EC  81 mg Oral Daily  . clopidogrel  75 mg Oral Q breakfast  .  colestipol  2 g Oral Daily  . enoxaparin (LOVENOX) injection  40 mg Subcutaneous Q24H  . feeding supplement  1 Container Oral TID BM  . hyoscyamine  0.125 mg Oral QHS  . [START ON 09/08/2015] methimazole  5 mg Oral QODAY  . metoprolol tartrate  12.5 mg Oral BID  . piperacillin-tazobactam (ZOSYN)  IV  3.375 g Intravenous Q8H  . sodium chloride flush  3 mL Intravenous Q12H  . vancomycin  500 mg Intravenous Q12H   . sodium chloride 75 mL/hr at 09/06/15 0904    Allergies  Allergen Reactions  . Isosorbide Other (See Comments)    "severe Headache" per patient  . Lisinopril Cough  . Ticlopidine Hcl Rash    Social History   Social History  . Marital Status: Married    Spouse Name: N/A  .  Number of Children: 4  . Years of Education: N/A   Occupational History  . 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 Smoking status: Never Smoker   . Smokeless tobacco: Never Used  . Alcohol Use: No  . Drug Use: No  . Sexual Activity: Not on file   Other Topics Concern  . Not on file   Social History Narrative    Family History  Problem Relation Age of Onset  . Breast cancer Sister     2 sisters  . Pemphigus vulgaris Mother     died at 79  . Heart attack Father   . Hypertension Mother   . Hypertension Father   . Hypertension Sister   . Hypertension Brother   . Stroke Father     ROS- All systems are reviewed and negative except as per the HPI above  Physical Exam: Telemetry:  Sinus rhythm, no arrhythmias Filed Vitals:   09/06/15 1031 09/06/15 1035 09/06/15 1040 09/06/15 1500  BP: 163/61 156/65 165/65 162/60  Pulse: 84 83 85 80  Temp: 98 F (36.7 C)   98.2 F (36.8 C)  TempSrc: Oral   Oral  Resp: 22   20  Height:      Weight:      SpO2: 93%   96%    GEN- The patient is very elderly and frail appearing, alert and oriented x 3 today.   Head- normocephalic, atraumatic Eyes-  Sclera clear, conjunctiva pink Ears- hearing intact Oropharynx- clear Neck- supple, no JVP Lungs- Clear to ausculation bilaterally, normal work of breathing Heart- Regular rate and rhythm, 2/6 SEM LUSB which is early peaking, S2 is very audible GI- soft, NT, ND, + BS Extremities- no clubbing, cyanosis, + dependant edema MS- diffuse muscle atrophy Skin- no rash or lesion Psych- euthymic mood, full affect Neuro- strength and sensation are intact  EKG: reveals sinus rhythm, no dynamic ekg changes  Labs:   Lab Results  Component Value Date   WBC 5.9 09/06/2015   HGB 10.7* 09/06/2015   HCT 30.0* 09/06/2015   MCV 95.5 09/06/2015   PLT 127* 09/06/2015    Recent Labs Lab 09/06/15 0200  NA 139  K 3.5  CL 110  CO2 20*  BUN 25*  CREATININE 0.98  CALCIUM 8.1*    PROT 6.3*  BILITOT 1.7*  ALKPHOS 71  ALT 19  AST 39  GLUCOSE 122*   Lab Results  Component Value Date   TROPONINI 0.24* 09/06/2015    Lab Results  Component Value Date   CHOL 149 02/18/2015   CHOL 161 12/28/2013   CHOL 178 06/01/2011   Lab Results  Component Value Date   HDL 43 02/18/2015   HDL 37.50* 12/28/2013   HDL 44.50 06/01/2011   Lab Results  Component Value Date   LDLCALC 96 02/18/2015   LDLCALC 72 12/28/2013   LDLCALC 57 06/12/2007   Lab Results  Component Value Date   TRIG 51 02/18/2015   TRIG 258.0* 12/28/2013   TRIG 377.0* 06/01/2011   Lab Results  Component Value Date   CHOLHDL 3.5 02/18/2015   CHOLHDL 4 12/28/2013   CHOLHDL 4 06/01/2011   Lab Results  Component Value Date   LDLDIRECT 97.5 06/01/2011   LDLDIRECT 96.2 04/20/2010   LDLDIRECT 103.2 03/11/2009     Echo and cath from 2016 reviewed  ASSESSMENT AND PLAN:   1. Elevated troponin Very minor elevation without ekg changes or any symptoms of ischemia.  Due to demand ischemia in setting of febrile illness with lactic acidosis.   No indication for any further CV testing.  Would not order any further cardiac markers in this elderly patient without clear symptoms of ischemia.  2. Mild aortic stenosis Asymptomatic Cath and echo reviewed from 2016.   Mild by exam today No indication for echo in this very elderly gentleman.  I have cancelled the echo that was ordered  3. CAD Known CAD with stable disease by cath in 2016. Would continue prior medical therapy and planned follow-up with Dr Burt Knack in April  4. HTN Stable Primary team to manage  5. Dehydration Gentle hydration  Given advanced age and fragility, a conservative approach is recommended  Cardiology team to see as needed while here. Please call with questions.    Thompson Grayer, MD 09/06/2015  3:54 PM

## 2015-09-07 ENCOUNTER — Inpatient Hospital Stay (HOSPITAL_COMMUNITY): Payer: Medicare Other

## 2015-09-07 DIAGNOSIS — J189 Pneumonia, unspecified organism: Secondary | ICD-10-CM

## 2015-09-07 DIAGNOSIS — J181 Lobar pneumonia, unspecified organism: Secondary | ICD-10-CM

## 2015-09-07 LAB — URINE CULTURE

## 2015-09-07 LAB — BASIC METABOLIC PANEL
Anion gap: 7 (ref 5–15)
BUN: 20 mg/dL (ref 6–20)
CALCIUM: 7.8 mg/dL — AB (ref 8.9–10.3)
CHLORIDE: 111 mmol/L (ref 101–111)
CO2: 19 mmol/L — AB (ref 22–32)
CREATININE: 0.86 mg/dL (ref 0.61–1.24)
GFR calc Af Amer: >60 mL/min (ref >60)
GFR calc non Af Amer: >60 mL/min (ref >60)
Glucose, Bld: 105 mg/dL — ABNORMAL HIGH (ref 65–99)
Potassium: 3.5 mmol/L (ref 3.5–5.1)
SODIUM: 137 mmol/L (ref 135–145)

## 2015-09-07 LAB — CBC
HCT: 28.6 % — ABNORMAL LOW (ref 39.0–52.0)
Hemoglobin: 10.3 g/dL — ABNORMAL LOW (ref 13.0–17.0)
MCH: 33.3 pg (ref 26.0–34.0)
MCHC: 36 g/dL (ref 30.0–36.0)
MCV: 92.6 fL (ref 78.0–100.0)
PLATELETS: 120 10*3/uL — AB (ref 150–400)
RBC: 3.09 MIL/uL — ABNORMAL LOW (ref 4.22–5.81)
RDW: 13.4 % (ref 11.5–15.5)
WBC: 7.3 10*3/uL (ref 4.0–10.5)

## 2015-09-07 LAB — MAGNESIUM: MAGNESIUM: 2.2 mg/dL (ref 1.7–2.4)

## 2015-09-07 MED ORDER — LORAZEPAM 2 MG/ML IJ SOLN
0.5000 mg | Freq: Once | INTRAMUSCULAR | Status: AC
  Administered 2015-09-07: 0.5 mg via INTRAVENOUS
  Filled 2015-09-07: qty 1

## 2015-09-07 MED ORDER — LEVALBUTEROL HCL 0.63 MG/3ML IN NEBU
0.6300 mg | INHALATION_SOLUTION | RESPIRATORY_TRACT | Status: DC | PRN
  Administered 2015-09-07 – 2015-09-10 (×6): 0.63 mg via RESPIRATORY_TRACT
  Filled 2015-09-07 (×8): qty 3

## 2015-09-07 MED ORDER — LEVALBUTEROL HCL 0.63 MG/3ML IN NEBU
0.6300 mg | INHALATION_SOLUTION | Freq: Three times a day (TID) | RESPIRATORY_TRACT | Status: DC
  Administered 2015-09-07 – 2015-09-10 (×8): 0.63 mg via RESPIRATORY_TRACT
  Filled 2015-09-07 (×6): qty 3

## 2015-09-07 MED ORDER — GUAIFENESIN ER 600 MG PO TB12
1200.0000 mg | ORAL_TABLET | Freq: Two times a day (BID) | ORAL | Status: DC
Start: 2015-09-07 — End: 2015-09-12
  Administered 2015-09-07 – 2015-09-12 (×9): 1200 mg via ORAL
  Filled 2015-09-07 (×13): qty 2

## 2015-09-07 MED ORDER — ZOLPIDEM TARTRATE 5 MG PO TABS
5.0000 mg | ORAL_TABLET | Freq: Once | ORAL | Status: AC
  Administered 2015-09-07: 5 mg via ORAL
  Filled 2015-09-07: qty 1

## 2015-09-07 MED ORDER — AMLODIPINE BESYLATE 2.5 MG PO TABS
2.5000 mg | ORAL_TABLET | Freq: Every day | ORAL | Status: DC
  Administered 2015-09-07 – 2015-09-08 (×2): 2.5 mg via ORAL
  Filled 2015-09-07 (×3): qty 1

## 2015-09-07 MED ORDER — LEVALBUTEROL HCL 0.63 MG/3ML IN NEBU
0.6300 mg | INHALATION_SOLUTION | Freq: Four times a day (QID) | RESPIRATORY_TRACT | Status: DC
  Administered 2015-09-07: 0.63 mg via RESPIRATORY_TRACT
  Filled 2015-09-07: qty 3

## 2015-09-07 MED ORDER — LORATADINE 10 MG PO TABS
10.0000 mg | ORAL_TABLET | Freq: Every day | ORAL | Status: DC
  Administered 2015-09-08 – 2015-09-12 (×5): 10 mg via ORAL
  Filled 2015-09-07 (×6): qty 1

## 2015-09-07 NOTE — Evaluation (Signed)
Physical Therapy Evaluation Patient Details Name: Kurt Elliott MRN: [id number] DOB: 10/25/1920 Today's Date: 09/07/2015   History of Present Illness  82 yo male admitted with SIRS. Hx of HTN, vertigo, unsteady gait, aortic stenosis, spinal stenosis, CKD, CAD, arthritis  Clinical Impression  On eval, pt required Min assist for mobility-walked ~65 feet with RW. O2 sats 97% RA at rest, 96% RA during ambulation. Audible wheezing noted when active. Dyspnea 2/4 while walking. Family present during session-plan is for pt to return home.     Follow Up Recommendations Home health PT;Supervision/Assistance - 24 hour    Equipment Recommendations  None recommended by PT    Recommendations for Other Services       Precautions / Restrictions Precautions Precautions: Fall Restrictions Weight Bearing Restrictions: No      Mobility  Bed Mobility Overal bed mobility: Needs Assistance Bed Mobility: Sit to Supine       Sit to supine: Mod assist   General bed mobility comments: assist for LEs. Increased time.   Transfers Overall transfer level: Needs assistance Equipment used: Rolling walker (2 wheeled) Transfers: Sit to/from Stand Sit to Stand: Min assist         General transfer comment: assist to rise, stabilize, control descent. Vcs hand placement  Ambulation/Gait Ambulation/Gait assistance: Min assist Ambulation Distance (Feet): 65 Feet Assistive device: Rolling walker (2 wheeled) Gait Pattern/deviations: Trunk flexed;Decreased stride length     General Gait Details: small amount of assist to stabilize. slow gait speed. O2 sats 96% RA but audible wheezing noted. dyspnea 2/4  Stairs            Wheelchair Mobility    Modified Rankin (Stroke Patients Only)       Balance Overall balance assessment: Needs assistance         Standing balance support: Bilateral upper extremity supported;During functional activity Standing balance-Leahy Scale: Poor                                Pertinent Vitals/Pain Pain Assessment: Faces Faces Pain Scale: Hurts little more Pain Location: arthritis in hips Pain Descriptors / Indicators: Aching Pain Intervention(s): Monitored during session    Home Living Family/patient expects to be discharged to:: Private residence Living Arrangements: Spouse/significant other Available Help at Discharge: Family. Aide daily 4-6 hours Type of Home: House Home Access: Stairs to enter Entrance Stairs-Rails: Right Entrance Stairs-Number of Steps: 3 Home Layout: One level Home Equipment: Walker - 4 wheels;Cane - single point;Shower seat;Bedside commode      Prior Function Level of Independence: Independent               Hand Dominance        Extremity/Trunk Assessment   Upper Extremity Assessment: Generalized weakness           Lower Extremity Assessment: Generalized weakness      Cervical / Trunk Assessment: Kyphotic  Communication   Communication: HOH  Cognition Arousal/Alertness: Awake/alert Behavior During Therapy: WFL for tasks assessed/performed Overall Cognitive Status: Within Functional Limits for tasks assessed                      General Comments      Exercises        Assessment/Plan    PT Assessment Patient needs continued PT services  PT Diagnosis Difficulty walking;Generalized weakness   PT Problem List Decreased strength;Decreased activity tolerance;Decreased balance;Decreased mobility;Decreased knowledge of use of  DME;Pain  PT Treatment Interventions DME instruction;Gait training;Functional mobility training;Therapeutic activities;Patient/family education;Balance training;Therapeutic exercise   PT Goals (Current goals can be found in the Care Plan section) Acute Rehab PT Goals Patient Stated Goal: home soon PT Goal Formulation: With patient/family Time For Goal Achievement: 09/21/15 Potential to Achieve Goals: Fair    Frequency Min 3X/week    Barriers to discharge        Co-evaluation               End of Session Equipment Utilized During Treatment: Gait belt Activity Tolerance: Patient tolerated treatment well Patient left: in bed;with call bell/phone within reach;with family/visitor present           Time: 1416-1440 PT Time Calculation (min) (ACUTE ONLY): 24 min   Charges:   PT Evaluation $PT Eval Moderate Complexity: 1 Procedure     PT G Codes:        Weston Anna, MPT Pager: [phone]

## 2015-09-07 NOTE — Progress Notes (Signed)
Initial Nutrition Assessment  INTERVENTION:   Continue Boost Breeze po TID, each supplement provides 250 kcal and 9 grams of protein Provide daily snacks RD to continue to monitor  NUTRITION DIAGNOSIS:   Inadequate oral intake related to other (see comment) (advanced age) as evidenced by per patient/family report.  GOAL:   Patient will meet greater than or equal to 90% of their needs  MONITOR:   PO intake, Supplement acceptance, Labs, Weight trends, I & O's  REASON FOR ASSESSMENT:   Malnutrition Screening Tool    ASSESSMENT:   82 y.o. male with a h/o known CAD, HTN, and mild aortic stenosis. He is admitted with acute febrile medical illness. He was felt to be dehydrated and was noted to have nausea and vomiting. He was admitted for IV hydration.  Patient in room with daughter at bedside. Per pt, he usually eats small frequent meals a day. PO intakes: 25-50%. Pt states he cannot eat much at one time. He would like snacks in between meals, RD to order. Pt would like to continue to receive Boost Breeze. Per pt's daughter, his main issue is drinking enough fluids. She denies pt has had any appetite changes.  Per weight history, pt's weight is +13 lb since admission.   Pt with mild muscle depletion.   Labs and meds reviewed.  Diet Order:  DIET SOFT Room service appropriate?: Yes; Fluid consistency:: Thin  Skin:  Reviewed, no issues  Last BM:  2/11  Height:   Ht Readings from Last 1 Encounters:  09/06/15 5' 10.5" (1.791 m)    Weight:   Wt Readings from Last 1 Encounters:  09/06/15 168 lb 10.4 oz (76.5 kg)    Ideal Body Weight:  78.2 kg  BMI:  Body mass index is 23.85 kg/(m^2).  Estimated Nutritional Needs:   Kcal:  1800-2000  Protein:  85-95g  Fluid:  1.8-2L/day  EDUCATION NEEDS:   No education needs identified at this time  Clayton Bibles, MS, RD, LDN Pager: [phone] After Hours Pager: [phone]

## 2015-09-07 NOTE — Progress Notes (Signed)
[facility] PROGRESS NOTE  Kurt Elliott [id number] DOB: 11/01/1920 DOA: 09/05/2015 PCP: Renato Shin, MD  Assessment/Plan: #1 systemic inflammatory response syndrome Patient presenting with nausea vomiting and noted to have a fever. Concern for possible viral gastroenteritis. Also concern for possible pneumonia as repeat chest x-ray with possible left basilar retrocardiac capacity-question atelectasis versus pneumonia. Patient denies any chest pain or shortness of breath. Patient stated had productive cough last night and today after nebulizer treatments. Urinalysis is unremarkable. Blood cultures are pending. Continue empiric IV vancomycin and IV Zosyn. Continue IV fluids. Supportive care.  #2 probable pneumonia The repeat chest x-ray showing possible left basilar retrocardiac opacity-atelectasis versus pneumonia. Check a urine Legionella antigen. Check a urine pneumococcus antigen. Check a sputum Gram stain and culture. Place patient on Mucinex, nebulizer treatments. Continue empiric IV vancomycin and Zosyn.   #3 elevated troponins May be secondary to acute infectious etiology. Patient with no chest pain. Patient has been seen in consultation by cardiology who feel no further cardiac workup is needed. 2-D echo has been canceled per cardiology.   #4 dehydration IV fluids.  #5 hypertension Stable. Lopressor will add low-dose Norvasc for better blood pressure control.  #6 chronic kidney disease stage III Stable.  #7 history of coronary artery disease Patient presented with nausea vomiting concern for possible anginal equivalent. Cardiac enzymes were cycled which were elevated. Patient denies any chest pain. 2-D echo has been counseled per cardiology. Continue aspirin and Plavix.  #8 sinus tachycardia Likely secondary to dehydration. Improved with hydration. TSH and free T4 within normal limits. Will need repeat thyroid function studies in 4-6 weeks.  #9 elevated lactic acid  level Patient has been pancultured. Patient IV fluids for severe dehydration. Lactic acid levels trending down. Continue IV fluids and empiric IV antibiotics.  #10 mild aortic stenosis 2-D echo canceled per cardiology. Continue low-dose Lopressor.  #11 hyperthyroidism Continue Tapazole. Continue Lopressor.  #12 prophylaxis Lovenox for DVT prophylaxis.   Code Status: Full Family Communication: Updated patient and daughter at bedside. Disposition Plan: Home once acute medical issues are resolved and stable.   Consultants:  Cardiology: Dr. Rayann Heman 09/06/2015  Procedures:  Chest x-ray 09/05/2015,09/07/2015    Antibiotics:  IV Zosyn 09/06/2015  IV vancomycin 09/06/2015  HPI/Subjective: Patient denies any further nausea or emesis. Patient denies any dysphagia. Patient denies any chest pain. No shortness of breath. Patient tolerating current diet. Patient states has started to have productive sputum.  Objective: Filed Vitals:   09/06/15 2214 09/07/15 0353  BP: 160/55 173/60  Pulse: 89 79  Temp: 98.6 F (37 C) 98 F (36.7 C)  Resp: 18 18    Intake/Output Summary (Last 24 hours) at 09/07/15 1212 Last data filed at 09/07/15 1000  Gross per 24 hour  Intake 2849.17 ml  Output    551 ml  Net 2298.17 ml   Filed Weights   09/05/15 2032 09/06/15 0100  Weight: 70.308 kg (155 lb) 76.5 kg (168 lb 10.4 oz)    Exam:   General:  NAD. Extremely dry mucous membranes.  Cardiovascular: RRR with 3/6 SEM  Respiratory: Clear to auscultation bilaterally. No wheezes, no crackles, no rhonchi.  Abdomen: Soft, nontender, nondistended, positive bowel sounds.  Musculoskeletal: No clubbing cyanosis or edema.   Data Reviewed: Basic Metabolic Panel:  Recent Labs Lab 09/05/15 1935 09/06/15 0200 09/07/15 0554  NA 138 139 137  K 3.8 3.5 3.5  CL 105 110 111  CO2 20* 20* 19*  GLUCOSE 148* 122* 105*  BUN 28*  25* 20  CREATININE 1.03 0.98 0.86  CALCIUM 9.2 8.1* 7.8*  MG  --   1.2* 2.2  PHOS  --  2.1*  --    Liver Function Tests:  Recent Labs Lab 09/05/15 1935 09/06/15 0200  AST 47* 39  ALT 24 19  ALKPHOS 91 71  BILITOT 1.9* 1.7*  PROT 7.6 6.3*  ALBUMIN 4.2 3.5    Recent Labs Lab 09/05/15 1935  LIPASE 25   No results for input(s): AMMONIA in the last 168 hours. CBC:  Recent Labs Lab 09/05/15 1935 09/06/15 0200 09/07/15 0554  WBC 8.9 5.9 7.3  NEUTROABS 8.2*  --   --   HGB 12.5* 10.7* 10.3*  HCT 34.6* 30.0* 28.6*  MCV 93.3 95.5 92.6  PLT 161 127* 120*   Cardiac Enzymes:  Recent Labs Lab 09/06/15 0200 09/06/15 0657 09/06/15 1350  TROPONINI 0.10* 0.20* 0.24*   BNP (last 3 results) No results for input(s): BNP in the last 8760 hours.  ProBNP (last 3 results) No results for input(s): PROBNP in the last 8760 hours.  CBG: No results for input(s): GLUCAP in the last 168 hours.  Recent Results (from the past 240 hour(s))  Culture, blood (Routine X 2) w Reflex to ID Panel     Status: None (Preliminary result)   Collection Time: 09/05/15  7:30 PM  Result Value Ref Range Status   Specimen Description BLOOD LEFT ANTECUBITAL  Final   Special Requests BOTTLES DRAWN AEROBIC AND ANAEROBIC 5ML  Final   Culture   Final    NO GROWTH 2 DAYS Performed at [facility]    Report Status PENDING  Incomplete  Culture, blood (Routine X 2) w Reflex to ID Panel     Status: None (Preliminary result)   Collection Time: 09/05/15  8:36 PM  Result Value Ref Range Status   Specimen Description BLOOD RIGHT ANTECUBITAL  Final   Special Requests BOTTLES DRAWN AEROBIC AND ANAEROBIC 5ML  Final   Culture   Final    NO GROWTH 2 DAYS Performed at [facility]    Report Status PENDING  Incomplete  Culture, expectorated sputum-assessment     Status: None   Collection Time: 09/06/15  9:30 PM  Result Value Ref Range Status   Specimen Description SPU  Final   Special Requests NONE  Final   Sputum evaluation   Final    MICROSCOPIC FINDINGS  SUGGEST THAT THIS SPECIMEN IS NOT REPRESENTATIVE OF LOWER RESPIRATORY SECRETIONS. PLEASE RECOLLECT. Felecia Shelling [facility] [id number] [place],N    Report Status 09/06/2015 FINAL  Final     Studies: Dg Chest 2 View  09/05/2015  CLINICAL DATA:  82 year old male with cough and nausea EXAM: CHEST  2 VIEW COMPARISON:  Radiograph dated 03/28/2015 FINDINGS: Two views of the chest demonstrate minimal atelectatic changes of the left lung base. No focal consolidation, pleural effusion, or pneumothorax. Stable cardiac silhouette. No acute osseous pathology. IMPRESSION: Left lung base minimal atelectatic changes. Pneumonia is less likely. Electronically Signed   By: Anner Crete M.D.   On: 09/05/2015 21:29   Dg Chest Port 1 View  09/07/2015  CLINICAL DATA:  82 year old male with fever. History of prostate cancer. EXAM: PORTABLE CHEST 1 VIEW COMPARISON:  09/05/2015 and prior exams FINDINGS: This is a mildly low volume film. Upper limits normal heart size identified. Possible left basilar retrocardiac opacity identified -question atelectasis or airspace disease. There is no evidence of pneumothorax or large pleural effusion. IMPRESSION: Possible left basilar retrocardiac capacity -  question atelectasis or airspace disease/ pneumonia. Electronically Signed   By: Margarette Canada M.D.   On: 09/07/2015 07:17    Scheduled Meds: . aspirin EC  81 mg Oral Daily  . clopidogrel  75 mg Oral Q breakfast  . colestipol  2 g Oral Daily  . enoxaparin (LOVENOX) injection  40 mg Subcutaneous Q24H  . feeding supplement  1 Container Oral TID BM  . guaiFENesin  1,200 mg Oral BID  . hyoscyamine  0.125 mg Sublingual QHS  . levalbuterol  0.63 mg Nebulization Q6H  . loratadine  10 mg Oral Daily  . [START ON 09/08/2015] methimazole  5 mg Oral QODAY  . metoprolol tartrate  12.5 mg Oral BID  . piperacillin-tazobactam (ZOSYN)  IV  3.375 g Intravenous Q8H  . sodium chloride flush  3 mL Intravenous Q12H  . vancomycin  500 mg Intravenous  Q12H   Continuous Infusions: . sodium chloride 75 mL/hr at 09/06/15 2132    Principal Problem:   SIRS (systemic inflammatory response syndrome) (HCC) Active Problems:   Hyperlipidemia   Essential hypertension   CAD (coronary artery disease)   Carotid artery disease (HCC)   Aortic stenosis   Sinus tachycardia (HCC)   CKD (chronic kidney disease) stage 3, GFR 30-59 ml/min   Mitral regurgitation   Dehydration   Elevated troponin    Time spent: 16 minutes    THOMPSON,DANIEL M.D. [facility] Pager [phone]. If 7PM-7AM, please contact night-coverage at [email or web address], password [facility] 09/07/2015, 12:12 PM  LOS: 1 day

## 2015-09-08 ENCOUNTER — Telehealth: Payer: Self-pay | Admitting: Endocrinology

## 2015-09-08 LAB — BASIC METABOLIC PANEL
ANION GAP: 10 (ref 5–15)
BUN: 16 mg/dL (ref 6–20)
CO2: 17 mmol/L — ABNORMAL LOW (ref 22–32)
Calcium: 8.1 mg/dL — ABNORMAL LOW (ref 8.9–10.3)
Chloride: 109 mmol/L (ref 101–111)
Creatinine, Ser: 0.59 mg/dL — ABNORMAL LOW (ref 0.61–1.24)
GLUCOSE: 117 mg/dL — AB (ref 65–99)
POTASSIUM: 3 mmol/L — AB (ref 3.5–5.1)
Sodium: 136 mmol/L (ref 135–145)

## 2015-09-08 LAB — CBC
HEMATOCRIT: 26.1 % — AB (ref 39.0–52.0)
Hemoglobin: 9.5 g/dL — ABNORMAL LOW (ref 13.0–17.0)
MCH: 33.7 pg (ref 26.0–34.0)
MCHC: 36.4 g/dL — ABNORMAL HIGH (ref 30.0–36.0)
MCV: 92.6 fL (ref 78.0–100.0)
PLATELETS: 121 10*3/uL — AB (ref 150–400)
RBC: 2.82 MIL/uL — AB (ref 4.22–5.81)
RDW: 13.1 % (ref 11.5–15.5)
WBC: 6.2 10*3/uL (ref 4.0–10.5)

## 2015-09-08 LAB — T3: T3, Total: 84 ng/dL (ref 71–180)

## 2015-09-08 LAB — STREP PNEUMONIAE URINARY ANTIGEN: STREP PNEUMO URINARY ANTIGEN: NEGATIVE

## 2015-09-08 LAB — HIV ANTIBODY (ROUTINE TESTING W REFLEX): HIV Screen 4th Generation wRfx: NONREACTIVE

## 2015-09-08 LAB — MAGNESIUM: Magnesium: 1.8 mg/dL (ref 1.7–2.4)

## 2015-09-08 MED ORDER — POTASSIUM CHLORIDE CRYS ER 20 MEQ PO TBCR
40.0000 meq | EXTENDED_RELEASE_TABLET | ORAL | Status: AC
  Administered 2015-09-08: 40 meq via ORAL
  Filled 2015-09-08: qty 2

## 2015-09-08 MED ORDER — HALOPERIDOL LACTATE 5 MG/ML IJ SOLN
2.0000 mg | Freq: Once | INTRAMUSCULAR | Status: AC
  Administered 2015-09-08: 2 mg via INTRAVENOUS
  Filled 2015-09-08: qty 1

## 2015-09-08 MED ORDER — SODIUM CHLORIDE 0.9 % IV BOLUS (SEPSIS)
250.0000 mL | Freq: Once | INTRAVENOUS | Status: AC
  Administered 2015-09-08: 250 mL via INTRAVENOUS

## 2015-09-08 MED ORDER — AMLODIPINE BESYLATE 5 MG PO TABS
5.0000 mg | ORAL_TABLET | Freq: Every day | ORAL | Status: DC
  Administered 2015-09-09 – 2015-09-12 (×4): 5 mg via ORAL
  Filled 2015-09-08 (×4): qty 1

## 2015-09-08 MED ORDER — BUDESONIDE 0.25 MG/2ML IN SUSP
0.2500 mg | Freq: Two times a day (BID) | RESPIRATORY_TRACT | Status: DC
  Administered 2015-09-08 – 2015-09-10 (×4): 0.25 mg via RESPIRATORY_TRACT
  Filled 2015-09-08 (×4): qty 2

## 2015-09-08 MED ORDER — ARFORMOTEROL TARTRATE 15 MCG/2ML IN NEBU
15.0000 ug | INHALATION_SOLUTION | Freq: Two times a day (BID) | RESPIRATORY_TRACT | Status: DC
  Administered 2015-09-08 – 2015-09-10 (×4): 15 ug via RESPIRATORY_TRACT
  Filled 2015-09-08 (×5): qty 2

## 2015-09-08 MED ORDER — DIPHENHYDRAMINE HCL 50 MG/ML IJ SOLN
12.5000 mg | Freq: Once | INTRAMUSCULAR | Status: AC
  Administered 2015-09-08: 12.5 mg via INTRAVENOUS
  Filled 2015-09-08 (×2): qty 1

## 2015-09-08 MED ORDER — LORAZEPAM 2 MG/ML IJ SOLN: 0.5000 mg | Freq: Once | INTRAMUSCULAR | Status: DC

## 2015-09-08 MED ORDER — POTASSIUM CHLORIDE CRYS ER 20 MEQ PO TBCR
40.0000 meq | EXTENDED_RELEASE_TABLET | Freq: Once | ORAL | Status: DC
  Filled 2015-09-08: qty 2

## 2015-09-08 MED ORDER — METHYLPREDNISOLONE SODIUM SUCC 125 MG IJ SOLR
60.0000 mg | Freq: Once | INTRAMUSCULAR | Status: AC
  Administered 2015-09-08: 60 mg via INTRAVENOUS
  Filled 2015-09-08: qty 0.96

## 2015-09-08 MED ORDER — AMLODIPINE BESYLATE 2.5 MG PO TABS
2.5000 mg | ORAL_TABLET | Freq: Once | ORAL | Status: DC
  Filled 2015-09-08: qty 1

## 2015-09-08 NOTE — Evaluation (Signed)
Occupational Therapy Evaluation Patient Details Name: Kurt Elliott MRN: [id number] DOB: 1920/12/02 Today's Date: 09/08/2015    History of Present Illness 82 yo male admitted with SIRS. Hx of HTN, vertigo, unsteady gait, aortic stenosis, spinal stenosis, CKD, CAD, arthritis   Clinical Impression   Pt admitted with SIRS. Pt currently with functional limitations due to the deficits listed below (see OT Problem List).  Pt will benefit from skilled OT to increase their safety and independence with ADL and functional mobility for ADL to facilitate discharge to venue listed below.      Follow Up Recommendations  SNF    Equipment Recommendations  None recommended by OT       Precautions / Restrictions Precautions Precautions: Fall Restrictions Weight Bearing Restrictions: No      Mobility Bed Mobility Overal bed mobility: Needs Assistance Bed Mobility: Sit to Supine       Sit to supine: Mod assist   General bed mobility comments: assist for LEs. Increased time.   Transfers Overall transfer level: Needs assistance Equipment used: 2 person hand held assist Transfers: Sit to/from [facility] Sit to Stand: Mod assist;+2 physical assistance;+2 safety/equipment Stand pivot transfers: Mod assist;+2 physical assistance;+2 safety/equipment       General transfer comment: assist to rise, stabilize, control descent. Vcs hand placement         ADL Overall ADL's : Needs assistance/impaired                             Toileting- Clothing Manipulation and Hygiene: Sit to/from stand;Cueing for sequencing;Cueing for safety;+2 for physical assistance;+2 for safety/equipment Toileting - Clothing Manipulation Details (indicate cue type and reason): sit to stand with urinal       General ADL Comments: pt VERY sleepy.  Needed max Vc to stay awake               Pertinent Vitals/Pain Pain Assessment: No/denies pain        Extremity/Trunk  Assessment         Cervical / Trunk Assessment Cervical / Trunk Assessment: Kyphotic   Communication Communication Communication: HOH   Cognition Arousal/Alertness: Lethargic;Suspect due to medications Behavior During Therapy: [facility] for tasks assessed/performed Overall Cognitive Status: Within Functional Limits for tasks assessed                     General Comments   pts daughter falling asleep mid sentence.  Shared concern with nursing as well as AD.  Safety concerns regarding daughter.            Home Living Family/patient expects to be discharged to:: Private residence Living Arrangements: Spouse/significant other Available Help at Discharge: Family Type of Home: House Home Access: Stairs to enter Technical brewer of Steps: 3 Entrance Stairs-Rails: Right Home Layout: One level     Bathroom Shower/Tub: Occupational psychologist: Handicapped height     Home Equipment: Environmental consultant - 4 wheels;Cane - single point;Shower seat;Bedside commode          Prior Functioning/Environment Level of Independence: Independent             OT Diagnosis: Generalized weakness   OT Problem List: Decreased strength;Decreased activity tolerance;Decreased safety awareness   OT Treatment/Interventions: Self-care/ADL training;DME and/or AE instruction;Patient/family education    OT Goals(Current goals can be found in the care plan section)    OT Frequency: Min 2X/week   Barriers to D/C:  End of Session Nurse Communication: Mobility status  Activity Tolerance: Patient limited by fatigue Patient left: in chair;with call bell/phone within reach;with chair alarm set;with nursing/sitter in room;with family/visitor present   Time: 1513-1540 OT Time Calculation (min): 27 min Charges:  OT General Charges $OT Visit: 1 Procedure OT Evaluation $OT Eval Low Complexity: 1 Procedure OT Treatments $Self Care/Home Management : 8-22 mins G-Codes:     Payton Mccallum D 09/08/2015, 5:08 PM

## 2015-09-08 NOTE — Telephone Encounter (Signed)
Pt daughter called in said that he is in the hospital over at [facility] and that last night the pt was extremely dehydrated and delirious and she wants to talk to someone.

## 2015-09-08 NOTE — Telephone Encounter (Signed)
i called back to pt's dtr: i hope your father feels better soon

## 2015-09-08 NOTE — Telephone Encounter (Signed)
See note below

## 2015-09-08 NOTE — Progress Notes (Signed)
Pharmacy Antibiotic Note  Kurt Elliott is a 82 y.o. male admitted on 09/05/2015 with Sepsis.  She continues on Zosyn per RX dosing. Renal function has been stable.    Plan: Continue Zosyn 3.375g IV q8h (4 hour infusion).  No further dose adjustments anticipated. Pharmacy to sign off.  Please re-consult if clinical picture changes.  Recommend change to oral antibiotic once clinically appropriate.   Height: 5' 10.5" (179.1 cm) Weight: 168 lb 10.4 oz (76.5 kg) IBW/kg (Calculated) : 74.15  Temp (24hrs), Avg:97.9 F (36.6 C), Min:97.2 F (36.2 C), Max:98.6 F (37 C)   Recent Labs Lab 09/05/15 1935 09/05/15 1956 09/05/15 2246 09/06/15 0200 09/06/15 0657 09/07/15 0554 09/08/15 1001  WBC 8.9  --   --  5.9  --  7.3 6.2  CREATININE 1.03  --   --  0.98  --  0.86 0.59*  LATICACIDVEN  --  3.31* 1.7 1.9 1.3  --   --     Estimated Creatinine Clearance: 61.8 mL/min (by C-G formula based on Cr of 0.59).    Allergies  Allergen Reactions  . Isosorbide Other (See Comments)    "severe Headache" per patient  . Lisinopril Cough  . Ticlopidine Hcl Rash    Antimicrobials this admission: 2/10 zosyn>>  2/10 vancomycin >> 2/12  Dose adjustments this admission:   Microbiology results:  BCx:   UCx:    Sputum:    MRSA PCR:  Thank you for allowing pharmacy to be a part of this patient's care.  Biagio Borg 09/08/2015 3:46 PM

## 2015-09-08 NOTE — Progress Notes (Signed)
[facility] PROGRESS NOTE  VED MACKELLAR [id number] DOB: 1921-01-03 DOA: 09/05/2015 PCP: Renato Shin, MD  Assessment/Plan: #1 systemic inflammatory response syndrome Patient presenting with nausea, vomiting and noted to have a fever. Concern for possible viral gastroenteritis. Also concern for possible pneumonia as repeat chest x-ray with possible left basilar retrocardiac capacity-question atelectasis versus pneumonia. Patient denies any chest pain or shortness of breath. Patient stated had productive cough, after nebulizer treatments. Urinalysis is unremarkable. Blood cultures are pending. Continue empiric IV Zosyn. Continue IV fluids. Discontinue IV vancomycin. Supportive care.  #2 probable pneumonia The repeat chest x-ray showing possible left basilar retrocardiac opacity-atelectasis versus pneumonia. Urine Legionella antigen pending. Urine pneumococcus antigen negative. Sputum Gram stain and culture pending. Discontinue IV vancomycin. Continue IV Zosyn, Mucinex, nebulizer treatments. Chest PT.   #3 elevated troponins May be secondary to acute infectious etiology. Patient with no chest pain. Patient has been seen in consultation by cardiology who feel no further cardiac workup is needed. 2-D echo has been canceled per cardiology.   #4 dehydration Increase IV fluids to 100 mL/h.  #5 hypertension Stable. Continue Lopressor. Increase Norvasc to 5 mg daily for better blood pressure control.  #6 chronic kidney disease stage III Stable.  #7 history of coronary artery disease Patient presented with nausea vomiting concern for possible anginal equivalent. Cardiac enzymes were cycled which were elevated. Patient denies any chest pain. 2-D echo has been counseled per cardiology. Continue aspirin and Plavix.  #8 sinus tachycardia Likely secondary to dehydration. Improved with hydration. TSH and free T4 within normal limits. Will need repeat thyroid function studies in 4-6  weeks.  #9 elevated lactic acid level Patient has been pancultured. Cultures were no growth to date. Patient IV fluids for severe dehydration. Lactic acid levels trending down. Continue IV fluids and empiric IV antibiotics.  #10 mild aortic stenosis 2-D echo canceled per cardiology. Continue low-dose Lopressor.  #11 hyperthyroidism Continue Tapazole. Continue Lopressor.  #12 prophylaxis Lovenox for DVT prophylaxis.   Code Status: Full Family Communication: Updated patient and daughter and wife at bedside. Disposition Plan: Home once acute medical issues are resolved and stable.   Consultants:  Cardiology: Dr. Rayann Heman 09/06/2015  Procedures:  Chest x-ray 09/05/2015,09/07/2015    Antibiotics:  IV Zosyn 09/06/2015  IV vancomycin 09/06/2015>>>>> 09/08/2015  HPI/Subjective: Patient noted to be agitated overnight and was given some Benadryl, Haldol, Ativan overnight and subsequently lethargic and sleepy this morning. Patient opens eyes to verbal stimuli and answers questions appropriately.  Objective: Filed Vitals:   09/07/15 2200 09/08/15 0709  BP: 166/77 189/89  Pulse:  114  Temp:  97.2 F (36.2 C)  Resp:  20    Intake/Output Summary (Last 24 hours) at 09/08/15 1242 Last data filed at 09/08/15 0645  Gross per 24 hour  Intake 2141.25 ml  Output    400 ml  Net 1741.25 ml   Filed Weights   09/05/15 2032 09/06/15 0100  Weight: 70.308 kg (155 lb) 76.5 kg (168 lb 10.4 oz)    Exam:   General:  NAD. Extremely dry mucous membranes.  Cardiovascular: RRR with 3/6 SEM  Respiratory: Clear to auscultation bilaterally. No wheezes, no crackles, no rhonchi.  Abdomen: Soft, nontender, nondistended, positive bowel sounds.  Musculoskeletal: No clubbing cyanosis or edema.   Data Reviewed: Basic Metabolic Panel:  Recent Labs Lab 09/05/15 1935 09/06/15 0200 09/07/15 0554 09/08/15 1001  NA 138 139 137 136  K 3.8 3.5 3.5 3.0*  CL 105 110 111 109  CO2 20* 20*  19* 17*  GLUCOSE 148* 122* 105* 117*  BUN 28* 25* 20 16  CREATININE 1.03 0.98 0.86 0.59*  CALCIUM 9.2 8.1* 7.8* 8.1*  MG  --  1.2* 2.2 1.8  PHOS  --  2.1*  --   --    Liver Function Tests:  Recent Labs Lab 09/05/15 1935 09/06/15 0200  AST 47* 39  ALT 24 19  ALKPHOS 91 71  BILITOT 1.9* 1.7*  PROT 7.6 6.3*  ALBUMIN 4.2 3.5    Recent Labs Lab 09/05/15 1935  LIPASE 25   No results for input(s): AMMONIA in the last 168 hours. CBC:  Recent Labs Lab 09/05/15 1935 09/06/15 0200 09/07/15 0554 09/08/15 1001  WBC 8.9 5.9 7.3 6.2  NEUTROABS 8.2*  --   --   --   HGB 12.5* 10.7* 10.3* 9.5*  HCT 34.6* 30.0* 28.6* 26.1*  MCV 93.3 95.5 92.6 92.6  PLT 161 127* 120* 121*   Cardiac Enzymes:  Recent Labs Lab 09/06/15 0200 09/06/15 0657 09/06/15 1350  TROPONINI 0.10* 0.20* 0.24*   BNP (last 3 results) No results for input(s): BNP in the last 8760 hours.  ProBNP (last 3 results) No results for input(s): PROBNP in the last 8760 hours.  CBG: No results for input(s): GLUCAP in the last 168 hours.  Recent Results (from the past 240 hour(s))  Culture, blood (Routine X 2) w Reflex to ID Panel     Status: None (Preliminary result)   Collection Time: 09/05/15  7:30 PM  Result Value Ref Range Status   Specimen Description BLOOD LEFT ANTECUBITAL  Final   Special Requests BOTTLES DRAWN AEROBIC AND ANAEROBIC 5ML  Final   Culture   Final    NO GROWTH 2 DAYS Performed at [facility]    Report Status PENDING  Incomplete  Urine culture     Status: None   Collection Time: 09/05/15  8:20 PM  Result Value Ref Range Status   Specimen Description URINE, RANDOM  Final   Special Requests NONE  Final   Culture   Final    MULTIPLE SPECIES PRESENT, SUGGEST RECOLLECTION Performed at [facility]    Report Status 09/07/2015 FINAL  Final  Culture, blood (Routine X 2) w Reflex to ID Panel     Status: None (Preliminary result)   Collection Time: 09/05/15  8:36 PM  Result  Value Ref Range Status   Specimen Description BLOOD RIGHT ANTECUBITAL  Final   Special Requests BOTTLES DRAWN AEROBIC AND ANAEROBIC 5ML  Final   Culture   Final    NO GROWTH 2 DAYS Performed at [facility]    Report Status PENDING  Incomplete  Culture, expectorated sputum-assessment     Status: None   Collection Time: 09/06/15  9:30 PM  Result Value Ref Range Status   Specimen Description SPU  Final   Special Requests NONE  Final   Sputum evaluation   Final    MICROSCOPIC FINDINGS SUGGEST THAT THIS SPECIMEN IS NOT REPRESENTATIVE OF LOWER RESPIRATORY SECRETIONS. PLEASE RECOLLECT. [facility] [id number] [place],N    Report Status 09/06/2015 FINAL  Final     Studies: Dg Chest Port 1 View  09/07/2015  CLINICAL DATA:  82 year old male with fever. History of prostate cancer. EXAM: PORTABLE CHEST 1 VIEW COMPARISON:  09/05/2015 and prior exams FINDINGS: This is a mildly low volume film. Upper limits normal heart size identified. Possible left basilar retrocardiac opacity identified -question atelectasis or airspace disease. There is no evidence of  pneumothorax or large pleural effusion. IMPRESSION: Possible left basilar retrocardiac capacity -question atelectasis or airspace disease/ pneumonia. Electronically Signed   By: Margarette Canada M.D.   On: 09/07/2015 07:17    Scheduled Meds: . amLODipine  2.5 mg Oral Daily  . arformoterol  15 mcg Nebulization BID  . aspirin EC  81 mg Oral Daily  . budesonide (PULMICORT) nebulizer solution  0.25 mg Nebulization BID  . clopidogrel  75 mg Oral Q breakfast  . colestipol  2 g Oral Daily  . enoxaparin (LOVENOX) injection  40 mg Subcutaneous Q24H  . feeding supplement  1 Container Oral TID BM  . guaiFENesin  1,200 mg Oral BID  . hyoscyamine  0.125 mg Sublingual QHS  . levalbuterol  0.63 mg Nebulization TID  . loratadine  10 mg Oral Daily  . methimazole  5 mg Oral QODAY  . methylPREDNISolone (SOLU-MEDROL) injection  60 mg Intravenous Once  .  metoprolol tartrate  12.5 mg Oral BID  . piperacillin-tazobactam (ZOSYN)  IV  3.375 g Intravenous Q8H  . potassium chloride  40 mEq Oral Once  . potassium chloride  40 mEq Oral Q4H  . sodium chloride  250 mL Intravenous Once  . sodium chloride flush  3 mL Intravenous Q12H   Continuous Infusions: . sodium chloride 75 mL/hr at 09/08/15 0606    Principal Problem:   SIRS (systemic inflammatory response syndrome) (HCC) Active Problems:   Hyperlipidemia   Essential hypertension   CAD (coronary artery disease)   Carotid artery disease (HCC)   Aortic stenosis   Sinus tachycardia (HCC)   CKD (chronic kidney disease) stage 3, GFR 30-59 ml/min   Mitral regurgitation   Dehydration   Elevated troponin   Left lower lobe pneumonia    Time spent: 84 minutes    THOMPSON,DANIEL M.D. [facility] Pager [phone]. If 7PM-7AM, please contact night-coverage at [email or web address], password [facility] 09/08/2015, 12:42 PM  LOS: 2 days

## 2015-09-09 LAB — LEGIONELLA ANTIGEN, URINE

## 2015-09-09 LAB — CBC
HCT: 27.3 % — ABNORMAL LOW (ref 39.0–52.0)
Hemoglobin: 9.7 g/dL — ABNORMAL LOW (ref 13.0–17.0)
MCH: 33.2 pg (ref 26.0–34.0)
MCHC: 35.5 g/dL (ref 30.0–36.0)
MCV: 93.5 fL (ref 78.0–100.0)
PLATELETS: 131 10*3/uL — AB (ref 150–400)
RBC: 2.92 MIL/uL — AB (ref 4.22–5.81)
RDW: 13.3 % (ref 11.5–15.5)
WBC: 5.2 10*3/uL (ref 4.0–10.5)

## 2015-09-09 LAB — BASIC METABOLIC PANEL
Anion gap: 9 (ref 5–15)
BUN: 20 mg/dL (ref 6–20)
CO2: 17 mmol/L — ABNORMAL LOW (ref 22–32)
Calcium: 8.1 mg/dL — ABNORMAL LOW (ref 8.9–10.3)
Chloride: 111 mmol/L (ref 101–111)
Creatinine, Ser: 0.74 mg/dL (ref 0.61–1.24)
Glucose, Bld: 156 mg/dL — ABNORMAL HIGH (ref 65–99)
POTASSIUM: 3.9 mmol/L (ref 3.5–5.1)
SODIUM: 137 mmol/L (ref 135–145)

## 2015-09-09 LAB — MAGNESIUM: MAGNESIUM: 2 mg/dL (ref 1.7–2.4)

## 2015-09-09 MED ORDER — TRAZODONE HCL 50 MG PO TABS
25.0000 mg | ORAL_TABLET | Freq: Every evening | ORAL | Status: DC | PRN
  Administered 2015-09-09 – 2015-09-11 (×3): 25 mg via ORAL
  Filled 2015-09-09 (×3): qty 1

## 2015-09-09 MED ORDER — METHYLPREDNISOLONE SODIUM SUCC 125 MG IJ SOLR
60.0000 mg | Freq: Once | INTRAMUSCULAR | Status: AC
  Administered 2015-09-09: 60 mg via INTRAVENOUS
  Filled 2015-09-09: qty 0.96

## 2015-09-09 NOTE — Progress Notes (Signed)
[facility] PROGRESS NOTE  Kurt Elliott [id number] DOB: Jan 03, 1921 DOA: 09/05/2015 PCP: Renato Shin, MD  Assessment/Plan: #1 systemic inflammatory response syndrome Patient presented with nausea, vomiting and noted to have a fever. Concern for possible viral gastroenteritis. Also concern for possible pneumonia as repeat chest x-ray with possible left basilar retrocardiac capacity-question atelectasis versus pneumonia. Patient denies any chest pain or shortness of breath. Patient stated had productive cough, after nebulizer treatments. Urinalysis is unremarkable. Blood cultures are pending. Continue empiric IV Zosyn, nebs. Continue IV fluids. Discontinued IV vancomycin. Supportive care. Transition to oral augmentin tomorrow.  #2 probable pneumonia The repeat chest x-ray showing possible left basilar retrocardiac opacity-atelectasis versus pneumonia. Urine Legionella antigen pending. Urine pneumococcus antigen negative. Sputum Gram stain and culture pending. Discontinued IV vancomycin. Continue IV Zosyn, Mucinex, nebulizer treatments. Chest PT. Transition to oral augmentin tomorrow.  #3 elevated troponins May be secondary to acute infectious etiology. Patient with no chest pain. Patient has been seen in consultation by cardiology who feel no further cardiac workup is needed. 2-D echo has been canceled per cardiology.   #4 dehydration Decrease IV fluids to 75cc/h  #5 hypertension Stable. Continue Lopressor and Norvasc to 5 mg daily.  #6 chronic kidney disease stage III Stable.  #7 history of coronary artery disease Patient presented with nausea vomiting concern for possible anginal equivalent. Cardiac enzymes were cycled which were elevated. Patient denies any chest pain. 2-D echo has been cancelled per cardiology. Continue aspirin and Plavix.  #8 sinus tachycardia Likely secondary to dehydration. Improved with hydration. TSH and free T4 within normal limits. Will need repeat  thyroid function studies in 4-6 weeks.  #9 elevated lactic acid level Patient has been pancultured. Cultures were no growth to date. Patient IV fluids for severe dehydration. Lactic acid levels trending down. Continue IV fluids and empiric IV antibiotics.  #10 mild aortic stenosis 2-D echo canceled per cardiology. Continue low-dose Lopressor.  #11 hyperthyroidism Continue Tapazole. Continue Lopressor.  #12 prophylaxis Lovenox for DVT prophylaxis.   Code Status: Full Family Communication: Updated patient and daughter and wife at bedside. Disposition Plan: Home vs SNF once acute medical issues are resolved and stable.   Consultants:  Cardiology: Dr. Rayann Heman 09/06/2015  Procedures:  Chest x-ray 09/05/2015,09/07/2015    Antibiotics:  IV Zosyn 09/06/2015  IV vancomycin 09/06/2015>>>>> 09/08/2015  HPI/Subjective: Patient alert and following commands. No CP. No nausea, no emesis.  Objective: Filed Vitals:   09/08/15 2205 09/09/15 0532  BP: 134/58 143/69  Pulse: 85 87  Temp: 98.2 F (36.8 C) 97.5 F (36.4 C)  Resp: 20 18    Intake/Output Summary (Last 24 hours) at 09/09/15 1035 Last data filed at 09/09/15 0100  Gross per 24 hour  Intake 1668.75 ml  Output      0 ml  Net 1668.75 ml   Filed Weights   09/05/15 2032 09/06/15 0100  Weight: 70.308 kg (155 lb) 76.5 kg (168 lb 10.4 oz)    Exam:   General:  NAD. Extremely dry mucous membranes.  Cardiovascular: RRR with 3/6 SEM  Respiratory: Clear to auscultation bilaterally. No wheezes, no crackles, no rhonchi.  Abdomen: Soft, nontender, nondistended, positive bowel sounds.  Musculoskeletal: No clubbing cyanosis or edema.   Data Reviewed: Basic Metabolic Panel:  Recent Labs Lab 09/05/15 1935 09/06/15 0200 09/07/15 0554 09/08/15 1001 09/09/15 0537  NA 138 139 137 136 137  K 3.8 3.5 3.5 3.0* 3.9  CL 105 110 111 109 111  CO2 20* 20* 19* 17* 17*  GLUCOSE 148* 122* 105* 117* 156*  BUN 28* 25* 20 16  20   CREATININE 1.03 0.98 0.86 0.59* 0.74  CALCIUM 9.2 8.1* 7.8* 8.1* 8.1*  MG  --  1.2* 2.2 1.8 2.0  PHOS  --  2.1*  --   --   --    Liver Function Tests:  Recent Labs Lab 09/05/15 1935 09/06/15 0200  AST 47* 39  ALT 24 19  ALKPHOS 91 71  BILITOT 1.9* 1.7*  PROT 7.6 6.3*  ALBUMIN 4.2 3.5    Recent Labs Lab 09/05/15 1935  LIPASE 25   No results for input(s): AMMONIA in the last 168 hours. CBC:  Recent Labs Lab 09/05/15 1935 09/06/15 0200 09/07/15 0554 09/08/15 1001 09/09/15 0537  WBC 8.9 5.9 7.3 6.2 5.2  NEUTROABS 8.2*  --   --   --   --   HGB 12.5* 10.7* 10.3* 9.5* 9.7*  HCT 34.6* 30.0* 28.6* 26.1* 27.3*  MCV 93.3 95.5 92.6 92.6 93.5  PLT 161 127* 120* 121* 131*   Cardiac Enzymes:  Recent Labs Lab 09/06/15 0200 09/06/15 0657 09/06/15 1350  TROPONINI 0.10* 0.20* 0.24*   BNP (last 3 results) No results for input(s): BNP in the last 8760 hours.  ProBNP (last 3 results) No results for input(s): PROBNP in the last 8760 hours.  CBG: No results for input(s): GLUCAP in the last 168 hours.  Recent Results (from the past 240 hour(s))  Culture, blood (Routine X 2) w Reflex to ID Panel     Status: None (Preliminary result)   Collection Time: 09/05/15  7:30 PM  Result Value Ref Range Status   Specimen Description BLOOD LEFT ANTECUBITAL  Final   Special Requests BOTTLES DRAWN AEROBIC AND ANAEROBIC 5ML  Final   Culture   Final    NO GROWTH 3 DAYS Performed at [facility]    Report Status PENDING  Incomplete  Urine culture     Status: None   Collection Time: 09/05/15  8:20 PM  Result Value Ref Range Status   Specimen Description URINE, RANDOM  Final   Special Requests NONE  Final   Culture   Final    MULTIPLE SPECIES PRESENT, SUGGEST RECOLLECTION Performed at [facility]    Report Status 09/07/2015 FINAL  Final  Culture, blood (Routine X 2) w Reflex to ID Panel     Status: None (Preliminary result)   Collection Time: 09/05/15  8:36 PM   Result Value Ref Range Status   Specimen Description BLOOD RIGHT ANTECUBITAL  Final   Special Requests BOTTLES DRAWN AEROBIC AND ANAEROBIC 5ML  Final   Culture   Final    NO GROWTH 3 DAYS Performed at [facility]    Report Status PENDING  Incomplete  Culture, expectorated sputum-assessment     Status: None   Collection Time: 09/06/15  9:30 PM  Result Value Ref Range Status   Specimen Description SPU  Final   Special Requests NONE  Final   Sputum evaluation   Final    MICROSCOPIC FINDINGS SUGGEST THAT THIS SPECIMEN IS NOT REPRESENTATIVE OF LOWER RESPIRATORY SECRETIONS. PLEASE RECOLLECT. [facility] [id number] [place],N    Report Status 09/06/2015 FINAL  Final     Studies: No results found.  Scheduled Meds: . amLODipine  2.5 mg Oral Once  . amLODipine  5 mg Oral Daily  . arformoterol  15 mcg Nebulization BID  . aspirin EC  81 mg Oral Daily  . budesonide (PULMICORT) nebulizer solution  0.25 mg Nebulization BID  . clopidogrel  75 mg Oral Q breakfast  . colestipol  2 g Oral Daily  . enoxaparin (LOVENOX) injection  40 mg Subcutaneous Q24H  . feeding supplement  1 Container Oral TID BM  . guaiFENesin  1,200 mg Oral BID  . hyoscyamine  0.125 mg Sublingual QHS  . levalbuterol  0.63 mg Nebulization TID  . loratadine  10 mg Oral Daily  . LORazepam  0.5 mg Intravenous Once  . methimazole  5 mg Oral QODAY  . metoprolol tartrate  12.5 mg Oral BID  . piperacillin-tazobactam (ZOSYN)  IV  3.375 g Intravenous Q8H  . potassium chloride  40 mEq Oral Once  . sodium chloride flush  3 mL Intravenous Q12H   Continuous Infusions: . sodium chloride 100 mL/hr at 09/09/15 1004    Principal Problem:   SIRS (systemic inflammatory response syndrome) (HCC) Active Problems:   Hyperlipidemia   Essential hypertension   CAD (coronary artery disease)   Carotid artery disease (HCC)   Aortic stenosis   Sinus tachycardia (HCC)   CKD (chronic kidney disease) stage 3, GFR 30-59 ml/min    Mitral regurgitation   Dehydration   Elevated troponin   Left lower lobe pneumonia    Time spent: 40 minutes    THOMPSON,DANIEL M.D. [facility] Pager [phone]. If 7PM-7AM, please contact night-coverage at [email or web address], password [facility] 09/09/2015, 10:35 AM  LOS: 3 days

## 2015-09-09 NOTE — Consult Note (Signed)
CSW received inappropriate consult for SNF placement. PT recommending HH. RNCM aware and following.  BSW Intern met with pt, pt wife, and pt son at bedside. BSW Intern introduced self and explained role. Pt declining SNF at this time. Pt son stated pt had just completed [facility] PT a few days before being hospitalized and was doing well. Pt son reports pt would not stay overnight at a facility and that they would like [facility] PT to come back to the home.  No further SW needs identified at this time.  BSW Intern signing off, Harlon Flor, [facility] Work Department  [phone]

## 2015-09-09 NOTE — Care Management Note (Signed)
Case Management Note  Patient Details  Name: Kurt Elliott MRN: [id number] Date of Birth: Mar 21, 1921  Subjective/Objective: PT-recc HHPT. [facility] chosen for HHPT. [facility] HHPT,f8f order. [facility] rep Santiago Glad aware & following.                  Action/Plan:d/c home w/HHC.   Expected Discharge Date:   (unknown)               Expected Discharge Plan:  [place]  In-House Referral:     Discharge planning Services  CM Consult  Post Acute Care Choice:    Choice offered to:  Patient  DME Arranged:    DME Agency:     HH Arranged:    [place] Agency:  [place]  Status of Service:  In process, will continue to follow  Medicare Important Message Given:  Yes Date Medicare IM Given:    Medicare IM give by:    Date Additional Medicare IM Given:    Additional Medicare Important Message give by:     If discussed at [place] of Stay Meetings, dates discussed:    Additional Comments:  Dessa Phi, RN 09/09/2015, 12:57 PM

## 2015-09-09 NOTE — Care Management Important Message (Signed)
Important Message  Patient Details  Name: Kurt Elliott MRN: [id number] Date of Birth: [birth year]   Medicare Important Message Given:  Yes    Camillo Flaming 09/09/2015, 12:41 [place] Message  Patient Details  Name: Kurt Elliott MRN: [id number] Date of Birth: 04/14/1921   Medicare Important Message Given:  Yes    Camillo Flaming 09/09/2015, 12:41 PM

## 2015-09-09 NOTE — Progress Notes (Signed)
Physical Therapy Treatment Patient Details Name: Kurt Elliott MRN: [id number] DOB: 06/03/1921 Today's Date: 09/09/2015    History of Present Illness 82 yo male admitted with SIRS. Hx of HTN, vertigo, unsteady gait, aortic stenosis, spinal stenosis, CKD, CAD, arthritis    PT Comments    Pt progressing well with mobility, he ambulated 250' with RW, SaO2 95% on RA, no loss of balance, 2/4 dyspnea.   Follow Up Recommendations  Home health PT;Supervision/Assistance - 24 hour     Equipment Recommendations  None recommended by PT    Recommendations for Other Services       Precautions / Restrictions Precautions Precautions: Fall Restrictions Weight Bearing Restrictions: No    Mobility  Bed Mobility Overal bed mobility: Modified Independent Bed Mobility: Sit to Supine       Sit to supine: Modified independent (Device/Increase time)   General bed mobility comments: HOB up 30*  Transfers Overall transfer level: Needs assistance Equipment used: Rolling walker (2 wheeled) Transfers: Sit to/from Stand Sit to Stand: Min guard         General transfer comment:  Vcs hand placement  Ambulation/Gait Ambulation/Gait assistance: Min guard Ambulation Distance (Feet): 250 Feet Assistive device: Rolling walker (2 wheeled) Gait Pattern/deviations: Decreased step length - right;Decreased step length - left   Gait velocity interpretation: at or above normal speed for age/gender General Gait Details: SaO2 95% on RA walking, 2/4 dyspnea, no LOB, verbal cues for positioning in RW and to increase step length   Stairs            Wheelchair Mobility    Modified Rankin (Stroke Patients Only)       Balance     Sitting balance-Leahy Scale: Good       Standing balance-Leahy Scale: Fair                      Cognition Arousal/Alertness: Awake/alert Behavior During Therapy: WFL for tasks assessed/performed Overall Cognitive Status: Within Functional Limits  for tasks assessed                      Exercises      General Comments        Pertinent Vitals/Pain Pain Assessment: No/denies pain    Home Living                      Prior Function            PT Goals (current goals can now be found in the care plan section) Acute Rehab PT Goals Patient Stated Goal: home soon PT Goal Formulation: With patient/family Time For Goal Achievement: 09/21/15 Potential to Achieve Goals: Good Progress towards PT goals: Progressing toward goals    Frequency  Min 3X/week    PT Plan Current plan remains appropriate    Co-evaluation             End of Session Equipment Utilized During Treatment: Gait belt Activity Tolerance: Patient tolerated treatment well Patient left: in bed;with call bell/phone within reach;with family/visitor present     Time: 1205-1228 PT Time Calculation (min) (ACUTE ONLY): 23 min  Charges:  $Gait Training: 8-22 mins $Therapeutic Activity: 8-22 mins                    G Codes:      Philomena Doheny 09/09/2015, 12:34 PM [phone]

## 2015-09-10 ENCOUNTER — Inpatient Hospital Stay (HOSPITAL_COMMUNITY): Payer: Medicare Other

## 2015-09-10 DIAGNOSIS — R531 Weakness: Secondary | ICD-10-CM

## 2015-09-10 DIAGNOSIS — N183 Chronic kidney disease, stage 3 (moderate): Secondary | ICD-10-CM

## 2015-09-10 DIAGNOSIS — R05 Cough: Secondary | ICD-10-CM

## 2015-09-10 DIAGNOSIS — R5383 Other fatigue: Secondary | ICD-10-CM

## 2015-09-10 DIAGNOSIS — R6510 Systemic inflammatory response syndrome (SIRS) of non-infectious origin without acute organ dysfunction: Secondary | ICD-10-CM

## 2015-09-10 LAB — BASIC METABOLIC PANEL
Anion gap: 9 (ref 5–15)
BUN: 22 mg/dL — AB (ref 6–20)
CALCIUM: 8.7 mg/dL — AB (ref 8.9–10.3)
CO2: 19 mmol/L — ABNORMAL LOW (ref 22–32)
CREATININE: 0.71 mg/dL (ref 0.61–1.24)
Chloride: 111 mmol/L (ref 101–111)
GFR calc Af Amer: >60 mL/min (ref >60)
GLUCOSE: 133 mg/dL — AB (ref 65–99)
POTASSIUM: 3.9 mmol/L (ref 3.5–5.1)
SODIUM: 139 mmol/L (ref 135–145)

## 2015-09-10 LAB — CULTURE, BLOOD (ROUTINE X 2)
CULTURE: NO GROWTH
Culture: NO GROWTH

## 2015-09-10 LAB — CBC
HCT: 29 % — ABNORMAL LOW (ref 39.0–52.0)
Hemoglobin: 10.2 g/dL — ABNORMAL LOW (ref 13.0–17.0)
MCH: 33.1 pg (ref 26.0–34.0)
MCHC: 35.2 g/dL (ref 30.0–36.0)
MCV: 94.2 fL (ref 78.0–100.0)
PLATELETS: 161 10*3/uL (ref 150–400)
RBC: 3.08 MIL/uL — AB (ref 4.22–5.81)
RDW: 13.6 % (ref 11.5–15.5)
WBC: 8.1 10*3/uL (ref 4.0–10.5)

## 2015-09-10 MED ORDER — AMLODIPINE BESYLATE 5 MG PO TABS: 5.0000 mg | ORAL_TABLET | Freq: Every day | ORAL | Status: DC

## 2015-09-10 MED ORDER — LEVALBUTEROL HCL 0.63 MG/3ML IN NEBU
0.6300 mg | INHALATION_SOLUTION | Freq: Four times a day (QID) | RESPIRATORY_TRACT | Status: DC | PRN
  Administered 2015-09-10 – 2015-09-11 (×4): 0.63 mg via RESPIRATORY_TRACT
  Filled 2015-09-10 (×4): qty 3

## 2015-09-10 NOTE — Progress Notes (Signed)
Occupational Therapy Treatment Patient Details Name: Kurt Elliott MRN: [id number] DOB: [birth year] Today's Date: 09/10/2015    History of present illness 82 yo male admitted with SIRS. Hx of HTN, vertigo, unsteady gait, aortic stenosis, spinal stenosis, CKD, CAD, arthritis   OT comments  Pt much improved this day  Follow Up Recommendations  Home health OT    Equipment Recommendations  None recommended by OT    Recommendations for Other Services      Precautions / Restrictions Precautions Precautions: Fall Restrictions Weight Bearing Restrictions: No       Mobility Bed Mobility               General bed mobility comments: pt in chair  Transfers Overall transfer level: Needs assistance Equipment used: Rolling walker (2 wheeled) Transfers: Sit to/from [facility] Sit to Stand: Min assist Stand pivot transfers: Min assist       General transfer comment:  Vcs hand placement    Balance                                   ADL Overall ADL's : Needs assistance/impaired Eating/Feeding: Set up;Sitting   Grooming: Wash/dry face;Sitting   Upper Body Bathing: Minimal assitance;Sitting   Lower Body Bathing: Moderate assistance;Sit to/from stand;Cueing for safety;Cueing for sequencing   Upper Body Dressing : Set up;Sitting       Toilet Transfer: Minimal assistance;BSC   Toileting- Clothing Manipulation and Hygiene: Sit to/from stand;Cueing for sequencing;Cueing for safety;Minimal assistance Toileting - Clothing Manipulation Details (indicate cue type and reason): sit to stand with urinal       General ADL Comments: much improved this day.                 Cognition   Behavior During Therapy: WFL for tasks assessed/performed Overall Cognitive Status: Within Functional Limits for tasks assessed                                            Frequency Min 2X/week     Progress Toward Goals  OT  Goals(current goals can now be found in the care plan section)  Progress towards OT goals: Progressing toward goals     Plan Discharge plan needs to be updated       End of Session Equipment Utilized During Treatment: Rolling walker   Activity Tolerance Patient tolerated treatment well   Patient Left in chair;with call bell/phone within reach;with chair alarm set;with family/visitor present (family member asleep)   Nurse Communication Mobility status        Time: 0935-1006 OT Time Calculation (min): 31 min  Charges: OT General Charges $OT Visit: 1 Procedure OT Treatments $Self Care/Home Management : 23-37 mins  Sekai Gitlin, Thereasa Parkin 09/10/2015, 10:18 AM

## 2015-09-10 NOTE — Progress Notes (Signed)
Patient ID: Kurt Elliott, male   DOB: 08-25-1920, 82 y.o.   MRN: [id number]  [facility] PROGRESS NOTE  Kurt Elliott [id number] DOB: Nov 27, 1920 DOA: 09/05/2015 PCP: Renato Shin, MD   Brief narrative:    82 y/o male with PMH of HTN, adenocarcinoma of prostate (on Lupron, followed by Dr. Jana Hakim), hyperthyroidism, GERD, CKD III, CAD who presented to [facility] on 2/10 with nausea and vomiting x 24 hours and fever.   Upon arrival to the ED patient was afebrile, tachycardic and initial CXR was negative. Initial labwork revealed normal WBC 5.9, lactate 1.9, minimally elevated troponin at 0.10. Initially this was thought to be a viral gastroenteritis however due to a rise in temperature to 99.6 and tachycardia patient was started on empiric Vanc and Zosyn and admitted to the hospitalist service for further work-up.   On 2/12 repeat CXR revealed possible left basilar retrocardiac opacity (atx vs pna). Patient was placed on mucinex and nebulizer treatments at that time, although patient denies SOB. Abx narrowed to Zosyn and PCCM was consulted 2/14 PM for further evaluation.   Assessment/Plan:    #1 systemic inflammatory response syndrome - ? viral gastroenteritis vs LLL pneumonia  - appreciate PCCM team following, continue BD's as needed  - ABX discontinued per PCCM   #2 probable pneumonia - stop ABX as noted above - monitor clinical response and wean off of oxygen   #3 elevated troponins - secondary to acute infectious etiology - no chest pain   #4 thrombocytopenia - reactive, resolved   #5 hypertension - continue Lopressor and Norvasc to 5 mg daily.  #6 chronic kidney disease stage III - stable   #7 history of coronary artery disease - asymptomatic   #8 sinus tachycardia - rate at target range   #9 elevated lactic acid level - stabilized   #10 mild aortic stenosis - 2-D echo canceled per cardiology. Continue low-dose Lopressor.  #11 hyperthyroidism - Continue  Tapazole. Continue Lopressor.  #12 prophylaxis Lovenox for DVT prophylaxis.  Code Status: Full.  Family Communication:  plan of care discussed with the patient Disposition Plan: Home by 2/16   IV access:  Peripheral IV  Procedures and diagnostic studies:    Dg Chest 2 View 09/05/2015  Left lung base minimal atelectatic changes. Pneumonia is less likely.  Dg Chest Port 1 View 09/10/2015  Overall mild improvement in the appearance of the pulmonary interstitium. Left lower lobe atelectasis or pneumonia has improved. Mild interval improvement in pulmonary interstitial edema.   Dg Chest Port 1 View 09/07/2015  Possible left basilar retrocardiac capacity -question atelectasis or airspace disease/ pneumonia.  Medical Consultants:  PCCM  Other Consultants:  PT  Faye Ramsay, MD  [facility] Pager [phone]  If 7PM-7AM, please contact night-coverage [email or web address] Password [email or web address] 09/10/2015, 12:03 PM   LOS: 4 days   HPI/Subjective: No events overnight.   Objective: Filed Vitals:   09/09/15 1954 09/09/15 2141 09/10/15 0526 09/10/15 0850  BP:  183/77    Pulse:  103    Temp:  97.4 F (36.3 C)    TempSrc:  Oral    Resp:  20    Height:      Weight:      SpO2: 96% 96% 98% 99%    Intake/Output Summary (Last 24 hours) at 09/10/15 1203 Last data filed at 09/10/15 0927  Gross per 24 hour  Intake 2254.58 ml  Output    125 ml  Net 2129.58 ml    Exam:   General:  Pt is alert, follows commands appropriately, not in acute distress  Cardiovascular: Irregular rate and rhythm, no rubs, no gallops  Respiratory: Clear to auscultation bilaterally, diminished breath sounds at bases   Abdomen: Soft, non tender, non distended, bowel sounds present, no guarding  Extremities: No edema, pulses DP and PT palpable bilaterally   Data Reviewed: Basic Metabolic Panel:  Recent Labs Lab 09/06/15 0200 09/07/15 0554 09/08/15 1001 09/09/15 0537 09/10/15 0515  NA 139 137 136 137 139   K 3.5 3.5 3.0* 3.9 3.9  CL 110 111 109 111 111  CO2 20* 19* 17* 17* 19*  GLUCOSE 122* 105* 117* 156* 133*  BUN 25* 20 16 20  22*  CREATININE 0.98 0.86 0.59* 0.74 0.71  CALCIUM 8.1* 7.8* 8.1* 8.1* 8.7*  MG 1.2* 2.2 1.8 2.0  --   PHOS 2.1*  --   --   --   --    Liver Function Tests:  Recent Labs Lab 09/05/15 1935 09/06/15 0200  AST 47* 39  ALT 24 19  ALKPHOS 91 71  BILITOT 1.9* 1.7*  PROT 7.6 6.3*  ALBUMIN 4.2 3.5    Recent Labs Lab 09/05/15 1935  LIPASE 25   CBC:  Recent Labs Lab 09/05/15 1935 09/06/15 0200 09/07/15 0554 09/08/15 1001 09/09/15 0537 09/10/15 0515  WBC 8.9 5.9 7.3 6.2 5.2 8.1  NEUTROABS 8.2*  --   --   --   --   --   HGB 12.5* 10.7* 10.3* 9.5* 9.7* 10.2*  HCT 34.6* 30.0* 28.6* 26.1* 27.3* 29.0*  MCV 93.3 95.5 92.6 92.6 93.5 94.2  PLT 161 127* 120* 121* 131* 161   Cardiac Enzymes:  Recent Labs Lab 09/06/15 0200 09/06/15 0657 09/06/15 1350  TROPONINI 0.10* 0.20* 0.24*   BNP: Invalid input(s): POCBNP CBG: No results for input(s): GLUCAP in the last 168 hours.  Recent Results (from the past 240 hour(s))  Culture, blood (Routine X 2) w Reflex to ID Panel     Status: None (Preliminary result)   Collection Time: 09/05/15  7:30 PM  Result Value Ref Range Status   Specimen Description BLOOD LEFT ANTECUBITAL  Final   Special Requests BOTTLES DRAWN AEROBIC AND ANAEROBIC 5ML  Final   Culture   Final    NO GROWTH 4 DAYS Performed at [facility]    Report Status PENDING  Incomplete  Urine culture     Status: None   Collection Time: 09/05/15  8:20 PM  Result Value Ref Range Status   Specimen Description URINE, RANDOM  Final   Special Requests NONE  Final   Culture   Final    MULTIPLE SPECIES PRESENT, SUGGEST RECOLLECTION Performed at [facility]    Report Status 09/07/2015 FINAL  Final  Culture, blood (Routine X 2) w Reflex to ID Panel     Status: None (Preliminary result)   Collection Time: 09/05/15  8:36 PM  Result  Value Ref Range Status   Specimen Description BLOOD RIGHT ANTECUBITAL  Final   Special Requests BOTTLES DRAWN AEROBIC AND ANAEROBIC 5ML  Final   Culture   Final    NO GROWTH 4 DAYS Performed at [facility]    Report Status PENDING  Incomplete  Culture, expectorated sputum-assessment     Status: None   Collection Time: 09/06/15  9:30 PM  Result Value Ref Range Status   Specimen Description SPU  Final   Special Requests NONE  Final   Sputum evaluation   Final  MICROSCOPIC FINDINGS SUGGEST THAT THIS SPECIMEN IS NOT REPRESENTATIVE OF LOWER RESPIRATORY SECRETIONS. PLEASE RECOLLECT. [facility] [id number] [place],N    Report Status 09/06/2015 FINAL  Final     Scheduled Meds: . amLODipine  2.5 mg Oral Once  . amLODipine  5 mg Oral Daily  . aspirin EC  81 mg Oral Daily  . clopidogrel  75 mg Oral Q breakfast  . colestipol  2 g Oral Daily  . enoxaparin (LOVENOX) injection  40 mg Subcutaneous Q24H  . feeding supplement  1 Container Oral TID BM  . guaiFENesin  1,200 mg Oral BID  . hyoscyamine  0.125 mg Sublingual QHS  . loratadine  10 mg Oral Daily  . LORazepam  0.5 mg Intravenous Once  . methimazole  5 mg Oral QODAY  . metoprolol tartrate  12.5 mg Oral BID  . potassium chloride  40 mEq Oral Once  . sodium chloride flush  3 mL Intravenous Q12H   Continuous Infusions:

## 2015-09-10 NOTE — Consult Note (Signed)
Name: Kurt Elliott MRN: [id number] DOB: [birth year]    ADMISSION DATE:  09/05/2015 CONSULTATION DATE:  09/10/15  REFERRING MD :  Dr. Grandville Silos   CHIEF COMPLAINT:  Nausea / Vomiting.  Concern for PNA    HISTORY OF PRESENT ILLNESS:  82 y/o male with PMH of HTN, adenocarcinoma of prostate (on Lupron, followed by Dr. Jana Hakim), hyperthyroidism, GERD, CKD III, CAD who presented to [facility] on 2/10 with nausea and vomiting x 24 hours and fever.   Upon arrival to the ED patient was afebrile, tachycardic and initial CXR was negative. Initial labwork revealed normal WBC 5.9, lactate 1.9, minimally elevated troponin at 0.10. Initially this was thought to be a viral gastroenteritis however due to a rise in temperature to 99.6 and tachycardia patient was started on empiric Vanc and Zosyn and admitted to the hospitalist service for further work-up.   On 2/12 repeat CXR revealed possible left basilar retrocardiac opacity (atx vs pna). Patient was placed on mucinex and nebulizer treatments at that time, although patient denies SOB. Abx narrowed to Zosyn and PCCM was consulted 2/14 PM for further evaluation.   Currently, the patient reports he initially presented for an acute episode of nausea and vomiting that he attributes to happening after eating soup.  The patient reported relief / feeling some better after vomiting.  Currently, he denies fevers, chills, nausea, vomiting, diarrhea, shortness of breath, chest pain, pain with inspiration.  He does endorse occasional wheezing, post nasal drip and feeling "weak in the legs".  Reports occasional cough with "transparent" sputum production.   PAST MEDICAL HISTORY :   has a past medical history of Hypertension; Hyperlipemia; Unspecified disorder of liver; Cellulitis, leg; Pulmonary nodule, right; Adenocarcinoma of prostate ([place]); Esophageal stricture; Spinal stenosis; Hyperglycemia; Hyperthyroidism; GERD (gastroesophageal reflux disease); colonic polyps; Renal  artery stenosis ([place]); Atheroma; Inferior mesenteric artery injury; Vertigo; Diverticulum; Incisional hernia; Rash; Cough; Carotid artery disease ([place]); Renal cyst; CAD (coronary artery disease), native coronary artery; and Mild aortic stenosis.  has past surgical history that includes Esophagogastroduodenoscopy; Cardiac catheterization; Incisional hernia repair; left heart catheterization with coronary angiogram (N/A, 10/21/2014); percutaneous coronary stent intervention (pci-s) (10/21/2014); Sigmoid resection / rectopexy; and Cardiac catheterization (N/A, 02/18/2015).   Prior to Admission medications   Medication Sig Start Date End Date Taking? Authorizing Provider  aspirin EC 81 MG tablet Take 81 mg by mouth daily.   Yes Historical Provider, MD  clopidogrel (PLAVIX) 75 MG tablet Take 1 tablet (75 mg total) by mouth daily with breakfast. 10/22/14  Yes Almyra Deforest, PA  colestipol (COLESTID) 1 G tablet Take 2 g by mouth daily.    Yes Historical Provider, MD  hyoscyamine (LEVSIN) 0.125 MG tablet Take 1 tablet (0.125 mg total) by mouth at bedtime. 07/01/15  Yes Renato Shin, MD  metoprolol tartrate (LOPRESSOR) 25 MG tablet Take 0.5 tablets (12.5 mg total) by mouth 2 (two) times daily. 08/18/15  Yes Carlena Bjornstad, MD  pantoprazole (PROTONIX) 40 MG tablet TAKE 1 TABLET (40 MG TOTAL) BY MOUTH DAILY. 07/31/15  Yes Renato Shin, MD  Leuprolide Acetate (LUPRON DEPOT IM) Inject into the muscle as directed. Done at [place] office approx once every 3 months    Historical Provider, MD  methimazole (TAPAZOLE) 5 MG tablet TAKE 1 TABLET BY MOUTH 3 TIMES A WEEK (MON, WED, AND FRI) 09/01/15   Renato Shin, MD  nitroGLYCERIN (NITROSTAT) 0.4 MG SL tablet Place 0.4 mg under the tongue every 5 (five) minutes as needed for chest pain.  Historical Provider, MD   Allergies  Allergen Reactions  . Isosorbide Other (See Comments)    "severe Headache" per patient  . Lisinopril Cough  . Ticlopidine Hcl Rash    FAMILY HISTORY:    family history includes Breast cancer in his sister; Heart attack in his father; Hypertension in his brother, father, mother, and sister; Pemphigus vulgaris in his mother; Stroke in his father.   SOCIAL HISTORY:  reports that he has never smoked. He has never used smokeless tobacco. He reports that he does not drink alcohol or use illicit drugs.  REVIEW OF SYSTEMS:   Constitutional: Negative for fever, chills, weight loss, malaise/fatigue and diaphoresis.  Reports weakness  HENT: Negative for hearing loss, ear pain, nosebleeds, congestion, sore throat, neck pain, tinnitus and ear discharge.   Eyes: Negative for blurred vision, double vision, photophobia, pain, discharge and redness.  Respiratory: Negative for hemoptysis, sputum production, shortness of breath, wheezing and stridor.  Occasional cough with clear sputum production Cardiovascular: Negative for chest pain, palpitations, orthopnea, claudication, leg swelling and PND.  Gastrointestinal: Negative for heartburn, nausea, vomiting, abdominal pain, diarrhea, constipation, blood in stool and melena.  Genitourinary: Negative for dysuria, urgency, frequency, hematuria and flank pain.  Musculoskeletal: Negative for myalgias, back pain, joint pain and falls.  Skin: Negative for itching and rash.  Neurological: Negative for dizziness, tingling, tremors, sensory change, speech change, focal weakness, seizures, loss of consciousness, weakness and headaches.  Endo/Heme/Allergies: Negative for environmental allergies and polydipsia. Does not bruise/bleed easily.  SUBJECTIVE:   VITAL SIGNS: Temp:  [97.4 F (36.3 C)-97.6 F (36.4 C)] 97.4 F (36.3 C) (02/14 2141) Pulse Rate:  [83-103] 103 (02/14 2141) Resp:  [18-20] 20 (02/14 2141) BP: (154-183)/(62-77) 183/77 mmHg (02/14 2141) SpO2:  [96 %-99 %] 99 % (02/15 0850)  PHYSICAL EXAMINATION: General:  Well developed elderly male up in chair, NAD Neuro:  AAOx4, speech clear, MAE, normal  strength HEENT:  MM pink/moist, good dentition, no appreciable JVD Cardiovascular:  s1s2 rrr, no m/r/g Lungs:  Even/non-labored, lungs bilaterally clear Abdomen:  NTND, bsx4 active  Musculoskeletal:  No acute deformities  Skin:  Warm/dry, no edema  PSY:  Normal mood / pleasant   Recent Labs Lab 09/08/15 1001 09/09/15 0537 09/10/15 0515  NA 136 137 139  K 3.0* 3.9 3.9  CL 109 111 111  CO2 17* 17* 19*  BUN 16 20 22*  CREATININE 0.59* 0.74 0.71  GLUCOSE 117* 156* 133*    Recent Labs Lab 09/08/15 1001 09/09/15 0537 09/10/15 0515  HGB 9.5* 9.7* 10.2*  HCT 26.1* 27.3* 29.0*  WBC 6.2 5.2 8.1  PLT 121* 131* 161   Dg Chest Port 1 View  09/10/2015  CLINICAL DATA:  Cough, shortness of breath, coronary artery disease, valvular heart disease, left lower lobe pneumonia. EXAM: PORTABLE CHEST 1 VIEW COMPARISON:  Portable chest x-ray of September 07, 2015 FINDINGS: The lungs are adequately inflated. The interstitial markings are less prominent today. The retrocardiac region on the left remains dense but a portion of the hemidiaphragm is now visible. The cardiac silhouette remains enlarged. The central pulmonary vascularity is prominent. Mild perihilar subsegmental atelectasis on the right persists. IMPRESSION: Overall mild improvement in the appearance of the pulmonary interstitium. Left lower lobe atelectasis or pneumonia has improved. Mild interval improvement in pulmonary interstitial edema. Electronically Signed   By: David  Martinique M.D.   On: 09/10/2015 07:25    SIGNIFICANT EVENTS  2/10  Admit with complaints of N/V 2/12  Concern for CXR  with questionable left retrocardiac infiltrate  STUDIES:     ASSESSMENT / PLAN:  LLL Retrocardiac Opacity - Atelectasis vs PNA, CXR with small amount LL retrocardiac airspace disease that is unchanged from admission, doubt infectious given no leukocytosis, afebrile, initial procalcitonin negative.  Suspect possible gastroenteritis as initial acute  process.    Plan:  Encourage pulmonary hygiene - IS, mobilize  Discontinue antibiotics  Monitor fever curve / WBC Continue Mucinex  Adjust bronchodilators to PRN (non-smoker, remote brief use 70 years ago) Wean O2 to off PT efforts   Anemia - ? If contributing to weakness   Plan: Per primary SVC No indication for transfusion at this time    Noe Gens, NP-C [facility] Pgr: [phone] or if no answer [phone] 09/10/2015, 11:03 AM   PCCM Attending Note: Patient seen and examined with nurse practitioner. Please refer to her consult note which I have reviewed in detail. I personally reviewed the patient's x-ray imaging which does not show a definitive focal opacity. X-ray is essentially unchanged compared with previous from earlier this admission. Presented with nausea, vomiting, and diarrhea acute in onset. Patient does admit to sinus congestion with some drainage. At this time I'm not sure there is a clear benefit to continuing bronchodilator therapies or antibiotics. He does have some element of fatigue and weakness which could be secondary to his underlying anemia.Recommend discontinuing bronchodilator therapy and antibiotics at this time. Recommend continued ambulation with pulmonary toilette with incentive spirometer. Remainder of care per primary service.   Sonia Baller Ashok Cordia, M.D. [facility] Pager:  [phone] After 3pm or if no response, call [phone]

## 2015-09-11 DIAGNOSIS — R509 Fever, unspecified: Secondary | ICD-10-CM | POA: Insufficient documentation

## 2015-09-11 LAB — BASIC METABOLIC PANEL
ANION GAP: 8 (ref 5–15)
BUN: 20 mg/dL (ref 6–20)
CALCIUM: 8.7 mg/dL — AB (ref 8.9–10.3)
CO2: 20 mmol/L — ABNORMAL LOW (ref 22–32)
Chloride: 107 mmol/L (ref 101–111)
Creatinine, Ser: 0.62 mg/dL (ref 0.61–1.24)
GFR calc non Af Amer: >60 mL/min (ref >60)
GLUCOSE: 96 mg/dL (ref 65–99)
POTASSIUM: 3.7 mmol/L (ref 3.5–5.1)
SODIUM: 135 mmol/L (ref 135–145)

## 2015-09-11 LAB — CBC
HCT: 28.9 % — ABNORMAL LOW (ref 39.0–52.0)
Hemoglobin: 10.2 g/dL — ABNORMAL LOW (ref 13.0–17.0)
MCH: 33.2 pg (ref 26.0–34.0)
MCHC: 35.3 g/dL (ref 30.0–36.0)
MCV: 94.1 fL (ref 78.0–100.0)
PLATELETS: 172 10*3/uL (ref 150–400)
RBC: 3.07 MIL/uL — AB (ref 4.22–5.81)
RDW: 13.9 % (ref 11.5–15.5)
WBC: 8.7 10*3/uL (ref 4.0–10.5)

## 2015-09-11 MED ORDER — GUAIFENESIN-DM 100-10 MG/5ML PO SYRP
5.0000 mL | ORAL_SOLUTION | ORAL | Status: DC | PRN
  Administered 2015-09-11 – 2015-09-12 (×3): 5 mL via ORAL
  Filled 2015-09-11 (×2): qty 10

## 2015-09-11 MED ORDER — LEVALBUTEROL HCL 1.25 MG/0.5ML IN NEBU
1.2500 mg | INHALATION_SOLUTION | RESPIRATORY_TRACT | Status: DC
Start: 2015-09-11 — End: 2015-09-12
  Administered 2015-09-11 – 2015-09-12 (×5): 1.25 mg via RESPIRATORY_TRACT
  Filled 2015-09-11 (×11): qty 0.5

## 2015-09-11 MED ORDER — FLUTICASONE PROPIONATE 50 MCG/ACT NA SUSP
2.0000 | Freq: Every day | NASAL | Status: DC
  Administered 2015-09-12: 2 via NASAL
  Filled 2015-09-11: qty 16

## 2015-09-11 NOTE — Progress Notes (Signed)
Nutrition Follow-up  DOCUMENTATION CODES:   Not applicable  INTERVENTION:  - Continue Boost Breeze TID - Will reorder snacks TID  - RD will continue to monitor for needs  NUTRITION DIAGNOSIS:   Inadequate oral intake related to other (see comment) (advanced age) as evidenced by per patient/family report. -ongoing  GOAL:   Patient will meet greater than or equal to 90% of their needs -unmet on average  MONITOR:   PO intake, Supplement acceptance, Labs, Weight trends, I & O's  ASSESSMENT:   82 y.o. male with a h/o known CAD, HTN, and mild aortic stenosis. He is admitted with acute febrile medical illness. He was felt to be dehydrated and was noted to have nausea and vomiting. He was admitted for IV hydration.  2/16 Pt continues with 25-50% intakes. Pt reports that for breakfast this AM he had Cheerios and oatmeal which is a typical breakfast for him. He denies any abdominal discomfort or nausea during meal times. Pt needs softer foods; no raw fruits and vegetables. RD had previously ordered snacks for pt but he and family, who is at bedside, state that pt has not been receiving these items. RD to reorder based on snack preferences provided by pt.   Not meeting needs. Medications reviewed. Labs reviewed; Ca: 8.7 mg/dL.   2/12 - Patient in room with daughter at bedside.  - Per pt, he usually eats small frequent meals a day.  - PO intakes: 25-50%.  - Pt states he cannot eat much at one time.  - He would like snacks in between meals, RD to order.  - Pt would like to continue to receive Boost Breeze.  - Per pt's daughter, his main issue is drinking enough fluids.  - She denies pt has had any appetite changes.  - Per weight history, pt's weight is +13 lb since admission.  - Pt with mild muscle depletion.   Diet Order:  Diet regular Room service appropriate?: Yes; Fluid consistency:: Thin  Skin:  Reviewed, no issues  Last BM:  2/15  Height:   Ht Readings from Last 1  Encounters:  09/06/15 5' 10.5" (1.791 m)    Weight:   Wt Readings from Last 1 Encounters:  09/06/15 168 lb 10.4 oz (76.5 kg)    Ideal Body Weight:  78.2 kg  BMI:  Body mass index is 23.85 kg/(m^2).  Estimated Nutritional Needs:   Kcal:  1800-2000  Protein:  85-95g  Fluid:  1.8-2L/day  EDUCATION NEEDS:   No education needs identified at this time     Jarome Matin, RD, LDN Inpatient Clinical Dietitian Pager # [phone] After hours/weekend pager # [phone]

## 2015-09-11 NOTE — Progress Notes (Addendum)
Patient ID: Kurt Elliott, male   DOB: 1920/11/13, 82 y.o.   MRN: [id number]  [facility] PROGRESS NOTE  Kurt Elliott [id number] DOB: [birth year] DOA: 09/05/2015 PCP: Renato Shin, MD   Brief narrative:    82 y/o male with PMH of HTN, adenocarcinoma of prostate (on Lupron, followed by Dr. Jana Hakim), hyperthyroidism, GERD, CKD III, CAD who presented to [facility] on 2/10 with nausea and vomiting x 24 hours and fever.   Upon arrival to the ED patient was afebrile, tachycardic and initial CXR was negative. Initial labwork revealed normal WBC 5.9, lactate 1.9, minimally elevated troponin at 0.10. Initially this was thought to be a viral gastroenteritis however due to a rise in temperature to 99.6 and tachycardia patient was started on empiric Vanc and Zosyn and admitted to the hospitalist service for further work-up.   On 2/12 repeat CXR revealed possible left basilar retrocardiac opacity (atx vs pna). Patient was placed on mucinex and nebulizer treatments at that time, although patient denies SOB. Abx narrowed to Zosyn and PCCM was consulted 2/14 PM for further evaluation.   Assessment/Plan:    #1 systemic inflammatory response syndrome - ? viral gastroenteritis vs LLL pneumonia  - appreciate PCCM team following, continue BD's as needed  - ABX discontinued per [facility] 2/15  #2 ? pneumonia - stopped ABX as noted above - monitor clinical response and wean off of oxygen  - add BD scheduled due to more wheezing   #3 elevated troponins - secondary to acute infectious etiology - cariology team signed off, recommended outpatient follow up  - no chest pain   #4 thrombocytopenia - reactive, resolved   #5 hypertension, essential  - continue Lopressor and Norvasc to 5 mg daily.  #6 chronic kidney disease stage III - stable, Cr is WNL  #7 history of coronary artery disease - asymptomatic, no chest pain this AM  #8 sinus tachycardia - HR now within target range   #9 elevated lactic  acid level - stabilized   #10 mild aortic stenosis - 2-D echo canceled per cardiology. Continue low-dose Lopressor.  #11 hyperthyroidism - Continue Tapazole. Continue Lopressor.  #12 prophylaxis Lovenox for DVT prophylaxis.  Code Status: Full.  Family Communication:  plan of care discussed with the patient Disposition Plan: Home by 2/18  IV access:  Peripheral IV  Procedures and diagnostic studies:    Dg Chest 2 View 09/05/2015  Left lung base minimal atelectatic changes. Pneumonia is less likely.  Dg Chest Port 1 View 09/10/2015  Overall mild improvement in the appearance of the pulmonary interstitium. Left lower lobe atelectasis or pneumonia has improved. Mild interval improvement in pulmonary interstitial edema.   Dg Chest Port 1 View 09/07/2015  Possible left basilar retrocardiac capacity -question atelectasis or airspace disease/ pneumonia.  Medical Consultants:  PCCM  Other Consultants:  PT  Faye Ramsay, MD  [facility] Pager [phone]  If 7PM-7AM, please contact night-coverage [email or web address] Password [facility] 09/11/2015, 5:53 PM   LOS: 5 days   HPI/Subjective: No events overnight. More wheezing this AM.  Objective: Filed Vitals:   09/11/15 1011 09/11/15 1353 09/11/15 1410 09/11/15 1728  BP:  141/57    Pulse:  78    Temp:  98.5 F (36.9 C)    TempSrc:  Oral    Resp:  18    Height:      Weight:      SpO2: 100% 96% 93% 93%    Intake/Output Summary (Last 24 hours) at 09/11/15 1753 Last data filed at  09/11/15 1039  Gross per 24 hour  Intake    240 ml  Output    750 ml  Net   -510 ml    Exam:   General:  Pt is alert, follows commands appropriately, not in acute distress  Cardiovascular: Irregular rate and rhythm, no rubs, no gallops  Respiratory: diminished breath sounds at bases, mild exp wheezing mostly at bases   Abdomen: Soft, non tender, non distended, bowel sounds present, no guarding  Extremities: No edema, pulses DP and PT palpable  bilaterally   Data Reviewed: Basic Metabolic Panel:  Recent Labs Lab 09/06/15 0200 09/07/15 0554 09/08/15 1001 09/09/15 0537 09/10/15 0515 09/11/15 0517  NA 139 137 136 137 139 135  K 3.5 3.5 3.0* 3.9 3.9 3.7  CL 110 111 109 111 111 107  CO2 20* 19* 17* 17* 19* 20*  GLUCOSE 122* 105* 117* 156* 133* 96  BUN 25* 20 16 20  22* 20  CREATININE 0.98 0.86 0.59* 0.74 0.71 0.62  CALCIUM 8.1* 7.8* 8.1* 8.1* 8.7* 8.7*  MG 1.2* 2.2 1.8 2.0  --   --   PHOS 2.1*  --   --   --   --   --    Liver Function Tests:  Recent Labs Lab 09/05/15 1935 09/06/15 0200  AST 47* 39  ALT 24 19  ALKPHOS 91 71  BILITOT 1.9* 1.7*  PROT 7.6 6.3*  ALBUMIN 4.2 3.5    Recent Labs Lab 09/05/15 1935  LIPASE 25   CBC:  Recent Labs Lab 09/05/15 1935  09/07/15 0554 09/08/15 1001 09/09/15 0537 09/10/15 0515 09/11/15 0517  WBC 8.9  < > 7.3 6.2 5.2 8.1 8.7  NEUTROABS 8.2*  --   --   --   --   --   --   HGB 12.5*  < > 10.3* 9.5* 9.7* 10.2* 10.2*  HCT 34.6*  < > 28.6* 26.1* 27.3* 29.0* 28.9*  MCV 93.3  < > 92.6 92.6 93.5 94.2 94.1  PLT 161  < > 120* 121* 131* 161 172  < > = values in this interval not displayed. Cardiac Enzymes:  Recent Labs Lab 09/06/15 0200 09/06/15 0657 09/06/15 1350  TROPONINI 0.10* 0.20* 0.24*   BNP: Invalid input(s): POCBNP CBG: No results for input(s): GLUCAP in the last 168 hours.  Recent Results (from the past 240 hour(s))  Culture, blood (Routine X 2) w Reflex to ID Panel     Status: None   Collection Time: 09/05/15  7:30 PM  Result Value Ref Range Status   Specimen Description BLOOD LEFT ANTECUBITAL  Final   Special Requests BOTTLES DRAWN AEROBIC AND ANAEROBIC 5ML  Final   Culture   Final    NO GROWTH 5 DAYS Performed at [facility]    Report Status 09/10/2015 FINAL  Final  Urine culture     Status: None   Collection Time: 09/05/15  8:20 PM  Result Value Ref Range Status   Specimen Description URINE, RANDOM  Final   Special Requests NONE   Final   Culture   Final    MULTIPLE SPECIES PRESENT, SUGGEST RECOLLECTION Performed at [facility]    Report Status 09/07/2015 FINAL  Final  Culture, blood (Routine X 2) w Reflex to ID Panel     Status: None   Collection Time: 09/05/15  8:36 PM  Result Value Ref Range Status   Specimen Description BLOOD RIGHT ANTECUBITAL  Final   Special Requests BOTTLES DRAWN AEROBIC AND ANAEROBIC  5ML  Final   Culture   Final    NO GROWTH 5 DAYS Performed at [facility]    Report Status 09/10/2015 FINAL  Final  Culture, expectorated sputum-assessment     Status: None   Collection Time: 09/06/15  9:30 PM  Result Value Ref Range Status   Specimen Description SPU  Final   Special Requests NONE  Final   Sputum evaluation   Final    MICROSCOPIC FINDINGS SUGGEST THAT THIS SPECIMEN IS NOT REPRESENTATIVE OF LOWER RESPIRATORY SECRETIONS. PLEASE RECOLLECT. [facility] [id number] [place],N    Report Status 09/06/2015 FINAL  Final     Scheduled Meds: . amLODipine  2.5 mg Oral Once  . amLODipine  5 mg Oral Daily  . aspirin EC  81 mg Oral Daily  . clopidogrel  75 mg Oral Q breakfast  . colestipol  2 g Oral Daily  . enoxaparin (LOVENOX) injection  40 mg Subcutaneous Q24H  . feeding supplement  1 Container Oral TID BM  . fluticasone  2 spray Each Nare Daily  . guaiFENesin  1,200 mg Oral BID  . hyoscyamine  0.125 mg Sublingual QHS  . levalbuterol  1.25 mg Nebulization Q4H  . loratadine  10 mg Oral Daily  . LORazepam  0.5 mg Intravenous Once  . methimazole  5 mg Oral QODAY  . metoprolol tartrate  12.5 mg Oral BID  . potassium chloride  40 mEq Oral Once  . sodium chloride flush  3 mL Intravenous Q12H   Continuous Infusions:

## 2015-09-11 NOTE — Patient Instructions (Addendum)
Half Squat to Chair    Stand with feet shoulder width apart. Push buttocks backward and lower slowly, touching chair lightly and returning to standing position. Can use arms/armrests if necessary. Complete _2__ sets of __10_ repetitions. Perform _2__ sessions per day.  Hip Side Kick    Holding a counter for balance, keep legs shoulder width apart and toes pointed forward. Swing a leg out to side, keeping knee straight. Do not lean.  Complete 2 sets of 10 repetitions.  Perform __2__ sessions per day.  Hip Backward Kick    Using a counter for balance, keep legs shoulder width apart and toes pointed for- ward. Slowly extend one leg back, keeping knee straight. Do not lean forward.  Complete 2 sets of 10 repetitions. Perform __2__ sessions per day.  "I love a Database administrator    Using a counter for balance, march in place as high as you can. Complete 2 sets of 10 repetitions. Perform __2__ sessions per day.  FLEXION: Standing - Stable (Active)    Using a counter for balance, stand with both feet flat. Bend right knee, bringing heel toward buttocks. Complete _2__ sets of __10_ repetitions. Perform _2__ sessions per day.  EXTENSION: Sitting (Active)    Sit with feet flat. Straighten knee. Complete _2__ sets of _10__ repetitions. Perform __2_ sessions per day. Copyright  [employer]. All rights reserved.

## 2015-09-11 NOTE — Progress Notes (Signed)
Physical Therapy Treatment Patient Details Name: Kurt Elliott MRN: [id number] DOB: June 14, 1921 Today's Date: 09/11/2015    History of Present Illness 82 yo male admitted with SIRS. Hx of HTN, vertigo, unsteady gait, aortic stenosis, spinal stenosis, CKD, CAD, arthritis    PT Comments    Progressing with mobility.Pt reported some fatigue on today. Issued HEP and performed 1 set of 10 reps on today.   Follow Up Recommendations  Home health PT;Supervision/Assistance - 24 hour     Equipment Recommendations  None recommended by PT    Recommendations for Other Services       Precautions / Restrictions Precautions Precautions: Fall Restrictions Weight Bearing Restrictions: No    Mobility  Bed Mobility Overal bed mobility: Needs Assistance Bed Mobility: Supine to Sit     Supine to sit: Min assist     General bed mobility comments: increased time and encouragement. Assist to get to EOB  Transfers Overall transfer level: Needs assistance Equipment used: Rolling walker (2 wheeled) Transfers: Sit to/from Stand Sit to Stand: Min assist Stand pivot transfers: Min assist       General transfer comment: Assist to rise, stabilize. VCs safety, hand placement  Ambulation/Gait Ambulation/Gait assistance: Min guard Ambulation Distance (Feet): 200 Feet Assistive device: Rolling walker (2 wheeled) Gait Pattern/deviations: Step-through pattern;Decreased stride length     General Gait Details: 2/4 dyspnea, no LOB, verbal cues for positioning in RW and to increase step length   Stairs            Wheelchair Mobility    Modified Rankin (Stroke Patients Only)       Balance                                    Cognition Arousal/Alertness: Awake/alert Behavior During Therapy: WFL for tasks assessed/performed Overall Cognitive Status: Within Functional Limits for tasks assessed                      Exercises General Exercises - Lower  Extremity Long Arc Quad: AROM;Both;10 reps;Seated Hip ABduction/ADduction: AROM;Both;10 reps;Standing Hip Flexion/Marching: AROM;Both;Standing Heel Raises: AROM;Both;10 reps;Standing Knee flexion: AROM; Both, 10 reps; Standing Other Exercises Other Exercises: sit to stand, 5 reps, with use of armrests    General Comments        Pertinent Vitals/Pain Pain Assessment: No/denies pain    Home Living                      Prior Function            PT Goals (current goals can now be found in the care plan section) Progress towards PT goals: Progressing toward goals    Frequency  Min 3X/week    PT Plan Current plan remains appropriate    Co-evaluation             End of Session Equipment Utilized During Treatment: Gait belt Activity Tolerance: Patient tolerated treatment well Patient left: in chair;with call bell/phone within reach;with family/visitor present     Time: 1539-1606 PT Time Calculation (min) (ACUTE ONLY): 27 min  Charges:  $Gait Training: 8-22 mins $Therapeutic Exercise: 8-22 mins                    G Codes:      Weston Anna, MPT Pager: [phone]

## 2015-09-11 NOTE — Progress Notes (Signed)
Occupational Therapy Treatment Patient Details Name: Kurt Elliott MRN: [id number] DOB: 1921-02-27 Today's Date: 09/11/2015    History of present illness 82 yo male admitted with SIRS. Hx of HTN, vertigo, unsteady gait, aortic stenosis, spinal stenosis, CKD, CAD, arthritis   OT comments  Pt continues to make progress  Follow Up Recommendations  Home health OT    Equipment Recommendations  None recommended by OT    Recommendations for Other Services      Precautions / Restrictions Precautions Precautions: Fall Restrictions Weight Bearing Restrictions: No       Mobility Bed Mobility   Bed Mobility: Supine to Sit     Supine to sit: Supervision     General bed mobility comments: increased time and encouragement  Transfers Overall transfer level: Needs assistance Equipment used: Rolling walker (2 wheeled) Transfers: Sit to/from [facility] Sit to Stand: Min assist Stand pivot transfers: Min assist       General transfer comment: VC for hand placement        ADL Overall ADL's : Needs assistance/impaired                             Toileting- Clothing Manipulation and Hygiene: Sit to/from stand;Cueing for sequencing;Cueing for safety;Minimal assistance Toileting - Clothing Manipulation Details (indicate cue type and reason): sit to stand with urinal     Functional mobility during ADLs: Minimal assistance;Rolling walker General ADL Comments: Pt participated in 10 sit to stand transitions in prep for increased I with ADL activity . Pt needed VC for hand placment but needed less VC with practice.  Noted wheezing during session. RN aware.                 Cognition   Behavior During Therapy: WFL for tasks assessed/performed Overall Cognitive Status: Within Functional Limits for tasks assessed                       Extremity/Trunk Assessment                          Pertinent Vitals/ Pain       Pain  Assessment: No/denies pain  Home Living                                          Prior Functioning/Environment              Frequency Min 2X/week     Progress Toward Goals  OT Goals(current goals can now be found in the care plan section)  Progress towards OT goals: Progressing toward goals     Plan Discharge plan needs to be updated    Co-evaluation                 End of Session Equipment Utilized During Treatment: Rolling walker   Activity Tolerance Patient tolerated treatment well   Patient Left in chair;with call bell/phone within reach;with chair alarm set;with family/visitor present (family member asleep)   Nurse Communication Mobility status        Time: 1310-1330 OT Time Calculation (min): 20 min  Charges: OT General Charges $OT Visit: 1 Procedure OT Treatments $Self Care/Home Management : 8-22 mins  Kian Gamarra, Thereasa Parkin 09/11/2015, 1:52 PM

## 2015-09-11 NOTE — Consult Note (Signed)
Name: Kurt Elliott MRN: [id number] DOB: 23-Oct-1920    ADMISSION DATE:  09/05/2015 CONSULTATION DATE:  09/10/15  REFERRING MD :  Dr. Grandville Silos   CHIEF COMPLAINT:  Nausea / Vomiting.  Concern for PNA    HISTORY OF PRESENT ILLNESS:  82 y/o male with PMH of HTN, adenocarcinoma of prostate (on Lupron, followed by Dr. Jana Hakim), hyperthyroidism, GERD, CKD III, CAD who presented to [facility] on 2/10 with nausea and vomiting x 24 hours and fever.   Upon arrival to the ED patient was afebrile, tachycardic and initial CXR was negative. Initial labwork revealed normal WBC 5.9, lactate 1.9, minimally elevated troponin at 0.10. Initially this was thought to be a viral gastroenteritis however due to a rise in temperature to 99.6 and tachycardia patient was started on empiric Vanc and Zosyn and admitted to the hospitalist service for further work-up.   On 2/12 repeat CXR revealed possible left basilar retrocardiac opacity (atx vs pna). Patient was placed on mucinex and nebulizer treatments at that time, although patient denies SOB. Abx narrowed to Zosyn and PCCM was consulted 2/14 PM for further evaluation.   Currently, the patient reports he initially presented for an acute episode of nausea and vomiting that he attributes to happening after eating soup.  The patient reported relief / feeling some better after vomiting.  Currently, he denies fevers, chills, nausea, vomiting, diarrhea, shortness of breath, chest pain, pain with inspiration.  He does endorse occasional wheezing, post nasal drip and feeling "weak in the legs".  Reports occasional cough with "transparent" sputum production.   SUBJECTIVE: pt and family reported in am some wheezing  VITAL SIGNS: Temp:  [97.5 F (36.4 C)-98.5 F (36.9 C)] 98.5 F (36.9 C) (02/16 1353) Pulse Rate:  [78-97] 78 (02/16 1353) Resp:  [18-20] 18 (02/16 1353) BP: (141-167)/(57-68) 141/57 mmHg (02/16 1353) SpO2:  [93 %-100 %] 93 % (02/16 1410)  PHYSICAL  EXAMINATION: General:  Well developed elderly male up in chair and stabnding Neuro:  AAOx4, speech clear, MAE, normal strength HEENT:  No stridor Cardiovascular:  s1s2 rrr, no m/r/g Lungs:  Even/non-labored, lungs bilaterally clear, slight coarse, NO egophony left base Abdomen:  NTND, bsx4 active to increased Musculoskeletal:  No acute deformities  Skin:  Warm/dry, no edema , dry skin PSY:  Normal   Recent Labs Lab 09/09/15 0537 09/10/15 0515 09/11/15 0517  NA 137 139 135  K 3.9 3.9 3.7  CL 111 111 107  CO2 17* 19* 20*  BUN 20 22* 20  CREATININE 0.74 0.71 0.62  GLUCOSE 156* 133* 96    Recent Labs Lab 09/09/15 0537 09/10/15 0515 09/11/15 0517  HGB 9.7* 10.2* 10.2*  HCT 27.3* 29.0* 28.9*  WBC 5.2 8.1 8.7  PLT 131* 161 172   Dg Chest Port 1 View  09/10/2015  CLINICAL DATA:  Cough, shortness of breath, coronary artery disease, valvular heart disease, left lower lobe pneumonia. EXAM: PORTABLE CHEST 1 VIEW COMPARISON:  Portable chest x-ray of September 07, 2015 FINDINGS: The lungs are adequately inflated. The interstitial markings are less prominent today. The retrocardiac region on the left remains dense but a portion of the hemidiaphragm is now visible. The cardiac silhouette remains enlarged. The central pulmonary vascularity is prominent. Mild perihilar subsegmental atelectasis on the right persists. IMPRESSION: Overall mild improvement in the appearance of the pulmonary interstitium. Left lower lobe atelectasis or pneumonia has improved. Mild interval improvement in pulmonary interstitial edema. Electronically Signed   By: David  Martinique M.D.   On:  09/10/2015 07:25    SIGNIFICANT EVENTS  2/10  Admit with complaints of N/V 2/12  Concern for CXR with questionable left retrocardiac infiltrate  STUDIES:  SLP>>>   ASSESSMENT / PLAN:  LLL Retrocardiac Opacity - Atelectasis LIkely Chronic INT changes R/o silent aspiration Intermittent wheezing, r/o upper airway  secretions  Plan:  Agree that risk benefit ratio DOES NOT favor ABX Continue Mucinex  bronchodilators PRN, likely wheezing is upper airway related PT efforts flonase addition SLP Ambulatory pulse ox If wheezing reoccurs with neg above, then would need PFT outpt (doubt will need)  Updated the pt and family members in [place]. Titus Mould, MD, [place] Pgr: [facility]

## 2015-09-12 LAB — CBC
HEMATOCRIT: 24.8 % — AB (ref 39.0–52.0)
HEMOGLOBIN: 8.9 g/dL — AB (ref 13.0–17.0)
MCH: 33.5 pg (ref 26.0–34.0)
MCHC: 35.9 g/dL (ref 30.0–36.0)
MCV: 93.2 fL (ref 78.0–100.0)
Platelets: 144 10*3/uL — ABNORMAL LOW (ref 150–400)
RBC: 2.66 MIL/uL — ABNORMAL LOW (ref 4.22–5.81)
RDW: 13.7 % (ref 11.5–15.5)
WBC: 7.3 10*3/uL (ref 4.0–10.5)

## 2015-09-12 LAB — BASIC METABOLIC PANEL
Anion gap: 8 (ref 5–15)
BUN: 19 mg/dL (ref 6–20)
CALCIUM: 8.6 mg/dL — AB (ref 8.9–10.3)
CO2: 22 mmol/L (ref 22–32)
CREATININE: 0.66 mg/dL (ref 0.61–1.24)
Chloride: 106 mmol/L (ref 101–111)
GFR calc non Af Amer: >60 mL/min (ref >60)
Glucose, Bld: 90 mg/dL (ref 65–99)
Potassium: 3.7 mmol/L (ref 3.5–5.1)
Sodium: 136 mmol/L (ref 135–145)

## 2015-09-12 MED ORDER — LORAZEPAM 0.5 MG PO TABS: 0.5000 mg | ORAL_TABLET | Freq: Two times a day (BID) | ORAL | Status: DC | PRN

## 2015-09-12 MED ORDER — LEVALBUTEROL HCL 0.63 MG/3ML IN NEBU: 0.6300 mg | INHALATION_SOLUTION | Freq: Four times a day (QID) | RESPIRATORY_TRACT | Status: DC | PRN

## 2015-09-12 MED ORDER — AMLODIPINE BESYLATE 5 MG PO TABS: 5.0000 mg | ORAL_TABLET | Freq: Every day | ORAL | Status: DC

## 2015-09-12 MED ORDER — GUAIFENESIN-DM 100-10 MG/5ML PO SYRP: 5.0000 mL | ORAL_SOLUTION | ORAL | Status: AC | PRN

## 2015-09-12 MED ORDER — HYDROCODONE-ACETAMINOPHEN 5-325 MG PO TABS: 1.0000 | ORAL_TABLET | ORAL | Status: DC | PRN

## 2015-09-12 MED ORDER — GUAIFENESIN ER 600 MG PO TB12: 1200.0000 mg | ORAL_TABLET | Freq: Two times a day (BID) | ORAL | Status: DC

## 2015-09-12 MED ORDER — FLUTICASONE PROPIONATE 50 MCG/ACT NA SUSP: 2.0000 | Freq: Every day | NASAL | Status: DC

## 2015-09-12 MED ORDER — TRAZODONE HCL 50 MG PO TABS: 25.0000 mg | ORAL_TABLET | Freq: Every evening | ORAL | Status: DC | PRN

## 2015-09-12 NOTE — Progress Notes (Addendum)
Occupational Therapy Treatment Patient Details Name: Kurt Elliott MRN: [id number] DOB: Oct 15, 1920 Today's Date: 09/12/2015    History of present illness 82 yo male admitted with SIRS. Hx of HTN, vertigo, unsteady gait, aortic stenosis, spinal stenosis, CKD, CAD, arthritis   OT comments  Pt up to perform grooming at the sink this am. Daughter present for session. Pt able to wash face and brush teeth in standing for approximately 10 minutes with min guard assist. Daughter plans to stay with pt and his spouse initially during the day and have hired help for night time. Pt tolerated activity well.    Follow Up Recommendations  Home health OT;Supervision/Assistance - 24 hour    Equipment Recommendations  None recommended by OT    Recommendations for Other Services      Precautions / Restrictions Precautions Precautions: Fall Restrictions Weight Bearing Restrictions: No       Mobility Bed Mobility Overal bed mobility: Needs Assistance Bed Mobility: Supine to Sit     Supine to sit: Min guard;HOB elevated     General bed mobility comments: increased time.   Transfers Overall transfer level: Needs assistance Equipment used: Rolling walker (2 wheeled) Transfers: Sit to/from Stand Sit to Stand: Min guard         General transfer comment: min guard to stand with verbal cues for hand placement. min assist to safely descend to sit in chair.     Balance     Sitting balance-Leahy Scale: Good       Standing balance-Leahy Scale: Fair                     ADL       Grooming: Min guard;Standing;Oral Investment banker, corporate: Magazine features editor Details (indicate cue type and reason): standing to use urinal.  Toileting- Clothing Manipulation and Hygiene: Min guard Toileting - Clothing Manipulation Details (indicate cue type and reason): standing.       General ADL Comments: Daughter present for session and states  she will be with pt and spouse during the day and they have hired assistance for night time. She states they have a 3in1 already. Discussed safety with hand placement for functional transfers and safe use of walker. Pt tending to push walker too far in front of him and pushing walker slightly into doorway trying to exit bathroom. Pt also needed several verbal cues for completing tasks at the sink (ie reminders to use cup to rinse, finishing one task before attempting another.)      Vision                     Perception     Praxis      Cognition   Behavior During Therapy: [facility] for tasks assessed/performed Overall Cognitive Status: Within Functional Limits for tasks assessed                       Extremity/Trunk Assessment               Exercises     Shoulder Instructions       General Comments      Pertinent Vitals/ Pain       Pain Assessment: No/denies pain  Home Living  Prior Functioning/Environment              Frequency Min 2X/week     Progress Toward Goals  OT Goals(current goals can now be found in the care plan section)  Progress towards OT goals: Progressing toward goals     Plan Discharge plan remains appropriate    Co-evaluation                 End of Session Equipment Utilized During Treatment: Rolling walker   Activity Tolerance Patient tolerated treatment well   Patient Left in chair;with call bell/phone within reach;with family/visitor present (family states she will be with pt today)   Nurse Communication          [id number] OT Time Calculation (min): 23 min  Charges: OT General Charges $OT Visit: 1 Procedure OT Treatments $Self Care/Home Management : 8-22 mins $Therapeutic Activity: 8-22 mins  Kurt Elliott  [id number] 09/12/2015, 10:38 AM

## 2015-09-12 NOTE — Progress Notes (Signed)
Name: Kurt Elliott MRN: [id number] DOB: 04-Jul-1921    ADMISSION DATE:  09/05/2015 CONSULTATION DATE:  09/10/15  REFERRING MD :  Dr. Grandville Silos   CHIEF COMPLAINT:  Nausea / Vomiting.  Concern for PNA    HISTORY OF PRESENT ILLNESS:  82 y/o male with PMH of HTN, adenocarcinoma of prostate (on Lupron, followed by Dr. Jana Hakim), hyperthyroidism, GERD, CKD III, CAD who presented to [facility] on 2/10 with nausea and vomiting x 24 hours and fever.   Upon arrival to the ED patient was afebrile, tachycardic and initial CXR was negative. Initial labwork revealed normal WBC 5.9, lactate 1.9, minimally elevated troponin at 0.10. Initially this was thought to be a viral gastroenteritis however due to a rise in temperature to 99.6 and tachycardia patient was started on empiric Vanc and Zosyn and admitted to the hospitalist service for further work-up.   On 2/12 repeat CXR revealed possible left basilar retrocardiac opacity (atx vs pna). Patient was placed on mucinex and nebulizer treatments at that time, although patient denies SOB. Abx narrowed to Zosyn and PCCM was consulted 2/14 PM for further evaluation.   Currently, the patient reports he initially presented for an acute episode of nausea and vomiting that he attributes to happening after eating soup.  The patient reported relief / feeling some better after vomiting.  Currently, he denies fevers, chills, nausea, vomiting, diarrhea, shortness of breath, chest pain, pain with inspiration.  He does endorse occasional wheezing, post nasal drip and feeling "weak in the legs".  Reports occasional cough with "transparent" sputum production.     SUBJECTIVE:  Pt anxious to go home.  Hopeful for discharge after ambulation and swallow testing.  Dry cough overnight that responded to mucinex  VITAL SIGNS: Temp:  [98.5 F (36.9 C)-98.8 F (37.1 C)] 98.6 F (37 C) (02/17 0528) Pulse Rate:  [76-91] 76 (02/17 0528) Resp:  [18-20] 18 (02/17 0528) BP:  (109-148)/(46-57) 109/46 mmHg (02/17 0528) SpO2:  [93 %-96 %] 94 % (02/17 0852)  PHYSICAL EXAMINATION: General:  Well developed elderly male up to chair, in NAD Neuro:  AAOx4, speech clear, MAE, normal strength HEENT:  No stridor, mm pink/moist, good dentition Cardiovascular:  s1s2 rrr, no m/r/g Lungs:  Even/non-labored, lungs bilaterally clear, No egophony or wheezing Abdomen:  NTND, bsx4 active to increased Musculoskeletal:  No acute deformities  Skin:  Warm/dry, no edema , dry skin PSY:  Normal, pleasant elderly male   Recent Labs Lab 09/10/15 0515 09/11/15 0517 09/12/15 0526  NA 139 135 136  K 3.9 3.7 3.7  CL 111 107 106  CO2 19* 20* 22  BUN 22* 20 19  CREATININE 0.71 0.62 0.66  GLUCOSE 133* 96 90    Recent Labs Lab 09/10/15 0515 09/11/15 0517 09/12/15 0526  HGB 10.2* 10.2* 8.9*  HCT 29.0* 28.9* 24.8*  WBC 8.1 8.7 7.3  PLT 161 172 144*   No results found.  SIGNIFICANT EVENTS  2/10  Admit with complaints of N/V 2/12  Concern for CXR with questionable left retrocardiac infiltrate  STUDIES:  SLP 2/17 >>    ASSESSMENT / PLAN:  LLL Retrocardiac Opacity - Atelectasis Likely Chronic INT changes R/o silent aspiration Intermittent wheezing, r/o upper airway secretions  Plan:  Agree that risk benefit ratio DOES NOT favor ABX Continue Mucinex  Bronchodilators PRN, likely wheezing is upper airway related PT efforts Flonase  Await SLP assessment to ensure no silent aspiration Ambulatory pulse ox If wheezing reoccurs with neg above, then would need PFT outpt (  doubt will need)   Ok for discharge upon completion of ambulatory saturation assessment (assuming will be normal) and swallow evaluation by SLP.  Discharge with PRN albuterol, mucinex PRN cough/congestion, flonase or nasacort daily   Noe Gens, NP-C [facility] Pgr: [phone] or if no answer [phone] 09/12/2015, 11:30 AM  PCCM Attending Note: Patient seen and examined with  nurse practitioner. Please refer to her progress note which I have reviewed in detail. At the time of my exam patient was eating lunch. Denies any dysphagia or odynophagia. Reports cough is improving significantly. At the time of my exam had not yet ambulated in the hallway either. Speech therapy report not formalized at the time of my exam. Patient denies any dyspnea. No wheezing on exam by me. Reports minimal to no nasal congestion at this time on Mucinex.  Sonia Baller Ashok Cordia, M.D. [facility] Pager: [phone] After 3pm or if no response, call [phone]

## 2015-09-12 NOTE — Evaluation (Signed)
Clinical/Bedside Swallow Evaluation Patient Details  Name: Kurt Elliott MRN: [id number] Date of Birth: 30-Dec-1920  Today's Date: 09/12/2015 Time: SLP Start Time (ACUTE ONLY): 1300 SLP Stop Time (ACUTE ONLY): 1315 SLP Time Calculation (min) (ACUTE ONLY): 15 min  Past Medical History:  Past Medical History  Diagnosis Date  . Hypertension   . Hyperlipemia   . Unspecified disorder of liver   . Cellulitis, leg     left  . Pulmonary nodule, right     2008 / unchanged CT scan, July, 2009  . Adenocarcinoma of prostate ([place])   . Esophageal stricture   . Spinal stenosis   . Hyperglycemia   . Hyperthyroidism   . GERD (gastroesophageal reflux disease)   . Hx of colonic polyps   . Renal artery stenosis (HCC)     Mild  . Atheroma     no penetrating ulcer, Abdominal aorta  . Inferior mesenteric artery injury     High-grade stenosis, CT scan, 2006  . Vertigo   . Diverticulum   . Incisional hernia   . Rash     Ticlid  . Cough     ACE Inhibitor, tolerates ARB  . Carotid artery disease ([place])     Doppler, 2006, minimal disease  //  Doppler, May, 2012, mild increase velocities, 40-59% bilateral stenoses  . Renal cyst     Hyperdense cyst lower pole left kidney  . CAD (coronary artery disease), native coronary artery     a. cath 2008  b. cath 10/21/2014 3v dzs w/ patent RCA stent, severe stenosis of mid LCx/OM1 s/p DES, moderately severe diffuse mid LAD treated with DES, patent prox LAD stent;  c. 01/2015 Cath: patent LAD, LCX, OM, and RCA stents->Med Rx.  . Mild aortic stenosis     a. 09/2014 Echo: EF 65-70%, Gr 1 DD, no rwma, mild AS;  b. 01/2015 Echo: EF 65-70%, mod LVH, no rwma, 27mmHg intracavitary gradient, mildly dil LA.   Past Surgical History:  Past Surgical History  Procedure Laterality Date  . Esophagogastroduodenoscopy    . Cardiac catheterization    . Incisional hernia repair    . Left heart catheterization with coronary angiogram N/A 10/21/2014    Procedure: LEFT HEART  CATHETERIZATION WITH CORONARY ANGIOGRAM;  Surgeon: Sherren Mocha, MD;  Location: [facility] CATH LAB;  Service: Cardiovascular;  Laterality: N/A;  . Percutaneous coronary stent intervention (pci-s)  10/21/2014    Procedure: PERCUTANEOUS CORONARY STENT INTERVENTION (PCI-S);  Surgeon: Sherren Mocha, MD;  Location: [facility] CATH LAB;  Service: Cardiovascular;;  Mid LAD and OM1  . Sigmoid resection / rectopexy    . Cardiac catheterization N/A 02/18/2015    Procedure: Right/Left Heart Cath and Coronary Angiography;  Surgeon: Leonie Man, MD;  Location: [place] CV LAB;  Service: Cardiovascular;  Laterality: N/A;   HPI:  82 y/o male with PMH of HTN, adenocarcinoma of prostate, hyperthyroidism, GERD, CKD III, CAD who presented to [facility] on 2/10 with nausea and vomiting x 24 hours and fever.   Assessment / Plan / Recommendation Clinical Impression  Patient presents with a functional oropharyngeal swallow with no overt s/s of aspiration observed. Patient had a timely swallow initiation, with good pharyngeal contraction and laryngeal elevation per palpation. Patient has a h/o GERD and stated that he takes medication for it, but does not recall the name. BSE did not indicate need for objective swallow study, however please note that silent aspiration cannot be r/o at bedside.    Aspiration Risk  No  limitations    Diet Recommendation Regular;Thin liquid   Liquid Administration via: Straw;Cup Medication Administration: Whole meds with liquid Supervision: Patient able to self feed Postural Changes: Seated upright at 90 degrees;Remain upright for at least 30 minutes after po intake    Other  Recommendations     Follow up Recommendations  None    Frequency and Duration     N/A       Prognosis   N/A     Swallow Study   General Date of Onset: 09/05/15 HPI: 83 y/o male with PMH of HTN, adenocarcinoma of prostate, hyperthyroidism, GERD, CKD III, CAD who presented to [facility] on 2/10 with nausea and vomiting x  24 hours and fever. Type of Study: Bedside Swallow Evaluation Previous Swallow Assessment: N/A Diet Prior to this Study: Regular;Thin liquids Temperature Spikes Noted: No Respiratory Status: Room air History of Recent Intubation: No Behavior/Cognition: Alert;Cooperative;Pleasant mood Oral Care Completed by SLP: No Oral Cavity - Dentition: Adequate natural dentition Vision: Functional for self-feeding Self-Feeding Abilities: Able to feed self Patient Positioning: Upright in chair Baseline Vocal Quality: Normal Volitional Cough: Strong Volitional Swallow: Able to elicit    Oral/Motor/Sensory Function Overall Oral Motor/Sensory Function: Within functional limits   Ice Chips Ice chips: Not tested   Thin Liquid Thin Liquid: Within functional limits Presentation: Cup;Straw;Self Fed    Nectar Thick Nectar Thick Liquid: Not tested   Honey Thick Honey Thick Liquid: Not tested   Puree Puree: Within functional limits Presentation: Self Fed;Spoon   Solid   GO   Solid: Within functional limits Presentation: Self Kenard Gower 09/12/2015,3:24 PM    Sonia Baller, MA, CCC-SLP 09/12/2015 3:24 PM

## 2015-09-12 NOTE — Discharge Summary (Signed)
Physician Discharge Summary  Kurt Elliott [id number] DOB: June 19, 1921 DOA: 09/05/2015  PCP: Renato Shin, MD  Admit date: 09/05/2015 Discharge date: 09/12/2015  Recommendations for Outpatient Follow-up:  1. Pt will need to follow up with PCP in 2-3 weeks post discharge 2. Please obtain BMP to evaluate electrolytes and kidney function 3. Please also check CBC to evaluate Hg and Hct levels  Discharge Diagnoses:    Hyperlipidemia   Essential hypertension   CAD (coronary artery disease)   Carotid artery disease (HCC)   Aortic stenosis   Sinus tachycardia (HCC)   CKD (chronic kidney disease) stage 3, GFR 30-59 ml/min   Mitral regurgitation   Dehydration   Elevated troponin   Left lower lobe pneumonia   Fever  Discharge Condition: Stable  Diet recommendation: Heart healthy diet discussed in details    Brief narrative:    82 y/o male with PMH of HTN, adenocarcinoma of prostate (on Lupron, followed by Dr. Jana Hakim), hyperthyroidism, GERD, CKD III, CAD who presented to [facility] on 2/10 with nausea and vomiting x 24 hours and fever.   Upon arrival to the ED patient was afebrile, tachycardic and initial CXR was negative. Initial labwork revealed normal WBC 5.9, lactate 1.9, minimally elevated troponin at 0.10. Initially this was thought to be a viral gastroenteritis however due to a rise in temperature to 99.6 and tachycardia patient was started on empiric Vanc and Zosyn and admitted to the hospitalist service for further work-up.   On 2/12 repeat CXR revealed possible left basilar retrocardiac opacity (atx vs pna). Patient was placed on mucinex and nebulizer treatments at that time, although patient denies SOB. Abx narrowed to Zosyn and PCCM was consulted 2/14 PM for further evaluation.   Assessment/Plan:    #1 systemic inflammatory response syndrome - ? viral gastroenteritis vs LLL pneumonia  - appreciate PCCM team following, continue BD's as needed upon discharge  - ABX  discontinued per [facility] 2/15  #2 ? pneumonia - stopped ABX as noted above - PNA resolved   #3 elevated troponins - secondary to acute infectious etiology - cariology team signed off, recommended outpatient follow up  - no chest pain   #4 thrombocytopenia - reactive  #5 hypertension, essential  - continue home medical regimen   #6 chronic kidney disease stage III - stable, Cr is WNL  #7 history of coronary artery disease - asymptomatic, no chest pain this AM  #8 sinus tachycardia - HR now within target range   #9 elevated lactic acid level - stabilized   #10 mild aortic stenosis - 2-D echo canceled per cardiology. Continue low-dose Lopressor.  #11 hyperthyroidism - Continue Tapazole. Continue Lopressor.   Code Status: Full.  Family Communication: plan of care discussed with the patient and family at bedside  Disposition Plan: Home   IV access:  Peripheral IV  Procedures and diagnostic studies:   Dg Chest 2 View 09/05/2015 Left lung base minimal atelectatic changes. Pneumonia is less likely.  Dg Chest Port 1 View 09/10/2015 Overall mild improvement in the appearance of the pulmonary interstitium. Left lower lobe atelectasis or pneumonia has improved. Mild interval improvement in pulmonary interstitial edema.   Dg Chest Port 1 View 09/07/2015 Possible left basilar retrocardiac capacity -question atelectasis or airspace disease/ pneumonia.  Medical Consultants:  PCCM  Other Consultants:  PT      Discharge Exam: Filed Vitals:   09/12/15 0257 09/12/15 0528  BP:  109/46  Pulse: 78 76  Temp:  98.6 F (37 C)  Resp: 18 18   Filed Vitals:   09/11/15 2142 09/12/15 0257 09/12/15 0528 09/12/15 0852  BP:   109/46   Pulse: 90 78 76   Temp:   98.6 F (37 C)   TempSrc:   Axillary   Resp: 20 18 18    Height:      Weight:      SpO2: 94% 94% 93% 94%    General: Pt is alert, follows commands appropriately, not in acute  distress Cardiovascular: Regular rate and rhythm, no rubs, no gallops Respiratory: Clear to auscultation bilaterally, no wheezing, diminished breath sounds at bases  Abdominal: Soft, non tender, non distended, bowel sounds +, no guarding  Discharge Instructions  Discharge Instructions    Diet - low sodium heart healthy    Complete by:  As directed      Increase activity slowly    Complete by:  As directed             Medication List    TAKE these medications        amLODipine 5 MG tablet  Commonly known as:  NORVASC  Take 1 tablet (5 mg total) by mouth daily.     aspirin EC 81 MG tablet  Take 81 mg by mouth daily.     clopidogrel 75 MG tablet  Commonly known as:  PLAVIX  Take 1 tablet (75 mg total) by mouth daily with breakfast.     colestipol 1 g tablet  Commonly known as:  COLESTID  Take 2 g by mouth daily.     fluticasone 50 MCG/ACT nasal spray  Commonly known as:  FLONASE  Place 2 sprays into both nostrils daily.     guaiFENesin 600 MG 12 hr tablet  Commonly known as:  MUCINEX  Take 2 tablets (1,200 mg total) by mouth 2 (two) times daily.     guaiFENesin-dextromethorphan 100-10 MG/5ML syrup  Commonly known as:  ROBITUSSIN DM  Take 5 mLs by mouth every 4 (four) hours as needed for cough.     HYDROcodone-acetaminophen 5-325 MG tablet  Commonly known as:  NORCO/VICODIN  Take 1-2 tablets by mouth every 4 (four) hours as needed for moderate pain.     hyoscyamine 0.125 MG tablet  Commonly known as:  LEVSIN  Take 1 tablet (0.125 mg total) by mouth at bedtime.     levalbuterol 0.63 MG/3ML nebulizer solution  Commonly known as:  XOPENEX  Take 3 mLs (0.63 mg total) by nebulization every 6 (six) hours as needed for wheezing or shortness of breath.     LORazepam 0.5 MG tablet  Commonly known as:  ATIVAN  Take 1 tablet (0.5 mg total) by mouth 2 (two) times daily as needed for anxiety.     LUPRON DEPOT IM  Inject into the muscle as directed. Done at [place]  office approx once every 3 months     methimazole 5 MG tablet  Commonly known as:  TAPAZOLE  TAKE 1 TABLET BY MOUTH 3 TIMES A WEEK (MON, WED, AND FRI)     metoprolol tartrate 25 MG tablet  Commonly known as:  LOPRESSOR  Take 0.5 tablets (12.5 mg total) by mouth 2 (two) times daily.     nitroGLYCERIN 0.4 MG SL tablet  Commonly known as:  NITROSTAT  Place 0.4 mg under the tongue every 5 (five) minutes as needed for chest pain.     pantoprazole 40 MG tablet  Commonly known as:  PROTONIX  TAKE 1 TABLET (40 MG TOTAL) BY  MOUTH DAILY.     traZODone 50 MG tablet  Commonly known as:  DESYREL  Take 0.5 tablets (25 mg total) by mouth at bedtime as needed for sleep.            Follow-up Information    Follow up with [facility].   Why:  HHPT   Contact information:   [place] [id number] [phone]       Follow up with Renato Shin, MD.   Specialty:  Endocrinology   Contact information:   [address] [employer] [place]  [place] [address] [phone]       Call Faye Ramsay, MD.   Specialty:  Internal Medicine   Why:  As needed [phone]   Contact information:   [address] [place] [address] [phone]        The results of significant diagnostics from this hospitalization (including imaging, microbiology, ancillary and laboratory) are listed below for reference.     Microbiology: Recent Results (from the past 240 hour(s))  Culture, blood (Routine X 2) w Reflex to ID Panel     Status: None   Collection Time: 09/05/15  7:30 PM  Result Value Ref Range Status   Specimen Description BLOOD LEFT ANTECUBITAL  Final   Special Requests BOTTLES DRAWN AEROBIC AND ANAEROBIC 5ML  Final   Culture   Final    NO GROWTH 5 DAYS Performed at [facility]    Report Status 09/10/2015 FINAL  Final  Urine culture     Status: None   Collection Time: 09/05/15  8:20 PM  Result Value Ref Range  Status   Specimen Description URINE, RANDOM  Final   Special Requests NONE  Final   Culture   Final    MULTIPLE SPECIES PRESENT, SUGGEST RECOLLECTION Performed at [facility]    Report Status 09/07/2015 FINAL  Final  Culture, blood (Routine X 2) w Reflex to ID Panel     Status: None   Collection Time: 09/05/15  8:36 PM  Result Value Ref Range Status   Specimen Description BLOOD RIGHT ANTECUBITAL  Final   Special Requests BOTTLES DRAWN AEROBIC AND ANAEROBIC 5ML  Final   Culture   Final    NO GROWTH 5 DAYS Performed at [facility]    Report Status 09/10/2015 FINAL  Final  Culture, expectorated sputum-assessment     Status: None   Collection Time: 09/06/15  9:30 PM  Result Value Ref Range Status   Specimen Description SPU  Final   Special Requests NONE  Final   Sputum evaluation   Final    MICROSCOPIC FINDINGS SUGGEST THAT THIS SPECIMEN IS NOT REPRESENTATIVE OF LOWER RESPIRATORY SECRETIONS. PLEASE RECOLLECT. [facility] [id number] [place]    Report Status 09/06/2015 FINAL  Final     Labs: Basic Metabolic Panel:  Recent Labs Lab 09/06/15 0200 09/07/15 0554 09/08/15 1001 09/09/15 0537 09/10/15 0515 09/11/15 0517 09/12/15 0526  NA 139 137 136 137 139 135 136  K 3.5 3.5 3.0* 3.9 3.9 3.7 3.7  CL 110 111 109 111 111 107 106  CO2 20* 19* 17* 17* 19* 20* 22  GLUCOSE 122* 105* 117* 156* 133* 96 90  BUN 25* 20 16 20  22* 20 19  CREATININE 0.98 0.86 0.59* 0.74 0.71 0.62 0.66  CALCIUM 8.1* 7.8* 8.1* 8.1* 8.7* 8.7* 8.6*  MG 1.2* 2.2 1.8 2.0  --   --   --   PHOS  2.1*  --   --   --   --   --   --    Liver Function Tests:  Recent Labs Lab 09/05/15 1935 09/06/15 0200  AST 47* 39  ALT 24 19  ALKPHOS 91 71  BILITOT 1.9* 1.7*  PROT 7.6 6.3*  ALBUMIN 4.2 3.5    Recent Labs Lab 09/05/15 1935  LIPASE 25   No results for input(s): AMMONIA in the last 168 hours. CBC:  Recent Labs Lab 09/05/15 1935  09/08/15 1001 09/09/15 0537 09/10/15 0515  09/11/15 0517 09/12/15 0526  WBC 8.9  < > 6.2 5.2 8.1 8.7 7.3  NEUTROABS 8.2*  --   --   --   --   --   --   HGB 12.5*  < > 9.5* 9.7* 10.2* 10.2* 8.9*  HCT 34.6*  < > 26.1* 27.3* 29.0* 28.9* 24.8*  MCV 93.3  < > 92.6 93.5 94.2 94.1 93.2  PLT 161  < > 121* 131* 161 172 144*  < > = values in this interval not displayed. Cardiac Enzymes:  Recent Labs Lab 09/06/15 0200 09/06/15 0657 09/06/15 1350  TROPONINI 0.10* 0.20* 0.24*   SIGNED: Time coordinating discharge: 30 minutes  Faye Ramsay, MD  [facility] 09/12/2015, 2:36 PM Pager [phone]  If 7PM-7AM, please contact night-coverage [email or web address] Password [email or web address]

## 2015-09-12 NOTE — Care Management Note (Signed)
Case Management Note  Patient Details  Name: Kurt Elliott MRN: [id number] Date of Birth: 1920-10-29  Subjective/Objective:  Family agreed to neb machine going to patient's home tomorrow-[facility] will have neb machine to be delivered to home tomorrow-family agreed-agree to pay$68 if not covered by insurance.                  Action/Plan:d/c home w/HHC.   Expected Discharge Date:   (unknown)               Expected Discharge Plan:  [place]  In-House Referral:     Discharge planning Services  CM Consult  Post Acute Care Choice:    Choice offered to:  Patient  DME Arranged:  Nebulizer machine DME Agency:  [place]:  PT [facility] Agency:  [place]  Status of Service:  Completed, signed off  Medicare Important Message Given:  Yes Date Medicare IM Given:    Medicare IM give by:    Date Additional Medicare IM Given:    Additional Medicare Important Message give by:     If discussed at [place] of Stay Meetings, dates discussed:    Additional Comments:  Dessa Phi, RN 09/12/2015, 4:29 PM

## 2015-09-12 NOTE — Care Management Note (Signed)
Case Management Note  Patient Details  Name: Kurt Elliott MRN: [id number] Date of Birth: 1920-07-31  Subjective/Objective:  [facility] rep Santiago Glad aware of d/c HHPT, [facility] dme rep Pura Spice aware of neb machine order.                  Action/Plan:d/c home w/HHC/DME   Expected Discharge Date:   (unknown)               Expected Discharge Plan:  [place]  In-House Referral:     Discharge planning Services  CM Consult  Post Acute Care Choice:    Choice offered to:  Patient  DME Arranged:  Nebulizer machine DME Agency:  [place]:  PT [facility] Agency:  [place]  Status of Service:  Completed, signed off  Medicare Important Message Given:  Yes Date Medicare IM Given:    Medicare IM give by:    Date Additional Medicare IM Given:    Additional Medicare Important Message give by:     If discussed at [place] of Stay Meetings, dates discussed:    Additional Comments:  Dessa Phi, RN 09/12/2015, 3:02 PM

## 2015-09-12 NOTE — Discharge Instructions (Signed)
Upper Respiratory Infection, Adult Most upper respiratory infections (URIs) are a viral infection of the air passages leading to the lungs. A URI affects the nose, throat, and upper air passages. The most common type of URI is nasopharyngitis and is typically referred to as "the common cold." URIs run their course and usually go away on their own. Most of the time, a URI does not require medical attention, but sometimes a bacterial infection in the upper airways can follow a viral infection. This is called a secondary infection. Sinus and middle ear infections are common types of secondary upper respiratory infections. Bacterial pneumonia can also complicate a URI. A URI can worsen asthma and chronic obstructive pulmonary disease (COPD). Sometimes, these complications can require emergency medical care and may be life threatening.  CAUSES Almost all URIs are caused by viruses. A virus is a type of germ and can spread from one person to another.  RISKS FACTORS You may be at risk for a URI if:   You smoke.   You have chronic heart or lung disease.  You have a weakened defense (immune) system.   You are very young or very old.   You have nasal allergies or asthma.  You work in crowded or poorly ventilated areas.  You work in health care facilities or schools. SIGNS AND SYMPTOMS  Symptoms typically develop 2-3 days after you come in contact with a cold virus. Most viral URIs last 7-10 days. However, viral URIs from the influenza virus (flu virus) can last 14-18 days and are typically more severe. Symptoms may include:   Runny or stuffy (congested) nose.   Sneezing.   Cough.   Sore throat.   Headache.   Fatigue.   Fever.   Loss of appetite.   Pain in your forehead, behind your eyes, and over your cheekbones (sinus pain).  Muscle aches.  DIAGNOSIS  Your health care provider may diagnose a URI by:  Physical exam.  Tests to check that your symptoms are not due to  another condition such as:  Strep throat.  Sinusitis.  Pneumonia.  Asthma. TREATMENT  A URI goes away on its own with time. It cannot be cured with medicines, but medicines may be prescribed or recommended to relieve symptoms. Medicines may help:  Reduce your fever.  Reduce your cough.  Relieve nasal congestion. HOME CARE INSTRUCTIONS   Take medicines only as directed by your health care provider.   Gargle warm saltwater or take cough drops to comfort your throat as directed by your health care provider.  Use a warm mist humidifier or inhale steam from a shower to increase air moisture. This may make it easier to breathe.  Drink enough fluid to keep your urine clear or pale yellow.   Eat soups and other clear broths and maintain good nutrition.   Rest as needed.   Return to work when your temperature has returned to normal or as your health care provider advises. You may need to stay home longer to avoid infecting others. You can also use a face mask and careful hand washing to prevent spread of the virus.  Increase the usage of your inhaler if you have asthma.   Do not use any tobacco products, including cigarettes, chewing tobacco, or electronic cigarettes. If you need help quitting, ask your health care provider. PREVENTION  The best way to protect yourself from getting a cold is to practice good hygiene.   Avoid oral or hand contact with people with cold   symptoms.   Wash your hands often if contact occurs.  There is no clear evidence that vitamin C, vitamin E, echinacea, or exercise reduces the chance of developing a cold. However, it is always recommended to get plenty of rest, exercise, and practice good nutrition.  SEEK MEDICAL CARE IF:   You are getting worse rather than better.   Your symptoms are not controlled by medicine.   You have chills.  You have worsening shortness of breath.  You have brown or red mucus.  You have yellow or brown nasal  discharge.  You have pain in your face, especially when you bend forward.  You have a fever.  You have swollen neck glands.  You have pain while swallowing.  You have white areas in the back of your throat. SEEK IMMEDIATE MEDICAL CARE IF:   You have severe or persistent:  Headache.  Ear pain.  Sinus pain.  Chest pain.  You have chronic lung disease and any of the following:  Wheezing.  Prolonged cough.  Coughing up blood.  A change in your usual mucus.  You have a stiff neck.  You have changes in your:  Vision.  Hearing.  Thinking.  Mood. MAKE SURE YOU:   Understand these instructions.  Will watch your condition.  Will get help right away if you are not doing well or get worse.   This information is not intended to replace advice given to you by your health care provider. Make sure you discuss any questions you have with your health care provider.   Document Released: 01/05/2001 Document Revised: 11/26/2014 Document Reviewed: 10/17/2013 Elsevier Interactive Patient Education 2016 Elsevier Inc.  

## 2015-09-13 NOTE — Progress Notes (Signed)
[facility] is providing the following services: Nebulizer (to be delivered to home today)  If patient discharges after hours, please call [phone].   Linward Headland 09/13/2015, 8:19 AM

## 2015-09-14 DIAGNOSIS — N183 Chronic kidney disease, stage 3 (moderate): Secondary | ICD-10-CM | POA: Diagnosis not present

## 2015-09-14 DIAGNOSIS — K219 Gastro-esophageal reflux disease without esophagitis: Secondary | ICD-10-CM | POA: Diagnosis not present

## 2015-09-14 DIAGNOSIS — I2510 Atherosclerotic heart disease of native coronary artery without angina pectoris: Secondary | ICD-10-CM | POA: Diagnosis not present

## 2015-09-14 DIAGNOSIS — M4800 Spinal stenosis, site unspecified: Secondary | ICD-10-CM | POA: Diagnosis not present

## 2015-09-14 DIAGNOSIS — Z8701 Personal history of pneumonia (recurrent): Secondary | ICD-10-CM | POA: Diagnosis not present

## 2015-09-14 DIAGNOSIS — I129 Hypertensive chronic kidney disease with stage 1 through stage 4 chronic kidney disease, or unspecified chronic kidney disease: Secondary | ICD-10-CM | POA: Diagnosis not present

## 2015-09-14 DIAGNOSIS — Z8546 Personal history of malignant neoplasm of prostate: Secondary | ICD-10-CM | POA: Diagnosis not present

## 2015-09-14 DIAGNOSIS — E785 Hyperlipidemia, unspecified: Secondary | ICD-10-CM | POA: Diagnosis not present

## 2015-09-14 DIAGNOSIS — L89152 Pressure ulcer of sacral region, stage 2: Secondary | ICD-10-CM | POA: Diagnosis not present

## 2015-09-15 ENCOUNTER — Telehealth: Payer: Self-pay | Admitting: *Deleted

## 2015-09-15 ENCOUNTER — Telehealth: Payer: Self-pay | Admitting: Endocrinology

## 2015-09-15 ENCOUNTER — Telehealth: Payer: Self-pay | Admitting: Oncology

## 2015-09-15 NOTE — Telephone Encounter (Signed)
ok 

## 2015-09-15 NOTE — Telephone Encounter (Signed)
See note below and please advise, Thanks! 

## 2015-09-15 NOTE — Telephone Encounter (Signed)
Message left on RN VM stating "  This is is daughter ,we need to cancel appointment for injection for Su Ley per his son Dr Ernestine Conrad until he is over this recent hospitalization ".  Return call number given as [phone].  Daughter's name not left.  Per [facility] noted pt was inpt from 2/11-2/17 2017 and discharged with home health.  Noted pt receives lupron per this office with last dose given in November 2016.  Request will be sent to reschedule lab and injection.

## 2015-09-15 NOTE — Telephone Encounter (Signed)
I contacted [facility] and advised of verbal ok.

## 2015-09-15 NOTE — Telephone Encounter (Signed)
Spoke with patient daughter [name] to try and r/s patient's lab/inj appt. Due to pt just getting d/c from hospital. They did not want to r/s at this moment. They will follow up with MD/nurse once pt gets his strength back

## 2015-09-15 NOTE — Telephone Encounter (Signed)
[facility] needs Verbal orders for OT eval and nursing consult for disease process management and med teaching # [phone] [place]

## 2015-09-16 ENCOUNTER — Ambulatory Visit: Payer: Medicare Other

## 2015-09-16 ENCOUNTER — Ambulatory Visit: Payer: Medicare Other | Admitting: Endocrinology

## 2015-09-16 ENCOUNTER — Other Ambulatory Visit: Payer: Self-pay | Admitting: Oncology

## 2015-09-16 ENCOUNTER — Telehealth: Payer: Self-pay | Admitting: Endocrinology

## 2015-09-16 NOTE — Telephone Encounter (Signed)
See note below and please advise if you would like me to contact the pt.

## 2015-09-16 NOTE — Telephone Encounter (Signed)
Pt's son called requesting to speak to Dr. Loanne Drilling or his nurse.  He said it is regarding his appointment he won't be able to make today. [phone]

## 2015-09-16 NOTE — Telephone Encounter (Signed)
i called. Ok to delay f/u appt until next week

## 2015-09-17 ENCOUNTER — Telehealth: Payer: Self-pay | Admitting: Endocrinology

## 2015-09-17 DIAGNOSIS — I2510 Atherosclerotic heart disease of native coronary artery without angina pectoris: Secondary | ICD-10-CM | POA: Diagnosis not present

## 2015-09-17 DIAGNOSIS — Z8701 Personal history of pneumonia (recurrent): Secondary | ICD-10-CM | POA: Diagnosis not present

## 2015-09-17 DIAGNOSIS — N183 Chronic kidney disease, stage 3 (moderate): Secondary | ICD-10-CM | POA: Diagnosis not present

## 2015-09-17 DIAGNOSIS — I129 Hypertensive chronic kidney disease with stage 1 through stage 4 chronic kidney disease, or unspecified chronic kidney disease: Secondary | ICD-10-CM | POA: Diagnosis not present

## 2015-09-17 DIAGNOSIS — L89152 Pressure ulcer of sacral region, stage 2: Secondary | ICD-10-CM | POA: Diagnosis not present

## 2015-09-17 DIAGNOSIS — M4800 Spinal stenosis, site unspecified: Secondary | ICD-10-CM | POA: Diagnosis not present

## 2015-09-17 NOTE — Telephone Encounter (Signed)
Please let me know what I need to do

## 2015-09-17 NOTE — Telephone Encounter (Signed)
See note below and please advise, Thanks! 

## 2015-09-17 NOTE — Telephone Encounter (Signed)
Kurt Elliott from [facility] stated that patient had a place on his sacral it was a stage 2 pressure ulcer, that wasn't on the order. Please advise

## 2015-09-18 DIAGNOSIS — I2510 Atherosclerotic heart disease of native coronary artery without angina pectoris: Secondary | ICD-10-CM | POA: Diagnosis not present

## 2015-09-18 DIAGNOSIS — M4800 Spinal stenosis, site unspecified: Secondary | ICD-10-CM | POA: Diagnosis not present

## 2015-09-18 DIAGNOSIS — Z8701 Personal history of pneumonia (recurrent): Secondary | ICD-10-CM | POA: Diagnosis not present

## 2015-09-18 DIAGNOSIS — I129 Hypertensive chronic kidney disease with stage 1 through stage 4 chronic kidney disease, or unspecified chronic kidney disease: Secondary | ICD-10-CM | POA: Diagnosis not present

## 2015-09-18 DIAGNOSIS — N183 Chronic kidney disease, stage 3 (moderate): Secondary | ICD-10-CM | POA: Diagnosis not present

## 2015-09-18 DIAGNOSIS — L89152 Pressure ulcer of sacral region, stage 2: Secondary | ICD-10-CM | POA: Diagnosis not present

## 2015-09-19 DIAGNOSIS — I129 Hypertensive chronic kidney disease with stage 1 through stage 4 chronic kidney disease, or unspecified chronic kidney disease: Secondary | ICD-10-CM | POA: Diagnosis not present

## 2015-09-19 DIAGNOSIS — L89152 Pressure ulcer of sacral region, stage 2: Secondary | ICD-10-CM | POA: Diagnosis not present

## 2015-09-19 DIAGNOSIS — Z8701 Personal history of pneumonia (recurrent): Secondary | ICD-10-CM | POA: Diagnosis not present

## 2015-09-19 DIAGNOSIS — N183 Chronic kidney disease, stage 3 (moderate): Secondary | ICD-10-CM | POA: Diagnosis not present

## 2015-09-19 DIAGNOSIS — M4800 Spinal stenosis, site unspecified: Secondary | ICD-10-CM | POA: Diagnosis not present

## 2015-09-19 DIAGNOSIS — I2510 Atherosclerotic heart disease of native coronary artery without angina pectoris: Secondary | ICD-10-CM | POA: Diagnosis not present

## 2015-09-22 ENCOUNTER — Ambulatory Visit (INDEPENDENT_AMBULATORY_CARE_PROVIDER_SITE_OTHER)
Admission: RE | Admit: 2015-09-22 | Discharge: 2015-09-22 | Disposition: A | Discharge status: Home / Self Care | Payer: Medicare Other | Source: Ambulatory Visit | Attending: Endocrinology | Admitting: Endocrinology

## 2015-09-22 ENCOUNTER — Telehealth: Payer: Self-pay

## 2015-09-22 ENCOUNTER — Ambulatory Visit (INDEPENDENT_AMBULATORY_CARE_PROVIDER_SITE_OTHER): Payer: Medicare Other | Admitting: Endocrinology

## 2015-09-22 VITALS — BP 126/64 | HR 70 | Temp 97.5°F

## 2015-09-22 DIAGNOSIS — J181 Lobar pneumonia, unspecified organism: Principal | ICD-10-CM

## 2015-09-22 DIAGNOSIS — N183 Chronic kidney disease, stage 3 unspecified: Secondary | ICD-10-CM

## 2015-09-22 DIAGNOSIS — D649 Anemia, unspecified: Secondary | ICD-10-CM

## 2015-09-22 DIAGNOSIS — M25559 Pain in unspecified hip: Secondary | ICD-10-CM

## 2015-09-22 DIAGNOSIS — J189 Pneumonia, unspecified organism: Secondary | ICD-10-CM

## 2015-09-22 DIAGNOSIS — L89151 Pressure ulcer of sacral region, stage 1: Secondary | ICD-10-CM

## 2015-09-22 DIAGNOSIS — I7090 Unspecified atherosclerosis: Secondary | ICD-10-CM | POA: Diagnosis not present

## 2015-09-22 DIAGNOSIS — M25552 Pain in left hip: Secondary | ICD-10-CM | POA: Diagnosis not present

## 2015-09-22 DIAGNOSIS — M25551 Pain in right hip: Secondary | ICD-10-CM | POA: Diagnosis not present

## 2015-09-22 DIAGNOSIS — L89159 Pressure ulcer of sacral region, unspecified stage: Secondary | ICD-10-CM | POA: Insufficient documentation

## 2015-09-22 LAB — CBC WITH DIFFERENTIAL/PLATELET
BASOS PCT: 0.3 % (ref 0.0–3.0)
Basophils Absolute: 0 10*3/uL (ref 0.0–0.1)
EOS ABS: 0.2 10*3/uL (ref 0.0–0.7)
Eosinophils Relative: 2 % (ref 0.0–5.0)
HEMATOCRIT: 27 % — AB (ref 39.0–52.0)
Hemoglobin: 9.3 g/dL — ABNORMAL LOW (ref 13.0–17.0)
LYMPHS PCT: 17.1 % (ref 12.0–46.0)
Lymphs Abs: 1.3 10*3/uL (ref 0.7–4.0)
MCHC: 34.5 g/dL (ref 30.0–36.0)
MCV: 95 fl (ref 78.0–100.0)
MONOS PCT: 6.7 % (ref 3.0–12.0)
Monocytes Absolute: 0.5 10*3/uL (ref 0.1–1.0)
NEUTROS ABS: 5.7 10*3/uL (ref 1.4–7.7)
Neutrophils Relative %: 73.9 % (ref 43.0–77.0)
PLATELETS: 361 10*3/uL (ref 150.0–400.0)
RBC: 2.85 Mil/uL — ABNORMAL LOW (ref 4.22–5.81)
RDW: 14.3 % (ref 11.5–15.5)
WBC: 7.7 10*3/uL (ref 4.0–10.5)

## 2015-09-22 LAB — BASIC METABOLIC PANEL
BUN: 22 mg/dL (ref 6–23)
CO2: 25 meq/L (ref 19–32)
Calcium: 9.6 mg/dL (ref 8.4–10.5)
Chloride: 105 mEq/L (ref 96–112)
Creatinine, Ser: 0.94 mg/dL (ref 0.40–1.50)
GFR: 79.61 mL/min (ref >60.00)
GLUCOSE: 127 mg/dL — AB (ref 70–99)
POTASSIUM: 4.6 meq/L (ref 3.5–5.1)
SODIUM: 136 meq/L (ref 135–145)

## 2015-09-22 LAB — IBC PANEL
Iron: 50 ug/dL (ref 42–165)
Saturation Ratios: 16.6 % — ABNORMAL LOW (ref 20.0–50.0)
Transferrin: 215 mg/dL (ref 212.0–360.0)

## 2015-09-22 LAB — MAGNESIUM: Magnesium: 1.8 mg/dL (ref 1.5–2.5)

## 2015-09-22 LAB — VITAMIN D 25 HYDROXY (VIT D DEFICIENCY, FRACTURES): VITD: 20.18 ng/mL — ABNORMAL LOW (ref 30.00–100.00)

## 2015-09-22 MED ORDER — HYDROCODONE-ACETAMINOPHEN 5-325 MG PO TABS: 1.0000 | ORAL_TABLET | ORAL | Status: DC | PRN

## 2015-09-22 NOTE — Patient Instructions (Addendum)
blood tests and x-rays are requested for you today.  We'll let you know about the results.   Please hold off on the lisinopril for now, but please continue the other BP meds.   Please see a wound care specialist.  you will receive a phone call, about a day and time for an appointment.

## 2015-09-22 NOTE — Telephone Encounter (Signed)
Pt is coming in on 09/22/2015 to be evaluated for pressure sores. The message from [facility] is correct, we do not have a contact number to verify we have received their message.

## 2015-09-22 NOTE — Progress Notes (Signed)
Subjective:    Patient ID: Kurt Elliott, male    DOB: [birth year], 82 y.o.   MRN: [id number]  HPI  The state of at least three ongoing medical problems is addressed today, with interval history of each noted here: Pt fell 2 days ago.  Since then, he has moderate pain at the lower back.  No assoc dizziness.  He was recently in the hospital with pneumonia.  doe persists.  Past Medical History  Diagnosis Date  . Hypertension   . Hyperlipemia   . Unspecified disorder of liver   . Cellulitis, leg     left  . Pulmonary nodule, right     2008 / unchanged CT scan, July, 2009  . Adenocarcinoma of prostate ([place])   . Esophageal stricture   . Spinal stenosis   . Hyperglycemia   . Hyperthyroidism   . GERD (gastroesophageal reflux disease)   . Hx of colonic polyps   . Renal artery stenosis ([facility])     Mild  . Atheroma     no penetrating ulcer, Abdominal aorta  . Inferior mesenteric artery injury     High-grade stenosis, CT scan, 2006  . Vertigo   . Diverticulum   . Incisional hernia   . Rash     Ticlid  . Cough     ACE Inhibitor, tolerates ARB  . Carotid artery disease ([place])     Doppler, 2006, minimal disease  //  Doppler, May, 2012, mild increase velocities, 40-59% bilateral stenoses  . Renal cyst     Hyperdense cyst lower pole left kidney  . CAD (coronary artery disease), native coronary artery     a. cath 2008  b. cath 10/21/2014 3v dzs w/ patent RCA stent, severe stenosis of mid LCx/OM1 s/p DES, moderately severe diffuse mid LAD treated with DES, patent prox LAD stent;  c. 01/2015 Cath: patent LAD, LCX, OM, and RCA stents->Med Rx.  . Mild aortic stenosis     a. 09/2014 Echo: EF 65-70%, Gr 1 DD, no rwma, mild AS;  b. 01/2015 Echo: EF 65-70%, mod LVH, no rwma, 85mmHg intracavitary gradient, mildly dil LA.    Past Surgical History  Procedure Laterality Date  . Esophagogastroduodenoscopy    . Cardiac catheterization    . Incisional hernia repair    . Left heart catheterization  with coronary angiogram N/A 10/21/2014    Procedure: LEFT HEART CATHETERIZATION WITH CORONARY ANGIOGRAM;  Surgeon: Sherren Mocha, MD;  Location[facility] C[facility] LAB;  Service: Cardiovascular;  Laterality: N/A;  . Percutaneous coronary stent intervention (pci-s)  10/21/2014    Procedure: PERCUTANEOUS CORONARY STENT INTERVENTION (PCI-S);  Surgeon: Sherren Mocha, MD;  Location: [facility] CATH LAB;  Service: Cardiovascular;;  Mid LAD and OM1  . Sigmoid resection / rectopexy    . Cardiac catheterization N/A 02/18/2015    Procedure: Right/Left Heart Cath and Coronary Angiography;  Surgeon: Leonie Man, MD;  Location: [place] CV LAB;  Service: Cardiovascular;  Laterality: N/A;    Social History   Social History  . Marital Status: Married    Spouse Name: N/A  . Number of Children: 4  . Years of Education: N/A   Occupational History  . 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 Smoking status: Never Smoker   . Smokeless tobacco: Never Used  . Alcohol Use: No  . Drug Use: No  . Sexual Activity: Not on file   Other Topics Concern  . Not on file   Social History Narrative  Current Outpatient Prescriptions on File Prior to Visit  Medication Sig Dispense Refill  . amLODipine (NORVASC) 5 MG tablet Take 1 tablet (5 mg total) by mouth daily. 30 tablet 1  . aspirin EC 81 MG tablet Take 81 mg by mouth daily.    . clopidogrel (PLAVIX) 75 MG tablet Take 1 tablet (75 mg total) by mouth daily with breakfast. 90 tablet 3  . fluticasone (FLONASE) 50 MCG/ACT nasal spray Place 2 sprays into both nostrils daily. 16 g 3  . guaiFENesin (MUCINEX) 600 MG 12 hr tablet Take 2 tablets (1,200 mg total) by mouth 2 (two) times daily. 60 tablet 1  . guaiFENesin-dextromethorphan (ROBITUSSIN DM) 100-10 MG/5ML syrup Take 5 mLs by mouth every 4 (four) hours as needed for cough. 118 mL 3  . hyoscyamine (LEVSIN) 0.125 MG tablet Take 1 tablet (0.125 mg total) by mouth at bedtime. 30 tablet 11  . Leuprolide  Acetate (LUPRON DEPOT IM) Inject into the muscle as directed. Done at [place] office approx once every 3 months    . LORazepam (ATIVAN) 0.5 MG tablet Take 1 tablet (0.5 mg total) by mouth 2 (two) times daily as needed for anxiety. 60 tablet 0  . methimazole (TAPAZOLE) 5 MG tablet TAKE 1 TABLET BY MOUTH 3 TIMES A WEEK (MON, WED, AND FRI) 45 tablet 3  . metoprolol tartrate (LOPRESSOR) 25 MG tablet Take 0.5 tablets (12.5 mg total) by mouth 2 (two) times daily. 90 tablet 3  . nitroGLYCERIN (NITROSTAT) 0.4 MG SL tablet Place 0.4 mg under the tongue every 5 (five) minutes as needed for chest pain.    . pantoprazole (PROTONIX) 40 MG tablet TAKE 1 TABLET (40 MG TOTAL) BY MOUTH DAILY. 90 tablet 2  . traZODone (DESYREL) 50 MG tablet Take 0.5 tablets (25 mg total) by mouth at bedtime as needed for sleep. 30 tablet 3   No current facility-administered medications on file prior to visit.    Allergies  Allergen Reactions  . Isosorbide Other (See Comments)    "severe Headache" per patient  . Lisinopril Cough  . Ticlopidine Hcl Rash    Family History  Problem Relation Age of Onset  . Breast cancer Sister     2 sisters  . Pemphigus vulgaris Mother     died at 35  . Heart attack Father   . Hypertension Mother   . Hypertension Father   . Hypertension Sister   . Hypertension Brother   . Stroke Father     BP 126/64 mmHg  Pulse 70  Temp(Src) 97.5 F (36.4 C) (Oral)  SpO2 99%  Review of Systems He has generalized weakness.  He says this is not caused by pain.  No LOC    Objective:   Physical Exam VITAL SIGNS:  See vs page GENERAL: no distress.  In wheelchair LUNGS:  Clear to auscultation.  Lower back/coccyx: nontender.   Upper buttocks: bilateral stage-1 skin breakdown.  Right leg: 2x3 cm superficial abrasion.     Lab Results  Component Value Date   CREATININE 0.94 09/22/2015   BUN 22 09/22/2015   NA 136 09/22/2015   K 4.6 09/22/2015   CL 105 09/22/2015   CO2 25 09/22/2015    Lab Results  Component Value Date   WBC 7.7 09/22/2015   HGB 9.3* 09/22/2015   HCT 27.0* 09/22/2015   MCV 95.0 09/22/2015   PLT 361.0 09/22/2015   25-OH vit-D=19     Assessment & Plan:  Decubitus ulcer, new. Abrasion: I advised pt  to keep covered with antibiotic and ointment and a large bandaid.   Vit-D deficiency, new. resp failure: needs f/u CXR.   Generalized weakness: he may do better with decreased BP rx.     Patient is advised the following: Patient Instructions  blood tests and x-rays are requested for you today.  We'll let you know about the results.   Please hold off on the lisinopril for now, but please continue the other BP meds.   Please see a wound care specialist.  you will receive a phone call, about a day and time for an appointment.     addendum: take non-prescription vitamin-D, 2000 units per day. Also, the anemia is only slightly better, and the iron is low. Take an iron pill, twice a day. Another option is to get intravenous iron, but the iron pill is easier for you.

## 2015-09-22 NOTE — Telephone Encounter (Signed)
This message was entered in error.

## 2015-09-23 DIAGNOSIS — L89152 Pressure ulcer of sacral region, stage 2: Secondary | ICD-10-CM | POA: Diagnosis not present

## 2015-09-23 DIAGNOSIS — N183 Chronic kidney disease, stage 3 (moderate): Secondary | ICD-10-CM | POA: Diagnosis not present

## 2015-09-23 DIAGNOSIS — I129 Hypertensive chronic kidney disease with stage 1 through stage 4 chronic kidney disease, or unspecified chronic kidney disease: Secondary | ICD-10-CM | POA: Diagnosis not present

## 2015-09-23 DIAGNOSIS — I2510 Atherosclerotic heart disease of native coronary artery without angina pectoris: Secondary | ICD-10-CM | POA: Diagnosis not present

## 2015-09-23 DIAGNOSIS — Z8701 Personal history of pneumonia (recurrent): Secondary | ICD-10-CM | POA: Diagnosis not present

## 2015-09-23 DIAGNOSIS — M4800 Spinal stenosis, site unspecified: Secondary | ICD-10-CM | POA: Diagnosis not present

## 2015-09-23 LAB — PTH, INTACT AND CALCIUM
Calcium: 9.1 mg/dL (ref 8.4–10.5)
PTH: 60 pg/mL (ref 14–64)

## 2015-09-24 ENCOUNTER — Telehealth: Payer: Self-pay | Admitting: Endocrinology

## 2015-09-24 DIAGNOSIS — I2510 Atherosclerotic heart disease of native coronary artery without angina pectoris: Secondary | ICD-10-CM | POA: Diagnosis not present

## 2015-09-24 DIAGNOSIS — N183 Chronic kidney disease, stage 3 (moderate): Secondary | ICD-10-CM | POA: Diagnosis not present

## 2015-09-24 DIAGNOSIS — I129 Hypertensive chronic kidney disease with stage 1 through stage 4 chronic kidney disease, or unspecified chronic kidney disease: Secondary | ICD-10-CM | POA: Diagnosis not present

## 2015-09-24 DIAGNOSIS — Z8701 Personal history of pneumonia (recurrent): Secondary | ICD-10-CM | POA: Diagnosis not present

## 2015-09-24 DIAGNOSIS — L89152 Pressure ulcer of sacral region, stage 2: Secondary | ICD-10-CM | POA: Diagnosis not present

## 2015-09-24 DIAGNOSIS — M4800 Spinal stenosis, site unspecified: Secondary | ICD-10-CM | POA: Diagnosis not present

## 2015-09-24 NOTE — Telephone Encounter (Signed)
Daughter is calling for info on pt results please Colletta [place] read the note to from Dr. Loanne Drilling  Daughter has further questions

## 2015-09-24 NOTE — Telephone Encounter (Signed)
Update: Pt's son called, and we discussed. Plan is to ask hematol about IV-Fe, then recheck h/h, then do epogen if necessary The other option is transfusion.   i'll ask

## 2015-09-24 NOTE — Telephone Encounter (Signed)
I contacted the pt's daughter. She is concerned about the pt taking the iron medicaiton because he becomes constipated and once this happens the pt becomes dehydrated beceause he has to take mirlax to help with the constipation. Mrs. Deatra Ina would like for you to call her at [phone].

## 2015-09-24 NOTE — Telephone Encounter (Signed)
Ok, do you want [place] to schedule intravenous iron?

## 2015-09-24 NOTE — Telephone Encounter (Signed)
She stated she wanted to talk with you before making the decision to do the intravenous iron infusion for the pt.

## 2015-09-25 ENCOUNTER — Telehealth: Payer: Self-pay | Admitting: Oncology

## 2015-09-25 ENCOUNTER — Other Ambulatory Visit: Payer: Self-pay | Admitting: Oncology

## 2015-09-25 ENCOUNTER — Telehealth: Payer: Self-pay | Admitting: Endocrinology

## 2015-09-25 DIAGNOSIS — I129 Hypertensive chronic kidney disease with stage 1 through stage 4 chronic kidney disease, or unspecified chronic kidney disease: Secondary | ICD-10-CM | POA: Diagnosis not present

## 2015-09-25 DIAGNOSIS — I2510 Atherosclerotic heart disease of native coronary artery without angina pectoris: Secondary | ICD-10-CM | POA: Diagnosis not present

## 2015-09-25 DIAGNOSIS — C61 Malignant neoplasm of prostate: Secondary | ICD-10-CM

## 2015-09-25 DIAGNOSIS — Z8701 Personal history of pneumonia (recurrent): Secondary | ICD-10-CM | POA: Diagnosis not present

## 2015-09-25 DIAGNOSIS — L89152 Pressure ulcer of sacral region, stage 2: Secondary | ICD-10-CM | POA: Diagnosis not present

## 2015-09-25 DIAGNOSIS — N183 Chronic kidney disease, stage 3 (moderate): Secondary | ICD-10-CM | POA: Diagnosis not present

## 2015-09-25 DIAGNOSIS — M4800 Spinal stenosis, site unspecified: Secondary | ICD-10-CM | POA: Diagnosis not present

## 2015-09-25 NOTE — Telephone Encounter (Signed)
cld & spoke to pt and adv of lab order per Dr Benard Halsted stated just had labs drawn by Dr Louretta Parma it may be different drawns pt refused to make appt-trans to [facility] ext [phone]

## 2015-09-25 NOTE — Telephone Encounter (Addendum)
Call received from patient's daughter [name] reporting "he is too weak to come in for lab.  We only want Dr. Virgie Dad opinion if he thinks dad needs a transfusion because he is not perking up.  He only eats a little.  He's too weak to go from the chair to touch the walker.  Using PT, OT.  He can check labs from Dr. Loanne Drilling or call my brother Gaspar Bidding in [place]."  Call transferred to colaborative.

## 2015-09-25 NOTE — Telephone Encounter (Signed)
Pt is still very weak, and the oncologist was going to "look into it" but she doesn't think it can wait.  Pt's daughter wanted to tell you that she thinks he needs a transfusion.

## 2015-09-26 DIAGNOSIS — Z8701 Personal history of pneumonia (recurrent): Secondary | ICD-10-CM | POA: Diagnosis not present

## 2015-09-26 DIAGNOSIS — N183 Chronic kidney disease, stage 3 (moderate): Secondary | ICD-10-CM | POA: Diagnosis not present

## 2015-09-26 DIAGNOSIS — L89152 Pressure ulcer of sacral region, stage 2: Secondary | ICD-10-CM | POA: Diagnosis not present

## 2015-09-26 DIAGNOSIS — I129 Hypertensive chronic kidney disease with stage 1 through stage 4 chronic kidney disease, or unspecified chronic kidney disease: Secondary | ICD-10-CM | POA: Diagnosis not present

## 2015-09-26 DIAGNOSIS — I2510 Atherosclerotic heart disease of native coronary artery without angina pectoris: Secondary | ICD-10-CM | POA: Diagnosis not present

## 2015-09-26 DIAGNOSIS — M4800 Spinal stenosis, site unspecified: Secondary | ICD-10-CM | POA: Diagnosis not present

## 2015-09-26 NOTE — Telephone Encounter (Signed)
See note below and please advise, Thanks! 

## 2015-09-26 NOTE — Telephone Encounter (Signed)
Please understand this is really a matter for the hematologist. I don't have expertise in this area.

## 2015-09-26 NOTE — Telephone Encounter (Signed)
Pt's daughter advised of note below and voiced understanding.  

## 2015-09-29 ENCOUNTER — Telehealth: Payer: Self-pay | Admitting: *Deleted

## 2015-09-29 DIAGNOSIS — I2510 Atherosclerotic heart disease of native coronary artery without angina pectoris: Secondary | ICD-10-CM | POA: Diagnosis not present

## 2015-09-29 DIAGNOSIS — Z8701 Personal history of pneumonia (recurrent): Secondary | ICD-10-CM | POA: Diagnosis not present

## 2015-09-29 DIAGNOSIS — I129 Hypertensive chronic kidney disease with stage 1 through stage 4 chronic kidney disease, or unspecified chronic kidney disease: Secondary | ICD-10-CM | POA: Diagnosis not present

## 2015-09-29 DIAGNOSIS — L89152 Pressure ulcer of sacral region, stage 2: Secondary | ICD-10-CM | POA: Diagnosis not present

## 2015-09-29 DIAGNOSIS — M4800 Spinal stenosis, site unspecified: Secondary | ICD-10-CM | POA: Diagnosis not present

## 2015-09-29 DIAGNOSIS — N183 Chronic kidney disease, stage 3 (moderate): Secondary | ICD-10-CM | POA: Diagnosis not present

## 2015-09-29 NOTE — Telephone Encounter (Signed)
This RN called and spoke with pt's daughter per calls with concerns as well as noted communication with Dr Lissa Merlin' office.  Informed Sarii that MD would need labs not for testosterone but her concerns regarding low hemeglobin - and request for a blood transfusion.  This RN inquired if Mr Boehl could come in for lab.  Sarii states - " yes he is perking up " " he is sleeping and coming around "  This RN inquired about pt coming in for lab this week with her stating " no mom is right here with me and she said no "  " we just have a lot going on and he is seeming to perk up "  Plan is to follow up later this week with pt's status and arrange appropriate follow up.  No other needs at this time.

## 2015-09-30 DIAGNOSIS — I2510 Atherosclerotic heart disease of native coronary artery without angina pectoris: Secondary | ICD-10-CM | POA: Diagnosis not present

## 2015-09-30 DIAGNOSIS — L89152 Pressure ulcer of sacral region, stage 2: Secondary | ICD-10-CM | POA: Diagnosis not present

## 2015-09-30 DIAGNOSIS — Z8701 Personal history of pneumonia (recurrent): Secondary | ICD-10-CM | POA: Diagnosis not present

## 2015-09-30 DIAGNOSIS — N183 Chronic kidney disease, stage 3 (moderate): Secondary | ICD-10-CM | POA: Diagnosis not present

## 2015-09-30 DIAGNOSIS — M4800 Spinal stenosis, site unspecified: Secondary | ICD-10-CM | POA: Diagnosis not present

## 2015-09-30 DIAGNOSIS — I129 Hypertensive chronic kidney disease with stage 1 through stage 4 chronic kidney disease, or unspecified chronic kidney disease: Secondary | ICD-10-CM | POA: Diagnosis not present

## 2015-10-01 DIAGNOSIS — I129 Hypertensive chronic kidney disease with stage 1 through stage 4 chronic kidney disease, or unspecified chronic kidney disease: Secondary | ICD-10-CM | POA: Diagnosis not present

## 2015-10-01 DIAGNOSIS — M4800 Spinal stenosis, site unspecified: Secondary | ICD-10-CM | POA: Diagnosis not present

## 2015-10-01 DIAGNOSIS — Z8701 Personal history of pneumonia (recurrent): Secondary | ICD-10-CM | POA: Diagnosis not present

## 2015-10-01 DIAGNOSIS — N183 Chronic kidney disease, stage 3 (moderate): Secondary | ICD-10-CM | POA: Diagnosis not present

## 2015-10-01 DIAGNOSIS — L89152 Pressure ulcer of sacral region, stage 2: Secondary | ICD-10-CM | POA: Diagnosis not present

## 2015-10-01 DIAGNOSIS — I2510 Atherosclerotic heart disease of native coronary artery without angina pectoris: Secondary | ICD-10-CM | POA: Diagnosis not present

## 2015-10-02 ENCOUNTER — Telehealth: Payer: Self-pay

## 2015-10-02 NOTE — Telephone Encounter (Signed)
[facility] with [facility] called requesting verbal wound care orders.  Would care nurse is requesting the verbal ok: 1. Cleanse the coccyx wound with moisturizing spray, pat dry, place protective ointment and cover 3 times per day.  2. After any incontinence episode to apply protective ointment to the skin 3. The placement of 2 vaseline gauze on the right and left shin to be changed 2 times per week.  Please advise, Thanks!

## 2015-10-02 NOTE — Telephone Encounter (Signed)
Kurt Elliott with [facility] advised of verbal ok.

## 2015-10-02 NOTE — Telephone Encounter (Signed)
ok 

## 2015-10-03 DIAGNOSIS — I129 Hypertensive chronic kidney disease with stage 1 through stage 4 chronic kidney disease, or unspecified chronic kidney disease: Secondary | ICD-10-CM | POA: Diagnosis not present

## 2015-10-03 DIAGNOSIS — N183 Chronic kidney disease, stage 3 (moderate): Secondary | ICD-10-CM | POA: Diagnosis not present

## 2015-10-03 DIAGNOSIS — Z8701 Personal history of pneumonia (recurrent): Secondary | ICD-10-CM | POA: Diagnosis not present

## 2015-10-03 DIAGNOSIS — M4800 Spinal stenosis, site unspecified: Secondary | ICD-10-CM | POA: Diagnosis not present

## 2015-10-03 DIAGNOSIS — I2510 Atherosclerotic heart disease of native coronary artery without angina pectoris: Secondary | ICD-10-CM | POA: Diagnosis not present

## 2015-10-03 DIAGNOSIS — L89152 Pressure ulcer of sacral region, stage 2: Secondary | ICD-10-CM | POA: Diagnosis not present

## 2015-10-06 DIAGNOSIS — I129 Hypertensive chronic kidney disease with stage 1 through stage 4 chronic kidney disease, or unspecified chronic kidney disease: Secondary | ICD-10-CM | POA: Diagnosis not present

## 2015-10-06 DIAGNOSIS — N183 Chronic kidney disease, stage 3 (moderate): Secondary | ICD-10-CM | POA: Diagnosis not present

## 2015-10-06 DIAGNOSIS — I2510 Atherosclerotic heart disease of native coronary artery without angina pectoris: Secondary | ICD-10-CM | POA: Diagnosis not present

## 2015-10-06 DIAGNOSIS — L89152 Pressure ulcer of sacral region, stage 2: Secondary | ICD-10-CM | POA: Diagnosis not present

## 2015-10-06 DIAGNOSIS — Z8701 Personal history of pneumonia (recurrent): Secondary | ICD-10-CM | POA: Diagnosis not present

## 2015-10-06 DIAGNOSIS — M4800 Spinal stenosis, site unspecified: Secondary | ICD-10-CM | POA: Diagnosis not present

## 2015-10-08 DIAGNOSIS — L89152 Pressure ulcer of sacral region, stage 2: Secondary | ICD-10-CM | POA: Diagnosis not present

## 2015-10-08 DIAGNOSIS — I129 Hypertensive chronic kidney disease with stage 1 through stage 4 chronic kidney disease, or unspecified chronic kidney disease: Secondary | ICD-10-CM | POA: Diagnosis not present

## 2015-10-08 DIAGNOSIS — I2510 Atherosclerotic heart disease of native coronary artery without angina pectoris: Secondary | ICD-10-CM | POA: Diagnosis not present

## 2015-10-08 DIAGNOSIS — M4800 Spinal stenosis, site unspecified: Secondary | ICD-10-CM | POA: Diagnosis not present

## 2015-10-08 DIAGNOSIS — Z8701 Personal history of pneumonia (recurrent): Secondary | ICD-10-CM | POA: Diagnosis not present

## 2015-10-08 DIAGNOSIS — N183 Chronic kidney disease, stage 3 (moderate): Secondary | ICD-10-CM | POA: Diagnosis not present

## 2015-10-09 DIAGNOSIS — L89152 Pressure ulcer of sacral region, stage 2: Secondary | ICD-10-CM | POA: Diagnosis not present

## 2015-10-09 DIAGNOSIS — Z8701 Personal history of pneumonia (recurrent): Secondary | ICD-10-CM | POA: Diagnosis not present

## 2015-10-09 DIAGNOSIS — I2510 Atherosclerotic heart disease of native coronary artery without angina pectoris: Secondary | ICD-10-CM | POA: Diagnosis not present

## 2015-10-09 DIAGNOSIS — M4800 Spinal stenosis, site unspecified: Secondary | ICD-10-CM | POA: Diagnosis not present

## 2015-10-09 DIAGNOSIS — I129 Hypertensive chronic kidney disease with stage 1 through stage 4 chronic kidney disease, or unspecified chronic kidney disease: Secondary | ICD-10-CM | POA: Diagnosis not present

## 2015-10-09 DIAGNOSIS — N183 Chronic kidney disease, stage 3 (moderate): Secondary | ICD-10-CM | POA: Diagnosis not present

## 2015-10-14 DIAGNOSIS — L89152 Pressure ulcer of sacral region, stage 2: Secondary | ICD-10-CM | POA: Diagnosis not present

## 2015-10-14 DIAGNOSIS — M4800 Spinal stenosis, site unspecified: Secondary | ICD-10-CM | POA: Diagnosis not present

## 2015-10-14 DIAGNOSIS — Z8701 Personal history of pneumonia (recurrent): Secondary | ICD-10-CM | POA: Diagnosis not present

## 2015-10-14 DIAGNOSIS — I2510 Atherosclerotic heart disease of native coronary artery without angina pectoris: Secondary | ICD-10-CM | POA: Diagnosis not present

## 2015-10-14 DIAGNOSIS — I129 Hypertensive chronic kidney disease with stage 1 through stage 4 chronic kidney disease, or unspecified chronic kidney disease: Secondary | ICD-10-CM | POA: Diagnosis not present

## 2015-10-14 DIAGNOSIS — N183 Chronic kidney disease, stage 3 (moderate): Secondary | ICD-10-CM | POA: Diagnosis not present

## 2015-10-15 ENCOUNTER — Telehealth: Payer: Self-pay | Admitting: Endocrinology

## 2015-10-15 NOTE — Telephone Encounter (Signed)
See note below and please advise, Thanks! 

## 2015-10-15 NOTE — Telephone Encounter (Signed)
Left a vm advising of verbal ok.

## 2015-10-15 NOTE — Telephone Encounter (Signed)
ok 

## 2015-10-15 NOTE — Telephone Encounter (Signed)
Kurt Elliott, who is the patient's physical therapist called to ask if the orders can be extended to 2x a week for 2 more weeks and then 1x a week for 2 more weeks (4 weeks total)  CB# [phone]

## 2015-10-16 DIAGNOSIS — L89152 Pressure ulcer of sacral region, stage 2: Secondary | ICD-10-CM | POA: Diagnosis not present

## 2015-10-16 DIAGNOSIS — I129 Hypertensive chronic kidney disease with stage 1 through stage 4 chronic kidney disease, or unspecified chronic kidney disease: Secondary | ICD-10-CM | POA: Diagnosis not present

## 2015-10-16 DIAGNOSIS — N183 Chronic kidney disease, stage 3 (moderate): Secondary | ICD-10-CM | POA: Diagnosis not present

## 2015-10-16 DIAGNOSIS — Z8701 Personal history of pneumonia (recurrent): Secondary | ICD-10-CM | POA: Diagnosis not present

## 2015-10-16 DIAGNOSIS — I2510 Atherosclerotic heart disease of native coronary artery without angina pectoris: Secondary | ICD-10-CM | POA: Diagnosis not present

## 2015-10-16 DIAGNOSIS — M4800 Spinal stenosis, site unspecified: Secondary | ICD-10-CM | POA: Diagnosis not present

## 2015-10-20 ENCOUNTER — Other Ambulatory Visit: Payer: Self-pay | Admitting: Physician Assistant

## 2015-10-20 NOTE — Telephone Encounter (Signed)
Please review for refill. Thanks!  

## 2015-10-21 DIAGNOSIS — I129 Hypertensive chronic kidney disease with stage 1 through stage 4 chronic kidney disease, or unspecified chronic kidney disease: Secondary | ICD-10-CM | POA: Diagnosis not present

## 2015-10-21 DIAGNOSIS — L89152 Pressure ulcer of sacral region, stage 2: Secondary | ICD-10-CM | POA: Diagnosis not present

## 2015-10-21 DIAGNOSIS — M4800 Spinal stenosis, site unspecified: Secondary | ICD-10-CM | POA: Diagnosis not present

## 2015-10-21 DIAGNOSIS — I2510 Atherosclerotic heart disease of native coronary artery without angina pectoris: Secondary | ICD-10-CM | POA: Diagnosis not present

## 2015-10-21 DIAGNOSIS — N183 Chronic kidney disease, stage 3 (moderate): Secondary | ICD-10-CM | POA: Diagnosis not present

## 2015-10-21 DIAGNOSIS — Z8701 Personal history of pneumonia (recurrent): Secondary | ICD-10-CM | POA: Diagnosis not present

## 2015-10-23 DIAGNOSIS — N183 Chronic kidney disease, stage 3 (moderate): Secondary | ICD-10-CM | POA: Diagnosis not present

## 2015-10-23 DIAGNOSIS — I129 Hypertensive chronic kidney disease with stage 1 through stage 4 chronic kidney disease, or unspecified chronic kidney disease: Secondary | ICD-10-CM | POA: Diagnosis not present

## 2015-10-23 DIAGNOSIS — M4800 Spinal stenosis, site unspecified: Secondary | ICD-10-CM | POA: Diagnosis not present

## 2015-10-23 DIAGNOSIS — I2510 Atherosclerotic heart disease of native coronary artery without angina pectoris: Secondary | ICD-10-CM | POA: Diagnosis not present

## 2015-10-23 DIAGNOSIS — L89152 Pressure ulcer of sacral region, stage 2: Secondary | ICD-10-CM | POA: Diagnosis not present

## 2015-10-23 DIAGNOSIS — Z8701 Personal history of pneumonia (recurrent): Secondary | ICD-10-CM | POA: Diagnosis not present

## 2015-10-24 DIAGNOSIS — I129 Hypertensive chronic kidney disease with stage 1 through stage 4 chronic kidney disease, or unspecified chronic kidney disease: Secondary | ICD-10-CM | POA: Diagnosis not present

## 2015-10-24 DIAGNOSIS — L89152 Pressure ulcer of sacral region, stage 2: Secondary | ICD-10-CM | POA: Diagnosis not present

## 2015-10-24 DIAGNOSIS — M4800 Spinal stenosis, site unspecified: Secondary | ICD-10-CM | POA: Diagnosis not present

## 2015-10-24 DIAGNOSIS — Z8701 Personal history of pneumonia (recurrent): Secondary | ICD-10-CM | POA: Diagnosis not present

## 2015-10-24 DIAGNOSIS — N183 Chronic kidney disease, stage 3 (moderate): Secondary | ICD-10-CM | POA: Diagnosis not present

## 2015-10-24 DIAGNOSIS — I2510 Atherosclerotic heart disease of native coronary artery without angina pectoris: Secondary | ICD-10-CM | POA: Diagnosis not present

## 2015-10-27 ENCOUNTER — Encounter: Payer: Self-pay | Admitting: Cardiovascular Disease

## 2015-10-27 ENCOUNTER — Ambulatory Visit (INDEPENDENT_AMBULATORY_CARE_PROVIDER_SITE_OTHER): Payer: Medicare Other | Admitting: Cardiovascular Disease

## 2015-10-27 VITALS — BP 158/80 | HR 70 | Ht 70.0 in | Wt 157.1 lb

## 2015-10-27 DIAGNOSIS — I350 Nonrheumatic aortic (valve) stenosis: Secondary | ICD-10-CM | POA: Diagnosis not present

## 2015-10-27 DIAGNOSIS — I2510 Atherosclerotic heart disease of native coronary artery without angina pectoris: Secondary | ICD-10-CM

## 2015-10-27 MED ORDER — NITROGLYCERIN 0.4 MG SL SUBL: 0.4000 mg | SUBLINGUAL_TABLET | SUBLINGUAL | Status: AC | PRN

## 2015-10-27 NOTE — Patient Instructions (Signed)

## 2015-10-27 NOTE — Progress Notes (Signed)
Cardiology Office Note Date:  10/27/2015   ID:  Kurt Elliott, DOB 05/23/1921, MRN [id number]  PCP:  Renato Shin, MD  Cardiologist:  Sherren Mocha, MD    Chief Complaint  Patient presents with  . Coronary Artery Disease    denies cp, sob, claudication. c/o le edema off and on  . Aortic Stenosis    History of Present Illness: Kurt Elliott is a 82 y.o. male who presents for follow-up evaluation. He's previously been followed by Dr Ron Parker. This is his first visit with me, but I remember him from a previous PCI procedure in 2016 when he underwent stenting of the left circumflex and LAD.His other chronic medical problems include hypertension, hyperlipidemia, and mild aortic stenosis. The patient is here with his wife today. He seems to be doing okay. He denies chest pain or shortness of breath. He denies leg swelling. He still drives. He doesn't have any cardiac related complaints.   Past Medical History  Diagnosis Date  . Hypertension   . Hyperlipemia   . Unspecified disorder of liver   . Cellulitis, leg     left  . Pulmonary nodule, right     2008 / unchanged CT scan, July, 2009  . Adenocarcinoma of prostate ([place])   . Esophageal stricture   . Spinal stenosis   . Hyperglycemia   . Hyperthyroidism   . GERD (gastroesophageal reflux disease)   . Hx of colonic polyps   . Renal artery stenosis (HCC)     Mild  . Atheroma     no penetrating ulcer, Abdominal aorta  . Inferior mesenteric artery injury     High-grade stenosis, CT scan, 2006  . Vertigo   . Diverticulum   . Incisional hernia   . Rash     Ticlid  . Cough     ACE Inhibitor, tolerates ARB  . Carotid artery disease ([place])     Doppler, 2006, minimal disease  //  Doppler, May, 2012, mild increase velocities, 40-59% bilateral stenoses  . Renal cyst     Hyperdense cyst lower pole left kidney  . CAD (coronary artery disease), native coronary artery     a. cath 2008  b. cath 10/21/2014 3v dzs w/ patent RCA stent,  severe stenosis of mid LCx/OM1 s/p DES, moderately severe diffuse mid LAD treated with DES, patent prox LAD stent;  c. 01/2015 Cath: patent LAD, LCX, OM, and RCA stents->Med Rx.  . Mild aortic stenosis     a. 09/2014 Echo: EF 65-70%, Gr 1 DD, no rwma, mild AS;  b. 01/2015 Echo: EF 65-70%, mod LVH, no rwma, 61mmHg intracavitary gradient, mildly dil LA.    Past Surgical History  Procedure Laterality Date  . Esophagogastroduodenoscopy    . Cardiac catheterization    . Incisional hernia repair    . Left heart catheterization with coronary angiogram N/A 10/21/2014    Procedure: LEFT HEART CATHETERIZATION WITH CORONARY ANGIOGRAM;  Surgeon: Sherren Mocha, MD;  Location: [facility] CATH LAB;  Service: Cardiovascular;  Laterality: N/A;  . Percutaneous coronary stent intervention (pci-s)  10/21/2014    Procedure: PERCUTANEOUS CORONARY STENT INTERVENTION (PCI-S);  Surgeon: Sherren Mocha, MD;  Location: [facility] CATH LAB;  Service: Cardiovascular;;  Mid LAD and OM1  . Sigmoid resection / rectopexy    . Cardiac catheterization N/A 02/18/2015    Procedure: Right/Left Heart Cath and Coronary Angiography;  Surgeon: Leonie Man, MD;  Location: [facility] CV LAB;  Service: Cardiovascular;  Laterality: N/A;  Current Outpatient Prescriptions  Medication Sig Dispense Refill  . amLODipine (NORVASC) 5 MG tablet Take 1 tablet (5 mg total) by mouth daily. 30 tablet 1  . aspirin EC 81 MG tablet Take 81 mg by mouth daily.    . clopidogrel (PLAVIX) 75 MG tablet TAKE 1 TABLET (75 MG TOTAL) BY MOUTH DAILY WITH BREAKFAST. 90 tablet 2  . guaiFENesin-dextromethorphan (ROBITUSSIN DM) 100-10 MG/5ML syrup Take 5 mLs by mouth every 4 (four) hours as needed for cough. 118 mL 3  . Leuprolide Acetate (LUPRON DEPOT IM) Inject into the muscle as directed. Done at [place] office approx once every 3 months    . LORazepam (ATIVAN) 0.5 MG tablet Take 0.5 mg by mouth 2 (two) times daily as needed for anxiety.    . methimazole (TAPAZOLE) 5 MG  tablet Take 5 mg by mouth daily as needed (dizziness).    . metoprolol tartrate (LOPRESSOR) 25 MG tablet Take 0.5 tablets (12.5 mg total) by mouth 2 (two) times daily. 90 tablet 3  . nitroGLYCERIN (NITROSTAT) 0.4 MG SL tablet Place 0.4 mg under the tongue every 5 (five) minutes as needed for chest pain.    . pantoprazole (PROTONIX) 40 MG tablet TAKE 1 TABLET (40 MG TOTAL) BY MOUTH DAILY. 90 tablet 2   No current facility-administered medications for this visit.    Allergies:   Isosorbide nitrate; Lisinopril; and Ticlopidine hcl   Social History:  The patient  reports that he has never smoked. He has never used smokeless tobacco. He reports that he does not drink alcohol or use illicit drugs.   Family History:  The patient's family history includes Breast cancer in his sister; Heart attack in his father; Hypertension in his brother, father, mother, and sister; Pemphigus vulgaris in his mother; Stroke in his father.    ROS:  Please see the history of present illness.  Otherwise, review of systems is positive for Visual disturbance, back pain, rash, dizziness.  All other systems are reviewed and negative.    PHYSICAL EXAM: VS:  BP 158/80 mmHg  Pulse 70  Ht 5\' 10"  (1.778 m)  Wt 157 lb 1.9 oz (71.269 kg)  BMI 22.54 kg/m2 , BMI Body mass index is 22.54 kg/(m^2). GEN: Well nourished, well developed, pleasant elderly man in no acute distress HEENT: normal Neck: no JVD, no masses. No carotid bruits Cardiac: RRR with 2/6 systolic murmur at the RUSB           Respiratory:  clear to auscultation bilaterally, normal work of breathing GI: soft, nontender, nondistended, + BS MS: no deformity or atrophy Ext: no pretibial edema, pedal pulses 2+= bilaterally Skin: warm and dry, no rash Neuro:  Strength and sensation are intact Psych: euthymic mood, full affect  EKG:  EKG is ordered today. The ekg ordered today shows normal sinus rhythm 70 bpm, age indeterminate inferior MI, RV conduction  delay  Recent Labs: 09/05/2015: TSH 1.459 09/06/2015: ALT 19 09/22/2015: BUN 22; Creatinine, Ser 0.94; Hemoglobin 9.3*; Magnesium 1.8; Platelets 361.0; Potassium 4.6; Sodium 136   Lipid Panel     Component Value Date/Time   CHOL 149 02/18/2015 0416   TRIG 51 02/18/2015 0416   TRIG 137 05/23/2006 0817   HDL 43 02/18/2015 0416   CHOLHDL 3.5 02/18/2015 0416   CHOLHDL 3.6 CALC 05/23/2006 0817   VLDL 10 02/18/2015 0416   LDLCALC 96 02/18/2015 0416   LDLDIRECT 97.5 06/01/2011 1419      Wt Readings from Last 3 Encounters:  10/27/15 157  lb 1.9 oz (71.269 kg)  09/06/15 168 lb 10.4 oz (76.5 kg)  09/01/15 166 lb (75.297 kg)     Cardiac Studies Reviewed: Cath 02/18/2015: Conclusion    1. There is hyperdynamic left ventricular systolic function with near cavity obliteration. 3+ MR. 2. Widely patent stents in the proximal RCA, mid circumflex-OM and mid LAD with mild-moderate diffuse nonobstructive disease elsewhere. 3. Mild aortic stenosis  Final Conclusions:  Dense with no culprit lesion to explain the patient's symptoms. The jailed diagonal has stable moderate to severe stenosis in a very small caliber vessel. Extremely low central venous and capillary wedge pressures with hyperdynamic LV suggests dehydration.    Echo 02/18/2015: Study Conclusions  - Left ventricle: There is an intracavitary gradient of 21mm/Hg.  The cavity size was normal. Wall thickness was increased in a  pattern of moderate LVH. Systolic function was vigorous. The  estimated ejection fraction was in the range of 65% to 70%. Wall  motion was normal; there were no regional wall motion  abnormalities. - Aortic valve: Thickened aortic valve. Opening is seen. The mean  and peak gradients are only mildly elevated. However, the LV  cavity size is small with vigorous motion. Also, there is an  intracavitary gradient of 72mmHg. There is no mitral SAM. There  is no LVOT gradient. The patient will be cathed  today. All  hemodynamics will be obtained to rule out the slim chance of  NORMAL EF/LOW GRADIENT/SEVERE AS. However Stroke volume index of  9ml/M2 in echo lab seems too high for this diagnosis. Mean  gradient (S): 12 mm Hg. Peak gradient (S): 20 mm Hg. - Left atrium: The atrium was mildly dilated. - Right ventricle: The cavity size was mildly dilated. Systolic  function was normal.  ASSESSMENT AND PLAN: 1.  CAD, native vessel, without angina: Patient will continue on his current medical program. Will consider stopping Plavix at the time of his next visit in 6 months.  2. Hypertension: Continue metoprolol and amlodipine.  3. Aortic stenosis: follow-up echo in about 6 months. Exam stable.   Current medicines are reviewed with the patient today.  The patient does not have concerns regarding medicines.  Labs/ tests ordered today include:  No orders of the defined types were placed in this encounter.   Disposition:   FU 6 months  Signed, Sherren Mocha, MD  10/27/2015 3:50 PM    [facility] [address], [place], [place]  [address] Phone: [phone]; Fax: [phone]

## 2015-10-29 DIAGNOSIS — L270 Generalized skin eruption due to drugs and medicaments taken internally: Secondary | ICD-10-CM | POA: Diagnosis not present

## 2015-10-31 DIAGNOSIS — I2510 Atherosclerotic heart disease of native coronary artery without angina pectoris: Secondary | ICD-10-CM | POA: Diagnosis not present

## 2015-10-31 DIAGNOSIS — M4800 Spinal stenosis, site unspecified: Secondary | ICD-10-CM | POA: Diagnosis not present

## 2015-10-31 DIAGNOSIS — I129 Hypertensive chronic kidney disease with stage 1 through stage 4 chronic kidney disease, or unspecified chronic kidney disease: Secondary | ICD-10-CM | POA: Diagnosis not present

## 2015-10-31 DIAGNOSIS — N183 Chronic kidney disease, stage 3 (moderate): Secondary | ICD-10-CM | POA: Diagnosis not present

## 2015-10-31 DIAGNOSIS — L89152 Pressure ulcer of sacral region, stage 2: Secondary | ICD-10-CM | POA: Diagnosis not present

## 2015-10-31 DIAGNOSIS — Z8701 Personal history of pneumonia (recurrent): Secondary | ICD-10-CM | POA: Diagnosis not present

## 2015-11-04 ENCOUNTER — Telehealth: Payer: Self-pay | Admitting: *Deleted

## 2015-11-04 ENCOUNTER — Other Ambulatory Visit: Payer: Self-pay | Admitting: *Deleted

## 2015-11-04 NOTE — Telephone Encounter (Signed)
Received telephone message from patient that he is ready to "resume shots". Called patient and verified appts in May for lab, MD and injection. He verbalized understanding.

## 2015-11-14 ENCOUNTER — Encounter (HOSPITAL_BASED_OUTPATIENT_CLINIC_OR_DEPARTMENT_OTHER): Payer: Medicare Other | Attending: Internal Medicine

## 2015-11-24 ENCOUNTER — Telehealth: Payer: Self-pay | Admitting: Endocrinology

## 2015-11-24 MED ORDER — DIAZEPAM 5 MG PO TABS: 2.5000 mg | ORAL_TABLET | Freq: Four times a day (QID) | ORAL | Status: DC | PRN

## 2015-11-24 NOTE — Telephone Encounter (Signed)
PT daughter said he needs his Valium rx refilled.

## 2015-11-24 NOTE — Telephone Encounter (Signed)
Dr Doyle Askew prescribes lorazepam. What do you take the valium also?  If so, for what condition?

## 2015-11-24 NOTE — Telephone Encounter (Signed)
pts daughter notified of prescription ready for pick up at front desk.

## 2015-11-24 NOTE — Telephone Encounter (Signed)
Ok i printed the rx for valium The rx for lorazepam was from the hospital.

## 2015-11-24 NOTE — Telephone Encounter (Signed)
Please read message below and advise.  

## 2015-11-24 NOTE — Telephone Encounter (Addendum)
Pts daughter states that her father has never seen Dr.Meyers that he knows of and that he was never prescribed or has taken the lorazepam. Daughter states that the valium is for his vertigo.

## 2015-11-28 ENCOUNTER — Ambulatory Visit: Payer: Medicare Other | Admitting: Endocrinology

## 2015-11-28 DIAGNOSIS — Z0289 Encounter for other administrative examinations: Secondary | ICD-10-CM

## 2015-12-02 ENCOUNTER — Other Ambulatory Visit (INDEPENDENT_AMBULATORY_CARE_PROVIDER_SITE_OTHER): Payer: Medicare Other

## 2015-12-02 ENCOUNTER — Telehealth: Payer: Self-pay | Admitting: Endocrinology

## 2015-12-02 ENCOUNTER — Encounter: Payer: Self-pay | Admitting: Endocrinology

## 2015-12-02 ENCOUNTER — Ambulatory Visit (INDEPENDENT_AMBULATORY_CARE_PROVIDER_SITE_OTHER): Payer: Medicare Other | Admitting: Endocrinology

## 2015-12-02 ENCOUNTER — Other Ambulatory Visit: Payer: Self-pay

## 2015-12-02 VITALS — BP 153/69 | HR 66 | Temp 97.9°F | Resp 14 | Ht 70.0 in | Wt 154.8 lb

## 2015-12-02 DIAGNOSIS — E0590 Thyrotoxicosis, unspecified without thyrotoxic crisis or storm: Secondary | ICD-10-CM

## 2015-12-02 DIAGNOSIS — N183 Chronic kidney disease, stage 3 unspecified: Secondary | ICD-10-CM

## 2015-12-02 DIAGNOSIS — R4781 Slurred speech: Secondary | ICD-10-CM

## 2015-12-02 DIAGNOSIS — D649 Anemia, unspecified: Secondary | ICD-10-CM | POA: Diagnosis not present

## 2015-12-02 DIAGNOSIS — I2510 Atherosclerotic heart disease of native coronary artery without angina pectoris: Secondary | ICD-10-CM

## 2015-12-02 DIAGNOSIS — R358 Other polyuria: Secondary | ICD-10-CM

## 2015-12-02 DIAGNOSIS — R3589 Other polyuria: Secondary | ICD-10-CM

## 2015-12-02 LAB — URINALYSIS, ROUTINE W REFLEX MICROSCOPIC
Bilirubin Urine: NEGATIVE
Hgb urine dipstick: NEGATIVE
KETONES UR: NEGATIVE
Leukocytes, UA: NEGATIVE
NITRITE: NEGATIVE
RBC / HPF: NONE SEEN (ref 0–?)
SPECIFIC GRAVITY, URINE: 1.015 (ref 1.000–1.030)
Total Protein, Urine: NEGATIVE
UROBILINOGEN UA: 2 — AB (ref 0.0–1.0)
Urine Glucose: NEGATIVE
WBC UA: NONE SEEN (ref 0–?)
pH: 6.5 (ref 5.0–8.0)

## 2015-12-02 LAB — CBC WITH DIFFERENTIAL/PLATELET
BASOS ABS: 0 10*3/uL (ref 0.0–0.1)
Basophils Relative: 0.3 % (ref 0.0–3.0)
Eosinophils Absolute: 0.3 10*3/uL (ref 0.0–0.7)
Eosinophils Relative: 5 % (ref 0.0–5.0)
HEMATOCRIT: 34.7 % — AB (ref 39.0–52.0)
HEMOGLOBIN: 11.8 g/dL — AB (ref 13.0–17.0)
LYMPHS PCT: 20.2 % (ref 12.0–46.0)
Lymphs Abs: 1.3 10*3/uL (ref 0.7–4.0)
MCHC: 33.8 g/dL (ref 30.0–36.0)
MCV: 90.4 fl (ref 78.0–100.0)
MONOS PCT: 8.2 % (ref 3.0–12.0)
Monocytes Absolute: 0.5 10*3/uL (ref 0.1–1.0)
NEUTROS ABS: 4.1 10*3/uL (ref 1.4–7.7)
NEUTROS PCT: 66.3 % (ref 43.0–77.0)
PLATELETS: 194 10*3/uL (ref 150.0–400.0)
RBC: 3.84 Mil/uL — AB (ref 4.22–5.81)
RDW: 14.6 % (ref 11.5–15.5)
WBC: 6.3 10*3/uL (ref 4.0–10.5)

## 2015-12-02 LAB — IBC PANEL
Iron: 76 ug/dL (ref 42–165)
SATURATION RATIOS: 21 % (ref 20.0–50.0)
TRANSFERRIN: 259 mg/dL (ref 212.0–360.0)

## 2015-12-02 LAB — BASIC METABOLIC PANEL
BUN: 18 mg/dL (ref 6–23)
CHLORIDE: 106 meq/L (ref 96–112)
CO2: 25 meq/L (ref 19–32)
CREATININE: 1.01 mg/dL (ref 0.40–1.50)
Calcium: 10.2 mg/dL (ref 8.4–10.5)
GFR: 73.24 mL/min (ref >60.00)
Glucose, Bld: 97 mg/dL (ref 70–99)
POTASSIUM: 4.6 meq/L (ref 3.5–5.1)
Sodium: 139 mEq/L (ref 135–145)

## 2015-12-02 LAB — VITAMIN D 25 HYDROXY (VIT D DEFICIENCY, FRACTURES): VITD: 28.78 ng/mL — AB (ref 30.00–100.00)

## 2015-12-02 LAB — TSH: TSH: 3.77 u[IU]/mL (ref 0.35–4.50)

## 2015-12-02 MED ORDER — METHIMAZOLE 5 MG PO TABS: 5.0000 mg | ORAL_TABLET | ORAL | Status: DC

## 2015-12-02 MED ORDER — DIAZEPAM 2 MG PO TABS: 1.0000 mg | ORAL_TABLET | Freq: Every evening | ORAL | Status: AC | PRN

## 2015-12-02 NOTE — Telephone Encounter (Signed)
PT daughter called in and wanted to let Dr. Loanne Drilling that 911 was dispatched out to his home last night due to severe weakness and she would like a call back to know what is needed to do as far as following up with Dr. Loanne Drilling.

## 2015-12-02 NOTE — Patient Instructions (Addendum)
Please reduce the valium to 1/2 of a 2 mg as needed.  blood tests are requested for you today.  We'll let you know about the results.  I hope you feel better soon.  However, there are many possible causes of weakness, so if you don't feel better in the next few days, please call back.  Please call sooner if you get worse.

## 2015-12-02 NOTE — Progress Notes (Signed)
Subjective:    Patient ID: Kurt Elliott, male    DOB: 04/30/1921, 82 y.o.   MRN: [id number]  HPI Yesterday, pt had a episode of severe generalized weakness.  He had assoc slurred speech, but no LOC.  Family called 911.  They found BP slightly high.  Since then, he feels somewhat better, but is still very weak During recent hospitalization, he was rx'ed with valium and meclizine.  However, these have been held, out of concern of side-effects.  Past Medical History  Diagnosis Date  . Hypertension   . Hyperlipemia   . Unspecified disorder of liver   . Cellulitis, leg     left  . Pulmonary nodule, right     2008 / unchanged CT scan, July, 2009  . Adenocarcinoma of prostate ([place])   . Esophageal stricture   . Spinal stenosis   . Hyperglycemia   . Hyperthyroidism   . GERD (gastroesophageal reflux disease)   . Hx of colonic polyps   . Renal artery stenosis (H[facility])     Mild  . Atheroma     no penetrating ulcer, Abdominal aorta  . Inferior mesenteric artery injury     High-grade stenosis, CT scan, 2006  . Vertigo   . Diverticulum   . Incisional hernia   . Rash     Ticlid  . Cough     ACE Inhibitor, tolerates ARB  . Carotid artery disease ([place])     Doppler, 2006, minimal disease  //  Doppler, May, 2012, mild increase velocities, 40-59% bilateral stenoses  . Renal cyst     Hyperdense cyst lower pole left kidney  . CAD (coronary artery disease), native coronary artery     a. cath 2008  b. cath 10/21/2014 3v dzs w/ patent RCA stent, severe stenosis of mid LCx/OM1 s/p DES, moderately severe diffuse mid LAD treated with DES, patent prox LAD stent;  c. 01/2015 Cath: patent LAD, LCX, OM, and RCA stents->Med Rx.  . Mild aortic stenosis     a. 09/2014 Echo: EF 65-70%, Gr 1 DD, no rwma, mild AS;  b. 01/2015 Echo: EF 65-70%, mod LVH, no rwma, 53mmHg intracavitary gradient, mildly dil LA.    Past Surgical History  Procedure Laterality Date  . Esophagogastroduodenoscopy    . Cardiac  catheterization    . Incisional hernia repair    . Left heart catheterization with coronary angiogram N/A 10/21/2014    Procedure: LEFT HEART CATHETERIZATION WITH CORONARY ANGIOGRAM;  Surgeon: Sherren Mocha, MD;  Location: [facility] CATH LAB;  Service: Cardiovascular;  Laterality: N/A;  . Percutaneous coronary stent intervention (pci-s)  10/21/2014    Procedure: PERCUTANEOUS CORONARY STENT INTERVENTION (PCI-S);  Surgeon: Sherren Mocha, MD;  Location: [facility] CATH LAB;  Service: Cardiovascular;;  Mid LAD and OM1  . Sigmoid resection / rectopexy    . Cardiac catheterization N/A 02/18/2015    Procedure: Right/Left Heart Cath and Coronary Angiography;  Surgeon: Leonie Man, MD;  Location: [place] CV LAB;  Service: Cardiovascular;  Laterality: N/A;    Social History   Social History  . Marital Status: Married    Spouse Name: N/A  . Number of Children: 4  . Years of Education: N/A   Occupational History  . 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 Smoking status: Never Smoker   . Smokeless tobacco: Never Used  . Alcohol Use: No  . Drug Use: No  . Sexual Activity: Not on file   Other Topics Concern  .  Not on file   Social History Narrative    Current Outpatient Prescriptions on File Prior to Visit  Medication Sig Dispense Refill  . amLODipine (NORVASC) 5 MG tablet Take 1 tablet (5 mg total) by mouth daily. 30 tablet 1  . aspirin EC 81 MG tablet Take 81 mg by mouth daily.    . clopidogrel (PLAVIX) 75 MG tablet TAKE 1 TABLET (75 MG TOTAL) BY MOUTH DAILY WITH BREAKFAST. 90 tablet 2  . guaiFENesin-dextromethorphan (ROBITUSSIN DM) 100-10 MG/5ML syrup Take 5 mLs by mouth every 4 (four) hours as needed for cough. 118 mL 3  . Leuprolide Acetate (LUPRON DEPOT IM) Inject into the muscle as directed. Done at [place] office approx once every 3 months    . nitroGLYCERIN (NITROSTAT) 0.4 MG SL tablet Place 1 tablet (0.4 mg total) under the tongue every 5 (five) minutes as needed for  chest pain. 25 tablet 5  . pantoprazole (PROTONIX) 40 MG tablet TAKE 1 TABLET (40 MG TOTAL) BY MOUTH DAILY. 90 tablet 2   No current facility-administered medications on file prior to visit.    Allergies  Allergen Reactions  . Isosorbide Nitrate Other (See Comments)    "severe Headache" per patient  . Lisinopril Cough  . Ticlopidine Hcl Rash    Family History  Problem Relation Age of Onset  . Breast cancer Sister     2 sisters  . Pemphigus vulgaris Mother     died at 65  . Heart attack Father   . Hypertension Mother   . Hypertension Father   . Hypertension Sister   . Hypertension Brother   . Stroke Father     BP 153/69 mmHg  Pulse 66  Temp(Src) 97.9 F (36.6 C)  Resp 14  Ht 5\' 10"  (1.778 m)  Wt 154 lb 12.8 oz (70.217 kg)  BMI 22.21 kg/m2  SpO2 98%    Review of Systems He also has frequent urination and insomnia.  Denies headache, sob, n/v, fever, seizure, and chest pain.     Objective:   Physical Exam Vital signs: see vs page Gen: elderly, frail, no distress.  In wheelchair.   LUNGS:  Clear to auscultation HEART:  Regular rate and rhythm without murmurs noted. Normal S1,S2.   MSK: strength is diffusely normal for age.   PSYCH: Alert and well-oriented (except he says it is 12/02/15).  Does not appear anxious nor depressed.        Assessment & Plan:  generalized weakness, slurred speech, new, uncertain etiology.  We order labs.  We also discussed other options: decrease metoprolol and/or check CT Insomnia: we have to weigh QOL vs side-effects of rx Urinary frequency, new, uncertain etiology.  Patient is advised the following: Patient Instructions  Please reduce the valium to 1/2 of a 2 mg as needed.  blood tests are requested for you today.  We'll let you know about the results.  I hope you feel better soon.  However, there are many possible causes of weakness, so if you don't feel better in the next few days, please call back.  Please call sooner if you get  worse.

## 2015-12-02 NOTE — Telephone Encounter (Signed)
See note below

## 2015-12-02 NOTE — Telephone Encounter (Signed)
Pt is scheduled for today  °

## 2015-12-02 NOTE — Telephone Encounter (Signed)
[facility] Fax: Caller says her father has extreme weakness and a bit of slurred speech.

## 2015-12-03 ENCOUNTER — Telehealth: Payer: Self-pay | Admitting: Endocrinology

## 2015-12-03 DIAGNOSIS — R4781 Slurred speech: Secondary | ICD-10-CM | POA: Insufficient documentation

## 2015-12-03 LAB — PTH, INTACT AND CALCIUM
Calcium: 9.7 mg/dL (ref 8.4–10.5)
PTH: 23 pg/mL (ref 14–64)

## 2015-12-03 MED ORDER — METOPROLOL SUCCINATE ER 25 MG PO TB24: 12.5000 mg | ORAL_TABLET | Freq: Every day | ORAL | Status: DC

## 2015-12-03 NOTE — Telephone Encounter (Signed)
ok 

## 2015-12-03 NOTE — Telephone Encounter (Signed)
See note below and please advise, Thanks! 

## 2015-12-03 NOTE — Telephone Encounter (Signed)
Kurt Elliott is asking for [place] to get home health and PT if we could for [facility]

## 2015-12-05 NOTE — Telephone Encounter (Signed)
Referral sent to [facility].

## 2015-12-09 ENCOUNTER — Telehealth: Payer: Self-pay | Admitting: Oncology

## 2015-12-09 ENCOUNTER — Telehealth: Payer: Self-pay | Admitting: *Deleted

## 2015-12-09 ENCOUNTER — Other Ambulatory Visit: Payer: Medicare Other

## 2015-12-09 NOTE — Telephone Encounter (Signed)
per pof to r/s pt labs-cld & spoke to wife and gave r/s time & date for 5/19@12 

## 2015-12-09 NOTE — Telephone Encounter (Signed)
This RN received faxed communication from HIM per [facility] stating pt's wife called pm 12/08/2015 requesting to cancel appointment for injection today due to " he has a stomach virus".  Urgent POF sent to scheduling

## 2015-12-10 ENCOUNTER — Other Ambulatory Visit: Payer: Self-pay | Admitting: Endocrinology

## 2015-12-10 DIAGNOSIS — R54 Age-related physical debility: Secondary | ICD-10-CM | POA: Insufficient documentation

## 2015-12-11 ENCOUNTER — Telehealth: Payer: Self-pay | Admitting: Endocrinology

## 2015-12-11 NOTE — Telephone Encounter (Signed)
ok 

## 2015-12-11 NOTE — Telephone Encounter (Signed)
See note below and please advise, Thanks! 

## 2015-12-11 NOTE — Telephone Encounter (Signed)
Attempted to reach [facility]. Unable to at this time will try again at a later time.

## 2015-12-11 NOTE — Telephone Encounter (Signed)
Home health is recommending a nursing and OT assessment Skeet Simmer has done the OT and Barnabas Lister has helped them with PT before so can we approve this and send in the approval please  [facility] # [phone]

## 2015-12-12 ENCOUNTER — Other Ambulatory Visit (HOSPITAL_BASED_OUTPATIENT_CLINIC_OR_DEPARTMENT_OTHER): Payer: Medicare Other

## 2015-12-12 DIAGNOSIS — Z5111 Encounter for antineoplastic chemotherapy: Secondary | ICD-10-CM | POA: Diagnosis not present

## 2015-12-12 DIAGNOSIS — K219 Gastro-esophageal reflux disease without esophagitis: Secondary | ICD-10-CM | POA: Diagnosis not present

## 2015-12-12 DIAGNOSIS — D649 Anemia, unspecified: Secondary | ICD-10-CM | POA: Diagnosis not present

## 2015-12-12 DIAGNOSIS — Z7982 Long term (current) use of aspirin: Secondary | ICD-10-CM | POA: Diagnosis not present

## 2015-12-12 DIAGNOSIS — N183 Chronic kidney disease, stage 3 (moderate): Secondary | ICD-10-CM | POA: Diagnosis not present

## 2015-12-12 DIAGNOSIS — C61 Malignant neoplasm of prostate: Secondary | ICD-10-CM

## 2015-12-12 DIAGNOSIS — R262 Difficulty in walking, not elsewhere classified: Secondary | ICD-10-CM | POA: Diagnosis not present

## 2015-12-12 DIAGNOSIS — Z8546 Personal history of malignant neoplasm of prostate: Secondary | ICD-10-CM | POA: Diagnosis not present

## 2015-12-12 DIAGNOSIS — R471 Dysarthria and anarthria: Secondary | ICD-10-CM | POA: Diagnosis not present

## 2015-12-12 DIAGNOSIS — I129 Hypertensive chronic kidney disease with stage 1 through stage 4 chronic kidney disease, or unspecified chronic kidney disease: Secondary | ICD-10-CM | POA: Diagnosis not present

## 2015-12-12 LAB — CBC & DIFF AND RETIC
BASO%: 0 % (ref 0.0–2.0)
Basophils Absolute: 0 10*3/uL (ref 0.0–0.1)
EOS%: 3.2 % (ref 0.0–7.0)
Eosinophils Absolute: 0.2 10*3/uL (ref 0.0–0.5)
HCT: 35.3 % — ABNORMAL LOW (ref 38.4–49.9)
HGB: 12.3 g/dL — ABNORMAL LOW (ref 13.0–17.1)
IMMATURE RETIC FRACT: 0.6 % — AB (ref 3.00–10.60)
LYMPH#: 1.1 10*3/uL (ref 0.9–3.3)
LYMPH%: 22.2 % (ref 14.0–49.0)
MCH: 31.1 pg (ref 27.2–33.4)
MCHC: 34.8 g/dL (ref 32.0–36.0)
MCV: 89.1 fL (ref 79.3–98.0)
MONO#: 0.3 10*3/uL (ref 0.1–0.9)
MONO%: 6.3 % (ref 0.0–14.0)
NEUT%: 68.3 % (ref 39.0–75.0)
NEUTROS ABS: 3.5 10*3/uL (ref 1.5–6.5)
PLATELETS: 171 10*3/uL (ref 140–400)
RBC: 3.96 10*6/uL — AB (ref 4.20–5.82)
RDW: 13.7 % (ref 11.0–14.6)
RETIC %: 1.04 % (ref 0.80–1.80)
RETIC CT ABS: 41.18 10*3/uL (ref 34.80–93.90)
WBC: 5.1 10*3/uL (ref 4.0–10.3)

## 2015-12-12 LAB — IRON AND TIBC
%SAT: 18 % — ABNORMAL LOW (ref 20–55)
IRON: 50 ug/dL (ref 42–163)
TIBC: 280 ug/dL (ref 202–409)
UIBC: 230 ug/dL (ref 117–376)

## 2015-12-12 LAB — FERRITIN: FERRITIN: 102 ng/mL (ref 22–316)

## 2015-12-13 LAB — FOLATE: FOLATE: 17.3 ng/mL (ref >3.0)

## 2015-12-13 LAB — VITAMIN B12: VITAMIN B 12: 508 pg/mL (ref 211–946)

## 2015-12-15 ENCOUNTER — Telehealth: Payer: Self-pay | Admitting: Endocrinology

## 2015-12-15 NOTE — Telephone Encounter (Signed)
See note below and please advise, Thanks! 

## 2015-12-15 NOTE — Telephone Encounter (Signed)
I contacted Kurt [facility] with [facility] and gave verbal order.

## 2015-12-15 NOTE — Telephone Encounter (Signed)
[facility] calling to see if we could order a speech therapist # [phone]

## 2015-12-15 NOTE — Telephone Encounter (Signed)
[facility] called back and stated at this time they needed a order for speech therapy. Per Dr. Loanne Drilling verbal order for speech therapy has been approved.

## 2015-12-15 NOTE — Telephone Encounter (Signed)
ok 

## 2015-12-16 ENCOUNTER — Telehealth: Payer: Self-pay | Admitting: Oncology

## 2015-12-16 ENCOUNTER — Ambulatory Visit: Payer: Medicare Other

## 2015-12-16 ENCOUNTER — Other Ambulatory Visit (HOSPITAL_BASED_OUTPATIENT_CLINIC_OR_DEPARTMENT_OTHER): Payer: Medicare Other

## 2015-12-16 ENCOUNTER — Ambulatory Visit (HOSPITAL_BASED_OUTPATIENT_CLINIC_OR_DEPARTMENT_OTHER): Payer: Medicare Other | Admitting: Oncology

## 2015-12-16 VITALS — BP 149/75 | HR 90 | Temp 97.9°F | Resp 18 | Ht 70.0 in | Wt 154.7 lb

## 2015-12-16 DIAGNOSIS — R3589 Other polyuria: Secondary | ICD-10-CM

## 2015-12-16 DIAGNOSIS — L89151 Pressure ulcer of sacral region, stage 1: Secondary | ICD-10-CM

## 2015-12-16 DIAGNOSIS — N183 Chronic kidney disease, stage 3 unspecified: Secondary | ICD-10-CM

## 2015-12-16 DIAGNOSIS — C61 Malignant neoplasm of prostate: Secondary | ICD-10-CM

## 2015-12-16 DIAGNOSIS — R2681 Unsteadiness on feet: Secondary | ICD-10-CM

## 2015-12-16 DIAGNOSIS — D649 Anemia, unspecified: Secondary | ICD-10-CM

## 2015-12-16 DIAGNOSIS — R0602 Shortness of breath: Secondary | ICD-10-CM

## 2015-12-16 DIAGNOSIS — R42 Dizziness and giddiness: Secondary | ICD-10-CM

## 2015-12-16 DIAGNOSIS — M25551 Pain in right hip: Secondary | ICD-10-CM

## 2015-12-16 DIAGNOSIS — R54 Age-related physical debility: Secondary | ICD-10-CM

## 2015-12-16 DIAGNOSIS — M25559 Pain in unspecified hip: Secondary | ICD-10-CM

## 2015-12-16 DIAGNOSIS — R358 Other polyuria: Secondary | ICD-10-CM

## 2015-12-16 DIAGNOSIS — I739 Peripheral vascular disease, unspecified: Secondary | ICD-10-CM

## 2015-12-16 DIAGNOSIS — R3915 Urgency of urination: Secondary | ICD-10-CM

## 2015-12-16 DIAGNOSIS — R5383 Other fatigue: Secondary | ICD-10-CM | POA: Diagnosis not present

## 2015-12-16 DIAGNOSIS — I129 Hypertensive chronic kidney disease with stage 1 through stage 4 chronic kidney disease, or unspecified chronic kidney disease: Secondary | ICD-10-CM | POA: Diagnosis not present

## 2015-12-16 DIAGNOSIS — R471 Dysarthria and anarthria: Secondary | ICD-10-CM | POA: Diagnosis not present

## 2015-12-16 DIAGNOSIS — K219 Gastro-esophageal reflux disease without esophagitis: Secondary | ICD-10-CM | POA: Diagnosis not present

## 2015-12-16 DIAGNOSIS — M25552 Pain in left hip: Secondary | ICD-10-CM

## 2015-12-16 DIAGNOSIS — R262 Difficulty in walking, not elsewhere classified: Secondary | ICD-10-CM | POA: Diagnosis not present

## 2015-12-16 DIAGNOSIS — R5382 Chronic fatigue, unspecified: Secondary | ICD-10-CM

## 2015-12-16 DIAGNOSIS — I779 Disorder of arteries and arterioles, unspecified: Secondary | ICD-10-CM

## 2015-12-16 DIAGNOSIS — M545 Low back pain: Secondary | ICD-10-CM | POA: Diagnosis not present

## 2015-12-16 LAB — CBC WITH DIFFERENTIAL/PLATELET
BASO%: 0.4 % (ref 0.0–2.0)
BASOS ABS: 0 10*3/uL (ref 0.0–0.1)
EOS ABS: 0.2 10*3/uL (ref 0.0–0.5)
EOS%: 3.3 % (ref 0.0–7.0)
HCT: 35 % — ABNORMAL LOW (ref 38.4–49.9)
HGB: 12 g/dL — ABNORMAL LOW (ref 13.0–17.1)
LYMPH%: 17.7 % (ref 14.0–49.0)
MCH: 30.8 pg (ref 27.2–33.4)
MCHC: 34.1 g/dL (ref 32.0–36.0)
MCV: 90.3 fL (ref 79.3–98.0)
MONO#: 0.5 10*3/uL (ref 0.1–0.9)
MONO%: 8.7 % (ref 0.0–14.0)
NEUT#: 4 10*3/uL (ref 1.5–6.5)
NEUT%: 69.9 % (ref 39.0–75.0)
PLATELETS: 179 10*3/uL (ref 140–400)
RBC: 3.88 10*6/uL — AB (ref 4.20–5.82)
RDW: 14.4 % (ref 11.0–14.6)
WBC: 5.8 10*3/uL (ref 4.0–10.3)
lymph#: 1 10*3/uL (ref 0.9–3.3)

## 2015-12-16 LAB — COMPREHENSIVE METABOLIC PANEL
ALT: 28 U/L (ref 0–55)
ANION GAP: 7 meq/L (ref 3–11)
AST: 49 U/L — ABNORMAL HIGH (ref 5–34)
Albumin: 3.6 g/dL (ref 3.5–5.0)
Alkaline Phosphatase: 97 U/L (ref 40–150)
BILIRUBIN TOTAL: 1.24 mg/dL — AB (ref 0.20–1.20)
BUN: 18.5 mg/dL (ref 7.0–26.0)
CHLORIDE: 108 meq/L (ref 98–109)
CO2: 23 meq/L (ref 22–29)
Calcium: 9.9 mg/dL (ref 8.4–10.4)
Creatinine: 1.1 mg/dL (ref 0.7–1.3)
EGFR: 55 mL/min/{1.73_m2} — AB (ref >90)
Glucose: 116 mg/dl (ref 70–140)
POTASSIUM: 4.6 meq/L (ref 3.5–5.1)
Sodium: 138 mEq/L (ref 136–145)
TOTAL PROTEIN: 7 g/dL (ref 6.4–8.3)

## 2015-12-16 NOTE — Progress Notes (Signed)
ID: [name]   DOB: 21-Sep-1920  MR#: [id number]  [id number]  PCP: Renato Shin, MD GYN: SU:  OTHER MD: Germain Osgood [phone]); Franchot Gallo; Lavonna Monarch; Daryel November; Erskine Emery; Arloa Koh, Ileene Hutchinson [facility]  CHIEF COMPLAINT: Stage IV prostate cancer  CURRENT TREATMENT: lupron intermittently  HISTORY OF PROSTATE CANCER:  From the original intake note:  Mr. Slayton tells me his PSA and prostate had been followed for a very long time.  His PSA was approximately 15 about a year ago [2007], then about 6 months ago it went to 35, which means that it had just about doubled in 6 months.  Of course, that raised red flags and the patient had his prostate biopsied at [facility] under Dr. Gareth Eagle 08/02/2006.  This showed a Gleason 9 (4+5) adenocarcinoma in 5 of the 6 cores, the other core showing a Gleason 8 (3+5).  In short, an aggressive tumor.  The patient had a bone scan 08/10/2006, which was negative, and CTs of the abdomen and pelvis 08/15/2006, which showed an enlarged prostate at 5.7 x 5.1 cm, but no evidence of adenopathy or extracapsular spread; no obvious invasion of the rectum or the bladder; and negative bone windows.  There was incidental nodularity around the left perirenal fat of unclear significance.   The patient was started on Casodex for about a month before receiving his first Lupron shot 09/05/2006.  He had a second Lupron shot 09/09/2006.  His PSA responded well, and his subsequent history is as detailed below.  INTERVAL HISTORY: Kaynin returns today for followup of his metastatic prostate cancer accompanied by his wife Rod Holler and his daughter Judson Roch.. We are treating him with Lupron intermittently, and his most recent dose was 09/12/2015. He was scheduled here 12/09/2015 for labs and treatments, but he had a "stomach virus" and did not show.  Note that earlier this month he had an episode of weakness and slurred speech which was evaluated by Dr. Loanne Drilling with normal  B12 folate and PTH/calcium levels. He is benefiting from home health physical therapy.   REVIEW OF SYSTEMS: Trigo did not have any overt complaints today but with the family's input a review of systems was positive for very altered sleep, with an waking up several times at night to urinate, severe fatigue, throbbing aches particularly in the morning, worsening hearing loss, problems with his dentures, difficulty walking, with high fall risk, and back pain. The family tells me that when he does receive a Lupron shot she gets even weaker and that it lasts quite a while for him to recover. Aside from these issues a detailed review of systems today was stable  Past Medical History  Diagnosis Date  . Hypertension   . Hyperlipemia   . Unspecified disorder of liver   . Cellulitis, leg     left  . Pulmonary nodule, right     2008 / unchanged CT scan, July, 2009  . Adenocarcinoma of prostate ([place])   . Esophageal stricture   . Spinal stenosis   . Hyperglycemia   . Hyperthyroidism   . GERD (gastroesophageal reflux disease)   . Hx of colonic polyps   . Renal artery stenosis (HCC)     Mild  . Atheroma     no penetrating ulcer, Abdominal aorta  . Inferior mesenteric artery injury     High-grade stenosis, CT scan, 2006  . Vertigo   . Diverticulum   . Incisional hernia   . Rash     Ticlid  .  Cough     ACE Inhibitor, tolerates ARB  . Carotid artery disease ([place])     Doppler, November 06, 2004, minimal disease  //  Doppler, May, 2012, mild increase velocities, 40-59% bilateral stenoses  . Renal cyst     Hyperdense cyst lower pole left kidney  . CAD (coronary artery disease), native coronary artery     a. cath Nov 07, 2006  b. cath 10/21/2014 3v dzs w/ patent RCA stent, severe stenosis of mid LCx/OM1 s/p DES, moderately severe diffuse mid LAD treated with DES, patent prox LAD stent;  c. 01/2015 Cath: patent LAD, LCX, OM, and RCA stents->Med Rx.  . Mild aortic stenosis     a. 09/2014 Echo: EF 65-70%, Gr 1 DD, no rwma,  mild AS;  b. 01/2015 Echo: EF 65-70%, mod LVH, no rwma, 65mmHg intracavitary gradient, mildly dil LA.   PAST SURGICAL HISTORY: Past Surgical History  Procedure Laterality Date  . Esophagogastroduodenoscopy    . Cardiac catheterization    . Incisional hernia repair    . Left heart catheterization with coronary angiogram N/A 10/21/2014    Procedure: LEFT HEART CATHETERIZATION WITH CORONARY ANGIOGRAM;  Surgeon: Sherren Mocha, MD;  Location: [facility] CATH LAB;  Service: Cardiovascular;  Laterality: N/A;  . Percutaneous coronary stent intervention (pci-s)  10/21/2014    Procedure: PERCUTANEOUS CORONARY STENT INTERVENTION (PCI-S);  Surgeon: Sherren Mocha, MD;  Location: [facility] CATH LAB;  Service: Cardiovascular;;  Mid LAD and OM1  . Sigmoid resection / rectopexy    . Cardiac catheterization N/A 02/18/2015    Procedure: Right/Left Heart Cath and Coronary Angiography;  Surgeon: Leonie Man, MD;  Location: [place] CV LAB;  Service: Cardiovascular;  Laterality: N/A;    FAMILY HISTORY Family History  Problem Relation Age of Onset  . Breast cancer Sister     2 sisters  . Pemphigus vulgaris Mother     died at 38  . Heart attack Father   . Hypertension Mother   . Hypertension Father   . Hypertension Sister   . Hypertension Brother   . Stroke Father   The patient's father died at the age of 53 from an embolus.  The patient's mother died at the age of 83 in the setting of dementia.  The patient had 1 sister die with breast cancer (neglected), 1 sister died with diabetes, and 1 brother with Parkinson's disease.  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  He has been married 60+ years to Rod Holler, who used to be a Firefighter.  They have 4 children; 2 of them are Urologists in [place] (married to twin sisters).  In particular, they would like me to contact Aaron Edelman [phone] is the fax #) with any change in plans.  One of the sons, the oldest one, was a [place] in [place] (died in 11/06/12  and from non-Hodgkin's lymphoma) and then the daughter, Clarise Cruz, is our Dr. Rosanna Randy wife. The patient has a total of 12 grandchildren and is waiting on great-grandchildren.   ADVANCED DIRECTIVES: in place  HEALTH MAINTENANCE: Social History  Substance Use Topics  . Smoking status: Never Smoker   . Smokeless tobacco: Never Used  . Alcohol Use: No     Colonoscopy: 11-07-2003  Bone density:  Lipid panel:  Allergies  Allergen Reactions  . Isosorbide Nitrate Other (See Comments)    "severe Headache" per patient  . Lisinopril Cough  . Ticlopidine Hcl Rash    Current Outpatient Prescriptions  Medication Sig Dispense Refill  . amLODipine (NORVASC)  5 MG tablet Take 1 tablet (5 mg total) by mouth daily. 30 tablet 1  . aspirin EC 81 MG tablet Take 81 mg by mouth daily.    . clopidogrel (PLAVIX) 75 MG tablet TAKE 1 TABLET (75 MG TOTAL) BY MOUTH DAILY WITH BREAKFAST. 90 tablet 2  . diazepam (VALIUM) 2 MG tablet Take 0.5 tablets (1 mg total) by mouth at bedtime as needed (sleep). 30 tablet 0  . fluocinolone (SYNALAR) 0.01 % external solution APPLY TO AFFECTED EAR DAILY  1  . guaiFENesin-dextromethorphan (ROBITUSSIN DM) 100-10 MG/5ML syrup Take 5 mLs by mouth every 4 (four) hours as needed for cough. 118 mL 3  . Leuprolide Acetate (LUPRON DEPOT IM) Inject into the muscle as directed. Done at [facility] office approx once every 3 months    . methimazole (TAPAZOLE) 5 MG tablet Take 1 tablet (5 mg total) by mouth 3 (three) times a week. 15 tablet 5  . metoprolol succinate (TOPROL-XL) 25 MG 24 hr tablet Take 0.5 tablets (12.5 mg total) by mouth daily. 15 tablet 11  . nitroGLYCERIN (NITROSTAT) 0.4 MG SL tablet Place 1 tablet (0.4 mg total) under the tongue every 5 (five) minutes as needed for chest pain. 25 tablet 5  . pantoprazole (PROTONIX) 40 MG tablet TAKE 1 TABLET (40 MG TOTAL) BY MOUTH DAILY. 90 tablet 2   No current facility-administered medications for this visit.    OBJECTIVE: Elderly white  man who appears stated age. He is using a cane today Filed Vitals:   12/16/15 1203  BP: 149/75  Pulse: 90  Temp: 97.9 F (36.6 C)  Resp: 18     Body mass index is 22.2 kg/(m^2).    ECOG FS: 1  Sclerae unicteric, EOMs intact Oropharynx clear, no thrush or other lesions No cervical or supraclavicular adenopathy Lungs no rales or rhonchi Heart regular rate and rhythm Abd soft, nontender, positive bowel sounds MSK no focal spinal tenderness Neuro: nonfocal, pleasant affect  LAB RESULTS: Lab Results  Component Value Date   WBC 5.1 12/12/2015   NEUTROABS 3.5 12/12/2015   HGB 12.3* 12/12/2015   HCT 35.3* 12/12/2015   MCV 89.1 12/12/2015   PLT 171 12/12/2015      Chemistry      Component Value Date/Time   NA 139 12/02/2015 1509   NA 142 05/30/2015 1048   K 4.6 12/02/2015 1509   K 4.3 05/30/2015 1048   CL 106 12/02/2015 1509   CL 107 01/02/2013 1059   CO2 25 12/02/2015 1509   CO2 23 05/30/2015 1048   BUN 18 12/02/2015 1509   BUN 18.7 05/30/2015 1048   CREATININE 1.01 12/02/2015 1509   CREATININE 1.0 05/30/2015 1048   CREATININE 1.04 10/18/2014 1227      Component Value Date/Time   CALCIUM 10.2 12/02/2015 1509   CALCIUM 9.7 12/02/2015 1509   CALCIUM 10.4 05/30/2015 1048   CALCIUM 10.0 01/31/2007 2246   ALKPHOS 71 09/06/2015 0200   ALKPHOS 90 05/30/2015 1048   AST 39 09/06/2015 0200   AST 33 05/30/2015 1048   ALT 19 09/06/2015 0200   ALT 20 05/30/2015 1048   BILITOT 1.7* 09/06/2015 0200   BILITOT 1.15 05/30/2015 1048      No results found for: LABCA2  No components found for: LABCA125  No results for input(s): INR in the last 168 hours.  Urinalysis    Component Value Date/Time   COLORURINE YELLOW 12/02/2015 [place] 12/02/2015 1525   LABSPEC 1.015 12/02/2015 1525  PHURINE 6.5 12/02/2015 1525   GLUCOSEU NEGATIVE 12/02/2015 [place] 09/05/2015 2020   HGBUR NEGATIVE 12/02/2015 [place] 12/02/2015  [place] 12/02/2015 1525   PROTEINUR 30* 09/05/2015 2020   UROBILINOGEN 2.0* 12/02/2015 1525   NITRITE NEGATIVE 12/02/2015 1525   LEUKOCYTESUR NEGATIVE 12/02/2015 1525    STUDIES: No results found.  ASSESSMENT: 82 y.o.  [place] man with a history of prostate cancer initially biopsied January 2008, Gleason 4+5, with a rapid doubling time, treated initially with Casodex and Lupron with an excellent PSA response, status post radiation to 78 Gy given with curative intent completed October 2008.  Off Lupron as of December 2008.  (1) With the eventual development of right hip pain and a rapid PSA doubling time, he resumed Lupron and Casodex September 2011 with the Casodex stopped after a month.  He receives Lupron intermittently, depending on his PSA and testosterone levels, most recent dose 06/03/2015  PLAN:  Shammah unfortunately is beginning to decline and I am afraid given his age and overall condition we have a limited time here.  I don't think this has anything to do with his prostate cancer. However it is true that the Lupron does "knock him down" to some extent and he already has a more limited functional status than before.  We had decided previously to give him Lupron whenever the PSA rose above one, but perhaps we should reassess that and hold back on the Lupron unless there is a significant rise in the PSA, whenever "significant" may mean. We discussed that today and the patient and his family are comfortable with that approach.  Of course what we would not want is uncontrolled prostate cancer with pain and other complications. However his disease has been so slow and so easily controllable I am not anticipating that at this point.  We clarified the fact that his wife Rod Holler is his healthcare power of attorney.  Also Torran tells me all his papers are in place and "everything is taking care of". I think he would benefit from an in-home palliative care consult which would  provide one more layer of care. This is provided through hospice but unlike hospice does not mean we cannot do life prolonging treatment such as for example Lupron if we decide to continue that. What it does provide is an alternative if Jahair has a problem say at 2 in the morning, instead of calling 911 and ending up in the emergency room potentially hospice could send the palliative care nurse to the home at that time for further evaluation.  They are agreeable to a referral. They understand they can refuse at any point if they do not feel the service is worthwhile.  As far as the prostate cancer is concerned we are going to check a PSA today and then again in 3 months. Ac[place] (and Rod Holler) know to call for any problems that may develop before the next visit here.  MAGRINAT,GUSTAV C    12/16/2015

## 2015-12-16 NOTE — Progress Notes (Signed)
Writer called [facility] per Dr. Jana Hakim for patient to be evaluated by the Palliative Care Team.  Referral placed, writer spoke Verdis Frederickson in the [facility].

## 2015-12-16 NOTE — Progress Notes (Signed)
Pt's injection of Lupron today was canceled per Dr. Jana Hakim.

## 2015-12-16 NOTE — Telephone Encounter (Signed)
appt made and avs printed °

## 2015-12-16 NOTE — Telephone Encounter (Signed)
per pof to sch pt lasb after appt-moved injection ot after lab

## 2015-12-17 LAB — TESTOSTERONE, FREE, TOTAL, SHBG
Free Testosterone(Direct): 3.6 pg/mL — ABNORMAL LOW (ref 6.6–18.1)
SEX HORMONE BINDING: 104.9 nmol/L — AB (ref 19.3–76.4)
Testosterone, Serum: 70 ng/dL — ABNORMAL LOW (ref 348–1197)

## 2015-12-17 LAB — PSA: Prostate Specific Ag, Serum: 4.2 ng/mL — ABNORMAL HIGH (ref 0.0–4.0)

## 2015-12-18 DIAGNOSIS — N183 Chronic kidney disease, stage 3 (moderate): Secondary | ICD-10-CM | POA: Diagnosis not present

## 2015-12-18 DIAGNOSIS — R262 Difficulty in walking, not elsewhere classified: Secondary | ICD-10-CM | POA: Diagnosis not present

## 2015-12-18 DIAGNOSIS — K219 Gastro-esophageal reflux disease without esophagitis: Secondary | ICD-10-CM | POA: Diagnosis not present

## 2015-12-18 DIAGNOSIS — D649 Anemia, unspecified: Secondary | ICD-10-CM | POA: Diagnosis not present

## 2015-12-18 DIAGNOSIS — R471 Dysarthria and anarthria: Secondary | ICD-10-CM | POA: Diagnosis not present

## 2015-12-18 DIAGNOSIS — I129 Hypertensive chronic kidney disease with stage 1 through stage 4 chronic kidney disease, or unspecified chronic kidney disease: Secondary | ICD-10-CM | POA: Diagnosis not present

## 2015-12-19 ENCOUNTER — Other Ambulatory Visit: Payer: Self-pay | Admitting: Oncology

## 2015-12-19 NOTE — Progress Notes (Signed)
Called [facility] and spoke with his wife Kurt Elliott. I let them know the PSA has jumped to 4.2. They are very reluctant to undergo any further treatment because he is currently asymptomatic as far as his prostate cancer is concerned and the Lupron really affects his quality of life. I suggested we repeated in 6 weeks, but they prefer 3 months. Accordingly that is what we will do.

## 2015-12-22 IMAGING — CR DG CHEST 1V
1 series · 1 of 1 positions shown · non-contrast
Comparison: 10/12/2007 and chest CT dated 02/13/2008.

CLINICAL DATA: Shortness of breath.

EXAM:
CHEST  1 VIEW

[AP]
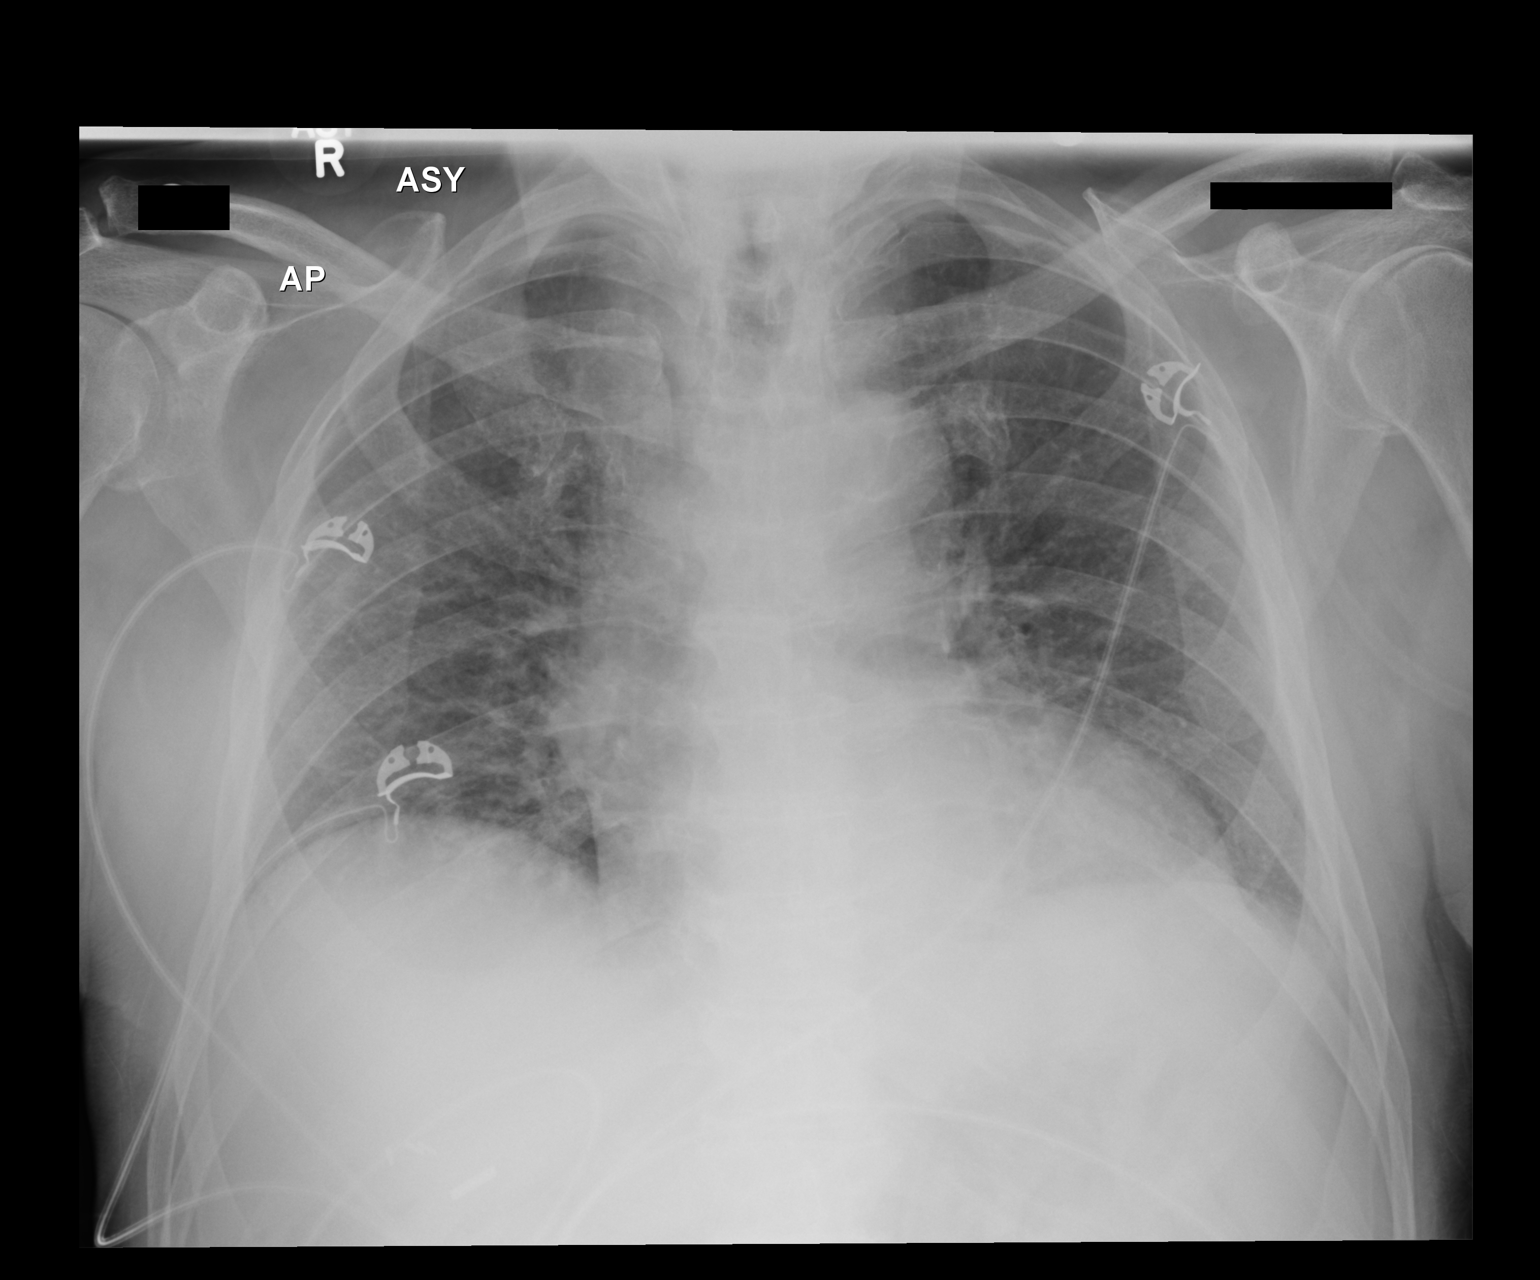

[1 of 1 positions shown; findings below may reference images not displayed]

FINDINGS: Poor inspiration. Borderline enlarged cardiac silhouette and
prominent pulmonary vasculature and interstitial markings. No
pleural fluid. Thoracic spine degenerative changes. Mild bilateral
shoulder degenerative changes.
IMPRESSION: Poor inspiration with borderline cardiomegaly and mild changes of
congestive heart failure.

## 2015-12-24 DIAGNOSIS — R471 Dysarthria and anarthria: Secondary | ICD-10-CM | POA: Diagnosis not present

## 2015-12-24 DIAGNOSIS — I129 Hypertensive chronic kidney disease with stage 1 through stage 4 chronic kidney disease, or unspecified chronic kidney disease: Secondary | ICD-10-CM | POA: Diagnosis not present

## 2015-12-24 DIAGNOSIS — N183 Chronic kidney disease, stage 3 (moderate): Secondary | ICD-10-CM | POA: Diagnosis not present

## 2015-12-24 DIAGNOSIS — D649 Anemia, unspecified: Secondary | ICD-10-CM | POA: Diagnosis not present

## 2015-12-24 DIAGNOSIS — K219 Gastro-esophageal reflux disease without esophagitis: Secondary | ICD-10-CM | POA: Diagnosis not present

## 2015-12-24 DIAGNOSIS — R262 Difficulty in walking, not elsewhere classified: Secondary | ICD-10-CM | POA: Diagnosis not present

## 2015-12-26 DIAGNOSIS — I129 Hypertensive chronic kidney disease with stage 1 through stage 4 chronic kidney disease, or unspecified chronic kidney disease: Secondary | ICD-10-CM | POA: Diagnosis not present

## 2015-12-26 DIAGNOSIS — D649 Anemia, unspecified: Secondary | ICD-10-CM | POA: Diagnosis not present

## 2015-12-26 DIAGNOSIS — N183 Chronic kidney disease, stage 3 (moderate): Secondary | ICD-10-CM | POA: Diagnosis not present

## 2015-12-26 DIAGNOSIS — K219 Gastro-esophageal reflux disease without esophagitis: Secondary | ICD-10-CM | POA: Diagnosis not present

## 2015-12-26 DIAGNOSIS — R471 Dysarthria and anarthria: Secondary | ICD-10-CM | POA: Diagnosis not present

## 2015-12-26 DIAGNOSIS — R262 Difficulty in walking, not elsewhere classified: Secondary | ICD-10-CM | POA: Diagnosis not present

## 2015-12-29 DIAGNOSIS — I129 Hypertensive chronic kidney disease with stage 1 through stage 4 chronic kidney disease, or unspecified chronic kidney disease: Secondary | ICD-10-CM | POA: Diagnosis not present

## 2015-12-29 DIAGNOSIS — K219 Gastro-esophageal reflux disease without esophagitis: Secondary | ICD-10-CM | POA: Diagnosis not present

## 2015-12-29 DIAGNOSIS — D649 Anemia, unspecified: Secondary | ICD-10-CM | POA: Diagnosis not present

## 2015-12-29 DIAGNOSIS — R471 Dysarthria and anarthria: Secondary | ICD-10-CM | POA: Diagnosis not present

## 2015-12-29 DIAGNOSIS — N183 Chronic kidney disease, stage 3 (moderate): Secondary | ICD-10-CM | POA: Diagnosis not present

## 2015-12-29 DIAGNOSIS — R262 Difficulty in walking, not elsewhere classified: Secondary | ICD-10-CM | POA: Diagnosis not present

## 2016-01-01 DIAGNOSIS — R262 Difficulty in walking, not elsewhere classified: Secondary | ICD-10-CM | POA: Diagnosis not present

## 2016-01-01 DIAGNOSIS — I129 Hypertensive chronic kidney disease with stage 1 through stage 4 chronic kidney disease, or unspecified chronic kidney disease: Secondary | ICD-10-CM | POA: Diagnosis not present

## 2016-01-01 DIAGNOSIS — N183 Chronic kidney disease, stage 3 (moderate): Secondary | ICD-10-CM | POA: Diagnosis not present

## 2016-01-01 DIAGNOSIS — R471 Dysarthria and anarthria: Secondary | ICD-10-CM | POA: Diagnosis not present

## 2016-01-01 DIAGNOSIS — D649 Anemia, unspecified: Secondary | ICD-10-CM | POA: Diagnosis not present

## 2016-01-01 DIAGNOSIS — K219 Gastro-esophageal reflux disease without esophagitis: Secondary | ICD-10-CM | POA: Diagnosis not present

## 2016-01-02 DIAGNOSIS — I129 Hypertensive chronic kidney disease with stage 1 through stage 4 chronic kidney disease, or unspecified chronic kidney disease: Secondary | ICD-10-CM | POA: Diagnosis not present

## 2016-01-02 DIAGNOSIS — R471 Dysarthria and anarthria: Secondary | ICD-10-CM | POA: Diagnosis not present

## 2016-01-02 DIAGNOSIS — D649 Anemia, unspecified: Secondary | ICD-10-CM | POA: Diagnosis not present

## 2016-01-02 DIAGNOSIS — N183 Chronic kidney disease, stage 3 (moderate): Secondary | ICD-10-CM | POA: Diagnosis not present

## 2016-01-02 DIAGNOSIS — K219 Gastro-esophageal reflux disease without esophagitis: Secondary | ICD-10-CM | POA: Diagnosis not present

## 2016-01-02 DIAGNOSIS — R262 Difficulty in walking, not elsewhere classified: Secondary | ICD-10-CM | POA: Diagnosis not present

## 2016-01-06 DIAGNOSIS — N183 Chronic kidney disease, stage 3 (moderate): Secondary | ICD-10-CM | POA: Diagnosis not present

## 2016-01-06 DIAGNOSIS — R262 Difficulty in walking, not elsewhere classified: Secondary | ICD-10-CM | POA: Diagnosis not present

## 2016-01-06 DIAGNOSIS — K219 Gastro-esophageal reflux disease without esophagitis: Secondary | ICD-10-CM | POA: Diagnosis not present

## 2016-01-06 DIAGNOSIS — R471 Dysarthria and anarthria: Secondary | ICD-10-CM | POA: Diagnosis not present

## 2016-01-06 DIAGNOSIS — D649 Anemia, unspecified: Secondary | ICD-10-CM | POA: Diagnosis not present

## 2016-01-06 DIAGNOSIS — I129 Hypertensive chronic kidney disease with stage 1 through stage 4 chronic kidney disease, or unspecified chronic kidney disease: Secondary | ICD-10-CM | POA: Diagnosis not present

## 2016-01-07 DIAGNOSIS — D649 Anemia, unspecified: Secondary | ICD-10-CM | POA: Diagnosis not present

## 2016-01-07 DIAGNOSIS — I129 Hypertensive chronic kidney disease with stage 1 through stage 4 chronic kidney disease, or unspecified chronic kidney disease: Secondary | ICD-10-CM | POA: Diagnosis not present

## 2016-01-07 DIAGNOSIS — R471 Dysarthria and anarthria: Secondary | ICD-10-CM | POA: Diagnosis not present

## 2016-01-07 DIAGNOSIS — R262 Difficulty in walking, not elsewhere classified: Secondary | ICD-10-CM | POA: Diagnosis not present

## 2016-01-07 DIAGNOSIS — N183 Chronic kidney disease, stage 3 (moderate): Secondary | ICD-10-CM | POA: Diagnosis not present

## 2016-01-07 DIAGNOSIS — K219 Gastro-esophageal reflux disease without esophagitis: Secondary | ICD-10-CM | POA: Diagnosis not present

## 2016-01-08 DIAGNOSIS — R4701 Aphasia: Secondary | ICD-10-CM | POA: Diagnosis not present

## 2016-01-08 DIAGNOSIS — F321 Major depressive disorder, single episode, moderate: Secondary | ICD-10-CM | POA: Diagnosis not present

## 2016-01-08 DIAGNOSIS — F5101 Primary insomnia: Secondary | ICD-10-CM | POA: Diagnosis not present

## 2016-01-08 DIAGNOSIS — H6122 Impacted cerumen, left ear: Secondary | ICD-10-CM | POA: Diagnosis not present

## 2016-01-13 DIAGNOSIS — R471 Dysarthria and anarthria: Secondary | ICD-10-CM | POA: Diagnosis not present

## 2016-01-13 DIAGNOSIS — R262 Difficulty in walking, not elsewhere classified: Secondary | ICD-10-CM | POA: Diagnosis not present

## 2016-01-13 DIAGNOSIS — K219 Gastro-esophageal reflux disease without esophagitis: Secondary | ICD-10-CM | POA: Diagnosis not present

## 2016-01-13 DIAGNOSIS — I129 Hypertensive chronic kidney disease with stage 1 through stage 4 chronic kidney disease, or unspecified chronic kidney disease: Secondary | ICD-10-CM | POA: Diagnosis not present

## 2016-01-13 DIAGNOSIS — N183 Chronic kidney disease, stage 3 (moderate): Secondary | ICD-10-CM | POA: Diagnosis not present

## 2016-01-13 DIAGNOSIS — D649 Anemia, unspecified: Secondary | ICD-10-CM | POA: Diagnosis not present

## 2016-01-15 DIAGNOSIS — R471 Dysarthria and anarthria: Secondary | ICD-10-CM | POA: Diagnosis not present

## 2016-01-15 DIAGNOSIS — I129 Hypertensive chronic kidney disease with stage 1 through stage 4 chronic kidney disease, or unspecified chronic kidney disease: Secondary | ICD-10-CM | POA: Diagnosis not present

## 2016-01-15 DIAGNOSIS — D649 Anemia, unspecified: Secondary | ICD-10-CM | POA: Diagnosis not present

## 2016-01-15 DIAGNOSIS — N183 Chronic kidney disease, stage 3 (moderate): Secondary | ICD-10-CM | POA: Diagnosis not present

## 2016-01-15 DIAGNOSIS — K219 Gastro-esophageal reflux disease without esophagitis: Secondary | ICD-10-CM | POA: Diagnosis not present

## 2016-01-15 DIAGNOSIS — R262 Difficulty in walking, not elsewhere classified: Secondary | ICD-10-CM | POA: Diagnosis not present

## 2016-01-23 DIAGNOSIS — H6123 Impacted cerumen, bilateral: Secondary | ICD-10-CM | POA: Diagnosis not present

## 2016-01-29 IMAGING — CR DG CHEST 2V
2 series · 2 of 2 positions shown · non-contrast
Comparison: Chest x-ray of February 18, 2015

CLINICAL DATA: Six days of productive cough, history of Coronary
artery disease with stent placement, prostate malignancy, chronic
renal insufficiency.

EXAM:
CHEST  2 VIEW

[w chest pa]
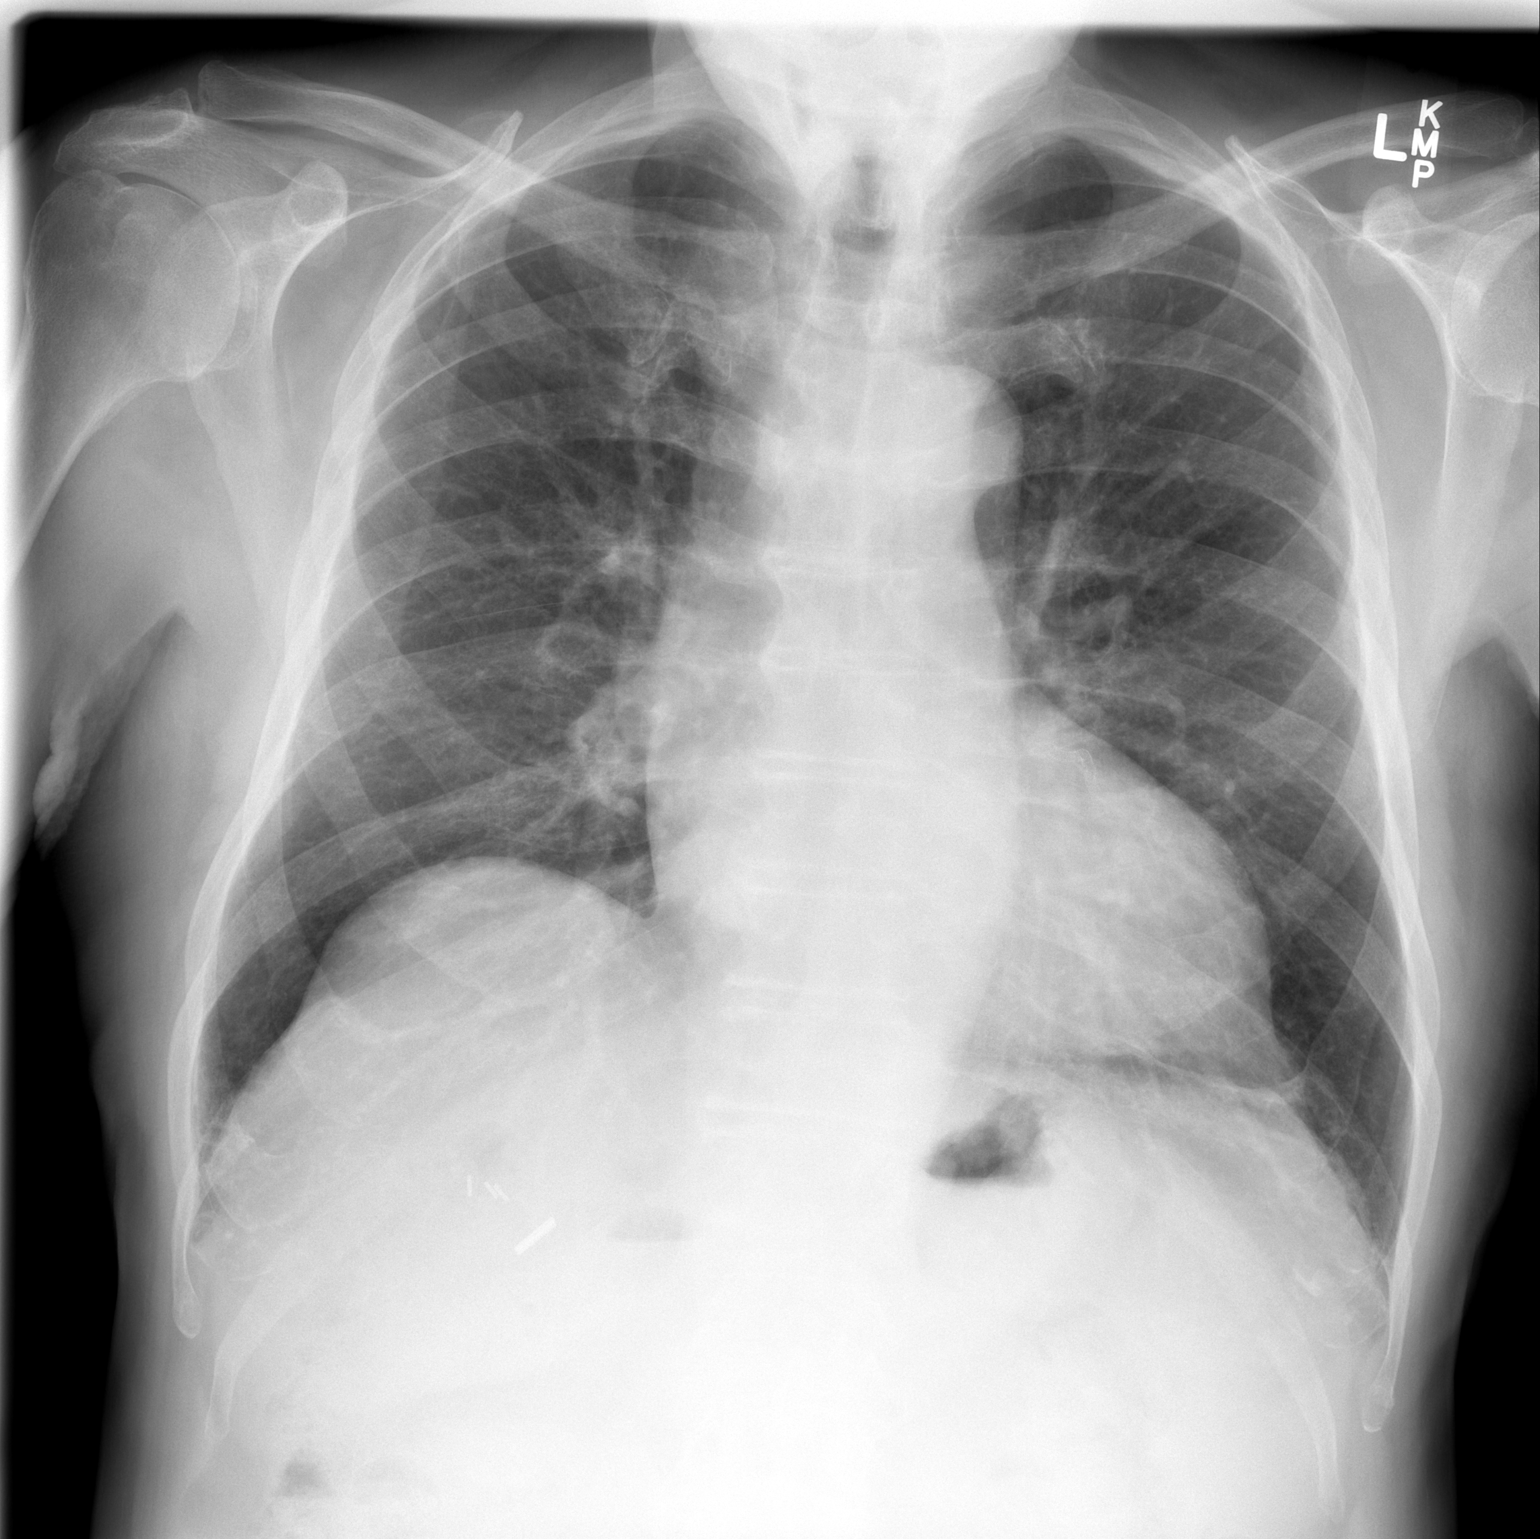

[w chest lat]
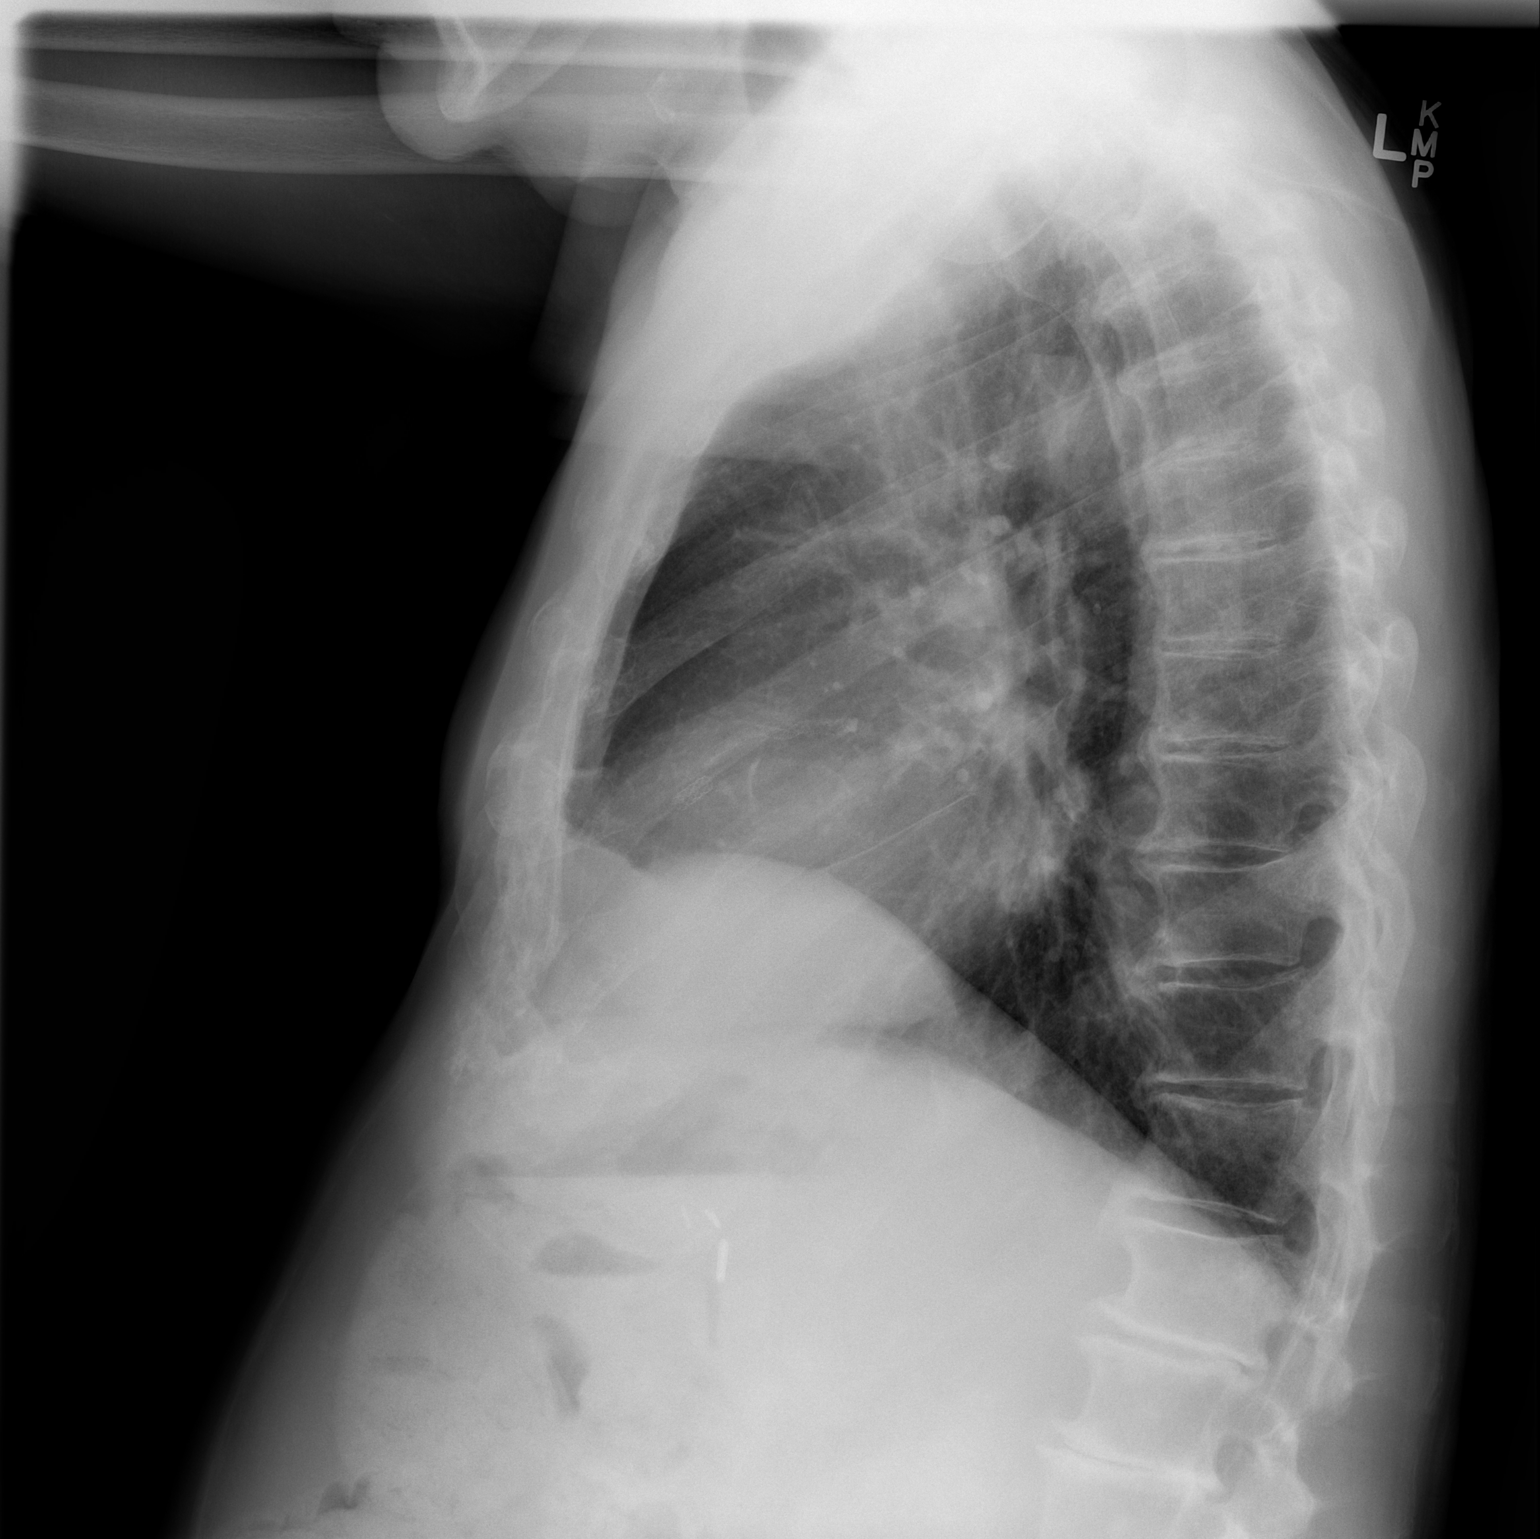

[2 of 2 positions shown; findings below may reference images not displayed]

FINDINGS: The lungs are adequately inflated. There are coarse perihilar lung
markings. The heart and pulmonary vascularity are normal. The
mediastinum is normal in width. There is no pleural effusion. There
is multilevel degenerative disc space narrowing of the thoracic
spine.
IMPRESSION: Acute bronchitic changes.  There is no pneumonia nor CHF.

## 2016-02-10 ENCOUNTER — Telehealth: Payer: Self-pay

## 2016-02-10 DIAGNOSIS — R251 Tremor, unspecified: Secondary | ICD-10-CM

## 2016-02-10 NOTE — Telephone Encounter (Signed)
done

## 2016-02-10 NOTE — Telephone Encounter (Signed)
Pt's son Aaron Edelman) called requesting a referral to home health to concentrate on upper limb strength. Pt's son stated the pt has been experiencing tremors and he thinks the physical therapy will help.

## 2016-02-13 DIAGNOSIS — F321 Major depressive disorder, single episode, moderate: Secondary | ICD-10-CM | POA: Diagnosis not present

## 2016-02-13 DIAGNOSIS — F5101 Primary insomnia: Secondary | ICD-10-CM | POA: Diagnosis not present

## 2016-02-16 DIAGNOSIS — H1013 Acute atopic conjunctivitis, bilateral: Secondary | ICD-10-CM | POA: Diagnosis not present

## 2016-02-17 DIAGNOSIS — Z955 Presence of coronary angioplasty implant and graft: Secondary | ICD-10-CM | POA: Diagnosis not present

## 2016-02-17 DIAGNOSIS — Z8546 Personal history of malignant neoplasm of prostate: Secondary | ICD-10-CM | POA: Diagnosis not present

## 2016-02-17 DIAGNOSIS — G250 Essential tremor: Secondary | ICD-10-CM | POA: Diagnosis not present

## 2016-02-17 DIAGNOSIS — I2510 Atherosclerotic heart disease of native coronary artery without angina pectoris: Secondary | ICD-10-CM | POA: Diagnosis not present

## 2016-02-17 DIAGNOSIS — D649 Anemia, unspecified: Secondary | ICD-10-CM | POA: Diagnosis not present

## 2016-02-17 DIAGNOSIS — K219 Gastro-esophageal reflux disease without esophagitis: Secondary | ICD-10-CM | POA: Diagnosis not present

## 2016-02-17 DIAGNOSIS — I129 Hypertensive chronic kidney disease with stage 1 through stage 4 chronic kidney disease, or unspecified chronic kidney disease: Secondary | ICD-10-CM | POA: Diagnosis not present

## 2016-02-17 DIAGNOSIS — E785 Hyperlipidemia, unspecified: Secondary | ICD-10-CM | POA: Diagnosis not present

## 2016-02-17 DIAGNOSIS — N183 Chronic kidney disease, stage 3 (moderate): Secondary | ICD-10-CM | POA: Diagnosis not present

## 2016-02-19 DIAGNOSIS — I2510 Atherosclerotic heart disease of native coronary artery without angina pectoris: Secondary | ICD-10-CM | POA: Diagnosis not present

## 2016-02-19 DIAGNOSIS — D649 Anemia, unspecified: Secondary | ICD-10-CM | POA: Diagnosis not present

## 2016-02-19 DIAGNOSIS — G250 Essential tremor: Secondary | ICD-10-CM | POA: Diagnosis not present

## 2016-02-19 DIAGNOSIS — I129 Hypertensive chronic kidney disease with stage 1 through stage 4 chronic kidney disease, or unspecified chronic kidney disease: Secondary | ICD-10-CM | POA: Diagnosis not present

## 2016-02-19 DIAGNOSIS — N183 Chronic kidney disease, stage 3 (moderate): Secondary | ICD-10-CM | POA: Diagnosis not present

## 2016-02-19 DIAGNOSIS — K219 Gastro-esophageal reflux disease without esophagitis: Secondary | ICD-10-CM | POA: Diagnosis not present

## 2016-02-24 DIAGNOSIS — I129 Hypertensive chronic kidney disease with stage 1 through stage 4 chronic kidney disease, or unspecified chronic kidney disease: Secondary | ICD-10-CM | POA: Diagnosis not present

## 2016-02-24 DIAGNOSIS — G250 Essential tremor: Secondary | ICD-10-CM | POA: Diagnosis not present

## 2016-02-24 DIAGNOSIS — N183 Chronic kidney disease, stage 3 (moderate): Secondary | ICD-10-CM | POA: Diagnosis not present

## 2016-02-24 DIAGNOSIS — K219 Gastro-esophageal reflux disease without esophagitis: Secondary | ICD-10-CM | POA: Diagnosis not present

## 2016-02-24 DIAGNOSIS — D649 Anemia, unspecified: Secondary | ICD-10-CM | POA: Diagnosis not present

## 2016-02-24 DIAGNOSIS — I2510 Atherosclerotic heart disease of native coronary artery without angina pectoris: Secondary | ICD-10-CM | POA: Diagnosis not present

## 2016-02-26 DIAGNOSIS — N183 Chronic kidney disease, stage 3 (moderate): Secondary | ICD-10-CM | POA: Diagnosis not present

## 2016-02-26 DIAGNOSIS — D649 Anemia, unspecified: Secondary | ICD-10-CM | POA: Diagnosis not present

## 2016-02-26 DIAGNOSIS — G250 Essential tremor: Secondary | ICD-10-CM | POA: Diagnosis not present

## 2016-02-26 DIAGNOSIS — I129 Hypertensive chronic kidney disease with stage 1 through stage 4 chronic kidney disease, or unspecified chronic kidney disease: Secondary | ICD-10-CM | POA: Diagnosis not present

## 2016-02-26 DIAGNOSIS — K219 Gastro-esophageal reflux disease without esophagitis: Secondary | ICD-10-CM | POA: Diagnosis not present

## 2016-02-26 DIAGNOSIS — I2510 Atherosclerotic heart disease of native coronary artery without angina pectoris: Secondary | ICD-10-CM | POA: Diagnosis not present

## 2016-03-02 ENCOUNTER — Emergency Department (HOSPITAL_COMMUNITY)
Admission: EM | Admit: 2016-03-02 | Discharge: 2016-03-03 | Disposition: A | Discharge status: Home / Self Care | Payer: Medicare Other | Attending: Emergency Medicine | Admitting: Emergency Medicine

## 2016-03-02 ENCOUNTER — Encounter (HOSPITAL_COMMUNITY): Payer: Self-pay | Admitting: Emergency Medicine

## 2016-03-02 DIAGNOSIS — Y92093 Driveway of other non-institutional residence as the place of occurrence of the external cause: Secondary | ICD-10-CM | POA: Insufficient documentation

## 2016-03-02 DIAGNOSIS — I2510 Atherosclerotic heart disease of native coronary artery without angina pectoris: Secondary | ICD-10-CM | POA: Diagnosis not present

## 2016-03-02 DIAGNOSIS — S0990XA Unspecified injury of head, initial encounter: Secondary | ICD-10-CM | POA: Diagnosis not present

## 2016-03-02 DIAGNOSIS — R51 Headache: Secondary | ICD-10-CM | POA: Diagnosis not present

## 2016-03-02 DIAGNOSIS — Y9301 Activity, walking, marching and hiking: Secondary | ICD-10-CM | POA: Insufficient documentation

## 2016-03-02 DIAGNOSIS — S0093XA Contusion of unspecified part of head, initial encounter: Secondary | ICD-10-CM | POA: Diagnosis not present

## 2016-03-02 DIAGNOSIS — W19XXXA Unspecified fall, initial encounter: Secondary | ICD-10-CM

## 2016-03-02 DIAGNOSIS — Y999 Unspecified external cause status: Secondary | ICD-10-CM | POA: Insufficient documentation

## 2016-03-02 DIAGNOSIS — S0001XA Abrasion of scalp, initial encounter: Secondary | ICD-10-CM | POA: Diagnosis not present

## 2016-03-02 DIAGNOSIS — Z955 Presence of coronary angioplasty implant and graft: Secondary | ICD-10-CM | POA: Insufficient documentation

## 2016-03-02 DIAGNOSIS — S098XXA Other specified injuries of head, initial encounter: Secondary | ICD-10-CM | POA: Diagnosis not present

## 2016-03-02 DIAGNOSIS — Z7982 Long term (current) use of aspirin: Secondary | ICD-10-CM | POA: Insufficient documentation

## 2016-03-02 DIAGNOSIS — W0110XA Fall on same level from slipping, tripping and stumbling with subsequent striking against unspecified object, initial encounter: Secondary | ICD-10-CM | POA: Diagnosis not present

## 2016-03-02 DIAGNOSIS — N183 Chronic kidney disease, stage 3 (moderate): Secondary | ICD-10-CM | POA: Diagnosis not present

## 2016-03-02 DIAGNOSIS — I129 Hypertensive chronic kidney disease with stage 1 through stage 4 chronic kidney disease, or unspecified chronic kidney disease: Secondary | ICD-10-CM | POA: Insufficient documentation

## 2016-03-02 DIAGNOSIS — S199XXA Unspecified injury of neck, initial encounter: Secondary | ICD-10-CM | POA: Diagnosis not present

## 2016-03-02 MED ORDER — ACETAMINOPHEN 325 MG PO TABS
650.0000 mg | ORAL_TABLET | Freq: Once | ORAL | Status: AC
  Administered 2016-03-02: 650 mg via ORAL
  Filled 2016-03-02: qty 2

## 2016-03-02 MED ORDER — ACETAMINOPHEN 325 MG PO TABS: 325.0000 mg | ORAL_TABLET | Freq: Once | ORAL | Status: DC

## 2016-03-02 NOTE — ED Triage Notes (Signed)
Pt presents from home with [facility] for fall that occurred at 8:45pm; pt reports was walking in driveway with daughter when lost balance and fell backward striking posterior head; pt on PLAVIX; pt denies LOC; fall witnessed by daughter; EMS reports high BP which pt has hx of

## 2016-03-02 NOTE — ED Provider Notes (Signed)
[facility] DEPT Provider Note   CSN: [id number] Arrival date & time: 03/02/16  2138  First Provider Contact:  None       History   Chief Complaint Chief Complaint  Patient presents with  . Fall  . Head Injury    HPI Kurt Elliott is a 82 y.o. male.  HPI  This patient suffered a fall this evening. It was witnessed by family, and they state he tripped and fell onto his head. They noticed swelling on his head and brought him for evaluation. He denies any symptoms, including headache, chest pain, abd pain. No LOC.   Past Medical History:  Diagnosis Date  . Adenocarcinoma of prostate ([place])   . Atheroma    no penetrating ulcer, Abdominal aorta  . CAD (coronary artery disease), native coronary artery    a. cath 2008  b. cath 10/21/2014 3v dzs w/ patent RCA stent, severe stenosis of mid LCx/OM1 s/p DES, moderately severe diffuse mid LAD treated with DES, patent prox LAD stent;  c. 01/2015 Cath: patent LAD, LCX, OM, and RCA stents->Med Rx.  . Carotid artery disease ([place])    Doppler, 2006, minimal disease  //  Doppler, May, 2012, mild increase velocities, 40-59% bilateral stenoses  . Cellulitis, leg    left  . Cough    ACE Inhibitor, tolerates ARB  . Diverticulum   . Esophageal stricture   . GERD (gastroesophageal reflux disease)   . Hx of colonic polyps   . Hyperglycemia   . Hyperlipemia   . Hypertension   . Hyperthyroidism   . Incisional hernia   . Inferior mesenteric artery injury    High-grade stenosis, CT scan, 2006  . Mild aortic stenosis    a. 09/2014 Echo: EF 65-70%, Gr 1 DD, no rwma, mild AS;  b. 01/2015 Echo: EF 65-70%, mod LVH, no rwma, 38mmHg intracavitary gradient, mildly dil LA.  . Pulmonary nodule, right    2008 / unchanged CT scan, July, 2009  . Rash    Ticlid  . Renal artery stenosis ([facility])    Mild  . Renal cyst    Hyperdense cyst lower pole left kidney  . Spinal stenosis   . Unspecified disorder of liver   . Vertigo     Patient Active Problem  List   Diagnosis Date Noted  . Tremor 02/10/2016  . Frail elderly 12/10/2015  . Slurred speech 12/03/2015  . Polyuria 12/02/2015  . Pain in joint, pelvic region and thigh 09/22/2015  . Decubitus ulcer of coccyx 09/22/2015  . Hypocalcemia 09/22/2015  . Fever   . Left lower lobe pneumonia   . Elevated troponin 09/06/2015  . Dehydration 09/05/2015  . SIRS (systemic inflammatory response syndrome) ([place]) 09/05/2015  . Viral illness   . Urinary urgency 09/02/2015  . Insomnia 02/19/2015  . Physical deconditioning 02/19/2015  . Dietary deficiency 02/19/2015  . Mitral regurgitation 02/19/2015  . Anemia 02/19/2015  . Aortic stenosis 02/18/2015  . Fatigue 02/18/2015  . Exertional shortness of breath 02/18/2015  . Sinus tachycardia ([place]) 02/18/2015  . CKD (chronic kidney disease) stage 3, GFR 30-59 ml/min 02/18/2015  . Hip pain, bilateral 03/25/2014  . Dizziness   . Unsteady gait   . Sinus bradycardia   . Ejection fraction   . Carotid artery disease ([place])   . CAD (coronary artery disease)   . Pulmonary nodule, right   . Renal artery stenosis ([place])   . Rash   . Cough   . Renal cyst   .  Inferior mesenteric artery injury   . ACUTE BRONCHITIS 06/29/2010  . VERTIGO 11/27/2009  . CELLULITIS, LEG, LEFT 12/30/2008  . UNSPECIFIED DISORDER OF LIVER 04/04/2008  . ABDOMINAL PAIN, LEFT LOWER QUADRANT 08/18/2007  . ADENOCARCINOMA, PROSTATE 06/15/2007  . Thyrotoxicosis 06/13/2007  . Hyperlipidemia 06/13/2007  . Essential hypertension 06/13/2007  . ESOPHAGEAL STRICTURE 06/13/2007  . GERD 06/13/2007  . SPINAL STENOSIS 06/13/2007  . HYPERGLYCEMIA 06/13/2007  . COLONIC POLYPS, HX OF 06/13/2007    Past Surgical History:  Procedure Laterality Date  . CARDIAC CATHETERIZATION    . CARDIAC CATHETERIZATION N/A 02/18/2015   Procedure: Right/Left Heart Cath and Coronary Angiography;  Surgeon: Leonie Man, MD;  Location: [facility];  Service: Cardiovascular;  Laterality: N/A;  .  ESOPHAGOGASTRODUODENOSCOPY    . INCISIONAL HERNIA REPAIR    . LEFT HEART CATHETERIZATION WITH CORONARY ANGIOGRAM N/A 10/21/2014   Procedure: LEFT HEART CATHETERIZATION WITH CORONARY ANGIOGRAM;  Surgeon: Sherren Mocha, MD;  Location: [facility] CATH LAB;  Service: Cardiovascular;  Laterality: N/A;  . PERCUTANEOUS CORONARY STENT INTERVENTION (PCI-S)  10/21/2014   Procedure: PERCUTANEOUS CORONARY STENT INTERVENTION (PCI-S);  Surgeon: Sherren Mocha, MD;  Location[facility] CATH LAB;  Service: Cardiovascular;;  Mid LAD and OM1  . SIGMOID RESECTION / RECTOPEXY         Home Medications    Prior to Admission medications   Medication Sig Start Date End Date Taking? Authorizing Provider  amLODipine (NORVASC) 5 MG tablet Take 1 tablet (5 mg total) by mouth daily. 09/12/15   Theodis Blaze, MD  aspirin EC 81 MG tablet Take 81 mg by mouth daily.    Historical Provider, MD  clopidogrel (PLAVIX) 75 MG tablet TAKE 1 TABLET (75 MG TOTAL) BY MOUTH DAILY WITH BREAKFAST. 10/20/15   Sherren Mocha, MD  diazepam (VALIUM) 2 MG tablet Take 0.5 tablets (1 mg total) by mouth at bedtime as needed (sleep). 12/02/15   Renato Shin, MD  fluocinolone (SYNALAR) 0.01 % external solution APPLY TO AFFECTED EAR DAILY 11/17/15   Historical Provider, MD  guaiFENesin-dextromethorphan (ROBITUSSIN DM) 100-10 MG/5ML syrup Take 5 mLs by mouth every 4 (four) hours as needed for cough. 09/12/15   Theodis Blaze, MD  Leuprolide Acetate (LUPRON DEPOT IM) Inject into the muscle as directed. Done at [place] office approx once every 3 months    Historical Provider, MD  methimazole (TAPAZOLE) 5 MG tablet Take 1 tablet (5 mg total) by mouth 3 (three) times a week. 12/02/15   Renato Shin, MD  metoprolol succinate (TOPROL-XL) 25 MG 24 hr tablet Take 0.5 tablets (12.5 mg total) by mouth daily. 12/03/15   Renato Shin, MD  nitroGLYCERIN (NITROSTAT) 0.4 MG SL tablet Place 1 tablet (0.4 mg total) under the tongue every 5 (five) minutes as needed for chest pain. 10/27/15    Sherren Mocha, MD  pantoprazole (PROTONIX) 40 MG tablet TAKE 1 TABLET (40 MG TOTAL) BY MOUTH DAILY. 07/31/15   Renato Shin, MD    Family History Family History  Problem Relation Age of Onset  . Pemphigus vulgaris Mother     died at 38  . Hypertension Mother   . Heart attack Father   . Hypertension Father   . Stroke Father   . Breast cancer Sister     2 sisters  . Hypertension Sister   . Hypertension Brother     Social History Social History  Substance Use Topics  . Smoking status: Never Smoker  . Smokeless tobacco: Never Used  . Alcohol use  No     Allergies   Isosorbide nitrate; Lisinopril; and Ticlopidine hcl   Review of Systems Review of Systems  Constitutional: Negative for chills and fever.  HENT: Negative for ear pain and sore throat.   Eyes: Negative for pain and visual disturbance.  Respiratory: Negative for cough and shortness of breath.   Cardiovascular: Negative for chest pain and palpitations.  Gastrointestinal: Negative for abdominal pain and vomiting.  Genitourinary: Negative for dysuria and hematuria.  Musculoskeletal: Negative for arthralgias and back pain.  Skin: Negative for color change and rash.  Neurological: Negative for seizures and syncope.  All other systems reviewed and are negative.    Physical Exam Updated Vital Signs BP 192/66 (BP Location: Right Arm)   Pulse 74   Temp 98.2 F (36.8 C) (Oral)   Resp 14   SpO2 100%   Physical Exam  Constitutional: He appears well-developed and well-nourished.  HENT:  Head: Normocephalic.  Posterior scalp abrasions  Eyes: Conjunctivae are normal.  Neck: Neck supple.  Cardiovascular: Normal rate and regular rhythm.   No murmur heard. Pulmonary/Chest: Effort normal and breath sounds normal. No respiratory distress.  Abdominal: Soft. There is no tenderness.  Musculoskeletal: He exhibits no edema.  Neurological: He is alert.  Skin: Skin is warm and dry.  Psychiatric: He has a normal mood and  affect.  Nursing note and vitals reviewed.    ED Treatments / Results  Labs (all labs ordered are listed, but only abnormal results are displayed) Labs Reviewed - No data to display  EKG  EKG Interpretation None       Radiology No results found.  Procedures Procedures (including critical care time)  Medications Ordered in ED Medications - No data to display   Initial Impression / Assessment and Plan / ED Course  I have reviewed the triage vital signs and the nursing notes.  Pertinent labs & imaging results that were available during my care of the patient were reviewed by me and considered in my medical decision making (see chart for details).  Clinical Course   On exam, patient is HDS. He has some posterior scalp abrasions which were cleaned. No other signs of trauma. The fall sounded purely mechanical in nature, and his ambulation here is baseline. CT head and c-spine were ordered and were negative.  We have discussed the discharge plan, including the plan for outpatient followup, and strict return precautions, including those that would require calling 911.      Final Clinical Impressions(s) / ED Diagnoses   Final diagnoses:  None    New Prescriptions New Prescriptions   No medications on file     Levada Schilling, MD 03/03/16 [id number]    Sherwood Gambler, MD 03/04/16 [phone]

## 2016-03-03 ENCOUNTER — Emergency Department (HOSPITAL_COMMUNITY): Payer: Medicare Other

## 2016-03-03 DIAGNOSIS — S0990XA Unspecified injury of head, initial encounter: Secondary | ICD-10-CM | POA: Diagnosis not present

## 2016-03-03 DIAGNOSIS — R51 Headache: Secondary | ICD-10-CM | POA: Diagnosis not present

## 2016-03-03 DIAGNOSIS — S0001XA Abrasion of scalp, initial encounter: Secondary | ICD-10-CM | POA: Diagnosis not present

## 2016-03-03 DIAGNOSIS — S199XXA Unspecified injury of neck, initial encounter: Secondary | ICD-10-CM | POA: Diagnosis not present

## 2016-03-03 LAB — URINALYSIS, ROUTINE W REFLEX MICROSCOPIC
Bilirubin Urine: NEGATIVE
GLUCOSE, UA: NEGATIVE mg/dL
Hgb urine dipstick: NEGATIVE
KETONES UR: NEGATIVE mg/dL
LEUKOCYTES UA: NEGATIVE
Nitrite: NEGATIVE
PH: 7.5 (ref 5.0–8.0)
Protein, ur: NEGATIVE mg/dL
SPECIFIC GRAVITY, URINE: 1.023 (ref 1.005–1.030)

## 2016-03-03 NOTE — ED Notes (Signed)
Patient back from CT.

## 2016-03-03 NOTE — ED Notes (Signed)
Patient c/o painful urination.  Dr aware.

## 2016-03-03 NOTE — ED Notes (Signed)
Discharge instructions reviewed - voiced understanding.  Assisted patient to the wheelchair

## 2016-03-03 NOTE — ED Notes (Signed)
Patient to CT.

## 2016-03-08 ENCOUNTER — Other Ambulatory Visit (HOSPITAL_BASED_OUTPATIENT_CLINIC_OR_DEPARTMENT_OTHER): Payer: Medicare Other

## 2016-03-08 DIAGNOSIS — D649 Anemia, unspecified: Secondary | ICD-10-CM

## 2016-03-08 DIAGNOSIS — M25551 Pain in right hip: Secondary | ICD-10-CM

## 2016-03-08 DIAGNOSIS — I739 Peripheral vascular disease, unspecified: Secondary | ICD-10-CM

## 2016-03-08 DIAGNOSIS — R3915 Urgency of urination: Secondary | ICD-10-CM

## 2016-03-08 DIAGNOSIS — I779 Disorder of arteries and arterioles, unspecified: Secondary | ICD-10-CM

## 2016-03-08 DIAGNOSIS — N183 Chronic kidney disease, stage 3 unspecified: Secondary | ICD-10-CM

## 2016-03-08 DIAGNOSIS — M25559 Pain in unspecified hip: Secondary | ICD-10-CM

## 2016-03-08 DIAGNOSIS — R2681 Unsteadiness on feet: Secondary | ICD-10-CM

## 2016-03-08 DIAGNOSIS — L89151 Pressure ulcer of sacral region, stage 1: Secondary | ICD-10-CM | POA: Diagnosis not present

## 2016-03-08 DIAGNOSIS — R42 Dizziness and giddiness: Secondary | ICD-10-CM

## 2016-03-08 DIAGNOSIS — C61 Malignant neoplasm of prostate: Secondary | ICD-10-CM | POA: Diagnosis not present

## 2016-03-08 DIAGNOSIS — R0602 Shortness of breath: Secondary | ICD-10-CM

## 2016-03-08 DIAGNOSIS — R358 Other polyuria: Secondary | ICD-10-CM | POA: Diagnosis not present

## 2016-03-08 DIAGNOSIS — R54 Age-related physical debility: Secondary | ICD-10-CM

## 2016-03-08 DIAGNOSIS — M25552 Pain in left hip: Secondary | ICD-10-CM

## 2016-03-08 DIAGNOSIS — R5382 Chronic fatigue, unspecified: Secondary | ICD-10-CM

## 2016-03-08 DIAGNOSIS — R3589 Other polyuria: Secondary | ICD-10-CM

## 2016-03-08 LAB — CBC WITH DIFFERENTIAL/PLATELET
BASO%: 0.2 % (ref 0.0–2.0)
Basophils Absolute: 0 10*3/uL (ref 0.0–0.1)
EOS%: 3.4 % (ref 0.0–7.0)
Eosinophils Absolute: 0.2 10*3/uL (ref 0.0–0.5)
HEMATOCRIT: 30.9 % — AB (ref 38.4–49.9)
HGB: 10.8 g/dL — ABNORMAL LOW (ref 13.0–17.1)
LYMPH#: 1 10*3/uL (ref 0.9–3.3)
LYMPH%: 18 % (ref 14.0–49.0)
MCH: 32.3 pg (ref 27.2–33.4)
MCHC: 35 g/dL (ref 32.0–36.0)
MCV: 92.5 fL (ref 79.3–98.0)
MONO#: 0.5 10*3/uL (ref 0.1–0.9)
MONO%: 9.5 % (ref 0.0–14.0)
NEUT#: 3.7 10*3/uL (ref 1.5–6.5)
NEUT%: 68.9 % (ref 39.0–75.0)
Platelets: 154 10*3/uL (ref 140–400)
RBC: 3.34 10*6/uL — AB (ref 4.20–5.82)
RDW: 13.6 % (ref 11.0–14.6)
WBC: 5.3 10*3/uL (ref 4.0–10.3)

## 2016-03-08 LAB — COMPREHENSIVE METABOLIC PANEL
ALT: 21 U/L (ref 0–55)
AST: 38 U/L — AB (ref 5–34)
Albumin: 3.6 g/dL (ref 3.5–5.0)
Alkaline Phosphatase: 111 U/L (ref 40–150)
Anion Gap: 7 mEq/L (ref 3–11)
BUN: 21.1 mg/dL (ref 7.0–26.0)
CHLORIDE: 106 meq/L (ref 98–109)
CO2: 24 meq/L (ref 22–29)
CREATININE: 1 mg/dL (ref 0.7–1.3)
Calcium: 9.9 mg/dL (ref 8.4–10.4)
EGFR: 66 mL/min/{1.73_m2} — ABNORMAL LOW (ref >90)
GLUCOSE: 98 mg/dL (ref 70–140)
Potassium: 4.4 mEq/L (ref 3.5–5.1)
SODIUM: 137 meq/L (ref 136–145)
Total Bilirubin: 1.34 mg/dL — ABNORMAL HIGH (ref 0.20–1.20)
Total Protein: 7.1 g/dL (ref 6.4–8.3)

## 2016-03-09 ENCOUNTER — Encounter: Payer: Self-pay | Admitting: Oncology

## 2016-03-09 LAB — PSA: Prostate Specific Ag, Serum: 3.8 ng/mL (ref 0.0–4.0)

## 2016-03-09 LAB — TESTOSTERONE, FREE, TOTAL, SHBG
Sex Horm Binding Glob, Serum: 151 nmol/L — ABNORMAL HIGH (ref 19.3–76.4)
TESTOSTERONE FREE: 2.3 pg/mL — AB (ref 6.6–18.1)
TESTOSTERONE: 108 ng/dL — AB (ref 264–916)

## 2016-03-14 NOTE — Progress Notes (Signed)
ID: Kurt Elliott   DOB: 12/13/1920  MR#: [id number]  [id number]  PCP: Renato Shin, MD GYN: SU:  OTHER MD: Germain Osgood [phone]); Franchot Gallo; Lavonna Monarch; Daryel November; Erskine Emery; Arloa Koh, Ileene Hutchinson [facility]  CHIEF COMPLAINT: Stage IV prostate cancer  CURRENT TREATMENT: lupron intermittently  HISTORY OF PROSTATE CANCER:  From the original intake note:  Mr. Easterly tells me his PSA and prostate had been followed for a very long time.  His PSA was approximately 15 about a year ago [2007], then about 6 months ago it went to 43, which means that it had just about doubled in 6 months.  Of course, that raised red flags and the patient had his prostate biopsied at [facility] under Dr. Gareth Eagle 08/02/2006.  This showed a Gleason 9 (4+5) adenocarcinoma in 5 of the 6 cores, the other core showing a Gleason 8 (3+5).  In short, an aggressive tumor.  The patient had a bone scan 08/10/2006, which was negative, and CTs of the abdomen and pelvis 08/15/2006, which showed an enlarged prostate at 5.7 x 5.1 cm, but no evidence of adenopathy or extracapsular spread; no obvious invasion of the rectum or the bladder; and negative bone windows.  There was incidental nodularity around the left perirenal fat of unclear significance.   The patient was started on Casodex for about a month before receiving his first Lupron shot 09/05/2006.  He had a second Lupron shot 09/09/2006.  His PSA responded well, and his subsequent history is as detailed below.  INTERVAL HISTORY: Kurt Elliott returns today for followup of his stage IV prostate cancer accompanied by his wife Rod Holler. Since his last visit here he had a fall and hit his head restarting his in his scalp laceration. He also had a brief admission for aspiration pneumonia. He is still receiving physical therapy at home, he says.  Keante and Rod Holler or thinking of moving to [facility], which is where there son-in-law, our colleague Alben Spittle, will be starting a  practice   REVIEW OF SYSTEMS: Kurt Elliott denies any pain, fever, or bleeding. He still has some bruising from the recent fall. There have been no headaches, visual changes, nausea, or vomiting. He does not have a lot of energy and spends a good deal of the time sitting, he says. He is not uncomfortable however. He continues to have a very positive outlook. A detailed review of systems today was otherwise stable  Past Medical History:  Diagnosis Date  . Adenocarcinoma of prostate ([place])   . Atheroma    no penetrating ulcer, Abdominal aorta  . CAD (coronary artery disease), native coronary artery    a. cath 2008  b. cath 10/21/2014 3v dzs w/ patent RCA stent, severe stenosis of mid LCx/OM1 s/p DES, moderately severe diffuse mid LAD treated with DES, patent prox LAD stent;  c. 01/2015 Cath: patent LAD, LCX, OM, and RCA stents->Med Rx.  . Carotid artery disease ([place])    Doppler, 2006, minimal disease  //  Doppler, May, 2012, mild increase velocities, 40-59% bilateral stenoses  . Cellulitis, leg    left  . Cough    ACE Inhibitor, tolerates ARB  . Diverticulum   . Esophageal stricture   . GERD (gastroesophageal reflux disease)   . Hx of colonic polyps   . Hyperglycemia   . Hyperlipemia   . Hypertension   . Hyperthyroidism   . Incisional hernia   . Inferior mesenteric artery injury    High-grade stenosis, CT scan, 2006  .  Mild aortic stenosis    a. 09/2014 Echo: EF 65-70%, Gr 1 DD, no rwma, mild AS;  b. 01/2015 Echo: EF 65-70%, mod LVH, no rwma, 78mmHg intracavitary gradient, mildly dil LA.  . Pulmonary nodule, right    28-Oct-2006 / unchanged CT scan, July, 2009  . Rash    Ticlid  . Renal artery stenosis ([facility])    Mild  . Renal cyst    Hyperdense cyst lower pole left kidney  . Spinal stenosis   . Unspecified disorder of liver   . Vertigo    PAST SURGICAL HISTORY: Past Surgical History:  Procedure Laterality Date  . CARDIAC CATHETERIZATION    . CARDIAC CATHETERIZATION N/A 02/18/2015    Procedure: Right/Left Heart Cath and Coronary Angiography;  Surgeon: Leonie Man, MD;  Location: [facility];  Service: Cardiovascular;  Laterality: N/A;  . ESOPHAGOGASTRODUODENOSCOPY    . INCISIONAL HERNIA REPAIR    . LEFT HEART CATHETERIZATION WITH CORONARY ANGIOGRAM N/A 10/21/2014   Procedure: LEFT HEART CATHETERIZATION WITH CORONARY ANGIOGRAM;  Surgeon: Sherren Mocha, MD;  Location: [facility] CATH LAB;  Service: Cardiovascular;  Laterality: N/A;  . PERCUTANEOUS CORONARY STENT INTERVENTION (PCI-S)  10/21/2014   Procedure: PERCUTANEOUS CORONARY STENT INTERVENTION (PCI-S);  Surgeon: Sherren Mocha, MD;  Location: [facility] CATH LAB;  Service: Cardiovascular;;  Mid LAD and OM1  . SIGMOID RESECTION / RECTOPEXY      FAMILY HISTORY Family History  Problem Relation Age of Onset  . Pemphigus vulgaris Mother     died at 58  . Hypertension Mother   . Heart attack Father   . Hypertension Father   . Stroke Father   . Breast cancer Sister     2 sisters  . Hypertension Sister   . Hypertension Brother   The patient's father died at the age of 40 from an embolus.  The patient's mother died at the age of 71 in the setting of dementia.  The patient had 1 sister die with breast cancer (neglected), 1 sister died with diabetes, and 1 brother with Parkinson's disease.  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  He has been married 60+ years to Rod Holler, who used to be a Firefighter.  They have 4 children; 2 of them are Urologists in [place] (married to twin sisters).  In particular, they would like me to contact Aaron Edelman [phone] is the fax #) with any change in plans.  One of the sons, the oldest one, was a [place] in [place] (died in 27-Oct-2012 and from non-Hodgkin's lymphoma) and then the daughter, Clarise Cruz, is our Dr. Rosanna Randy wife. The patient has a total of 12 grandchildren and is waiting on great-grandchildren.   ADVANCED DIRECTIVES: in place  HEALTH MAINTENANCE: Social History  Substance  Use Topics  . Smoking status: Never Smoker  . Smokeless tobacco: Never Used  . Alcohol use No     Colonoscopy: 10/28/03  Bone density:  Lipid panel:  Allergies  Allergen Reactions  . Isosorbide Nitrate Other (See Comments)    "severe Headache" per patient  . Lisinopril Cough  . Ticlopidine Hcl Rash    Current Outpatient Prescriptions  Medication Sig Dispense Refill  . amLODipine (NORVASC) 5 MG tablet Take 1 tablet (5 mg total) by mouth daily. 30 tablet 1  . aspirin EC 81 MG tablet Take 81 mg by mouth daily.    . clopidogrel (PLAVIX) 75 MG tablet TAKE 1 TABLET (75 MG TOTAL) BY MOUTH DAILY WITH BREAKFAST. 90 tablet 2  .  diazepam (VALIUM) 2 MG tablet Take 0.5 tablets (1 mg total) by mouth at bedtime as needed (sleep). 30 tablet 0  . fluocinolone (SYNALAR) 0.01 % external solution APPLY TO AFFECTED EAR DAILY  1  . guaiFENesin-dextromethorphan (ROBITUSSIN DM) 100-10 MG/5ML syrup Take 5 mLs by mouth every 4 (four) hours as needed for cough. 118 mL 3  . Leuprolide Acetate (LUPRON DEPOT IM) Inject into the muscle as directed. Done at [place] office approx once every 3 months    . methimazole (TAPAZOLE) 5 MG tablet Take 1 tablet (5 mg total) by mouth 3 (three) times a week. 15 tablet 5  . metoprolol succinate (TOPROL-XL) 25 MG 24 hr tablet Take 0.5 tablets (12.5 mg total) by mouth daily. 15 tablet 11  . nitroGLYCERIN (NITROSTAT) 0.4 MG SL tablet Place 1 tablet (0.4 mg total) under the tongue every 5 (five) minutes as needed for chest pain. 25 tablet 5  . pantoprazole (PROTONIX) 40 MG tablet TAKE 1 TABLET (40 MG TOTAL) BY MOUTH DAILY. 90 tablet 2   No current facility-administered medications for this visit.     OBJECTIVE: Kurt Elliott using a chair-walker Vitals:   03/15/16 1412  BP: (!) 159/64  Pulse: 72  Resp: 17  Temp: 97.6 F (36.4 C)     Body mass index is 21.84 kg/m.    ECOG FS: 2  Sclerae unicteric, pupils round and equal Oropharynx clear --no thrush or other  lesions No cervical or supraclavicular adenopathy Lungs no rales or rhonchi Heart regular rate and rhythm Abd soft, nontender, positive bowel sounds MSK no focal spinal tenderness to vigorous percussion  Neuro: nonfocal, well oriented, appropriate affect  LAB RESULTS: Lab Results  Component Value Date   WBC 5.3 03/08/2016   NEUTROABS 3.7 03/08/2016   HGB 10.8 (L) 03/08/2016   HCT 30.9 (L) 03/08/2016   MCV 92.5 03/08/2016   PLT 154 03/08/2016      Results for Kurt Elliott, Kurt Elliott (MRN [id number]) as of 03/15/2016 14:55  Ref. Range 12/03/2014 11:14 02/05/2015 09:42 05/30/2015 10:48 12/16/2015 12:48 03/08/2016 13:47  PSA Latest Ref Range: 0.0 - 4.0 ng/mL 0.79 0.73 1.05 4.2 (H) 3.8   Results for Kurt Elliott, Kurt Elliott (MRN [id number]) as of 03/15/2016 14:55  Ref. Range 12/03/2014 11:14 02/05/2015 09:42 05/30/2015 10:48 12/16/2015 12:48 03/08/2016 13:47  Testosterone Latest Ref Range: 264 - 916 ng/dL 84 (L) 24.89 (L) 38 (L) 70 (L) 108 (L)     Chemistry      Component Value Date/Time   NA 137 03/08/2016 1347   K 4.4 03/08/2016 1347   CL 106 12/02/2015 1509   CL 107 01/02/2013 1059   CO2 24 03/08/2016 1347   BUN 21.1 03/08/2016 1347   CREATININE 1.0 03/08/2016 1347      Component Value Date/Time   CALCIUM 9.9 03/08/2016 1347   ALKPHOS 111 03/08/2016 1347   AST 38 (H) 03/08/2016 1347   ALT 21 03/08/2016 1347   BILITOT 1.34 (H) 03/08/2016 1347      No results found for: LABCA2  No components found for: LABCA125  No results for input(s): INR in the last 168 hours.  Urinalysis    Component Value Date/Time   COLORURINE YELLOW 03/03/2016 [place] 03/03/2016 0135   LABSPEC 1.023 03/03/2016 0135   PHURINE 7.5 03/03/2016 0135   GLUCOSEU NEGATIVE 03/03/2016 0135   GLUCOSEU NEGATIVE 12/02/2015 1525   HGBUR NEGATIVE 03/03/2016 [place] NEGATIVE 03/03/2016 0135   KETONESUR NEGATIVE 03/03/2016 0135  PROTEINUR NEGATIVE 03/03/2016 0135   UROBILINOGEN 2.0 (A)  12/02/2015 1525   NITRITE NEGATIVE 03/03/2016 0135   LEUKOCYTESUR NEGATIVE 03/03/2016 0135    STUDIES: Ct Head Wo Contrast  Result Date: 03/03/2016 CLINICAL DATA:  Fall getting out of car striking head on concrete driveway. Laceration. Now with headache. EXAM: CT HEAD WITHOUT CONTRAST CT CERVICAL SPINE WITHOUT CONTRAST TECHNIQUE: Multidetector CT imaging of the head and cervical spine was performed following the standard protocol without intravenous contrast. Multiplanar CT image reconstructions of the cervical spine were also generated. COMPARISON:  None. FINDINGS: CT HEAD FINDINGS Right occipital scalp hematoma without calvarial fracture. There is no acute intracranial hemorrhage. No subdural or extra-axial fluid collection. Generalized atrophy and mild chronic small vessel ischemic change. There is complete opacification of the frontal sinuses, moderate mucosal thickening throughout the remaining sinuses. Mastoid air cells are well aerated. CT CERVICAL SPINE FINDINGS No fracture or acute subluxation. The dens is intact. No jumped or perched facets. Multilevel degenerative disc disease and facet arthropathy. Vertebral body heights are maintained. There is an 11 mm sclerotic focus within T2 vertebra, not present on chest CT 09/14/2007. No prevertebral soft tissue edema. IMPRESSION: 1. Right parietal-occipital scalp hematoma. No fracture or acute intracranial abnormality. 2. No acute fracture or subluxation in the cervical spine. 3. Sclerotic 11 mm focus within T2 vertebra. This is nonspecific, however was not present on chest CT 09/14/2007. If patient has a history of malignancy, sclerotic metastatic disease is not excluded. A bone scan may be helpful for further evaluation. Electronically Signed   By: Jeb Levering M.D.   On: 03/03/2016 04:15   Ct Cervical Spine Wo Contrast  Result Date: 03/03/2016 CLINICAL DATA:  Fall getting out of car striking head on concrete driveway. Laceration. Now with  headache. EXAM: CT HEAD WITHOUT CONTRAST CT CERVICAL SPINE WITHOUT CONTRAST TECHNIQUE: Multidetector CT imaging of the head and cervical spine was performed following the standard protocol without intravenous contrast. Multiplanar CT image reconstructions of the cervical spine were also generated. COMPARISON:  None. FINDINGS: CT HEAD FINDINGS Right occipital scalp hematoma without calvarial fracture. There is no acute intracranial hemorrhage. No subdural or extra-axial fluid collection. Generalized atrophy and mild chronic small vessel ischemic change. There is complete opacification of the frontal sinuses, moderate mucosal thickening throughout the remaining sinuses. Mastoid air cells are well aerated. CT CERVICAL SPINE FINDINGS No fracture or acute subluxation. The dens is intact. No jumped or perched facets. Multilevel degenerative disc disease and facet arthropathy. Vertebral body heights are maintained. There is an 11 mm sclerotic focus within T2 vertebra, not present on chest CT 09/14/2007. No prevertebral soft tissue edema. IMPRESSION: 1. Right parietal-occipital scalp hematoma. No fracture or acute intracranial abnormality. 2. No acute fracture or subluxation in the cervical spine. 3. Sclerotic 11 mm focus within T2 vertebra. This is nonspecific, however was not present on chest CT 09/14/2007. If patient has a history of malignancy, sclerotic metastatic disease is not excluded. A bone scan may be helpful for further evaluation. Electronically Signed   By: Jeb Levering M.D.   On: 03/03/2016 04:15    ASSESSMENT: 82 y.o.  [place] Elliott with a history of prostate cancer initially biopsied January 2008, Gleason 4+5, with a rapid doubling time, treated initially with Casodex and Lupron with an excellent PSA response, status post radiation to 78 Gy given with curative intent completed October 2008.  Off Lupron as of December 2008.  (1) With the eventual development of right hip pain and a rapid  PSA  doubling time, he resumed Lupron and Casodex September 2011 with the Casodex stopped after a month.  He receives Lupron intermittently, depending on his PSA and testosterone levels, most recent dose 06/03/2015  PLAN:  Cian whether his aspiration pneumonia and fall. He and Rod Holler are looking forward to moving to [facility] probably late October or November. They will ask their son-in-law to locate a good medical oncologist there and we will past that time and at that time  Today we reviewed his prostate studies. His PSA actually went down slightly. It is basically stable. His testosterone of course continues to rise and it is now just about 100. This is still less than half the low normal.  this sclerotic focus at T2 noted and recent films is nonspecific. At this point, since he does get significantly symptomatic from Lupron and are ready has a very limited functional status, we are continuing observation instead of resuming treatment.  Today we discussed how the cerebellum works and Y as we get a little older we get a little bit more tremulous. We also discussed how to eat in a way that minimizes the risk of aspiration. Finally I emphasized as everyone else has been doing that he really does need to use his seat walker wherever he goes. He understands he is at high fall risk.  I have made him a tentative return appointment here for early October, but we can move that up or back as needed depending on his move date.  A note of call for any problems that may develop before the next visit here.  Fermina Mishkin C    03/15/2016

## 2016-03-15 ENCOUNTER — Other Ambulatory Visit: Payer: Medicare Other

## 2016-03-15 ENCOUNTER — Ambulatory Visit (HOSPITAL_BASED_OUTPATIENT_CLINIC_OR_DEPARTMENT_OTHER): Payer: Medicare Other | Admitting: Oncology

## 2016-03-15 ENCOUNTER — Telehealth: Payer: Self-pay | Admitting: Endocrinology

## 2016-03-15 VITALS — BP 159/64 | HR 72 | Temp 97.6°F | Resp 17 | Ht 70.0 in | Wt 152.2 lb

## 2016-03-15 DIAGNOSIS — C61 Malignant neoplasm of prostate: Secondary | ICD-10-CM | POA: Diagnosis not present

## 2016-03-15 NOTE — Telephone Encounter (Signed)
ok 

## 2016-03-15 NOTE — Telephone Encounter (Signed)
Herbert Deaner (PT with [facility]) called and said that PT declined physical therapy this week due to his regular physical therapist being out of town.  Clair Gulling needs a new order to make up pt's physical therapy. CB # [phone]

## 2016-03-15 NOTE — Telephone Encounter (Signed)
Please advise, Thanks!  

## 2016-03-16 ENCOUNTER — Telehealth: Payer: Self-pay | Admitting: Oncology

## 2016-03-16 NOTE — Telephone Encounter (Signed)
I contacted Clair Gulling with [facility] and advised Dr. Loanne Drilling has given the ok for physical therapy to resume.

## 2016-03-16 NOTE — Telephone Encounter (Signed)
appt made and letter sent by mail 8/22

## 2016-03-19 ENCOUNTER — Other Ambulatory Visit: Payer: Self-pay | Admitting: Endocrinology

## 2016-03-19 DIAGNOSIS — I6523 Occlusion and stenosis of bilateral carotid arteries: Secondary | ICD-10-CM

## 2016-03-23 DIAGNOSIS — D649 Anemia, unspecified: Secondary | ICD-10-CM | POA: Diagnosis not present

## 2016-03-23 DIAGNOSIS — I2510 Atherosclerotic heart disease of native coronary artery without angina pectoris: Secondary | ICD-10-CM | POA: Diagnosis not present

## 2016-03-23 DIAGNOSIS — G250 Essential tremor: Secondary | ICD-10-CM | POA: Diagnosis not present

## 2016-03-23 DIAGNOSIS — K219 Gastro-esophageal reflux disease without esophagitis: Secondary | ICD-10-CM | POA: Diagnosis not present

## 2016-03-23 DIAGNOSIS — N183 Chronic kidney disease, stage 3 (moderate): Secondary | ICD-10-CM | POA: Diagnosis not present

## 2016-03-23 DIAGNOSIS — I129 Hypertensive chronic kidney disease with stage 1 through stage 4 chronic kidney disease, or unspecified chronic kidney disease: Secondary | ICD-10-CM | POA: Diagnosis not present

## 2016-03-24 ENCOUNTER — Telehealth: Payer: Self-pay | Admitting: Endocrinology

## 2016-03-24 ENCOUNTER — Ambulatory Visit (HOSPITAL_COMMUNITY)
Admission: RE | Admit: 2016-03-24 | Discharge: 2016-03-24 | Disposition: A | Discharge status: Home / Self Care | Payer: Medicare Other | Source: Ambulatory Visit | Attending: Cardiology | Admitting: Cardiology

## 2016-03-24 ENCOUNTER — Inpatient Hospital Stay (HOSPITAL_COMMUNITY): Admission: RE | Admit: 2016-03-24 | Payer: Medicare Other | Source: Ambulatory Visit

## 2016-03-24 DIAGNOSIS — R0989 Other specified symptoms and signs involving the circulatory and respiratory systems: Secondary | ICD-10-CM | POA: Diagnosis not present

## 2016-03-24 DIAGNOSIS — I2510 Atherosclerotic heart disease of native coronary artery without angina pectoris: Secondary | ICD-10-CM | POA: Insufficient documentation

## 2016-03-24 DIAGNOSIS — I10 Essential (primary) hypertension: Secondary | ICD-10-CM | POA: Insufficient documentation

## 2016-03-24 DIAGNOSIS — E785 Hyperlipidemia, unspecified: Secondary | ICD-10-CM | POA: Insufficient documentation

## 2016-03-24 DIAGNOSIS — I6523 Occlusion and stenosis of bilateral carotid arteries: Secondary | ICD-10-CM | POA: Insufficient documentation

## 2016-03-24 DIAGNOSIS — I739 Peripheral vascular disease, unspecified: Secondary | ICD-10-CM | POA: Insufficient documentation

## 2016-03-24 NOTE — Telephone Encounter (Signed)
PTherapist calling Dr. Loanne Drilling to see if we can extend his therapy due to balance issues  [phone]

## 2016-03-24 NOTE — Telephone Encounter (Signed)
ok 

## 2016-03-24 NOTE — Telephone Encounter (Signed)
See message and please advise, Thanks!  

## 2016-03-25 ENCOUNTER — Telehealth: Payer: Self-pay | Admitting: Cardiovascular Disease

## 2016-03-25 DIAGNOSIS — I350 Nonrheumatic aortic (valve) stenosis: Secondary | ICD-10-CM

## 2016-03-25 NOTE — Telephone Encounter (Signed)
I contacted [facility] physical therapist and advise ok per Dr. Loanne Drilling to extend physical therapy.

## 2016-03-25 NOTE — Telephone Encounter (Signed)
Appointments scheduled on 04/19/2016.

## 2016-03-25 NOTE — Telephone Encounter (Signed)
Kurt Elliott is calling because they are moving in October and Mr. Piehler needs to see Dr. Burt Knack before they move. They are asking to come on 09/25-09/27

## 2016-03-25 NOTE — Telephone Encounter (Signed)
Pt is due to have echo and OV scheduled.

## 2016-04-09 ENCOUNTER — Telehealth: Payer: Self-pay | Admitting: Endocrinology

## 2016-04-09 NOTE — Telephone Encounter (Signed)
See note to be advised.  

## 2016-04-09 NOTE — Telephone Encounter (Signed)
Clair Gulling with [facility] calling pt was cancelled today due to Ms Moncada's appt here, next appt is 9/18

## 2016-04-12 DIAGNOSIS — N183 Chronic kidney disease, stage 3 (moderate): Secondary | ICD-10-CM | POA: Diagnosis not present

## 2016-04-12 DIAGNOSIS — I2510 Atherosclerotic heart disease of native coronary artery without angina pectoris: Secondary | ICD-10-CM | POA: Diagnosis not present

## 2016-04-12 DIAGNOSIS — G250 Essential tremor: Secondary | ICD-10-CM | POA: Diagnosis not present

## 2016-04-12 DIAGNOSIS — I129 Hypertensive chronic kidney disease with stage 1 through stage 4 chronic kidney disease, or unspecified chronic kidney disease: Secondary | ICD-10-CM | POA: Diagnosis not present

## 2016-04-12 DIAGNOSIS — D649 Anemia, unspecified: Secondary | ICD-10-CM | POA: Diagnosis not present

## 2016-04-12 DIAGNOSIS — K219 Gastro-esophageal reflux disease without esophagitis: Secondary | ICD-10-CM | POA: Diagnosis not present

## 2016-04-14 ENCOUNTER — Telehealth: Payer: Self-pay | Admitting: Endocrinology

## 2016-04-14 DIAGNOSIS — G250 Essential tremor: Secondary | ICD-10-CM | POA: Diagnosis not present

## 2016-04-14 DIAGNOSIS — N183 Chronic kidney disease, stage 3 (moderate): Secondary | ICD-10-CM | POA: Diagnosis not present

## 2016-04-14 DIAGNOSIS — I2510 Atherosclerotic heart disease of native coronary artery without angina pectoris: Secondary | ICD-10-CM | POA: Diagnosis not present

## 2016-04-14 DIAGNOSIS — D649 Anemia, unspecified: Secondary | ICD-10-CM | POA: Diagnosis not present

## 2016-04-14 DIAGNOSIS — K219 Gastro-esophageal reflux disease without esophagitis: Secondary | ICD-10-CM | POA: Diagnosis not present

## 2016-04-14 DIAGNOSIS — I129 Hypertensive chronic kidney disease with stage 1 through stage 4 chronic kidney disease, or unspecified chronic kidney disease: Secondary | ICD-10-CM | POA: Diagnosis not present

## 2016-04-14 NOTE — Telephone Encounter (Signed)
Kurt Elliott PT from  [facility] patient was discharged  today because of moving out of town,and wife in the Hospital. He need a verbal order 2x a week for three weeks for fall risk reduction. [phone]

## 2016-04-14 NOTE — Telephone Encounter (Signed)
ok 

## 2016-04-14 NOTE — Telephone Encounter (Signed)
See message and please advise, Thanks!  

## 2016-04-14 NOTE — Telephone Encounter (Signed)
I contacted Clair Gulling and advised via voicemail of the verbal ok for physical therapy was given by Dr. Loanne Drilling.

## 2016-04-16 ENCOUNTER — Encounter: Payer: Self-pay | Admitting: Cardiovascular Disease

## 2016-04-17 DIAGNOSIS — Z955 Presence of coronary angioplasty implant and graft: Secondary | ICD-10-CM | POA: Diagnosis not present

## 2016-04-17 DIAGNOSIS — K219 Gastro-esophageal reflux disease without esophagitis: Secondary | ICD-10-CM | POA: Diagnosis not present

## 2016-04-17 DIAGNOSIS — Z8546 Personal history of malignant neoplasm of prostate: Secondary | ICD-10-CM | POA: Diagnosis not present

## 2016-04-17 DIAGNOSIS — Z9181 History of falling: Secondary | ICD-10-CM | POA: Diagnosis not present

## 2016-04-17 DIAGNOSIS — G250 Essential tremor: Secondary | ICD-10-CM | POA: Diagnosis not present

## 2016-04-17 DIAGNOSIS — E785 Hyperlipidemia, unspecified: Secondary | ICD-10-CM | POA: Diagnosis not present

## 2016-04-17 DIAGNOSIS — N183 Chronic kidney disease, stage 3 (moderate): Secondary | ICD-10-CM | POA: Diagnosis not present

## 2016-04-17 DIAGNOSIS — D649 Anemia, unspecified: Secondary | ICD-10-CM | POA: Diagnosis not present

## 2016-04-17 DIAGNOSIS — I129 Hypertensive chronic kidney disease with stage 1 through stage 4 chronic kidney disease, or unspecified chronic kidney disease: Secondary | ICD-10-CM | POA: Diagnosis not present

## 2016-04-17 DIAGNOSIS — I2510 Atherosclerotic heart disease of native coronary artery without angina pectoris: Secondary | ICD-10-CM | POA: Diagnosis not present

## 2016-04-19 ENCOUNTER — Telehealth: Payer: Self-pay | Admitting: *Deleted

## 2016-04-19 ENCOUNTER — Ambulatory Visit (INDEPENDENT_AMBULATORY_CARE_PROVIDER_SITE_OTHER): Payer: Medicare Other | Admitting: Cardiovascular Disease

## 2016-04-19 ENCOUNTER — Encounter: Payer: Self-pay | Admitting: Cardiovascular Disease

## 2016-04-19 ENCOUNTER — Ambulatory Visit (HOSPITAL_COMMUNITY): Payer: Medicare Other

## 2016-04-19 ENCOUNTER — Other Ambulatory Visit: Payer: Self-pay | Admitting: Oncology

## 2016-04-19 VITALS — BP 140/60 | HR 68 | Ht 70.0 in | Wt 151.1 lb

## 2016-04-19 DIAGNOSIS — I6523 Occlusion and stenosis of bilateral carotid arteries: Secondary | ICD-10-CM

## 2016-04-19 DIAGNOSIS — I350 Nonrheumatic aortic (valve) stenosis: Secondary | ICD-10-CM

## 2016-04-19 NOTE — Progress Notes (Signed)
Cardiology Office Note Date:  04/19/2016   ID:  Kurt Elliott, DOB [birth year], MRN [id number]  PCP:  Renato Shin, MD  Cardiologist:  Sherren Mocha, MD    Chief Complaint  Patient presents with  . Coronary Artery Disease   History of Present Illness: Kurt Elliott is a 82 y.o. male who presents for follow-up evaluation.   He's followed for CAD s/p PCI in 2016, HTN, hyperlipidemia, and mild aortic stenosis.   He's here with his son who is a urologist today. The patient is going to move to [facility] next month. He is doing fine and denies any cardiac symptoms at this time. He specifically denies chest pain, chest pressure, shortness of breath, leg swelling, or heart palpitations. He complains of generalized weakness.  Past Medical History:  Diagnosis Date  . Adenocarcinoma of prostate ([place])   . Atheroma    no penetrating ulcer, Abdominal aorta  . CAD (coronary artery disease), native coronary artery    a. cath 2008  b. cath 10/21/2014 3v dzs w/ patent RCA stent, severe stenosis of mid LCx/OM1 s/p DES, moderately severe diffuse mid LAD treated with DES, patent prox LAD stent;  c. 01/2015 Cath: patent LAD, LCX, OM, and RCA stents->Med Rx.  . Carotid artery disease ([place])    Doppler, 2006, minimal disease  //  Doppler, May, 2012, mild increase velocities, 40-59% bilateral stenoses  . Cellulitis, leg    left  . Cough    ACE Inhibitor, tolerates ARB  . Diverticulum   . Esophageal stricture   . GERD (gastroesophageal reflux disease)   . Hx of colonic polyps   . Hyperglycemia   . Hyperlipemia   . Hypertension   . Hyperthyroidism   . Incisional hernia   . Inferior mesenteric artery injury    High-grade stenosis, CT scan, 2006  . Mild aortic stenosis    a. 09/2014 Echo: EF 65-70%, Gr 1 DD, no rwma, mild AS;  b. 01/2015 Echo: EF 65-70%, mod LVH, no rwma, 53mmHg intracavitary gradient, mildly dil LA.  . Pulmonary nodule, right    2008 / unchanged CT scan, July, 2009  . Rash      Ticlid  . Renal artery stenosis ([facility])    Mild  . Renal cyst    Hyperdense cyst lower pole left kidney  . Spinal stenosis   . Unspecified disorder of liver   . Vertigo     Past Surgical History:  Procedure Laterality Date  . CARDIAC CATHETERIZATION    . CARDIAC CATHETERIZATION N/A 02/18/2015   Procedure: Right/Left Heart Cath and Coronary Angiography;  Surgeon: Leonie Man, MD;  Location: [facility];  Service: Cardiovascular;  Laterality: N/A;  . ESOPHAGOGASTRODUODENOSCOPY    . INCISIONAL HERNIA REPAIR    . LEFT HEART CATHETERIZATION WITH CORONARY ANGIOGRAM N/A 10/21/2014   Procedure: LEFT HEART CATHETERIZATION WITH CORONARY ANGIOGRAM;  Surgeon: Sherren Mocha, MD;  Location: [facility] CATH LAB;  Service: Cardiovascular;  Laterality: N/A;  . PERCUTANEOUS CORONARY STENT INTERVENTION (PCI-S)  10/21/2014   Procedure: PERCUTANEOUS CORONARY STENT INTERVENTION (PCI-S);  Surgeon: Sherren Mocha, MD;  Location: [facility] CATH LAB;  Service: Cardiovascular;;  Mid LAD and OM1  . SIGMOID RESECTION / RECTOPEXY      Current Outpatient Prescriptions  Medication Sig Dispense Refill  . amLODipine (NORVASC) 5 MG tablet Take 1 tablet (5 mg total) by mouth daily. 30 tablet 1  . aspirin EC 81 MG tablet Take 81 mg by mouth daily.    Marland Kitchen  dabigatran (PRADAXA) 75 MG CAPS capsule Take 75 mg by mouth daily.    . diazepam (VALIUM) 2 MG tablet Take 0.5 tablets (1 mg total) by mouth at bedtime as needed (sleep). 30 tablet 0  . fluocinolone (SYNALAR) 0.01 % external solution APPLY TO AFFECTED EAR DAILY  1  . guaiFENesin-dextromethorphan (ROBITUSSIN DM) 100-10 MG/5ML syrup Take 5 mLs by mouth every 4 (four) hours as needed for cough. 118 mL 3  . Leuprolide Acetate (LUPRON DEPOT IM) Inject into the muscle as directed. Done at [place] office approx once every 3 months    . methimazole (TAPAZOLE) 5 MG tablet Take 1 tablet (5 mg total) by mouth 3 (three) times a week. 15 tablet 5  . metoprolol succinate (TOPROL-XL) 25 MG  24 hr tablet Take 0.5 tablets (12.5 mg total) by mouth daily. 15 tablet 11  . nitroGLYCERIN (NITROSTAT) 0.4 MG SL tablet Place 1 tablet (0.4 mg total) under the tongue every 5 (five) minutes as needed for chest pain. 25 tablet 5  . pantoprazole (PROTONIX) 40 MG tablet TAKE 1 TABLET (40 MG TOTAL) BY MOUTH DAILY. 90 tablet 2  . traZODone (DESYREL) 50 MG tablet Take 25 mg by mouth at bedtime.     No current facility-administered medications for this visit.     Allergies:   Isosorbide nitrate; Lisinopril; and Ticlopidine hcl   Social History:  The patient  reports that he has never smoked. He has never used smokeless tobacco. He reports that he does not drink alcohol or use drugs.   Family History:  The patient's  family history includes Breast cancer in his sister; Heart attack in his father; Hypertension in his brother, father, mother, and sister; Pemphigus vulgaris in his mother; Stroke in his father.    ROS:  Please see the history of present illness.  Otherwise, review of systems is positive for leg weakness.  All other systems are reviewed and negative.    PHYSICAL EXAM: VS:  BP 140/60   Pulse 68   Ht 5\' 10"  (1.778 m)   Wt 151 lb 1.9 oz (68.5 kg)   BMI 21.68 kg/m  , BMI Body mass index is 21.68 kg/m. GEN: Elderly male, frail-appearing, in no acute distress  HEENT: normal  Neck: no JVD, no masses. No carotid bruits Cardiac: RRR with 2/6 mid-systolic murmur at the LSB Respiratory:  clear to auscultation bilaterally, normal work of breathing GI: soft, nontender, nondistended, + BS MS: no deformity or atrophy  Ext: no pretibial edema, pedal pulses 2+= bilaterally Skin: warm and dry, no rash Neuro:  Strength and sensation are intact Psych: euthymic mood, full affect  EKG:  EKG is not ordered today.  Recent Labs: 09/22/2015: Magnesium 1.8 12/02/2015: TSH 3.77 03/08/2016: ALT 21; BUN 21.1; Creatinine 1.0; HGB 10.8; Platelets 154; Potassium 4.4; Sodium 137   Lipid Panel       Component Value Date/Time   CHOL 149 02/18/2015 0416   TRIG 51 02/18/2015 0416   TRIG 137 05/23/2006 0817   HDL 43 02/18/2015 0416   CHOLHDL 3.5 02/18/2015 0416   VLDL 10 02/18/2015 0416   LDLCALC 96 02/18/2015 0416   LDLDIRECT 97.5 06/01/2011 1419      Wt Readings from Last 3 Encounters:  04/19/16 151 lb 1.9 oz (68.5 kg)  03/15/16 152 lb 3.2 oz (69 kg)  12/16/15 154 lb 11.2 oz (70.2 kg)     Cardiac Studies Reviewed: Echo 02-18-2015: Study Conclusions  - Left ventricle: There is an intracavitary gradient of  25mm/Hg.   The cavity size was normal. Wall thickness was increased in a   pattern of moderate LVH. Systolic function was vigorous. The   estimated ejection fraction was in the range of 65% to 70%. Wall   motion was normal; there were no regional wall motion   abnormalities. - Aortic valve: Thickened aortic valve. Opening is seen. The mean   and peak gradients are only mildly elevated. However, the LV   cavity size is small with vigorous motion. Also, there is an   intracavitary gradient of 94mmHg. There is no mitral SAM. There   is no LVOT gradient. The patient will be cathed today. All   hemodynamics will be obtained to rule out the slim chance of   NORMAL EF/LOW GRADIENT/SEVERE AS. However Stroke volume index of   65ml/M2 in echo lab seems too high for this diagnosis. Mean   gradient (S): 12 mm Hg. Peak gradient (S): 20 mm Hg. - Left atrium: The atrium was mildly dilated. - Right ventricle: The cavity size was mildly dilated. Systolic   function was normal.  ASSESSMENT AND PLAN: 1.  CAD, native vessel: No symptoms of angina. He is 18 months out from most recent PCI. Recommended that he stop Plavix and continue on aspirin 81 mg daily. I would be happy to see him back in the future, but he is relocating to [place] and will establish with a new cardiologist there.  2. Aortic stenosis, mild: The patient's last echo was reviewed. This will be updated with a repeat  study. He does not appear to have any symptoms at this time.  3. Hypertension: Continue combination of metoprolol and amlodipine. Blood pressure is well controlled.  Current medicines are reviewed with the patient today.  The patient does not have concerns regarding medicines.  Labs/ tests ordered today include:  No orders of the defined types were placed in this encounter.   Disposition:   FU prn  Signed, Sherren Mocha, MD  04/19/2016 5:48 PM    [address], [place], [place]  [address] Phone: [phone]; Fax: [phone]

## 2016-04-19 NOTE — Telephone Encounter (Signed)
Daughter called stating that patient needs to be scheduled for a lupron injection per MD Magrinat. RN does not see anything in patients records about restarting this medication. Information forwarded to MD [place] nurse for further evaluation and appointment set up.

## 2016-04-19 NOTE — Patient Instructions (Signed)
Medication Instructions:  Your physician has recommended you make the following change in your medication:  1. STOP Plavix (clopidogrel)  Labwork: No new orders.   Testing/Procedures: Your physician has requested that you have an echocardiogram. Echocardiography is a painless test that uses sound waves to create images of your heart. It provides your doctor with information about the size and shape of your heart and how well your heart's chambers and valves are working. This procedure takes approximately one hour. There are no restrictions for this procedure.  Follow-Up: Your physician recommends that you schedule a follow-up appointment as needed with Dr Burt Knack.    Any Other Special Instructions Will Be Listed Below (If Applicable).     If you need a refill on your cardiac medications before your next appointment, please call your pharmacy.

## 2016-04-19 NOTE — Telephone Encounter (Signed)
This RN called pt's dtr and discussed need for injection. [name] was unaware of MD appointment for 04/26/2016 - and injection can be given post MD appointment.  [name] stated above is appropriate- no other needs at this time.

## 2016-04-20 DIAGNOSIS — G250 Essential tremor: Secondary | ICD-10-CM | POA: Diagnosis not present

## 2016-04-20 DIAGNOSIS — D649 Anemia, unspecified: Secondary | ICD-10-CM | POA: Diagnosis not present

## 2016-04-20 DIAGNOSIS — I2510 Atherosclerotic heart disease of native coronary artery without angina pectoris: Secondary | ICD-10-CM | POA: Diagnosis not present

## 2016-04-20 DIAGNOSIS — K219 Gastro-esophageal reflux disease without esophagitis: Secondary | ICD-10-CM | POA: Diagnosis not present

## 2016-04-20 DIAGNOSIS — I129 Hypertensive chronic kidney disease with stage 1 through stage 4 chronic kidney disease, or unspecified chronic kidney disease: Secondary | ICD-10-CM | POA: Diagnosis not present

## 2016-04-20 DIAGNOSIS — N183 Chronic kidney disease, stage 3 (moderate): Secondary | ICD-10-CM | POA: Diagnosis not present

## 2016-04-21 DIAGNOSIS — H0014 Chalazion left upper eyelid: Secondary | ICD-10-CM | POA: Diagnosis not present

## 2016-04-22 ENCOUNTER — Telehealth: Payer: Self-pay | Admitting: Cardiovascular Disease

## 2016-04-22 DIAGNOSIS — I2510 Atherosclerotic heart disease of native coronary artery without angina pectoris: Secondary | ICD-10-CM | POA: Diagnosis not present

## 2016-04-22 DIAGNOSIS — N183 Chronic kidney disease, stage 3 (moderate): Secondary | ICD-10-CM | POA: Diagnosis not present

## 2016-04-22 DIAGNOSIS — I129 Hypertensive chronic kidney disease with stage 1 through stage 4 chronic kidney disease, or unspecified chronic kidney disease: Secondary | ICD-10-CM | POA: Diagnosis not present

## 2016-04-22 DIAGNOSIS — G250 Essential tremor: Secondary | ICD-10-CM | POA: Diagnosis not present

## 2016-04-22 DIAGNOSIS — D649 Anemia, unspecified: Secondary | ICD-10-CM | POA: Diagnosis not present

## 2016-04-22 DIAGNOSIS — K219 Gastro-esophageal reflux disease without esophagitis: Secondary | ICD-10-CM | POA: Diagnosis not present

## 2016-04-22 NOTE — Telephone Encounter (Signed)
Returned call to Clair Gulling and let him know it was stopped per 04/19/16 note

## 2016-04-22 NOTE — Telephone Encounter (Signed)
New message    Kurt Elliott is calling to confirm that the pt is was taken off of Plavix. Please call.

## 2016-04-26 ENCOUNTER — Ambulatory Visit (HOSPITAL_BASED_OUTPATIENT_CLINIC_OR_DEPARTMENT_OTHER): Payer: Medicare Other

## 2016-04-26 ENCOUNTER — Other Ambulatory Visit (HOSPITAL_BASED_OUTPATIENT_CLINIC_OR_DEPARTMENT_OTHER): Payer: Medicare Other

## 2016-04-26 ENCOUNTER — Ambulatory Visit (HOSPITAL_BASED_OUTPATIENT_CLINIC_OR_DEPARTMENT_OTHER): Payer: Medicare Other | Admitting: Oncology

## 2016-04-26 DIAGNOSIS — C61 Malignant neoplasm of prostate: Secondary | ICD-10-CM | POA: Diagnosis not present

## 2016-04-26 DIAGNOSIS — N183 Chronic kidney disease, stage 3 unspecified: Secondary | ICD-10-CM

## 2016-04-26 DIAGNOSIS — R3915 Urgency of urination: Secondary | ICD-10-CM

## 2016-04-26 DIAGNOSIS — M25551 Pain in right hip: Secondary | ICD-10-CM

## 2016-04-26 DIAGNOSIS — R54 Age-related physical debility: Secondary | ICD-10-CM

## 2016-04-26 DIAGNOSIS — Z5111 Encounter for antineoplastic chemotherapy: Secondary | ICD-10-CM

## 2016-04-26 DIAGNOSIS — M25559 Pain in unspecified hip: Secondary | ICD-10-CM

## 2016-04-26 DIAGNOSIS — M25552 Pain in left hip: Secondary | ICD-10-CM

## 2016-04-26 DIAGNOSIS — I739 Peripheral vascular disease, unspecified: Secondary | ICD-10-CM

## 2016-04-26 DIAGNOSIS — R358 Other polyuria: Secondary | ICD-10-CM

## 2016-04-26 DIAGNOSIS — R42 Dizziness and giddiness: Secondary | ICD-10-CM

## 2016-04-26 DIAGNOSIS — L89151 Pressure ulcer of sacral region, stage 1: Secondary | ICD-10-CM

## 2016-04-26 DIAGNOSIS — R2681 Unsteadiness on feet: Secondary | ICD-10-CM

## 2016-04-26 DIAGNOSIS — D649 Anemia, unspecified: Secondary | ICD-10-CM

## 2016-04-26 DIAGNOSIS — R0602 Shortness of breath: Secondary | ICD-10-CM

## 2016-04-26 DIAGNOSIS — R5382 Chronic fatigue, unspecified: Secondary | ICD-10-CM

## 2016-04-26 DIAGNOSIS — R3589 Other polyuria: Secondary | ICD-10-CM

## 2016-04-26 DIAGNOSIS — I779 Disorder of arteries and arterioles, unspecified: Secondary | ICD-10-CM

## 2016-04-26 LAB — CBC WITH DIFFERENTIAL/PLATELET
BASO%: 0.2 % (ref 0.0–2.0)
BASOS ABS: 0 10*3/uL (ref 0.0–0.1)
EOS ABS: 0.2 10*3/uL (ref 0.0–0.5)
EOS%: 2.9 % (ref 0.0–7.0)
HEMATOCRIT: 31.9 % — AB (ref 38.4–49.9)
HGB: 11.3 g/dL — ABNORMAL LOW (ref 13.0–17.1)
LYMPH#: 1.3 10*3/uL (ref 0.9–3.3)
LYMPH%: 20.1 % (ref 14.0–49.0)
MCH: 32.8 pg (ref 27.2–33.4)
MCHC: 35.4 g/dL (ref 32.0–36.0)
MCV: 92.7 fL (ref 79.3–98.0)
MONO#: 0.4 10*3/uL (ref 0.1–0.9)
MONO%: 6.1 % (ref 0.0–14.0)
NEUT#: 4.6 10*3/uL (ref 1.5–6.5)
NEUT%: 70.7 % (ref 39.0–75.0)
PLATELETS: 148 10*3/uL (ref 140–400)
RBC: 3.44 10*6/uL — ABNORMAL LOW (ref 4.20–5.82)
RDW: 13.2 % (ref 11.0–14.6)
WBC: 6.5 10*3/uL (ref 4.0–10.3)

## 2016-04-26 LAB — COMPREHENSIVE METABOLIC PANEL
ALBUMIN: 3.5 g/dL (ref 3.5–5.0)
ALK PHOS: 119 U/L (ref 40–150)
ALT: 23 U/L (ref 0–55)
ANION GAP: 8 meq/L (ref 3–11)
AST: 35 U/L — ABNORMAL HIGH (ref 5–34)
BUN: 18.9 mg/dL (ref 7.0–26.0)
CALCIUM: 9.7 mg/dL (ref 8.4–10.4)
CHLORIDE: 109 meq/L (ref 98–109)
CO2: 23 mEq/L (ref 22–29)
Creatinine: 1 mg/dL (ref 0.7–1.3)
EGFR: 64 mL/min/{1.73_m2} — ABNORMAL LOW (ref >90)
Glucose: 122 mg/dl (ref 70–140)
POTASSIUM: 4.2 meq/L (ref 3.5–5.1)
Sodium: 140 mEq/L (ref 136–145)
Total Bilirubin: 1.06 mg/dL (ref 0.20–1.20)
Total Protein: 6.7 g/dL (ref 6.4–8.3)

## 2016-04-26 MED ORDER — LEUPROLIDE ACETATE (3 MONTH) 22.5 MG IM KIT
22.5000 mg | PACK | Freq: Once | INTRAMUSCULAR | Status: AC
  Administered 2016-04-26: 22.5 mg via INTRAMUSCULAR
  Filled 2016-04-26: qty 22.5

## 2016-04-26 NOTE — Patient Instructions (Signed)
Leuprolide depot injection What is this medicine? LEUPROLIDE (loo PROE lide) is a man-made protein that acts like a natural hormone in the body. It decreases testosterone in men and decreases estrogen in women. In men, this medicine is used to treat advanced prostate cancer. In women, some forms of this medicine may be used to treat endometriosis, uterine fibroids, or other male hormone-related problems. This medicine may be used for other purposes; ask your health care provider or pharmacist if you have questions. What should I tell my health care provider before I take this medicine? They need to know if you have any of these conditions: -diabetes -heart disease or previous heart attack -high blood pressure -high cholesterol -osteoporosis -pain or difficulty passing urine -spinal cord metastasis -stroke -tobacco smoker -unusual vaginal bleeding (women) -an unusual or allergic reaction to leuprolide, benzyl alcohol, other medicines, foods, dyes, or preservatives -pregnant or trying to get pregnant -breast-feeding How should I use this medicine? This medicine is for injection into a muscle or for injection under the skin. It is given by a health care professional in a hospital or clinic setting. The specific product will determine how it will be given to you. Make sure you understand which product you receive and how often you will receive it. Talk to your pediatrician regarding the use of this medicine in children. Special care may be needed. Overdosage: If you think you have taken too much of this medicine contact a poison control center or emergency room at once. NOTE: This medicine is only for you. Do not share this medicine with others. What if I miss a dose? It is important not to miss a dose. Call your doctor or health care professional if you are unable to keep an appointment. Depot injections: Depot injections are given either once-monthly, every 12 weeks, every 16 weeks, or  every 24 weeks depending on the product you are prescribed. The product you are prescribed will be based on if you are male or male, and your condition. Make sure you understand your product and dosing. What may interact with this medicine? Do not take this medicine with any of the following medications: -chasteberry This medicine may also interact with the following medications: -herbal or dietary supplements, like black cohosh or DHEA -male hormones, like estrogens or progestins and birth control pills, patches, rings, or injections -male hormones, like testosterone This list may not describe all possible interactions. Give your health care provider a list of all the medicines, herbs, non-prescription drugs, or dietary supplements you use. Also tell them if you smoke, drink alcohol, or use illegal drugs. Some items may interact with your medicine. What should I watch for while using this medicine? Visit your doctor or health care professional for regular checks on your progress. During the first weeks of treatment, your symptoms may get worse, but then will improve as you continue your treatment. You may get hot flashes, increased bone pain, increased difficulty passing urine, or an aggravation of nerve symptoms. Discuss these effects with your doctor or health care professional, some of them may improve with continued use of this medicine. Male patients may experience a menstrual cycle or spotting during the first months of therapy with this medicine. If this continues, contact your doctor or health care professional. What side effects may I notice from receiving this medicine? Side effects that you should report to your doctor or health care professional as soon as possible: -allergic reactions like skin rash, itching or hives, swelling of the   face, lips, or tongue -breathing problems -chest pain -depression or memory disorders -pain in your legs or groin -pain at site where injected or  implanted -severe headache -swelling of the feet and legs -visual changes -vomiting Side effects that usually do not require medical attention (report to your doctor or health care professional if they continue or are bothersome): -breast swelling or tenderness -decrease in sex drive or performance -diarrhea -hot flashes -loss of appetite -muscle, joint, or bone pains -nausea -redness or irritation at site where injected or implanted -skin problems or acne This list may not describe all possible side effects. Call your doctor for medical advice about side effects. You may report side effects to FDA at 1-800-FDA-1088. Where should I keep my medicine? This drug is given in a hospital or clinic and will not be stored at home. NOTE: This sheet is a summary. It may not cover all possible information. If you have questions about this medicine, talk to your doctor, pharmacist, or health care provider.    2016, [employer] Standard. (2014-04-05 14:16:23)  

## 2016-04-26 NOTE — Progress Notes (Signed)
ID: Kurt Elliott   DOB: 08/21/1920  MR#: [id number]  [id number]  PCP: Renato Shin, MD GYN: SU:  OTHER MD: Germain Osgood [phone]); Franchot Gallo; Lavonna Monarch; Daryel November; Erskine Emery; Arloa Koh, Ileene Hutchinson [facility]  CHIEF COMPLAINT: Stage IV prostate cancer  CURRENT TREATMENT: lupron intermittently  HISTORY OF PROSTATE CANCER:  From the original intake note:  Kurt Elliott tells me his PSA and prostate had been followed for a very long time.  His PSA was approximately 15 about a year ago [2007], then about 6 months ago it went to 53, which means that it had just about doubled in 6 months.  Of course, that raised red flags and the patient had his prostate biopsied at [facility] under Dr. Gareth Eagle 08/02/2006.  This showed a Gleason 9 (4+5) adenocarcinoma in 5 of the 6 cores, the other core showing a Gleason 8 (3+5).  In short, an aggressive tumor.  The patient had a bone scan 08/10/2006, which was negative, and CTs of the abdomen and pelvis 08/15/2006, which showed an enlarged prostate at 5.7 x 5.1 cm, but no evidence of adenopathy or extracapsular spread; no obvious invasion of the rectum or the bladder; and negative bone windows.  There was incidental nodularity around the left perirenal fat of unclear significance.   The patient was started on Casodex for about a month before receiving his first Lupron shot 09/05/2006.  He had a second Lupron shot 09/09/2006.  His PSA responded well, and his subsequent history is as detailed below.  INTERVAL HISTORY: Kurt Elliott today for followup of his advanced prostate cancer accompanied by his caregiver Ms. Turner. He is here for a Lupron shot.  REVIEW OF SYSTEMS: Kurt Elliott is his usual positive self. Asked what his worse problem as he says it is passing too much gas. He denies stool incontinence. He denies pain. He tells me L left eye procedure is pending. He is not sure what it is 4. He tells me that haven't been no recent falls at home. He  uses the walker in the house. He is in a wheelchair here. He is eating well, with no problems swallowing and no cough during meals. A detailed review of systems today was otherwise stable  Past Medical History:  Diagnosis Date  . Adenocarcinoma of prostate ([place])   . Atheroma    no penetrating ulcer, Abdominal aorta  . CAD (coronary artery disease), native coronary artery    a. cath 2008  b. cath 10/21/2014 3v dzs w/ patent RCA stent, severe stenosis of mid LCx/OM1 s/p DES, moderately severe diffuse mid LAD treated with DES, patent prox LAD stent;  c. 01/2015 Cath: patent LAD, LCX, OM, and RCA stents->Med Rx.  . Carotid artery disease ([place])    Doppler, 2006, minimal disease  //  Doppler, May, 2012, mild increase velocities, 40-59% bilateral stenoses  . Cellulitis, leg    left  . Cough    ACE Inhibitor, tolerates ARB  . Diverticulum   . Esophageal stricture   . GERD (gastroesophageal reflux disease)   . Hx of colonic polyps   . Hyperglycemia   . Hyperlipemia   . Hypertension   . Hyperthyroidism   . Incisional hernia   . Inferior mesenteric artery injury    High-grade stenosis, CT scan, 2006  . Mild aortic stenosis    a. 09/2014 Echo: EF 65-70%, Gr 1 DD, no rwma, mild AS;  b. 01/2015 Echo: EF 65-70%, mod LVH, no rwma, 66mmHg intracavitary gradient, mildly dil LA.  Marland Kitchen  Pulmonary nodule, right    11-23-2006 / unchanged CT scan, July, 2009  . Rash    Ticlid  . Renal artery stenosis (H[facility])    Mild  . Renal cyst    Hyperdense cyst lower pole left kidney  . Spinal stenosis   . Unspecified disorder of liver   . Vertigo    PAST SURGICAL HISTORY: Past Surgical History:  Procedure Laterality Date  . CARDIAC CATHETERIZATION    . CARDIAC CATHETERIZATION N/A 02/18/2015   Procedure: Right/Left Heart Cath and Coronary Angiography;  Surgeon: Leonie Man, MD;  Location: [facility];  Service: Cardiovascular;  Laterality: N/A;  . ESOPHAGOGASTRODUODENOSCOPY    . INCISIONAL HERNIA REPAIR    .  LEFT HEART CATHETERIZATION WITH CORONARY ANGIOGRAM N/A 10/21/2014   Procedure: LEFT HEART CATHETERIZATION WITH CORONARY ANGIOGRAM;  Surgeon: Sherren Mocha, MD;  Location: [facility] C[facility] LAB;  Service: Cardiovascular;  Laterality: N/A;  . PERCUTANEOUS CORONARY STENT INTERVENTION (PCI-S)  10/21/2014   Procedure: PERCUTANEOUS CORONARY STENT INTERVENTION (PCI-S);  Surgeon: Sherren Mocha, MD;  Location: [facility] CATH LAB;  Service: Cardiovascular;;  Mid LAD and OM1  . SIGMOID RESECTION / RECTOPEXY      FAMILY HISTORY Family History  Problem Relation Age of Onset  . Pemphigus vulgaris Mother     died at 73  . Hypertension Mother   . Heart attack Father   . Hypertension Father   . Stroke Father   . Breast cancer Sister     2 sisters  . Hypertension Sister   . Hypertension Brother   The patient's father died at the age of 3 from an embolus.  The patient's mother died at the age of 91 in the setting of dementia.  The patient had 1 sister die with breast cancer (neglected), 1 sister died with diabetes, and 1 brother with Parkinson's disease.  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  He has been married 60+ years to Rod Holler, who used to be a Firefighter.  They have 4 children; 2 of them are Urologists in [place] (married to twin sisters).  In particular, they would like me to contact Aaron Edelman [phone] is the fax #) with any change in plans.  One of the sons, the oldest one, was a [place] in [place] (died in 11-22-2012 and from non-Hodgkin's lymphoma) and then the daughter, Clarise Cruz, is our Dr. Rosanna Randy wife. The patient has a total of 12 grandchildren and is waiting on great-grandchildren.   ADVANCED DIRECTIVES: in place  HEALTH MAINTENANCE: Social History  Substance Use Topics  . Smoking status: Never Smoker  . Smokeless tobacco: Never Used  . Alcohol use No     Colonoscopy: 11-23-03  Bone density:  Lipid panel:  Allergies  Allergen Reactions  . Isosorbide Nitrate Other (See Comments)     "severe Headache" per patient  . Lisinopril Cough  . Ticlopidine Hcl Rash    Current Outpatient Prescriptions  Medication Sig Dispense Refill  . amLODipine (NORVASC) 5 MG tablet Take 1 tablet (5 mg total) by mouth daily. 30 tablet 1  . aspirin EC 81 MG tablet Take 81 mg by mouth daily.    . dabigatran (PRADAXA) 75 MG CAPS capsule Take 75 mg by mouth daily.    . diazepam (VALIUM) 2 MG tablet Take 0.5 tablets (1 mg total) by mouth at bedtime as needed (sleep). 30 tablet 0  . fluocinolone (SYNALAR) 0.01 % external solution APPLY TO AFFECTED EAR DAILY  1  . guaiFENesin-dextromethorphan (ROBITUSSIN  DM) 100-10 MG/5ML syrup Take 5 mLs by mouth every 4 (four) hours as needed for cough. 118 mL 3  . Leuprolide Acetate (LUPRON DEPOT IM) Inject into the muscle as directed. Done at [place] office approx once every 3 months    . methimazole (TAPAZOLE) 5 MG tablet Take 1 tablet (5 mg total) by mouth 3 (three) times a week. 15 tablet 5  . metoprolol succinate (TOPROL-XL) 25 MG 24 hr tablet Take 0.5 tablets (12.5 mg total) by mouth daily. 15 tablet 11  . nitroGLYCERIN (NITROSTAT) 0.4 MG SL tablet Place 1 tablet (0.4 mg total) under the tongue every 5 (five) minutes as needed for chest pain. 25 tablet 5  . pantoprazole (PROTONIX) 40 MG tablet TAKE 1 TABLET (40 MG TOTAL) BY MOUTH DAILY. 90 tablet 2  . traZODone (DESYREL) 50 MG tablet Take 25 mg by mouth at bedtime.     No current facility-administered medications for this visit.     OBJECTIVE: Elderly white man Who appears frail Vitals:   04/26/16 1434  BP: (!) 140/53  Pulse: (!) 58  Resp: 18  Temp: 97.3 F (36.3 C)     Body mass index is 22.11 kg/m.    ECOG FS: 2  Sclerae unicteric, EOMs intact Oropharynx clear and moist No cervical or supraclavicular adenopathy Lungs no rales or rhonchi Heart regular rate and rhythm Abd soft, nontender, positive bowel sounds MSK no focal spinal tenderness Neuro: nonfocal, pleasant affect   LAB  RESULTS: Lab Results  Component Value Date   WBC 6.5 04/26/2016   NEUTROABS 4.6 04/26/2016   HGB 11.3 (L) 04/26/2016   HCT 31.9 (L) 04/26/2016   MCV 92.7 04/26/2016   PLT 148 04/26/2016    Results for ALVINO, REVILL (MRN [id number]) as of 04/26/2016 15:18  Ref. Range 12/03/2014 11:14 02/05/2015 09:42 05/30/2015 10:48 12/16/2015 12:48 03/08/2016 13:47  PSA Latest Ref Range: 0.0 - 4.0 ng/mL 0.79 0.73 1.05 4.2 (H) 3.8   Results for SIMON, OHLINGER (MRN [id number]) as of 04/26/2016 15:18  Ref. Range 12/03/2014 11:14 02/05/2015 09:42 05/30/2015 10:48 12/16/2015 12:48 03/08/2016 13:47  Testosterone Latest Ref Range: 264 - 916 ng/dL 84 (L) 24.89 (L) 38 (L) 70 (L) 108 (L)     Chemistry      Component Value Date/Time   NA 140 04/26/2016 1406   K 4.2 04/26/2016 1406   CL 106 12/02/2015 1509   CL 107 01/02/2013 1059   CO2 23 04/26/2016 1406   BUN 18.9 04/26/2016 1406   CREATININE 1.0 04/26/2016 1406      Component Value Date/Time   CALCIUM 9.7 04/26/2016 1406   ALKPHOS 119 04/26/2016 1406   AST 35 (H) 04/26/2016 1406   ALT 23 04/26/2016 1406   BILITOT 1.06 04/26/2016 1406      No results found for: LABCA2  No components found for: LABCA125  No results for input(s): INR in the last 168 hours.  Urinalysis    Component Value Date/Time   COLORURINE YELLOW 03/03/2016 0135   APPEARANCEUR CLEAR 03/03/2016 0135   LABSPEC 1.023 03/03/2016 0135   PHURINE 7.5 03/03/2016 0135   GLUCOSEU NEGATIVE 03/03/2016 0135   GLUCOSEU NEGATIVE 12/02/2015 1525   HGBUR NEGATIVE 03/03/2016 0135   BILIRUBINUR NEGATIVE 03/03/2016 0135   KETONESUR NEGATIVE 03/03/2016 0135   PROTEINUR NEGATIVE 03/03/2016 0135   UROBILINOGEN 2.0 (A) 12/02/2015 1525   NITRITE NEGATIVE 03/03/2016 0135   LEUKOCYTESUR NEGATIVE 03/03/2016 0135    STUDIES: No results found.  ASSESSMENT: 82 y.o.  Kurt Elliott man with a history of prostate cancer initially biopsied January 2008, Gleason 4+5, with a rapid doubling time, treated  initially with Casodex and Lupron with an excellent PSA response, status post radiation to 78 Gy given with curative intent completed October 2008.  Off Lupron as of December 2008.  (1) With the eventual development of right hip pain and a rapid PSA doubling time, he resumed Lupron and Casodex September 2011 with the Casodex stopped after a month.  He receives Lupron intermittently, depending on his PSA and testosterone levels, most recent dose 06/03/2015  PLAN:  Tri will be moving to [place] later this month so this will be his last visit here   I'm going ahead and giving him a Lupron shot today. I don't think you will need one in the next 3 months. If he sees a medical oncologist 6-8 weeks from now they can follow his labs. We will have a baseline drawn today and those results will be available later this week.  Once he becomes established in [place] we can directly send all labs his medical oncologist there. Of course we will be available to him and his family at any point if we can be of further help   Cainan Trull C    04/26/2016

## 2016-04-27 ENCOUNTER — Ambulatory Visit (INDEPENDENT_AMBULATORY_CARE_PROVIDER_SITE_OTHER): Payer: Medicare Other

## 2016-04-27 DIAGNOSIS — Z23 Encounter for immunization: Secondary | ICD-10-CM | POA: Diagnosis not present

## 2016-04-27 LAB — TESTOSTERONE, FREE, TOTAL, SHBG
Free Testosterone(Direct): 1.3 pg/mL — ABNORMAL LOW (ref 6.6–18.1)
Sex Horm Binding Glob, Serum: 152.2 nmol/L — ABNORMAL HIGH (ref 19.3–76.4)
Testosterone, Serum: 106 ng/dL — ABNORMAL LOW (ref 264–916)

## 2016-04-27 LAB — PSA: PROSTATE SPECIFIC AG, SERUM: 5.4 ng/mL — AB (ref 0.0–4.0)

## 2016-04-30 ENCOUNTER — Telehealth: Payer: Self-pay | Admitting: Endocrinology

## 2016-04-30 ENCOUNTER — Other Ambulatory Visit (HOSPITAL_COMMUNITY): Payer: Medicare Other

## 2016-04-30 DIAGNOSIS — I129 Hypertensive chronic kidney disease with stage 1 through stage 4 chronic kidney disease, or unspecified chronic kidney disease: Secondary | ICD-10-CM | POA: Diagnosis not present

## 2016-04-30 DIAGNOSIS — I2510 Atherosclerotic heart disease of native coronary artery without angina pectoris: Secondary | ICD-10-CM | POA: Diagnosis not present

## 2016-04-30 DIAGNOSIS — K219 Gastro-esophageal reflux disease without esophagitis: Secondary | ICD-10-CM | POA: Diagnosis not present

## 2016-04-30 DIAGNOSIS — G250 Essential tremor: Secondary | ICD-10-CM | POA: Diagnosis not present

## 2016-04-30 DIAGNOSIS — D649 Anemia, unspecified: Secondary | ICD-10-CM | POA: Diagnosis not present

## 2016-04-30 DIAGNOSIS — N183 Chronic kidney disease, stage 3 (moderate): Secondary | ICD-10-CM | POA: Diagnosis not present

## 2016-04-30 NOTE — Telephone Encounter (Signed)
Herbert Deaner, PT with [facility] called and said that Pt cancelled one visit this week due to pain from an eye injection.  He is requesting to make up this session. CB # [phone]

## 2016-04-30 NOTE — Telephone Encounter (Signed)
ok 

## 2016-04-30 NOTE — Telephone Encounter (Signed)
See message and please advise, Thanks!  

## 2016-04-30 NOTE — Telephone Encounter (Signed)
I contacted Kurt Elliott with [facility] and advise via voicemail Dr. Loanne Drilling has given the verbal ok for the missed PT visits to be made up.

## 2016-05-01 ENCOUNTER — Encounter: Payer: Self-pay | Admitting: Oncology

## 2016-05-04 DIAGNOSIS — G250 Essential tremor: Secondary | ICD-10-CM | POA: Diagnosis not present

## 2016-05-04 DIAGNOSIS — I2510 Atherosclerotic heart disease of native coronary artery without angina pectoris: Secondary | ICD-10-CM | POA: Diagnosis not present

## 2016-05-04 DIAGNOSIS — I129 Hypertensive chronic kidney disease with stage 1 through stage 4 chronic kidney disease, or unspecified chronic kidney disease: Secondary | ICD-10-CM | POA: Diagnosis not present

## 2016-05-04 DIAGNOSIS — N183 Chronic kidney disease, stage 3 (moderate): Secondary | ICD-10-CM | POA: Diagnosis not present

## 2016-05-04 DIAGNOSIS — K219 Gastro-esophageal reflux disease without esophagitis: Secondary | ICD-10-CM | POA: Diagnosis not present

## 2016-05-04 DIAGNOSIS — D649 Anemia, unspecified: Secondary | ICD-10-CM | POA: Diagnosis not present

## 2016-05-07 DIAGNOSIS — G250 Essential tremor: Secondary | ICD-10-CM | POA: Diagnosis not present

## 2016-05-07 DIAGNOSIS — I129 Hypertensive chronic kidney disease with stage 1 through stage 4 chronic kidney disease, or unspecified chronic kidney disease: Secondary | ICD-10-CM | POA: Diagnosis not present

## 2016-05-07 DIAGNOSIS — N183 Chronic kidney disease, stage 3 (moderate): Secondary | ICD-10-CM | POA: Diagnosis not present

## 2016-05-07 DIAGNOSIS — I2510 Atherosclerotic heart disease of native coronary artery without angina pectoris: Secondary | ICD-10-CM | POA: Diagnosis not present

## 2016-05-07 DIAGNOSIS — D649 Anemia, unspecified: Secondary | ICD-10-CM | POA: Diagnosis not present

## 2016-05-07 DIAGNOSIS — K219 Gastro-esophageal reflux disease without esophagitis: Secondary | ICD-10-CM | POA: Diagnosis not present

## 2016-05-10 ENCOUNTER — Other Ambulatory Visit: Payer: Self-pay

## 2016-05-10 ENCOUNTER — Ambulatory Visit (HOSPITAL_COMMUNITY): Payer: Medicare Other | Attending: Cardiology

## 2016-05-10 DIAGNOSIS — I77819 Aortic ectasia, unspecified site: Secondary | ICD-10-CM | POA: Insufficient documentation

## 2016-05-10 DIAGNOSIS — I350 Nonrheumatic aortic (valve) stenosis: Secondary | ICD-10-CM | POA: Insufficient documentation

## 2016-05-10 DIAGNOSIS — I253 Aneurysm of heart: Secondary | ICD-10-CM | POA: Diagnosis not present

## 2016-05-10 DIAGNOSIS — I501 Left ventricular failure: Secondary | ICD-10-CM | POA: Insufficient documentation

## 2016-05-11 ENCOUNTER — Telehealth: Payer: Self-pay

## 2016-05-11 DIAGNOSIS — K219 Gastro-esophageal reflux disease without esophagitis: Secondary | ICD-10-CM | POA: Diagnosis not present

## 2016-05-11 DIAGNOSIS — G250 Essential tremor: Secondary | ICD-10-CM | POA: Diagnosis not present

## 2016-05-11 DIAGNOSIS — I2510 Atherosclerotic heart disease of native coronary artery without angina pectoris: Secondary | ICD-10-CM | POA: Diagnosis not present

## 2016-05-11 DIAGNOSIS — N183 Chronic kidney disease, stage 3 (moderate): Secondary | ICD-10-CM | POA: Diagnosis not present

## 2016-05-11 DIAGNOSIS — I129 Hypertensive chronic kidney disease with stage 1 through stage 4 chronic kidney disease, or unspecified chronic kidney disease: Secondary | ICD-10-CM | POA: Diagnosis not present

## 2016-05-11 DIAGNOSIS — D649 Anemia, unspecified: Secondary | ICD-10-CM | POA: Diagnosis not present

## 2016-05-11 NOTE — Telephone Encounter (Signed)
Herbert Deaner, physical therapist with [facility] called and stated he had discharged the patient today due his coming move to [place]. He did want to advise [place] the patient had been experiencing diarrhea for the past 5 days and had complained of decreased strength and mobility, but this was not noticed in his physical therapy visit today.

## 2016-05-12 ENCOUNTER — Telehealth: Payer: Self-pay

## 2016-05-12 NOTE — Telephone Encounter (Signed)
Male person LVM that pt is waiting for PSA results, please call pt and fax results to Dr Ernestine Conrad. Called back and s/w Antonieta Pert gave her results. Faxed results to Dr Ernestine Conrad. Pt is moving Friday to [place].

## 2016-05-12 NOTE — Telephone Encounter (Signed)
S/w Dr Jana Hakim about increase in PSA. Pt received lupron shot at that time and the PSA should come down. S/w daughter this information. Dr Jana Hakim also told them to get PSA rechecked in 6-8 weeks. Daughter aware.

## 2016-05-29 ENCOUNTER — Other Ambulatory Visit: Payer: Self-pay | Admitting: Endocrinology

## 2016-06-15 ENCOUNTER — Other Ambulatory Visit: Payer: Medicare Other

## 2016-07-08 IMAGING — CR DG CHEST 2V
2 series · 2 of 2 positions shown · non-contrast
Comparison: Radiograph dated 03/28/2015

CLINICAL DATA: [AGE] male with cough and nausea

EXAM:
CHEST  2 VIEW

[w chest lat]
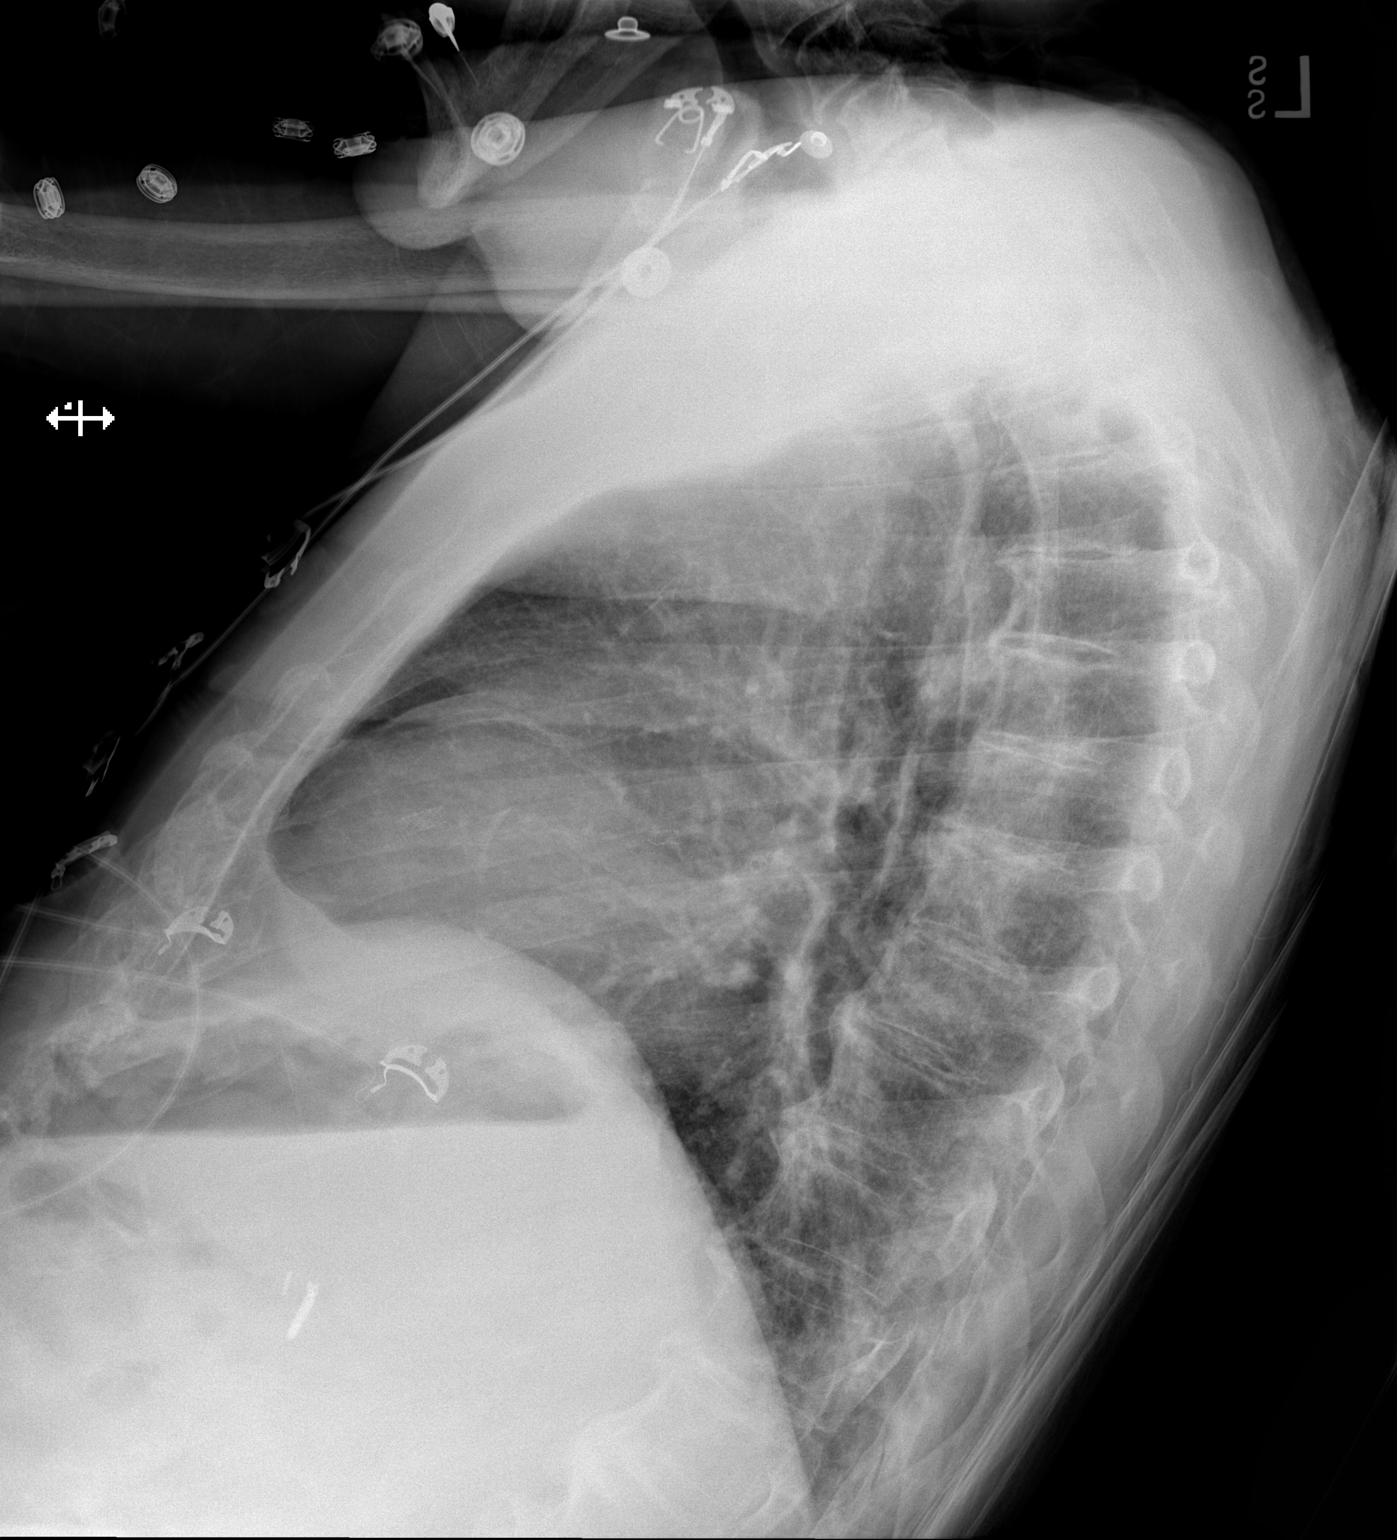

[x chest ap]
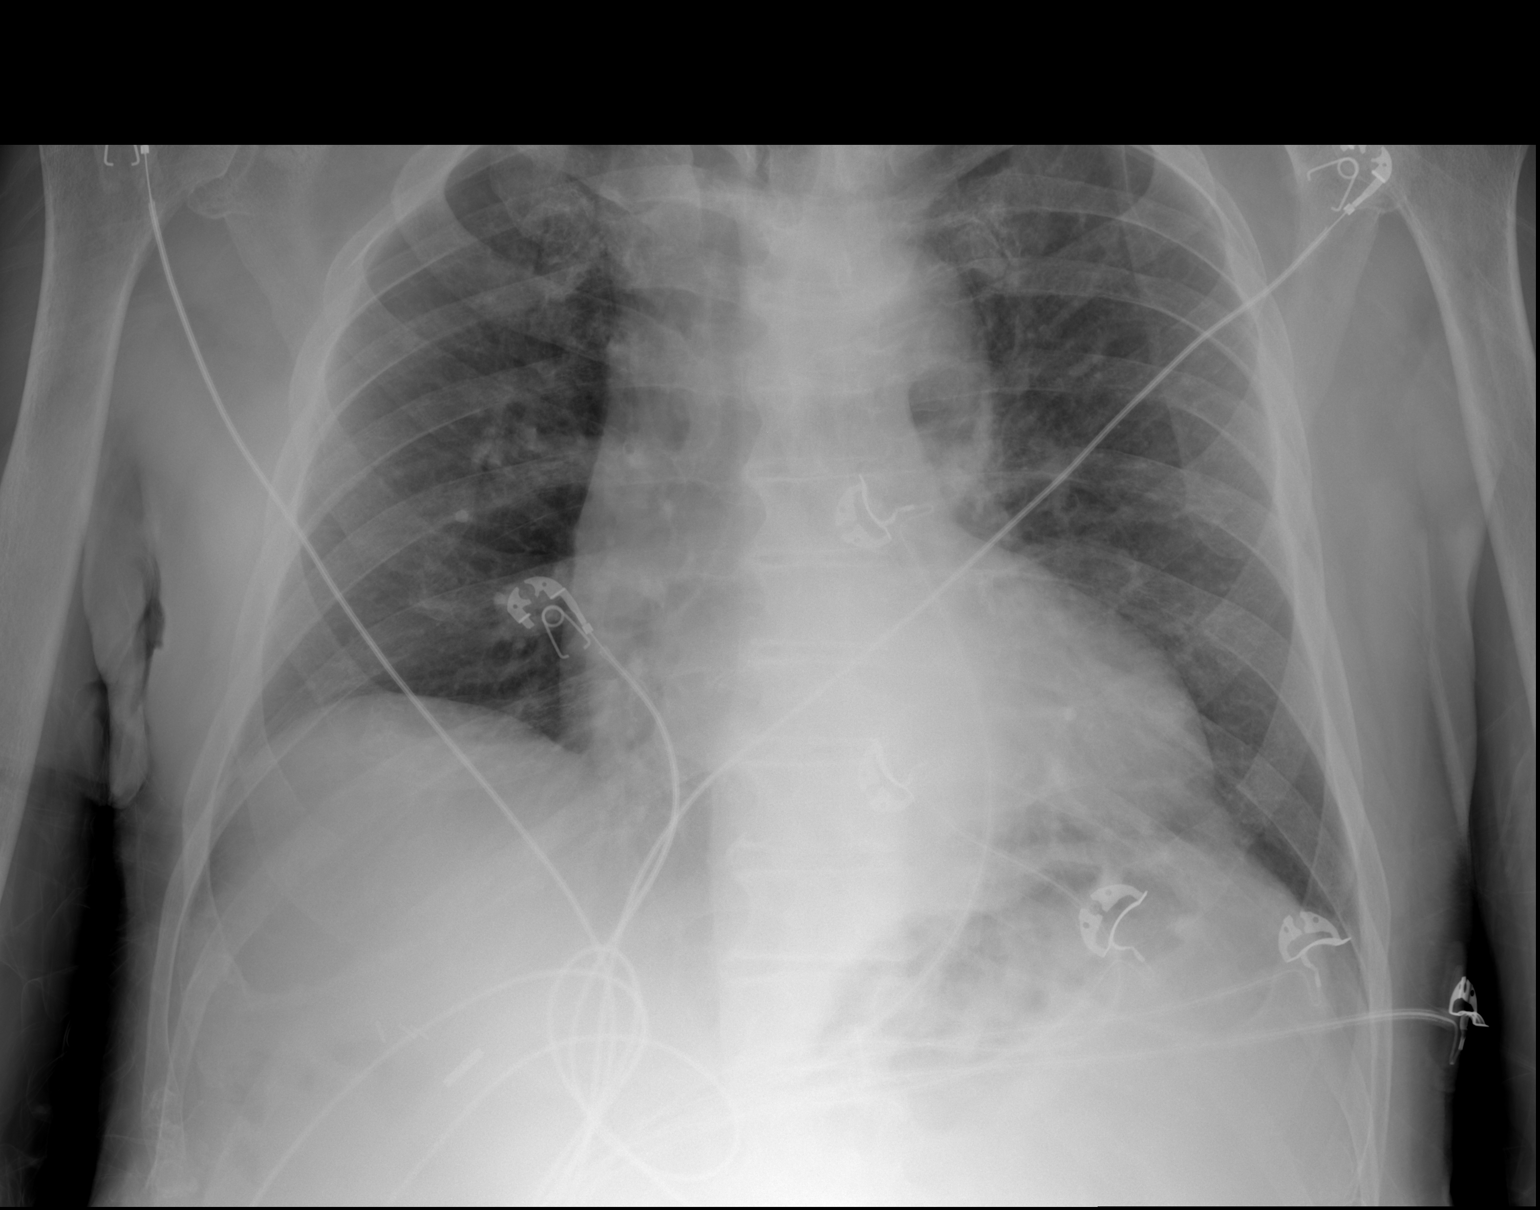

[2 of 2 positions shown; findings below may reference images not displayed]

FINDINGS: Two views of the chest demonstrate minimal atelectatic changes of
the left lung base. No focal consolidation, pleural effusion, or
pneumothorax. Stable cardiac silhouette. No acute osseous pathology.
IMPRESSION: Left lung base minimal atelectatic changes. Pneumonia is less
likely.

## 2016-07-13 ENCOUNTER — Other Ambulatory Visit: Payer: Self-pay | Admitting: Cardiovascular Disease

## 2016-07-13 NOTE — Telephone Encounter (Signed)
AVS Reports   Date/Time Report Action User  04/19/2016 10:59 AM After Visit Summary Printed Barkley Boards, RN  Patient Instructions   Medication Instructions:  Your physician has recommended you make the following change in your medication:  1. STOP Plavix (clopidogrel)

## 2016-07-29 ENCOUNTER — Telehealth: Payer: Self-pay | Admitting: Endocrinology

## 2016-07-29 MED ORDER — AMLODIPINE BESYLATE 5 MG PO TABS: 5.0000 mg | ORAL_TABLET | Freq: Every day | ORAL | 1 refills | Status: DC

## 2016-07-29 NOTE — Telephone Encounter (Signed)
Refill submitted. 

## 2016-07-29 NOTE — Telephone Encounter (Signed)
See message and please advise if ok to refill. Thanks!  

## 2016-07-29 NOTE — Telephone Encounter (Signed)
Please refill x 3 mos, x 1

## 2016-07-29 NOTE — Telephone Encounter (Signed)
Refills are needed on the amlodipine 5 [facility] [place] [phone]

## 2016-08-05 DIAGNOSIS — H903 Sensorineural hearing loss, bilateral: Secondary | ICD-10-CM | POA: Diagnosis not present

## 2016-08-05 DIAGNOSIS — H6123 Impacted cerumen, bilateral: Secondary | ICD-10-CM | POA: Diagnosis not present

## 2016-08-05 DIAGNOSIS — H900 Conductive hearing loss, bilateral: Secondary | ICD-10-CM | POA: Diagnosis not present

## 2016-09-13 ENCOUNTER — Telehealth: Payer: Self-pay | Admitting: Endocrinology

## 2016-09-13 MED ORDER — METHIMAZOLE 5 MG PO TABS: 5.0000 mg | ORAL_TABLET | ORAL | 1 refills | Status: AC

## 2016-09-13 NOTE — Telephone Encounter (Signed)
Refill submitted. 

## 2016-09-13 NOTE — Telephone Encounter (Signed)
[facility], and would like to know if he can have additional refills on the script.

## 2016-09-15 ENCOUNTER — Other Ambulatory Visit: Payer: Medicare Other

## 2016-09-17 ENCOUNTER — Other Ambulatory Visit: Payer: Self-pay | Admitting: Endocrinology

## 2016-09-21 ENCOUNTER — Telehealth: Payer: Self-pay | Admitting: Endocrinology

## 2016-09-21 MED ORDER — TRAZODONE HCL 50 MG PO TABS: 25.0000 mg | ORAL_TABLET | Freq: Every day | ORAL | 0 refills | Status: DC

## 2016-09-21 NOTE — Telephone Encounter (Signed)
Refill submitted. 

## 2016-09-21 NOTE — Telephone Encounter (Signed)
Please refill x 1  

## 2016-09-21 NOTE — Telephone Encounter (Signed)
Please advise if ok to refill. Thanks 

## 2016-09-21 NOTE — Telephone Encounter (Signed)
Refill of   traZODone (DESYREL) 50 MG tablet   [id number] [place] [place] [facility] [place] [phone] (Phone) [phone] (Fax)

## 2016-11-04 DIAGNOSIS — I2510 Atherosclerotic heart disease of native coronary artery without angina pectoris: Secondary | ICD-10-CM | POA: Diagnosis not present

## 2016-11-04 DIAGNOSIS — M898X9 Other specified disorders of bone, unspecified site: Secondary | ICD-10-CM | POA: Diagnosis not present

## 2016-11-04 DIAGNOSIS — E559 Vitamin D deficiency, unspecified: Secondary | ICD-10-CM | POA: Diagnosis not present

## 2016-11-04 DIAGNOSIS — E785 Hyperlipidemia, unspecified: Secondary | ICD-10-CM | POA: Diagnosis not present

## 2016-11-04 DIAGNOSIS — D649 Anemia, unspecified: Secondary | ICD-10-CM | POA: Diagnosis not present

## 2016-11-04 DIAGNOSIS — E119 Type 2 diabetes mellitus without complications: Secondary | ICD-10-CM | POA: Diagnosis not present

## 2016-11-04 DIAGNOSIS — R972 Elevated prostate specific antigen [PSA]: Secondary | ICD-10-CM | POA: Diagnosis not present

## 2016-11-05 DIAGNOSIS — S61511A Laceration without foreign body of right wrist, initial encounter: Secondary | ICD-10-CM | POA: Diagnosis not present

## 2016-11-06 DIAGNOSIS — Z9181 History of falling: Secondary | ICD-10-CM | POA: Diagnosis not present

## 2016-11-06 DIAGNOSIS — S61501D Unspecified open wound of right wrist, subsequent encounter: Secondary | ICD-10-CM | POA: Diagnosis not present

## 2016-11-06 DIAGNOSIS — I10 Essential (primary) hypertension: Secondary | ICD-10-CM | POA: Diagnosis not present

## 2016-11-06 DIAGNOSIS — S41101D Unspecified open wound of right upper arm, subsequent encounter: Secondary | ICD-10-CM | POA: Diagnosis not present

## 2016-11-07 DIAGNOSIS — S41101D Unspecified open wound of right upper arm, subsequent encounter: Secondary | ICD-10-CM | POA: Diagnosis not present

## 2016-11-07 DIAGNOSIS — Z9181 History of falling: Secondary | ICD-10-CM | POA: Diagnosis not present

## 2016-11-07 DIAGNOSIS — I10 Essential (primary) hypertension: Secondary | ICD-10-CM | POA: Diagnosis not present

## 2016-11-07 DIAGNOSIS — S61501D Unspecified open wound of right wrist, subsequent encounter: Secondary | ICD-10-CM | POA: Diagnosis not present

## 2016-11-08 DIAGNOSIS — S41101D Unspecified open wound of right upper arm, subsequent encounter: Secondary | ICD-10-CM | POA: Diagnosis not present

## 2016-11-08 DIAGNOSIS — S61501D Unspecified open wound of right wrist, subsequent encounter: Secondary | ICD-10-CM | POA: Diagnosis not present

## 2016-11-08 DIAGNOSIS — I10 Essential (primary) hypertension: Secondary | ICD-10-CM | POA: Diagnosis not present

## 2016-11-08 DIAGNOSIS — Z9181 History of falling: Secondary | ICD-10-CM | POA: Diagnosis not present

## 2016-11-09 DIAGNOSIS — Z9181 History of falling: Secondary | ICD-10-CM | POA: Diagnosis not present

## 2016-11-09 DIAGNOSIS — I10 Essential (primary) hypertension: Secondary | ICD-10-CM | POA: Diagnosis not present

## 2016-11-09 DIAGNOSIS — S61501D Unspecified open wound of right wrist, subsequent encounter: Secondary | ICD-10-CM | POA: Diagnosis not present

## 2016-11-09 DIAGNOSIS — S41101D Unspecified open wound of right upper arm, subsequent encounter: Secondary | ICD-10-CM | POA: Diagnosis not present

## 2016-11-10 DIAGNOSIS — S61501D Unspecified open wound of right wrist, subsequent encounter: Secondary | ICD-10-CM | POA: Diagnosis not present

## 2016-11-10 DIAGNOSIS — Z9181 History of falling: Secondary | ICD-10-CM | POA: Diagnosis not present

## 2016-11-10 DIAGNOSIS — I10 Essential (primary) hypertension: Secondary | ICD-10-CM | POA: Diagnosis not present

## 2016-11-10 DIAGNOSIS — S41101D Unspecified open wound of right upper arm, subsequent encounter: Secondary | ICD-10-CM | POA: Diagnosis not present

## 2016-11-10 DIAGNOSIS — S61511D Laceration without foreign body of right wrist, subsequent encounter: Secondary | ICD-10-CM | POA: Diagnosis not present

## 2016-11-11 DIAGNOSIS — S61501D Unspecified open wound of right wrist, subsequent encounter: Secondary | ICD-10-CM | POA: Diagnosis not present

## 2016-11-11 DIAGNOSIS — S41101D Unspecified open wound of right upper arm, subsequent encounter: Secondary | ICD-10-CM | POA: Diagnosis not present

## 2016-11-11 DIAGNOSIS — Z9181 History of falling: Secondary | ICD-10-CM | POA: Diagnosis not present

## 2016-11-11 DIAGNOSIS — I10 Essential (primary) hypertension: Secondary | ICD-10-CM | POA: Diagnosis not present

## 2016-11-12 DIAGNOSIS — S41101D Unspecified open wound of right upper arm, subsequent encounter: Secondary | ICD-10-CM | POA: Diagnosis not present

## 2016-11-12 DIAGNOSIS — Z9181 History of falling: Secondary | ICD-10-CM | POA: Diagnosis not present

## 2016-11-12 DIAGNOSIS — I10 Essential (primary) hypertension: Secondary | ICD-10-CM | POA: Diagnosis not present

## 2016-11-12 DIAGNOSIS — S61501D Unspecified open wound of right wrist, subsequent encounter: Secondary | ICD-10-CM | POA: Diagnosis not present

## 2016-11-13 DIAGNOSIS — S41101D Unspecified open wound of right upper arm, subsequent encounter: Secondary | ICD-10-CM | POA: Diagnosis not present

## 2016-11-13 DIAGNOSIS — S61501D Unspecified open wound of right wrist, subsequent encounter: Secondary | ICD-10-CM | POA: Diagnosis not present

## 2016-11-13 DIAGNOSIS — Z9181 History of falling: Secondary | ICD-10-CM | POA: Diagnosis not present

## 2016-11-13 DIAGNOSIS — I10 Essential (primary) hypertension: Secondary | ICD-10-CM | POA: Diagnosis not present

## 2016-11-14 DIAGNOSIS — I10 Essential (primary) hypertension: Secondary | ICD-10-CM | POA: Diagnosis not present

## 2016-11-14 DIAGNOSIS — S61501D Unspecified open wound of right wrist, subsequent encounter: Secondary | ICD-10-CM | POA: Diagnosis not present

## 2016-11-14 DIAGNOSIS — Z9181 History of falling: Secondary | ICD-10-CM | POA: Diagnosis not present

## 2016-11-14 DIAGNOSIS — S41101D Unspecified open wound of right upper arm, subsequent encounter: Secondary | ICD-10-CM | POA: Diagnosis not present

## 2016-11-15 DIAGNOSIS — S41101D Unspecified open wound of right upper arm, subsequent encounter: Secondary | ICD-10-CM | POA: Diagnosis not present

## 2016-11-15 DIAGNOSIS — Z9181 History of falling: Secondary | ICD-10-CM | POA: Diagnosis not present

## 2016-11-15 DIAGNOSIS — S61501D Unspecified open wound of right wrist, subsequent encounter: Secondary | ICD-10-CM | POA: Diagnosis not present

## 2016-11-15 DIAGNOSIS — I10 Essential (primary) hypertension: Secondary | ICD-10-CM | POA: Diagnosis not present

## 2016-11-16 DIAGNOSIS — Z9181 History of falling: Secondary | ICD-10-CM | POA: Diagnosis not present

## 2016-11-16 DIAGNOSIS — S41101D Unspecified open wound of right upper arm, subsequent encounter: Secondary | ICD-10-CM | POA: Diagnosis not present

## 2016-11-16 DIAGNOSIS — I10 Essential (primary) hypertension: Secondary | ICD-10-CM | POA: Diagnosis not present

## 2016-11-16 DIAGNOSIS — S61501D Unspecified open wound of right wrist, subsequent encounter: Secondary | ICD-10-CM | POA: Diagnosis not present

## 2016-11-18 DIAGNOSIS — Z9181 History of falling: Secondary | ICD-10-CM | POA: Diagnosis not present

## 2016-11-18 DIAGNOSIS — I10 Essential (primary) hypertension: Secondary | ICD-10-CM | POA: Diagnosis not present

## 2016-11-18 DIAGNOSIS — S61501D Unspecified open wound of right wrist, subsequent encounter: Secondary | ICD-10-CM | POA: Diagnosis not present

## 2016-11-18 DIAGNOSIS — S41101D Unspecified open wound of right upper arm, subsequent encounter: Secondary | ICD-10-CM | POA: Diagnosis not present

## 2016-11-19 ENCOUNTER — Other Ambulatory Visit: Payer: Self-pay | Admitting: Endocrinology

## 2016-11-19 NOTE — Telephone Encounter (Signed)
Please refill x 1  

## 2016-11-19 NOTE — Telephone Encounter (Signed)
Please advise if okay to refill this Rx.   Thank you!  Thornton Papas.

## 2016-11-20 DIAGNOSIS — S41101D Unspecified open wound of right upper arm, subsequent encounter: Secondary | ICD-10-CM | POA: Diagnosis not present

## 2016-11-20 DIAGNOSIS — Z9181 History of falling: Secondary | ICD-10-CM | POA: Diagnosis not present

## 2016-11-20 DIAGNOSIS — I10 Essential (primary) hypertension: Secondary | ICD-10-CM | POA: Diagnosis not present

## 2016-11-20 DIAGNOSIS — S61501D Unspecified open wound of right wrist, subsequent encounter: Secondary | ICD-10-CM | POA: Diagnosis not present

## 2016-11-21 DIAGNOSIS — S41101D Unspecified open wound of right upper arm, subsequent encounter: Secondary | ICD-10-CM | POA: Diagnosis not present

## 2016-11-21 DIAGNOSIS — S61501D Unspecified open wound of right wrist, subsequent encounter: Secondary | ICD-10-CM | POA: Diagnosis not present

## 2016-11-21 DIAGNOSIS — Z9181 History of falling: Secondary | ICD-10-CM | POA: Diagnosis not present

## 2016-11-21 DIAGNOSIS — I10 Essential (primary) hypertension: Secondary | ICD-10-CM | POA: Diagnosis not present

## 2016-11-22 DIAGNOSIS — Z9181 History of falling: Secondary | ICD-10-CM | POA: Diagnosis not present

## 2016-11-22 DIAGNOSIS — S61501D Unspecified open wound of right wrist, subsequent encounter: Secondary | ICD-10-CM | POA: Diagnosis not present

## 2016-11-22 DIAGNOSIS — S41101D Unspecified open wound of right upper arm, subsequent encounter: Secondary | ICD-10-CM | POA: Diagnosis not present

## 2016-11-22 DIAGNOSIS — I10 Essential (primary) hypertension: Secondary | ICD-10-CM | POA: Diagnosis not present

## 2016-11-23 DIAGNOSIS — I10 Essential (primary) hypertension: Secondary | ICD-10-CM | POA: Diagnosis not present

## 2016-11-23 DIAGNOSIS — Z9181 History of falling: Secondary | ICD-10-CM | POA: Diagnosis not present

## 2016-11-23 DIAGNOSIS — S41101D Unspecified open wound of right upper arm, subsequent encounter: Secondary | ICD-10-CM | POA: Diagnosis not present

## 2016-11-23 DIAGNOSIS — S61501D Unspecified open wound of right wrist, subsequent encounter: Secondary | ICD-10-CM | POA: Diagnosis not present

## 2016-11-25 DIAGNOSIS — Z9181 History of falling: Secondary | ICD-10-CM | POA: Diagnosis not present

## 2016-11-25 DIAGNOSIS — I10 Essential (primary) hypertension: Secondary | ICD-10-CM | POA: Diagnosis not present

## 2016-11-25 DIAGNOSIS — S41101D Unspecified open wound of right upper arm, subsequent encounter: Secondary | ICD-10-CM | POA: Diagnosis not present

## 2016-11-25 DIAGNOSIS — S61501D Unspecified open wound of right wrist, subsequent encounter: Secondary | ICD-10-CM | POA: Diagnosis not present

## 2016-11-27 DIAGNOSIS — I10 Essential (primary) hypertension: Secondary | ICD-10-CM | POA: Diagnosis not present

## 2016-11-27 DIAGNOSIS — S41101D Unspecified open wound of right upper arm, subsequent encounter: Secondary | ICD-10-CM | POA: Diagnosis not present

## 2016-11-27 DIAGNOSIS — S61501D Unspecified open wound of right wrist, subsequent encounter: Secondary | ICD-10-CM | POA: Diagnosis not present

## 2016-11-27 DIAGNOSIS — Z9181 History of falling: Secondary | ICD-10-CM | POA: Diagnosis not present

## 2016-11-29 DIAGNOSIS — S41101D Unspecified open wound of right upper arm, subsequent encounter: Secondary | ICD-10-CM | POA: Diagnosis not present

## 2016-11-29 DIAGNOSIS — I10 Essential (primary) hypertension: Secondary | ICD-10-CM | POA: Diagnosis not present

## 2016-11-29 DIAGNOSIS — Z9181 History of falling: Secondary | ICD-10-CM | POA: Diagnosis not present

## 2016-11-29 DIAGNOSIS — S61501D Unspecified open wound of right wrist, subsequent encounter: Secondary | ICD-10-CM | POA: Diagnosis not present

## 2016-12-01 DIAGNOSIS — I10 Essential (primary) hypertension: Secondary | ICD-10-CM | POA: Diagnosis not present

## 2016-12-01 DIAGNOSIS — Z9181 History of falling: Secondary | ICD-10-CM | POA: Diagnosis not present

## 2016-12-01 DIAGNOSIS — S41101D Unspecified open wound of right upper arm, subsequent encounter: Secondary | ICD-10-CM | POA: Diagnosis not present

## 2016-12-01 DIAGNOSIS — S61501D Unspecified open wound of right wrist, subsequent encounter: Secondary | ICD-10-CM | POA: Diagnosis not present

## 2016-12-02 DIAGNOSIS — I10 Essential (primary) hypertension: Secondary | ICD-10-CM | POA: Diagnosis not present

## 2016-12-02 DIAGNOSIS — Z9181 History of falling: Secondary | ICD-10-CM | POA: Diagnosis not present

## 2016-12-02 DIAGNOSIS — S61501D Unspecified open wound of right wrist, subsequent encounter: Secondary | ICD-10-CM | POA: Diagnosis not present

## 2016-12-02 DIAGNOSIS — S41101D Unspecified open wound of right upper arm, subsequent encounter: Secondary | ICD-10-CM | POA: Diagnosis not present

## 2016-12-03 DIAGNOSIS — S61501D Unspecified open wound of right wrist, subsequent encounter: Secondary | ICD-10-CM | POA: Diagnosis not present

## 2016-12-03 DIAGNOSIS — Z9181 History of falling: Secondary | ICD-10-CM | POA: Diagnosis not present

## 2016-12-03 DIAGNOSIS — I10 Essential (primary) hypertension: Secondary | ICD-10-CM | POA: Diagnosis not present

## 2016-12-03 DIAGNOSIS — S41101D Unspecified open wound of right upper arm, subsequent encounter: Secondary | ICD-10-CM | POA: Diagnosis not present

## 2016-12-06 DIAGNOSIS — S41101D Unspecified open wound of right upper arm, subsequent encounter: Secondary | ICD-10-CM | POA: Diagnosis not present

## 2016-12-06 DIAGNOSIS — I10 Essential (primary) hypertension: Secondary | ICD-10-CM | POA: Diagnosis not present

## 2016-12-06 DIAGNOSIS — Z9181 History of falling: Secondary | ICD-10-CM | POA: Diagnosis not present

## 2016-12-06 DIAGNOSIS — S61501D Unspecified open wound of right wrist, subsequent encounter: Secondary | ICD-10-CM | POA: Diagnosis not present

## 2016-12-09 DIAGNOSIS — S41101D Unspecified open wound of right upper arm, subsequent encounter: Secondary | ICD-10-CM | POA: Diagnosis not present

## 2016-12-09 DIAGNOSIS — I10 Essential (primary) hypertension: Secondary | ICD-10-CM | POA: Diagnosis not present

## 2016-12-09 DIAGNOSIS — S61501D Unspecified open wound of right wrist, subsequent encounter: Secondary | ICD-10-CM | POA: Diagnosis not present

## 2016-12-09 DIAGNOSIS — Z9181 History of falling: Secondary | ICD-10-CM | POA: Diagnosis not present

## 2016-12-11 DIAGNOSIS — S61501D Unspecified open wound of right wrist, subsequent encounter: Secondary | ICD-10-CM | POA: Diagnosis not present

## 2016-12-11 DIAGNOSIS — I10 Essential (primary) hypertension: Secondary | ICD-10-CM | POA: Diagnosis not present

## 2016-12-11 DIAGNOSIS — Z9181 History of falling: Secondary | ICD-10-CM | POA: Diagnosis not present

## 2016-12-11 DIAGNOSIS — S41101D Unspecified open wound of right upper arm, subsequent encounter: Secondary | ICD-10-CM | POA: Diagnosis not present

## 2016-12-13 DIAGNOSIS — S41101D Unspecified open wound of right upper arm, subsequent encounter: Secondary | ICD-10-CM | POA: Diagnosis not present

## 2016-12-13 DIAGNOSIS — Z9181 History of falling: Secondary | ICD-10-CM | POA: Diagnosis not present

## 2016-12-13 DIAGNOSIS — I10 Essential (primary) hypertension: Secondary | ICD-10-CM | POA: Diagnosis not present

## 2016-12-13 DIAGNOSIS — S61501D Unspecified open wound of right wrist, subsequent encounter: Secondary | ICD-10-CM | POA: Diagnosis not present

## 2016-12-18 ENCOUNTER — Other Ambulatory Visit: Payer: Self-pay | Admitting: Endocrinology

## 2017-01-04 DIAGNOSIS — Z9181 History of falling: Secondary | ICD-10-CM | POA: Diagnosis not present

## 2017-01-04 DIAGNOSIS — S41101D Unspecified open wound of right upper arm, subsequent encounter: Secondary | ICD-10-CM | POA: Diagnosis not present

## 2017-01-04 DIAGNOSIS — I10 Essential (primary) hypertension: Secondary | ICD-10-CM | POA: Diagnosis not present

## 2017-01-04 DIAGNOSIS — S61501D Unspecified open wound of right wrist, subsequent encounter: Secondary | ICD-10-CM | POA: Diagnosis not present

## 2017-01-21 ENCOUNTER — Other Ambulatory Visit: Payer: Self-pay | Admitting: Endocrinology

## 2017-01-21 NOTE — Telephone Encounter (Signed)
please call patient: Are you seeing a new dr in [facility] yet?

## 2017-01-21 NOTE — Telephone Encounter (Signed)
Called patient and was not able to leave a voice message. 

## 2017-01-21 NOTE — Telephone Encounter (Signed)
Please advise if okay to refill. Patient has not been seen since 11/2015. Thanks!

## 2017-02-21 DIAGNOSIS — H01001 Unspecified blepharitis right upper eyelid: Secondary | ICD-10-CM | POA: Diagnosis not present

## 2017-02-21 DIAGNOSIS — H40003 Preglaucoma, unspecified, bilateral: Secondary | ICD-10-CM | POA: Diagnosis not present

## 2017-02-21 DIAGNOSIS — H353131 Nonexudative age-related macular degeneration, bilateral, early dry stage: Secondary | ICD-10-CM | POA: Diagnosis not present

## 2017-03-17 DIAGNOSIS — H6123 Impacted cerumen, bilateral: Secondary | ICD-10-CM | POA: Diagnosis not present

## 2017-03-17 DIAGNOSIS — H903 Sensorineural hearing loss, bilateral: Secondary | ICD-10-CM | POA: Diagnosis not present

## 2017-04-01 DIAGNOSIS — R5383 Other fatigue: Secondary | ICD-10-CM | POA: Diagnosis not present

## 2017-04-01 DIAGNOSIS — D460 Refractory anemia without ring sideroblasts, so stated: Secondary | ICD-10-CM | POA: Diagnosis not present

## 2017-04-01 DIAGNOSIS — I509 Heart failure, unspecified: Secondary | ICD-10-CM | POA: Diagnosis not present

## 2017-04-01 DIAGNOSIS — R5381 Other malaise: Secondary | ICD-10-CM | POA: Diagnosis not present

## 2017-05-02 DIAGNOSIS — Z23 Encounter for immunization: Secondary | ICD-10-CM | POA: Diagnosis not present

## 2017-05-09 DIAGNOSIS — L89159 Pressure ulcer of sacral region, unspecified stage: Secondary | ICD-10-CM | POA: Diagnosis not present

## 2017-05-20 DIAGNOSIS — H903 Sensorineural hearing loss, bilateral: Secondary | ICD-10-CM | POA: Diagnosis not present

## 2017-05-20 DIAGNOSIS — R42 Dizziness and giddiness: Secondary | ICD-10-CM | POA: Diagnosis not present

## 2017-08-03 DIAGNOSIS — H10021 Other mucopurulent conjunctivitis, right eye: Secondary | ICD-10-CM | POA: Diagnosis not present

## 2017-08-11 DIAGNOSIS — H6123 Impacted cerumen, bilateral: Secondary | ICD-10-CM | POA: Diagnosis not present

## 2017-08-11 DIAGNOSIS — H903 Sensorineural hearing loss, bilateral: Secondary | ICD-10-CM | POA: Diagnosis not present

## 2017-09-22 DIAGNOSIS — R270 Ataxia, unspecified: Secondary | ICD-10-CM | POA: Diagnosis not present

## 2017-09-22 DIAGNOSIS — S0990XA Unspecified injury of head, initial encounter: Secondary | ICD-10-CM | POA: Diagnosis not present

## 2017-09-22 DIAGNOSIS — M25541 Pain in joints of right hand: Secondary | ICD-10-CM | POA: Diagnosis not present

## 2017-09-22 DIAGNOSIS — M79641 Pain in right hand: Secondary | ICD-10-CM | POA: Diagnosis not present

## 2017-09-22 DIAGNOSIS — S0003XA Contusion of scalp, initial encounter: Secondary | ICD-10-CM | POA: Diagnosis not present

## 2017-09-27 DIAGNOSIS — S0091XD Abrasion of unspecified part of head, subsequent encounter: Secondary | ICD-10-CM | POA: Diagnosis not present

## 2017-09-27 DIAGNOSIS — F0390 Unspecified dementia without behavioral disturbance: Secondary | ICD-10-CM | POA: Diagnosis not present

## 2017-09-27 DIAGNOSIS — I2510 Atherosclerotic heart disease of native coronary artery without angina pectoris: Secondary | ICD-10-CM | POA: Diagnosis not present

## 2017-09-27 DIAGNOSIS — I10 Essential (primary) hypertension: Secondary | ICD-10-CM | POA: Diagnosis not present

## 2017-09-27 DIAGNOSIS — W19XXXD Unspecified fall, subsequent encounter: Secondary | ICD-10-CM | POA: Diagnosis not present

## 2017-09-27 DIAGNOSIS — S41111D Laceration without foreign body of right upper arm, subsequent encounter: Secondary | ICD-10-CM | POA: Diagnosis not present

## 2017-09-28 DIAGNOSIS — I2510 Atherosclerotic heart disease of native coronary artery without angina pectoris: Secondary | ICD-10-CM | POA: Diagnosis not present

## 2017-09-28 DIAGNOSIS — F0390 Unspecified dementia without behavioral disturbance: Secondary | ICD-10-CM | POA: Diagnosis not present

## 2017-09-28 DIAGNOSIS — S0091XD Abrasion of unspecified part of head, subsequent encounter: Secondary | ICD-10-CM | POA: Diagnosis not present

## 2017-09-28 DIAGNOSIS — W19XXXD Unspecified fall, subsequent encounter: Secondary | ICD-10-CM | POA: Diagnosis not present

## 2017-09-28 DIAGNOSIS — I10 Essential (primary) hypertension: Secondary | ICD-10-CM | POA: Diagnosis not present

## 2017-09-28 DIAGNOSIS — S41111D Laceration without foreign body of right upper arm, subsequent encounter: Secondary | ICD-10-CM | POA: Diagnosis not present

## 2017-09-30 DIAGNOSIS — S41111D Laceration without foreign body of right upper arm, subsequent encounter: Secondary | ICD-10-CM | POA: Diagnosis not present

## 2017-09-30 DIAGNOSIS — F0390 Unspecified dementia without behavioral disturbance: Secondary | ICD-10-CM | POA: Diagnosis not present

## 2017-09-30 DIAGNOSIS — I10 Essential (primary) hypertension: Secondary | ICD-10-CM | POA: Diagnosis not present

## 2017-09-30 DIAGNOSIS — I2510 Atherosclerotic heart disease of native coronary artery without angina pectoris: Secondary | ICD-10-CM | POA: Diagnosis not present

## 2017-09-30 DIAGNOSIS — S0091XD Abrasion of unspecified part of head, subsequent encounter: Secondary | ICD-10-CM | POA: Diagnosis not present

## 2017-09-30 DIAGNOSIS — W19XXXD Unspecified fall, subsequent encounter: Secondary | ICD-10-CM | POA: Diagnosis not present

## 2017-10-03 DIAGNOSIS — I2510 Atherosclerotic heart disease of native coronary artery without angina pectoris: Secondary | ICD-10-CM | POA: Diagnosis not present

## 2017-10-03 DIAGNOSIS — W19XXXD Unspecified fall, subsequent encounter: Secondary | ICD-10-CM | POA: Diagnosis not present

## 2017-10-03 DIAGNOSIS — S41111D Laceration without foreign body of right upper arm, subsequent encounter: Secondary | ICD-10-CM | POA: Diagnosis not present

## 2017-10-03 DIAGNOSIS — I10 Essential (primary) hypertension: Secondary | ICD-10-CM | POA: Diagnosis not present

## 2017-10-03 DIAGNOSIS — S0091XD Abrasion of unspecified part of head, subsequent encounter: Secondary | ICD-10-CM | POA: Diagnosis not present

## 2017-10-03 DIAGNOSIS — F0390 Unspecified dementia without behavioral disturbance: Secondary | ICD-10-CM | POA: Diagnosis not present

## 2017-10-04 DIAGNOSIS — S41111D Laceration without foreign body of right upper arm, subsequent encounter: Secondary | ICD-10-CM | POA: Diagnosis not present

## 2017-10-04 DIAGNOSIS — I10 Essential (primary) hypertension: Secondary | ICD-10-CM | POA: Diagnosis not present

## 2017-10-04 DIAGNOSIS — I2510 Atherosclerotic heart disease of native coronary artery without angina pectoris: Secondary | ICD-10-CM | POA: Diagnosis not present

## 2017-10-04 DIAGNOSIS — F0390 Unspecified dementia without behavioral disturbance: Secondary | ICD-10-CM | POA: Diagnosis not present

## 2017-10-04 DIAGNOSIS — S0091XD Abrasion of unspecified part of head, subsequent encounter: Secondary | ICD-10-CM | POA: Diagnosis not present

## 2017-10-04 DIAGNOSIS — W19XXXD Unspecified fall, subsequent encounter: Secondary | ICD-10-CM | POA: Diagnosis not present

## 2017-10-06 DIAGNOSIS — I2510 Atherosclerotic heart disease of native coronary artery without angina pectoris: Secondary | ICD-10-CM | POA: Diagnosis not present

## 2017-10-06 DIAGNOSIS — S41111D Laceration without foreign body of right upper arm, subsequent encounter: Secondary | ICD-10-CM | POA: Diagnosis not present

## 2017-10-06 DIAGNOSIS — S0091XD Abrasion of unspecified part of head, subsequent encounter: Secondary | ICD-10-CM | POA: Diagnosis not present

## 2017-10-06 DIAGNOSIS — I10 Essential (primary) hypertension: Secondary | ICD-10-CM | POA: Diagnosis not present

## 2017-10-06 DIAGNOSIS — W19XXXD Unspecified fall, subsequent encounter: Secondary | ICD-10-CM | POA: Diagnosis not present

## 2017-10-06 DIAGNOSIS — F0390 Unspecified dementia without behavioral disturbance: Secondary | ICD-10-CM | POA: Diagnosis not present

## 2017-10-07 DIAGNOSIS — I2510 Atherosclerotic heart disease of native coronary artery without angina pectoris: Secondary | ICD-10-CM | POA: Diagnosis not present

## 2017-10-07 DIAGNOSIS — W19XXXD Unspecified fall, subsequent encounter: Secondary | ICD-10-CM | POA: Diagnosis not present

## 2017-10-07 DIAGNOSIS — S0091XD Abrasion of unspecified part of head, subsequent encounter: Secondary | ICD-10-CM | POA: Diagnosis not present

## 2017-10-07 DIAGNOSIS — F0390 Unspecified dementia without behavioral disturbance: Secondary | ICD-10-CM | POA: Diagnosis not present

## 2017-10-07 DIAGNOSIS — I10 Essential (primary) hypertension: Secondary | ICD-10-CM | POA: Diagnosis not present

## 2017-10-07 DIAGNOSIS — S41111D Laceration without foreign body of right upper arm, subsequent encounter: Secondary | ICD-10-CM | POA: Diagnosis not present

## 2017-10-08 DIAGNOSIS — F0390 Unspecified dementia without behavioral disturbance: Secondary | ICD-10-CM | POA: Diagnosis not present

## 2017-10-08 DIAGNOSIS — S0091XD Abrasion of unspecified part of head, subsequent encounter: Secondary | ICD-10-CM | POA: Diagnosis not present

## 2017-10-08 DIAGNOSIS — I2510 Atherosclerotic heart disease of native coronary artery without angina pectoris: Secondary | ICD-10-CM | POA: Diagnosis not present

## 2017-10-08 DIAGNOSIS — I10 Essential (primary) hypertension: Secondary | ICD-10-CM | POA: Diagnosis not present

## 2017-10-08 DIAGNOSIS — S41111D Laceration without foreign body of right upper arm, subsequent encounter: Secondary | ICD-10-CM | POA: Diagnosis not present

## 2017-10-08 DIAGNOSIS — W19XXXD Unspecified fall, subsequent encounter: Secondary | ICD-10-CM | POA: Diagnosis not present

## 2017-10-10 DIAGNOSIS — I2510 Atherosclerotic heart disease of native coronary artery without angina pectoris: Secondary | ICD-10-CM | POA: Diagnosis not present

## 2017-10-10 DIAGNOSIS — W19XXXD Unspecified fall, subsequent encounter: Secondary | ICD-10-CM | POA: Diagnosis not present

## 2017-10-10 DIAGNOSIS — S41111D Laceration without foreign body of right upper arm, subsequent encounter: Secondary | ICD-10-CM | POA: Diagnosis not present

## 2017-10-10 DIAGNOSIS — F0390 Unspecified dementia without behavioral disturbance: Secondary | ICD-10-CM | POA: Diagnosis not present

## 2017-10-10 DIAGNOSIS — I10 Essential (primary) hypertension: Secondary | ICD-10-CM | POA: Diagnosis not present

## 2017-10-10 DIAGNOSIS — S0091XD Abrasion of unspecified part of head, subsequent encounter: Secondary | ICD-10-CM | POA: Diagnosis not present

## 2017-10-12 DIAGNOSIS — I2510 Atherosclerotic heart disease of native coronary artery without angina pectoris: Secondary | ICD-10-CM | POA: Diagnosis not present

## 2017-10-12 DIAGNOSIS — W19XXXD Unspecified fall, subsequent encounter: Secondary | ICD-10-CM | POA: Diagnosis not present

## 2017-10-12 DIAGNOSIS — F0390 Unspecified dementia without behavioral disturbance: Secondary | ICD-10-CM | POA: Diagnosis not present

## 2017-10-12 DIAGNOSIS — S41111D Laceration without foreign body of right upper arm, subsequent encounter: Secondary | ICD-10-CM | POA: Diagnosis not present

## 2017-10-12 DIAGNOSIS — S0091XD Abrasion of unspecified part of head, subsequent encounter: Secondary | ICD-10-CM | POA: Diagnosis not present

## 2017-10-12 DIAGNOSIS — I10 Essential (primary) hypertension: Secondary | ICD-10-CM | POA: Diagnosis not present

## 2017-10-17 DIAGNOSIS — S41111D Laceration without foreign body of right upper arm, subsequent encounter: Secondary | ICD-10-CM | POA: Diagnosis not present

## 2017-10-17 DIAGNOSIS — S0091XD Abrasion of unspecified part of head, subsequent encounter: Secondary | ICD-10-CM | POA: Diagnosis not present

## 2017-10-17 DIAGNOSIS — I2510 Atherosclerotic heart disease of native coronary artery without angina pectoris: Secondary | ICD-10-CM | POA: Diagnosis not present

## 2017-10-17 DIAGNOSIS — W19XXXD Unspecified fall, subsequent encounter: Secondary | ICD-10-CM | POA: Diagnosis not present

## 2017-10-17 DIAGNOSIS — I10 Essential (primary) hypertension: Secondary | ICD-10-CM | POA: Diagnosis not present

## 2017-10-17 DIAGNOSIS — F0390 Unspecified dementia without behavioral disturbance: Secondary | ICD-10-CM | POA: Diagnosis not present

## 2017-10-18 DIAGNOSIS — I2510 Atherosclerotic heart disease of native coronary artery without angina pectoris: Secondary | ICD-10-CM | POA: Diagnosis not present

## 2017-10-18 DIAGNOSIS — S0091XD Abrasion of unspecified part of head, subsequent encounter: Secondary | ICD-10-CM | POA: Diagnosis not present

## 2017-10-18 DIAGNOSIS — I10 Essential (primary) hypertension: Secondary | ICD-10-CM | POA: Diagnosis not present

## 2017-10-18 DIAGNOSIS — W19XXXD Unspecified fall, subsequent encounter: Secondary | ICD-10-CM | POA: Diagnosis not present

## 2017-10-18 DIAGNOSIS — S41111D Laceration without foreign body of right upper arm, subsequent encounter: Secondary | ICD-10-CM | POA: Diagnosis not present

## 2017-10-18 DIAGNOSIS — F0390 Unspecified dementia without behavioral disturbance: Secondary | ICD-10-CM | POA: Diagnosis not present

## 2017-10-19 DIAGNOSIS — I2510 Atherosclerotic heart disease of native coronary artery without angina pectoris: Secondary | ICD-10-CM | POA: Diagnosis not present

## 2017-10-19 DIAGNOSIS — I10 Essential (primary) hypertension: Secondary | ICD-10-CM | POA: Diagnosis not present

## 2017-10-19 DIAGNOSIS — W19XXXD Unspecified fall, subsequent encounter: Secondary | ICD-10-CM | POA: Diagnosis not present

## 2017-10-19 DIAGNOSIS — S41111D Laceration without foreign body of right upper arm, subsequent encounter: Secondary | ICD-10-CM | POA: Diagnosis not present

## 2017-10-19 DIAGNOSIS — S0091XD Abrasion of unspecified part of head, subsequent encounter: Secondary | ICD-10-CM | POA: Diagnosis not present

## 2017-10-19 DIAGNOSIS — F0390 Unspecified dementia without behavioral disturbance: Secondary | ICD-10-CM | POA: Diagnosis not present

## 2017-10-21 DIAGNOSIS — I10 Essential (primary) hypertension: Secondary | ICD-10-CM | POA: Diagnosis not present

## 2017-10-21 DIAGNOSIS — W19XXXD Unspecified fall, subsequent encounter: Secondary | ICD-10-CM | POA: Diagnosis not present

## 2017-10-21 DIAGNOSIS — S0091XD Abrasion of unspecified part of head, subsequent encounter: Secondary | ICD-10-CM | POA: Diagnosis not present

## 2017-10-21 DIAGNOSIS — F0390 Unspecified dementia without behavioral disturbance: Secondary | ICD-10-CM | POA: Diagnosis not present

## 2017-10-21 DIAGNOSIS — S41111D Laceration without foreign body of right upper arm, subsequent encounter: Secondary | ICD-10-CM | POA: Diagnosis not present

## 2017-10-21 DIAGNOSIS — I2510 Atherosclerotic heart disease of native coronary artery without angina pectoris: Secondary | ICD-10-CM | POA: Diagnosis not present

## 2017-10-31 DIAGNOSIS — R05 Cough: Secondary | ICD-10-CM | POA: Diagnosis not present

## 2017-10-31 DIAGNOSIS — R410 Disorientation, unspecified: Secondary | ICD-10-CM | POA: Diagnosis not present

## 2017-10-31 DIAGNOSIS — J322 Chronic ethmoidal sinusitis: Secondary | ICD-10-CM | POA: Diagnosis not present

## 2017-10-31 DIAGNOSIS — N390 Urinary tract infection, site not specified: Secondary | ICD-10-CM | POA: Diagnosis not present

## 2017-11-01 DIAGNOSIS — N390 Urinary tract infection, site not specified: Secondary | ICD-10-CM | POA: Diagnosis not present

## 2017-11-01 DIAGNOSIS — N401 Enlarged prostate with lower urinary tract symptoms: Secondary | ICD-10-CM | POA: Diagnosis not present

## 2017-11-01 DIAGNOSIS — R410 Disorientation, unspecified: Secondary | ICD-10-CM | POA: Diagnosis not present

## 2017-11-18 DIAGNOSIS — I2510 Atherosclerotic heart disease of native coronary artery without angina pectoris: Secondary | ICD-10-CM | POA: Diagnosis not present

## 2017-11-18 DIAGNOSIS — F0390 Unspecified dementia without behavioral disturbance: Secondary | ICD-10-CM | POA: Diagnosis not present

## 2017-11-18 DIAGNOSIS — D649 Anemia, unspecified: Secondary | ICD-10-CM | POA: Diagnosis not present

## 2017-11-18 DIAGNOSIS — S41101A Unspecified open wound of right upper arm, initial encounter: Secondary | ICD-10-CM | POA: Diagnosis not present

## 2017-11-18 DIAGNOSIS — Z9181 History of falling: Secondary | ICD-10-CM | POA: Diagnosis not present

## 2017-11-18 DIAGNOSIS — I10 Essential (primary) hypertension: Secondary | ICD-10-CM | POA: Diagnosis not present

## 2017-11-19 DIAGNOSIS — I10 Essential (primary) hypertension: Secondary | ICD-10-CM | POA: Diagnosis not present

## 2017-11-19 DIAGNOSIS — Z9181 History of falling: Secondary | ICD-10-CM | POA: Diagnosis not present

## 2017-11-19 DIAGNOSIS — D649 Anemia, unspecified: Secondary | ICD-10-CM | POA: Diagnosis not present

## 2017-11-19 DIAGNOSIS — F0390 Unspecified dementia without behavioral disturbance: Secondary | ICD-10-CM | POA: Diagnosis not present

## 2017-11-19 DIAGNOSIS — I2510 Atherosclerotic heart disease of native coronary artery without angina pectoris: Secondary | ICD-10-CM | POA: Diagnosis not present

## 2017-11-19 DIAGNOSIS — S41101A Unspecified open wound of right upper arm, initial encounter: Secondary | ICD-10-CM | POA: Diagnosis not present

## 2017-11-20 DIAGNOSIS — I2510 Atherosclerotic heart disease of native coronary artery without angina pectoris: Secondary | ICD-10-CM | POA: Diagnosis not present

## 2017-11-20 DIAGNOSIS — S41101A Unspecified open wound of right upper arm, initial encounter: Secondary | ICD-10-CM | POA: Diagnosis not present

## 2017-11-20 DIAGNOSIS — F0390 Unspecified dementia without behavioral disturbance: Secondary | ICD-10-CM | POA: Diagnosis not present

## 2017-11-20 DIAGNOSIS — I10 Essential (primary) hypertension: Secondary | ICD-10-CM | POA: Diagnosis not present

## 2017-11-20 DIAGNOSIS — Z9181 History of falling: Secondary | ICD-10-CM | POA: Diagnosis not present

## 2017-11-20 DIAGNOSIS — D649 Anemia, unspecified: Secondary | ICD-10-CM | POA: Diagnosis not present

## 2017-11-21 DIAGNOSIS — S41101A Unspecified open wound of right upper arm, initial encounter: Secondary | ICD-10-CM | POA: Diagnosis not present

## 2017-11-21 DIAGNOSIS — I2510 Atherosclerotic heart disease of native coronary artery without angina pectoris: Secondary | ICD-10-CM | POA: Diagnosis not present

## 2017-11-21 DIAGNOSIS — I10 Essential (primary) hypertension: Secondary | ICD-10-CM | POA: Diagnosis not present

## 2017-11-21 DIAGNOSIS — F0390 Unspecified dementia without behavioral disturbance: Secondary | ICD-10-CM | POA: Diagnosis not present

## 2017-11-21 DIAGNOSIS — D649 Anemia, unspecified: Secondary | ICD-10-CM | POA: Diagnosis not present

## 2017-11-21 DIAGNOSIS — Z9181 History of falling: Secondary | ICD-10-CM | POA: Diagnosis not present

## 2017-11-23 DIAGNOSIS — F0390 Unspecified dementia without behavioral disturbance: Secondary | ICD-10-CM | POA: Diagnosis not present

## 2017-11-23 DIAGNOSIS — I10 Essential (primary) hypertension: Secondary | ICD-10-CM | POA: Diagnosis not present

## 2017-11-23 DIAGNOSIS — D649 Anemia, unspecified: Secondary | ICD-10-CM | POA: Diagnosis not present

## 2017-11-23 DIAGNOSIS — S41101A Unspecified open wound of right upper arm, initial encounter: Secondary | ICD-10-CM | POA: Diagnosis not present

## 2017-11-23 DIAGNOSIS — I2510 Atherosclerotic heart disease of native coronary artery without angina pectoris: Secondary | ICD-10-CM | POA: Diagnosis not present

## 2017-11-23 DIAGNOSIS — Z9181 History of falling: Secondary | ICD-10-CM | POA: Diagnosis not present

## 2017-11-25 DIAGNOSIS — I2510 Atherosclerotic heart disease of native coronary artery without angina pectoris: Secondary | ICD-10-CM | POA: Diagnosis not present

## 2017-11-25 DIAGNOSIS — Z9181 History of falling: Secondary | ICD-10-CM | POA: Diagnosis not present

## 2017-11-25 DIAGNOSIS — S41101A Unspecified open wound of right upper arm, initial encounter: Secondary | ICD-10-CM | POA: Diagnosis not present

## 2017-11-25 DIAGNOSIS — I10 Essential (primary) hypertension: Secondary | ICD-10-CM | POA: Diagnosis not present

## 2017-11-25 DIAGNOSIS — D649 Anemia, unspecified: Secondary | ICD-10-CM | POA: Diagnosis not present

## 2017-11-25 DIAGNOSIS — F0390 Unspecified dementia without behavioral disturbance: Secondary | ICD-10-CM | POA: Diagnosis not present

## 2017-11-28 DIAGNOSIS — Z9181 History of falling: Secondary | ICD-10-CM | POA: Diagnosis not present

## 2017-11-28 DIAGNOSIS — I2510 Atherosclerotic heart disease of native coronary artery without angina pectoris: Secondary | ICD-10-CM | POA: Diagnosis not present

## 2017-11-28 DIAGNOSIS — D649 Anemia, unspecified: Secondary | ICD-10-CM | POA: Diagnosis not present

## 2017-11-28 DIAGNOSIS — F0390 Unspecified dementia without behavioral disturbance: Secondary | ICD-10-CM | POA: Diagnosis not present

## 2017-11-28 DIAGNOSIS — I10 Essential (primary) hypertension: Secondary | ICD-10-CM | POA: Diagnosis not present

## 2017-11-28 DIAGNOSIS — S41101A Unspecified open wound of right upper arm, initial encounter: Secondary | ICD-10-CM | POA: Diagnosis not present

## 2017-11-29 DIAGNOSIS — Z9181 History of falling: Secondary | ICD-10-CM | POA: Diagnosis not present

## 2017-11-29 DIAGNOSIS — D649 Anemia, unspecified: Secondary | ICD-10-CM | POA: Diagnosis not present

## 2017-11-29 DIAGNOSIS — S41101A Unspecified open wound of right upper arm, initial encounter: Secondary | ICD-10-CM | POA: Diagnosis not present

## 2017-11-29 DIAGNOSIS — I10 Essential (primary) hypertension: Secondary | ICD-10-CM | POA: Diagnosis not present

## 2017-11-29 DIAGNOSIS — I2510 Atherosclerotic heart disease of native coronary artery without angina pectoris: Secondary | ICD-10-CM | POA: Diagnosis not present

## 2017-11-29 DIAGNOSIS — F0390 Unspecified dementia without behavioral disturbance: Secondary | ICD-10-CM | POA: Diagnosis not present

## 2017-12-05 DIAGNOSIS — I10 Essential (primary) hypertension: Secondary | ICD-10-CM | POA: Diagnosis not present

## 2017-12-05 DIAGNOSIS — F0390 Unspecified dementia without behavioral disturbance: Secondary | ICD-10-CM | POA: Diagnosis not present

## 2017-12-05 DIAGNOSIS — I2510 Atherosclerotic heart disease of native coronary artery without angina pectoris: Secondary | ICD-10-CM | POA: Diagnosis not present

## 2017-12-05 DIAGNOSIS — Z9181 History of falling: Secondary | ICD-10-CM | POA: Diagnosis not present

## 2017-12-05 DIAGNOSIS — D649 Anemia, unspecified: Secondary | ICD-10-CM | POA: Diagnosis not present

## 2017-12-05 DIAGNOSIS — S41101A Unspecified open wound of right upper arm, initial encounter: Secondary | ICD-10-CM | POA: Diagnosis not present

## 2017-12-07 DIAGNOSIS — D649 Anemia, unspecified: Secondary | ICD-10-CM | POA: Diagnosis not present

## 2017-12-07 DIAGNOSIS — Z9181 History of falling: Secondary | ICD-10-CM | POA: Diagnosis not present

## 2017-12-07 DIAGNOSIS — F0390 Unspecified dementia without behavioral disturbance: Secondary | ICD-10-CM | POA: Diagnosis not present

## 2017-12-07 DIAGNOSIS — I2510 Atherosclerotic heart disease of native coronary artery without angina pectoris: Secondary | ICD-10-CM | POA: Diagnosis not present

## 2017-12-07 DIAGNOSIS — S41101A Unspecified open wound of right upper arm, initial encounter: Secondary | ICD-10-CM | POA: Diagnosis not present

## 2017-12-07 DIAGNOSIS — I10 Essential (primary) hypertension: Secondary | ICD-10-CM | POA: Diagnosis not present

## 2017-12-12 DIAGNOSIS — D649 Anemia, unspecified: Secondary | ICD-10-CM | POA: Diagnosis not present

## 2017-12-12 DIAGNOSIS — I2510 Atherosclerotic heart disease of native coronary artery without angina pectoris: Secondary | ICD-10-CM | POA: Diagnosis not present

## 2017-12-12 DIAGNOSIS — I10 Essential (primary) hypertension: Secondary | ICD-10-CM | POA: Diagnosis not present

## 2017-12-12 DIAGNOSIS — Z9181 History of falling: Secondary | ICD-10-CM | POA: Diagnosis not present

## 2017-12-12 DIAGNOSIS — S41101A Unspecified open wound of right upper arm, initial encounter: Secondary | ICD-10-CM | POA: Diagnosis not present

## 2017-12-12 DIAGNOSIS — F0390 Unspecified dementia without behavioral disturbance: Secondary | ICD-10-CM | POA: Diagnosis not present

## 2017-12-13 DIAGNOSIS — I10 Essential (primary) hypertension: Secondary | ICD-10-CM | POA: Diagnosis not present

## 2017-12-13 DIAGNOSIS — Z9181 History of falling: Secondary | ICD-10-CM | POA: Diagnosis not present

## 2017-12-13 DIAGNOSIS — I2510 Atherosclerotic heart disease of native coronary artery without angina pectoris: Secondary | ICD-10-CM | POA: Diagnosis not present

## 2017-12-13 DIAGNOSIS — S41101A Unspecified open wound of right upper arm, initial encounter: Secondary | ICD-10-CM | POA: Diagnosis not present

## 2017-12-13 DIAGNOSIS — F0390 Unspecified dementia without behavioral disturbance: Secondary | ICD-10-CM | POA: Diagnosis not present

## 2017-12-13 DIAGNOSIS — D649 Anemia, unspecified: Secondary | ICD-10-CM | POA: Diagnosis not present

## 2017-12-15 DIAGNOSIS — S0091XD Abrasion of unspecified part of head, subsequent encounter: Secondary | ICD-10-CM | POA: Diagnosis not present

## 2017-12-15 DIAGNOSIS — S41111D Laceration without foreign body of right upper arm, subsequent encounter: Secondary | ICD-10-CM | POA: Diagnosis not present

## 2017-12-15 DIAGNOSIS — I10 Essential (primary) hypertension: Secondary | ICD-10-CM | POA: Diagnosis not present

## 2017-12-15 DIAGNOSIS — W19XXXD Unspecified fall, subsequent encounter: Secondary | ICD-10-CM | POA: Diagnosis not present

## 2017-12-20 DIAGNOSIS — H903 Sensorineural hearing loss, bilateral: Secondary | ICD-10-CM | POA: Diagnosis not present

## 2017-12-20 DIAGNOSIS — H6123 Impacted cerumen, bilateral: Secondary | ICD-10-CM | POA: Diagnosis not present

## 2017-12-21 DIAGNOSIS — F0390 Unspecified dementia without behavioral disturbance: Secondary | ICD-10-CM | POA: Diagnosis not present

## 2017-12-21 DIAGNOSIS — I10 Essential (primary) hypertension: Secondary | ICD-10-CM | POA: Diagnosis not present

## 2017-12-21 DIAGNOSIS — D649 Anemia, unspecified: Secondary | ICD-10-CM | POA: Diagnosis not present

## 2017-12-21 DIAGNOSIS — I2510 Atherosclerotic heart disease of native coronary artery without angina pectoris: Secondary | ICD-10-CM | POA: Diagnosis not present

## 2017-12-21 DIAGNOSIS — S41101A Unspecified open wound of right upper arm, initial encounter: Secondary | ICD-10-CM | POA: Diagnosis not present

## 2017-12-21 DIAGNOSIS — Z9181 History of falling: Secondary | ICD-10-CM | POA: Diagnosis not present

## 2017-12-22 DIAGNOSIS — I10 Essential (primary) hypertension: Secondary | ICD-10-CM | POA: Diagnosis not present

## 2017-12-22 DIAGNOSIS — I2510 Atherosclerotic heart disease of native coronary artery without angina pectoris: Secondary | ICD-10-CM | POA: Diagnosis not present

## 2017-12-22 DIAGNOSIS — F0390 Unspecified dementia without behavioral disturbance: Secondary | ICD-10-CM | POA: Diagnosis not present

## 2017-12-22 DIAGNOSIS — S41101A Unspecified open wound of right upper arm, initial encounter: Secondary | ICD-10-CM | POA: Diagnosis not present

## 2017-12-22 DIAGNOSIS — Z9181 History of falling: Secondary | ICD-10-CM | POA: Diagnosis not present

## 2017-12-22 DIAGNOSIS — D649 Anemia, unspecified: Secondary | ICD-10-CM | POA: Diagnosis not present

## 2017-12-26 DIAGNOSIS — C44329 Squamous cell carcinoma of skin of other parts of face: Secondary | ICD-10-CM | POA: Diagnosis not present

## 2017-12-26 DIAGNOSIS — D485 Neoplasm of uncertain behavior of skin: Secondary | ICD-10-CM | POA: Diagnosis not present

## 2017-12-26 DIAGNOSIS — X32XXXA Exposure to sunlight, initial encounter: Secondary | ICD-10-CM | POA: Diagnosis not present

## 2017-12-26 DIAGNOSIS — L821 Other seborrheic keratosis: Secondary | ICD-10-CM | POA: Diagnosis not present

## 2017-12-26 DIAGNOSIS — D044 Carcinoma in situ of skin of scalp and neck: Secondary | ICD-10-CM | POA: Diagnosis not present

## 2017-12-26 DIAGNOSIS — L570 Actinic keratosis: Secondary | ICD-10-CM | POA: Diagnosis not present

## 2017-12-27 DIAGNOSIS — Z9181 History of falling: Secondary | ICD-10-CM | POA: Diagnosis not present

## 2017-12-27 DIAGNOSIS — F0390 Unspecified dementia without behavioral disturbance: Secondary | ICD-10-CM | POA: Diagnosis not present

## 2017-12-27 DIAGNOSIS — I10 Essential (primary) hypertension: Secondary | ICD-10-CM | POA: Diagnosis not present

## 2017-12-27 DIAGNOSIS — S41101A Unspecified open wound of right upper arm, initial encounter: Secondary | ICD-10-CM | POA: Diagnosis not present

## 2017-12-27 DIAGNOSIS — D649 Anemia, unspecified: Secondary | ICD-10-CM | POA: Diagnosis not present

## 2017-12-27 DIAGNOSIS — I2510 Atherosclerotic heart disease of native coronary artery without angina pectoris: Secondary | ICD-10-CM | POA: Diagnosis not present

## 2017-12-29 DIAGNOSIS — F0390 Unspecified dementia without behavioral disturbance: Secondary | ICD-10-CM | POA: Diagnosis not present

## 2017-12-29 DIAGNOSIS — Z9181 History of falling: Secondary | ICD-10-CM | POA: Diagnosis not present

## 2017-12-29 DIAGNOSIS — I10 Essential (primary) hypertension: Secondary | ICD-10-CM | POA: Diagnosis not present

## 2017-12-29 DIAGNOSIS — D649 Anemia, unspecified: Secondary | ICD-10-CM | POA: Diagnosis not present

## 2017-12-29 DIAGNOSIS — S41101A Unspecified open wound of right upper arm, initial encounter: Secondary | ICD-10-CM | POA: Diagnosis not present

## 2017-12-29 DIAGNOSIS — I2510 Atherosclerotic heart disease of native coronary artery without angina pectoris: Secondary | ICD-10-CM | POA: Diagnosis not present

## 2017-12-30 DIAGNOSIS — C44320 Squamous cell carcinoma of skin of unspecified parts of face: Secondary | ICD-10-CM | POA: Diagnosis not present

## 2017-12-30 DIAGNOSIS — D044 Carcinoma in situ of skin of scalp and neck: Secondary | ICD-10-CM | POA: Diagnosis not present

## 2017-12-30 DIAGNOSIS — C44329 Squamous cell carcinoma of skin of other parts of face: Secondary | ICD-10-CM | POA: Diagnosis not present

## 2018-01-03 DIAGNOSIS — R05 Cough: Secondary | ICD-10-CM | POA: Diagnosis not present

## 2018-01-04 DIAGNOSIS — I10 Essential (primary) hypertension: Secondary | ICD-10-CM | POA: Diagnosis not present

## 2018-01-04 DIAGNOSIS — Z9181 History of falling: Secondary | ICD-10-CM | POA: Diagnosis not present

## 2018-01-04 DIAGNOSIS — F0390 Unspecified dementia without behavioral disturbance: Secondary | ICD-10-CM | POA: Diagnosis not present

## 2018-01-04 DIAGNOSIS — I2510 Atherosclerotic heart disease of native coronary artery without angina pectoris: Secondary | ICD-10-CM | POA: Diagnosis not present

## 2018-01-04 DIAGNOSIS — D649 Anemia, unspecified: Secondary | ICD-10-CM | POA: Diagnosis not present

## 2018-01-04 DIAGNOSIS — S41101A Unspecified open wound of right upper arm, initial encounter: Secondary | ICD-10-CM | POA: Diagnosis not present

## 2018-01-05 DIAGNOSIS — F0390 Unspecified dementia without behavioral disturbance: Secondary | ICD-10-CM | POA: Diagnosis not present

## 2018-01-05 DIAGNOSIS — I2510 Atherosclerotic heart disease of native coronary artery without angina pectoris: Secondary | ICD-10-CM | POA: Diagnosis not present

## 2018-01-05 DIAGNOSIS — S41101A Unspecified open wound of right upper arm, initial encounter: Secondary | ICD-10-CM | POA: Diagnosis not present

## 2018-01-05 DIAGNOSIS — Z9181 History of falling: Secondary | ICD-10-CM | POA: Diagnosis not present

## 2018-01-05 DIAGNOSIS — D649 Anemia, unspecified: Secondary | ICD-10-CM | POA: Diagnosis not present

## 2018-01-05 DIAGNOSIS — I10 Essential (primary) hypertension: Secondary | ICD-10-CM | POA: Diagnosis not present

## 2018-01-11 DIAGNOSIS — I10 Essential (primary) hypertension: Secondary | ICD-10-CM | POA: Diagnosis not present

## 2018-01-11 DIAGNOSIS — Z9181 History of falling: Secondary | ICD-10-CM | POA: Diagnosis not present

## 2018-01-11 DIAGNOSIS — F0390 Unspecified dementia without behavioral disturbance: Secondary | ICD-10-CM | POA: Diagnosis not present

## 2018-01-11 DIAGNOSIS — I2510 Atherosclerotic heart disease of native coronary artery without angina pectoris: Secondary | ICD-10-CM | POA: Diagnosis not present

## 2018-01-11 DIAGNOSIS — D649 Anemia, unspecified: Secondary | ICD-10-CM | POA: Diagnosis not present

## 2018-01-11 DIAGNOSIS — S41101A Unspecified open wound of right upper arm, initial encounter: Secondary | ICD-10-CM | POA: Diagnosis not present

## 2018-01-13 DIAGNOSIS — D649 Anemia, unspecified: Secondary | ICD-10-CM | POA: Diagnosis not present

## 2018-01-13 DIAGNOSIS — I10 Essential (primary) hypertension: Secondary | ICD-10-CM | POA: Diagnosis not present

## 2018-01-13 DIAGNOSIS — Z9181 History of falling: Secondary | ICD-10-CM | POA: Diagnosis not present

## 2018-01-13 DIAGNOSIS — F0390 Unspecified dementia without behavioral disturbance: Secondary | ICD-10-CM | POA: Diagnosis not present

## 2018-01-13 DIAGNOSIS — S41101A Unspecified open wound of right upper arm, initial encounter: Secondary | ICD-10-CM | POA: Diagnosis not present

## 2018-01-13 DIAGNOSIS — I2510 Atherosclerotic heart disease of native coronary artery without angina pectoris: Secondary | ICD-10-CM | POA: Diagnosis not present

## 2018-01-16 DIAGNOSIS — I10 Essential (primary) hypertension: Secondary | ICD-10-CM | POA: Diagnosis not present

## 2018-01-16 DIAGNOSIS — I2510 Atherosclerotic heart disease of native coronary artery without angina pectoris: Secondary | ICD-10-CM | POA: Diagnosis not present

## 2018-01-16 DIAGNOSIS — S41101A Unspecified open wound of right upper arm, initial encounter: Secondary | ICD-10-CM | POA: Diagnosis not present

## 2018-01-16 DIAGNOSIS — Z9181 History of falling: Secondary | ICD-10-CM | POA: Diagnosis not present

## 2018-01-16 DIAGNOSIS — F0390 Unspecified dementia without behavioral disturbance: Secondary | ICD-10-CM | POA: Diagnosis not present

## 2018-01-16 DIAGNOSIS — D649 Anemia, unspecified: Secondary | ICD-10-CM | POA: Diagnosis not present

## 2018-01-17 DIAGNOSIS — F0390 Unspecified dementia without behavioral disturbance: Secondary | ICD-10-CM | POA: Diagnosis not present

## 2018-01-17 DIAGNOSIS — D649 Anemia, unspecified: Secondary | ICD-10-CM | POA: Diagnosis not present

## 2018-01-17 DIAGNOSIS — W19XXXD Unspecified fall, subsequent encounter: Secondary | ICD-10-CM | POA: Diagnosis not present

## 2018-01-17 DIAGNOSIS — I2510 Atherosclerotic heart disease of native coronary artery without angina pectoris: Secondary | ICD-10-CM | POA: Diagnosis not present

## 2018-01-17 DIAGNOSIS — Z7982 Long term (current) use of aspirin: Secondary | ICD-10-CM | POA: Diagnosis not present

## 2018-01-17 DIAGNOSIS — I10 Essential (primary) hypertension: Secondary | ICD-10-CM | POA: Diagnosis not present

## 2018-02-10 DIAGNOSIS — R5383 Other fatigue: Secondary | ICD-10-CM | POA: Diagnosis not present

## 2018-02-10 DIAGNOSIS — E785 Hyperlipidemia, unspecified: Secondary | ICD-10-CM | POA: Diagnosis not present

## 2018-02-10 DIAGNOSIS — D649 Anemia, unspecified: Secondary | ICD-10-CM | POA: Diagnosis not present

## 2018-02-10 DIAGNOSIS — R319 Hematuria, unspecified: Secondary | ICD-10-CM | POA: Diagnosis not present

## 2018-02-10 DIAGNOSIS — N390 Urinary tract infection, site not specified: Secondary | ICD-10-CM | POA: Diagnosis not present

## 2018-02-20 DIAGNOSIS — D649 Anemia, unspecified: Secondary | ICD-10-CM | POA: Diagnosis present

## 2018-02-20 DIAGNOSIS — E0590 Thyrotoxicosis, unspecified without thyrotoxic crisis or storm: Secondary | ICD-10-CM | POA: Diagnosis present

## 2018-02-20 DIAGNOSIS — R05 Cough: Secondary | ICD-10-CM | POA: Diagnosis not present

## 2018-02-20 DIAGNOSIS — F419 Anxiety disorder, unspecified: Secondary | ICD-10-CM | POA: Diagnosis present

## 2018-02-20 DIAGNOSIS — F410 Panic disorder [episodic paroxysmal anxiety] without agoraphobia: Secondary | ICD-10-CM | POA: Diagnosis not present

## 2018-02-20 DIAGNOSIS — I2510 Atherosclerotic heart disease of native coronary artery without angina pectoris: Secondary | ICD-10-CM | POA: Diagnosis present

## 2018-02-20 DIAGNOSIS — Z923 Personal history of irradiation: Secondary | ICD-10-CM | POA: Diagnosis not present

## 2018-02-20 DIAGNOSIS — R197 Diarrhea, unspecified: Secondary | ICD-10-CM | POA: Diagnosis not present

## 2018-02-20 DIAGNOSIS — G4700 Insomnia, unspecified: Secondary | ICD-10-CM | POA: Diagnosis present

## 2018-02-20 DIAGNOSIS — Z9049 Acquired absence of other specified parts of digestive tract: Secondary | ICD-10-CM | POA: Diagnosis not present

## 2018-02-20 DIAGNOSIS — Z79899 Other long term (current) drug therapy: Secondary | ICD-10-CM | POA: Diagnosis not present

## 2018-02-20 DIAGNOSIS — Z8546 Personal history of malignant neoplasm of prostate: Secondary | ICD-10-CM | POA: Diagnosis not present

## 2018-02-20 DIAGNOSIS — I10 Essential (primary) hypertension: Secondary | ICD-10-CM | POA: Diagnosis present

## 2018-02-20 DIAGNOSIS — R1110 Vomiting, unspecified: Secondary | ICD-10-CM | POA: Diagnosis not present

## 2018-02-20 DIAGNOSIS — Z955 Presence of coronary angioplasty implant and graft: Secondary | ICD-10-CM | POA: Diagnosis not present

## 2018-02-20 DIAGNOSIS — K529 Noninfective gastroenteritis and colitis, unspecified: Secondary | ICD-10-CM | POA: Diagnosis present

## 2018-02-20 DIAGNOSIS — E876 Hypokalemia: Secondary | ICD-10-CM | POA: Diagnosis not present

## 2018-02-20 DIAGNOSIS — F329 Major depressive disorder, single episode, unspecified: Secondary | ICD-10-CM | POA: Diagnosis not present

## 2018-02-20 DIAGNOSIS — E860 Dehydration: Secondary | ICD-10-CM | POA: Diagnosis not present

## 2018-02-20 DIAGNOSIS — G9340 Encephalopathy, unspecified: Secondary | ICD-10-CM | POA: Diagnosis not present

## 2018-03-07 DIAGNOSIS — I70213 Atherosclerosis of native arteries of extremities with intermittent claudication, bilateral legs: Secondary | ICD-10-CM | POA: Diagnosis not present

## 2018-03-07 DIAGNOSIS — R600 Localized edema: Secondary | ICD-10-CM | POA: Diagnosis not present

## 2018-03-07 DIAGNOSIS — B353 Tinea pedis: Secondary | ICD-10-CM | POA: Diagnosis not present
